# Patient Record
Sex: Male | Born: 1950 | Race: White | Hispanic: No | Marital: Married | Smoking: Never smoker
Health system: Southern US, Community
[De-identification: ages and names within clinical notes are randomized; demographics above are authoritative.]

## PROBLEM LIST (undated history)

## (undated) DIAGNOSIS — G4733 Obstructive sleep apnea (adult) (pediatric): Secondary | ICD-10-CM

## (undated) DIAGNOSIS — N529 Male erectile dysfunction, unspecified: Secondary | ICD-10-CM

## (undated) DIAGNOSIS — K635 Polyp of colon: Secondary | ICD-10-CM

## (undated) DIAGNOSIS — I712 Thoracic aortic aneurysm, without rupture: Secondary | ICD-10-CM

## (undated) DIAGNOSIS — I4891 Unspecified atrial fibrillation: Secondary | ICD-10-CM

## (undated) DIAGNOSIS — R55 Syncope and collapse: Secondary | ICD-10-CM

## (undated) DIAGNOSIS — T8859XA Other complications of anesthesia, initial encounter: Secondary | ICD-10-CM

## (undated) DIAGNOSIS — I82409 Acute embolism and thrombosis of unspecified deep veins of unspecified lower extremity: Secondary | ICD-10-CM

## (undated) DIAGNOSIS — T4145XA Adverse effect of unspecified anesthetic, initial encounter: Secondary | ICD-10-CM

## (undated) DIAGNOSIS — I700 Atherosclerosis of aorta: Secondary | ICD-10-CM

## (undated) DIAGNOSIS — K219 Gastro-esophageal reflux disease without esophagitis: Secondary | ICD-10-CM

## (undated) DIAGNOSIS — C61 Malignant neoplasm of prostate: Secondary | ICD-10-CM

## (undated) DIAGNOSIS — G4730 Sleep apnea, unspecified: Secondary | ICD-10-CM

## (undated) DIAGNOSIS — G4710 Hypersomnia, unspecified: Secondary | ICD-10-CM

## (undated) DIAGNOSIS — M542 Cervicalgia: Secondary | ICD-10-CM

## (undated) HISTORY — DX: Thoracic aortic aneurysm, without rupture: I71.2

## (undated) HISTORY — DX: Adverse effect of unspecified anesthetic, initial encounter: T41.45XA

## (undated) HISTORY — DX: Hyperlipidemia, unspecified: E78.5

## (undated) HISTORY — DX: Malignant neoplasm of prostate: C61

## (undated) HISTORY — DX: Hypersomnia, unspecified: G47.10

## (undated) HISTORY — DX: Syncope and collapse: R55

## (undated) HISTORY — DX: Sleep apnea, unspecified: G47.30

## (undated) HISTORY — DX: Essential (primary) hypertension: I10

## (undated) HISTORY — PX: OTHER SURGICAL HISTORY: SHX169

## (undated) HISTORY — PX: POLYPECTOMY: SHX149

## (undated) HISTORY — PX: COLONOSCOPY: SHX174

## (undated) HISTORY — DX: Other complications of anesthesia, initial encounter: T88.59XA

## (undated) HISTORY — PX: UPPER GASTROINTESTINAL ENDOSCOPY: SHX188

## (undated) HISTORY — DX: Atherosclerosis of aorta: I70.0

## (undated) HISTORY — DX: Male erectile dysfunction, unspecified: N52.9

## (undated) HISTORY — DX: Unspecified atrial fibrillation: I48.91

## (undated) HISTORY — PX: PLANTAR FASCIA SURGERY: SHX746

## (undated) HISTORY — DX: Cervicalgia: M54.2

## (undated) HISTORY — DX: Acute embolism and thrombosis of unspecified deep veins of unspecified lower extremity: I82.409

## (undated) HISTORY — DX: Obstructive sleep apnea (adult) (pediatric): G47.33

## (undated) HISTORY — DX: Polyp of colon: K63.5

---

## 1998-01-29 ENCOUNTER — Ambulatory Visit (HOSPITAL_COMMUNITY): Admission: RE | Admit: 1998-01-29 | Discharge: 1998-01-29 | Payer: Self-pay | Admitting: Family Medicine

## 2000-07-19 ENCOUNTER — Encounter: Payer: Self-pay | Admitting: Family Medicine

## 2000-07-19 LAB — CONVERTED CEMR LAB
Blood Glucose, Fasting: 96 mg/dL
PSA: 2 ng/mL
TSH: 1.73 microintl units/mL

## 2000-10-02 ENCOUNTER — Encounter (INDEPENDENT_AMBULATORY_CARE_PROVIDER_SITE_OTHER): Payer: Self-pay

## 2000-10-02 ENCOUNTER — Ambulatory Visit (HOSPITAL_COMMUNITY): Admission: RE | Admit: 2000-10-02 | Discharge: 2000-10-02 | Payer: Self-pay | Admitting: Gastroenterology

## 2000-10-02 ENCOUNTER — Other Ambulatory Visit: Admission: RE | Admit: 2000-10-02 | Discharge: 2000-10-02 | Payer: Self-pay | Admitting: Gastroenterology

## 2000-10-02 ENCOUNTER — Encounter: Payer: Self-pay | Admitting: Gastroenterology

## 2000-10-02 HISTORY — PX: ESOPHAGOGASTRODUODENOSCOPY: SHX1529

## 2003-07-24 ENCOUNTER — Observation Stay (HOSPITAL_COMMUNITY): Admission: EM | Admit: 2003-07-24 | Discharge: 2003-07-25 | Payer: Self-pay | Admitting: Emergency Medicine

## 2003-09-24 ENCOUNTER — Encounter: Payer: Self-pay | Admitting: Family Medicine

## 2003-09-24 LAB — CONVERTED CEMR LAB
Blood Glucose, Fasting: 88 mg/dL
PSA: 1.6 ng/mL
RBC count: 4.97 10*6/uL
TSH: 2.03 microintl units/mL
WBC, blood: 7.6 10*3/uL

## 2004-06-25 ENCOUNTER — Ambulatory Visit: Payer: Self-pay | Admitting: Family Medicine

## 2004-08-18 ENCOUNTER — Ambulatory Visit: Payer: Self-pay | Admitting: Family Medicine

## 2004-12-06 ENCOUNTER — Emergency Department (HOSPITAL_COMMUNITY): Admission: EM | Admit: 2004-12-06 | Discharge: 2004-12-06 | Payer: Self-pay | Admitting: Emergency Medicine

## 2004-12-06 IMAGING — CT CT ANGIO CHEST
1 of 3 series · 19 of 32 positions shown · IV contrast (APPLIED)
Comparison: Chest x-ray on the same day.  
 CT ANGIOGRAPHY OF CHEST:

CLINICAL DATA: 53-year-old with chest pain.  Evaluate for thoracic dissection.
TECHNIQUE: Multidetector CT imaging of the chest was performed during bolus injection of intravenous contrast.  Multiplanar CT angiographic image reconstructions were generated to evaluate the vascular anatomy.
 Contrast:  80 cc Omnipaque 300
 Vessels are well opacified and there is no evidence for a thoracic dissection or aneurysm.  The pulmonary arteries are also well opacified and there is no evidence for pulmonary embolus.  There is no mediastinal, hilar or axillary adenopathy.  Lung windows show no nodules, pleural effusions or infiltrates.  There is minimal bibasilar atelectasis but no evidence for interstitial fibrosis. The findings on the recent chest x-ray may be related to hypoinflation.  Images of the upper abdomen are unremarkable.

[Series 5: chest 1.0 b20f st · axial · 0.68mm/px · z∈[+937,+1210]mm · 19 of 300 slices shown]
[im 14/300  lung]
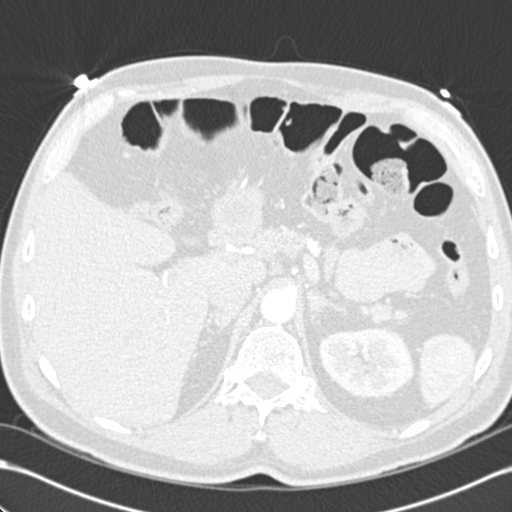
[im 27/300  soft-tissue]
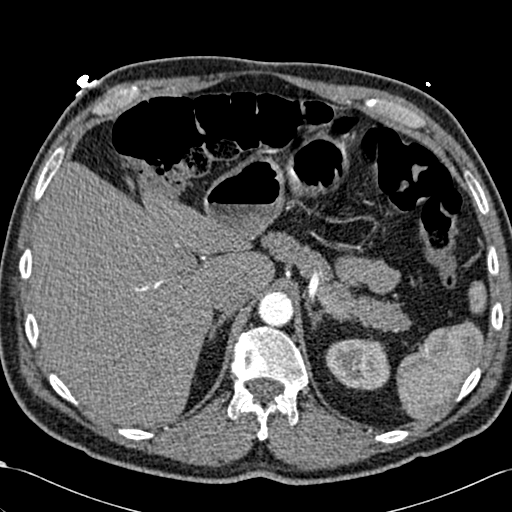
[im 40/300  lung]
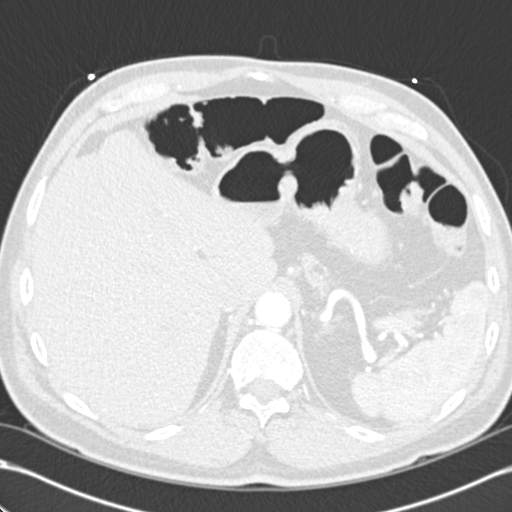
[im 66/300  soft-tissue]
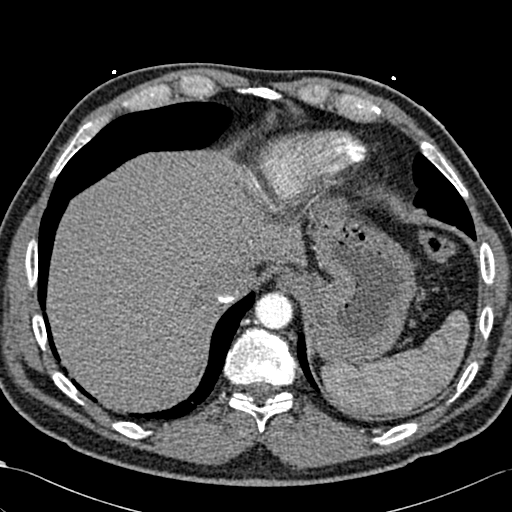
[im 79/300  lung]
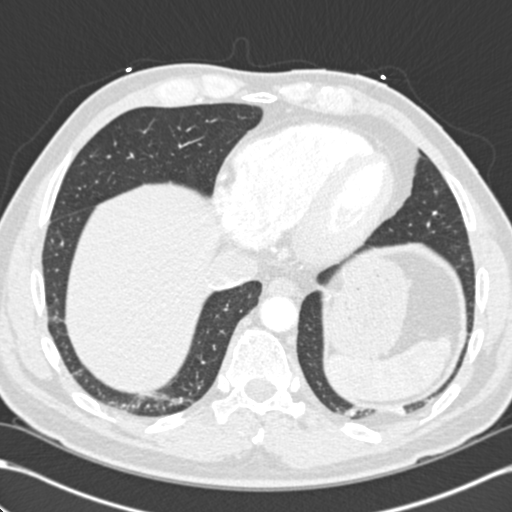
[im 92/300  soft-tissue]
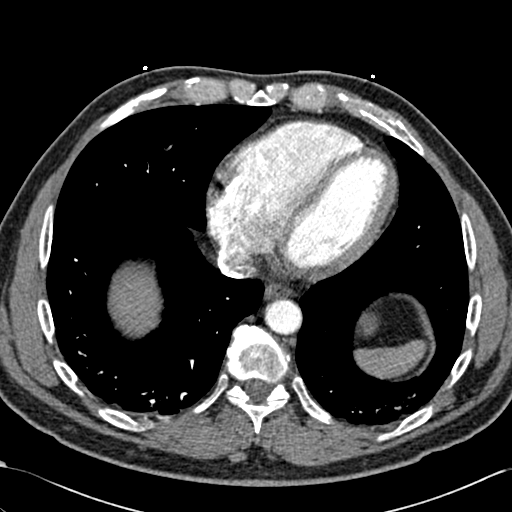
[im 105/300  lung]
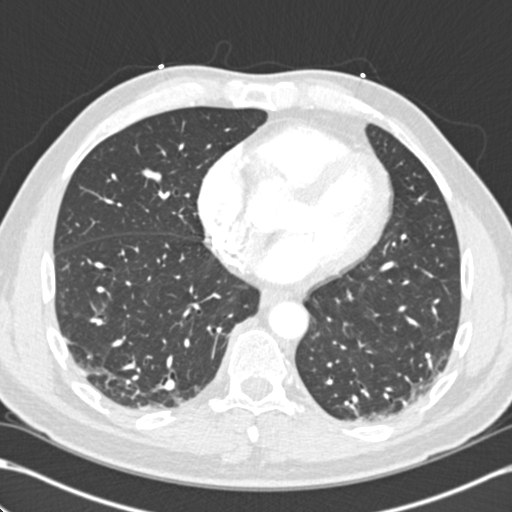
[im 118/300  soft-tissue]
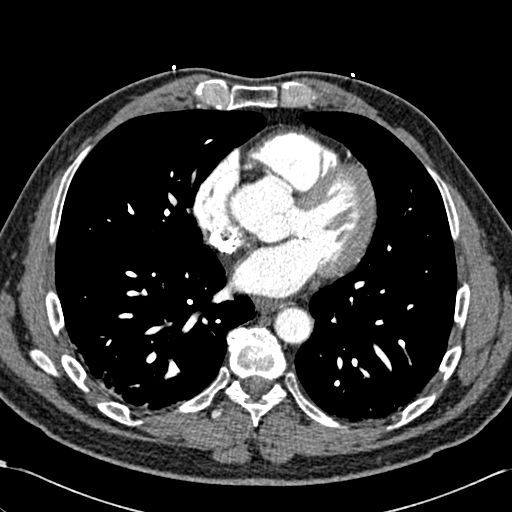
[im 131/300  lung]
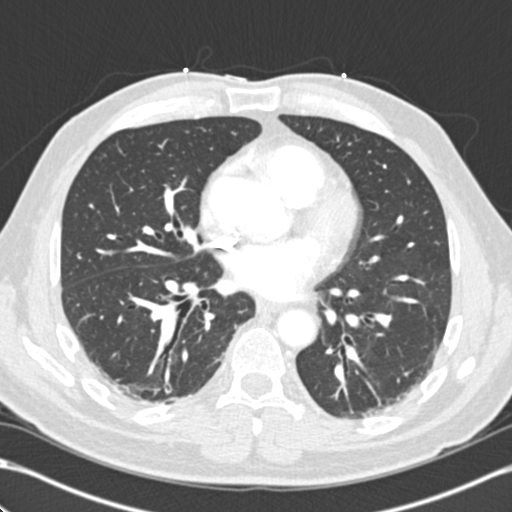
[im 157/300  soft-tissue]
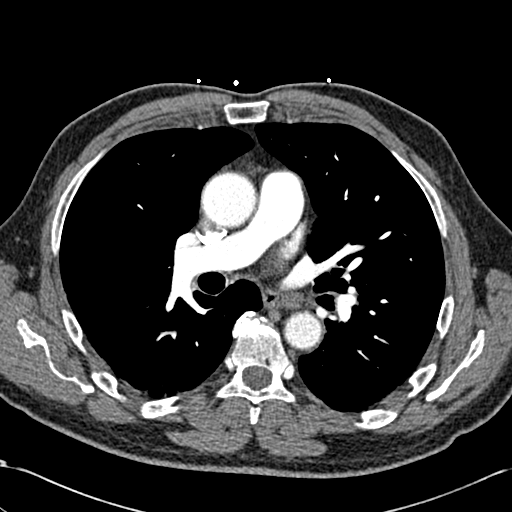
[im 170/300  lung]
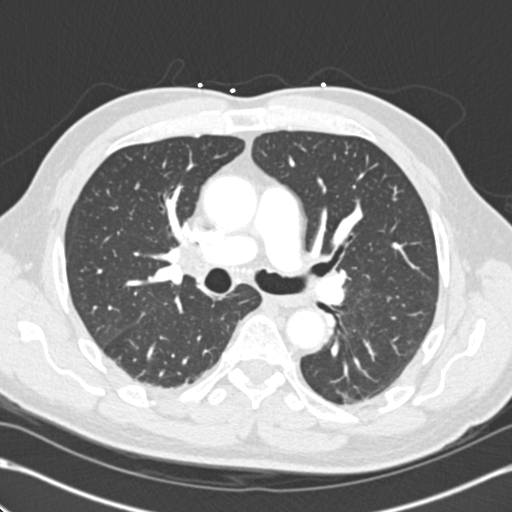
[im 183/300  soft-tissue]
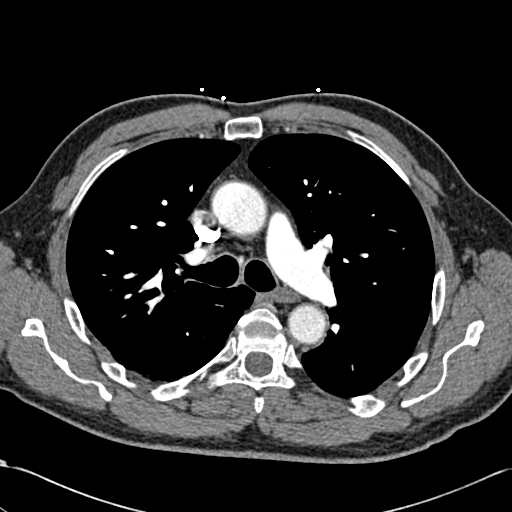
[im 196/300  lung]
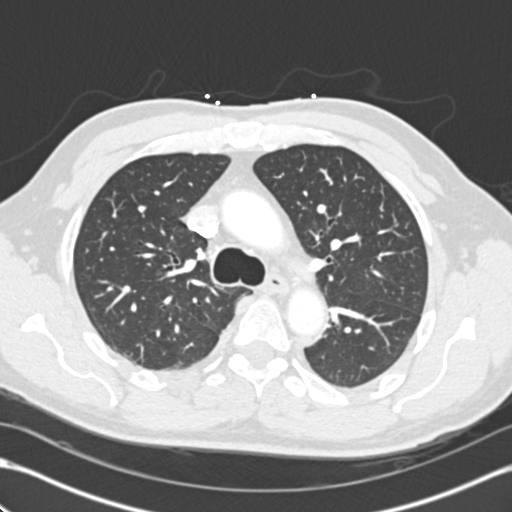
[im 209/300  soft-tissue]
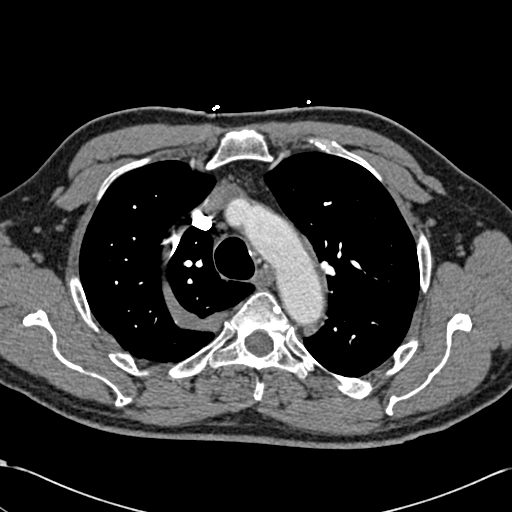
[im 222/300  lung]
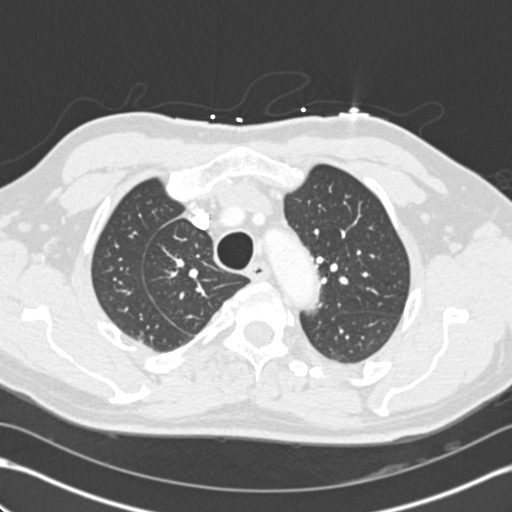
[im 235/300  soft-tissue]
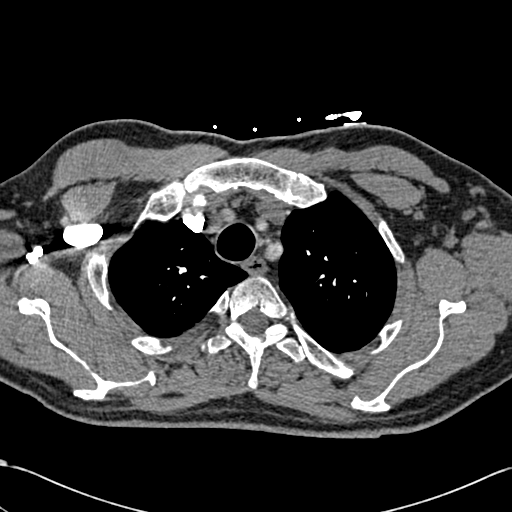
[im 261/300  lung]
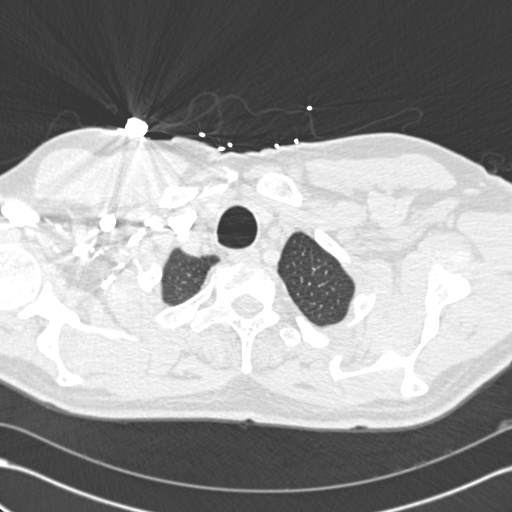
[im 274/300  soft-tissue]
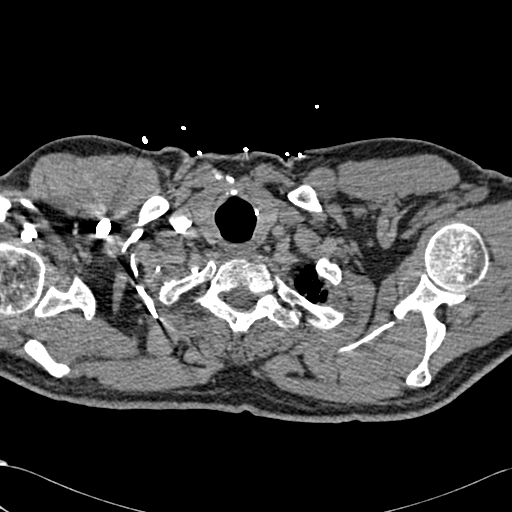
[im 287/300  lung]
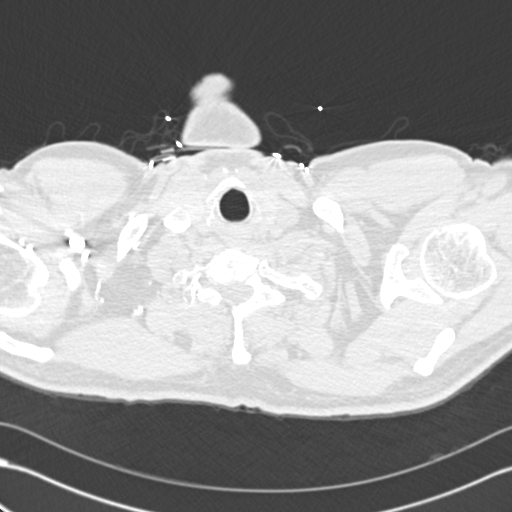

[19 of 32 positions shown; findings below may reference images not displayed]

IMPRESSION: No evidence for acute abnormality of the chest.

## 2004-12-06 IMAGING — CR DG CHEST 2V
2 series · 2 of 2 positions shown · non-contrast
Comparison: None.

CLINICAL DATA: Chest pain. Hypertension.

CHEST - 2 VIEW  [DATE]:

[view not recorded (1 of 2)]
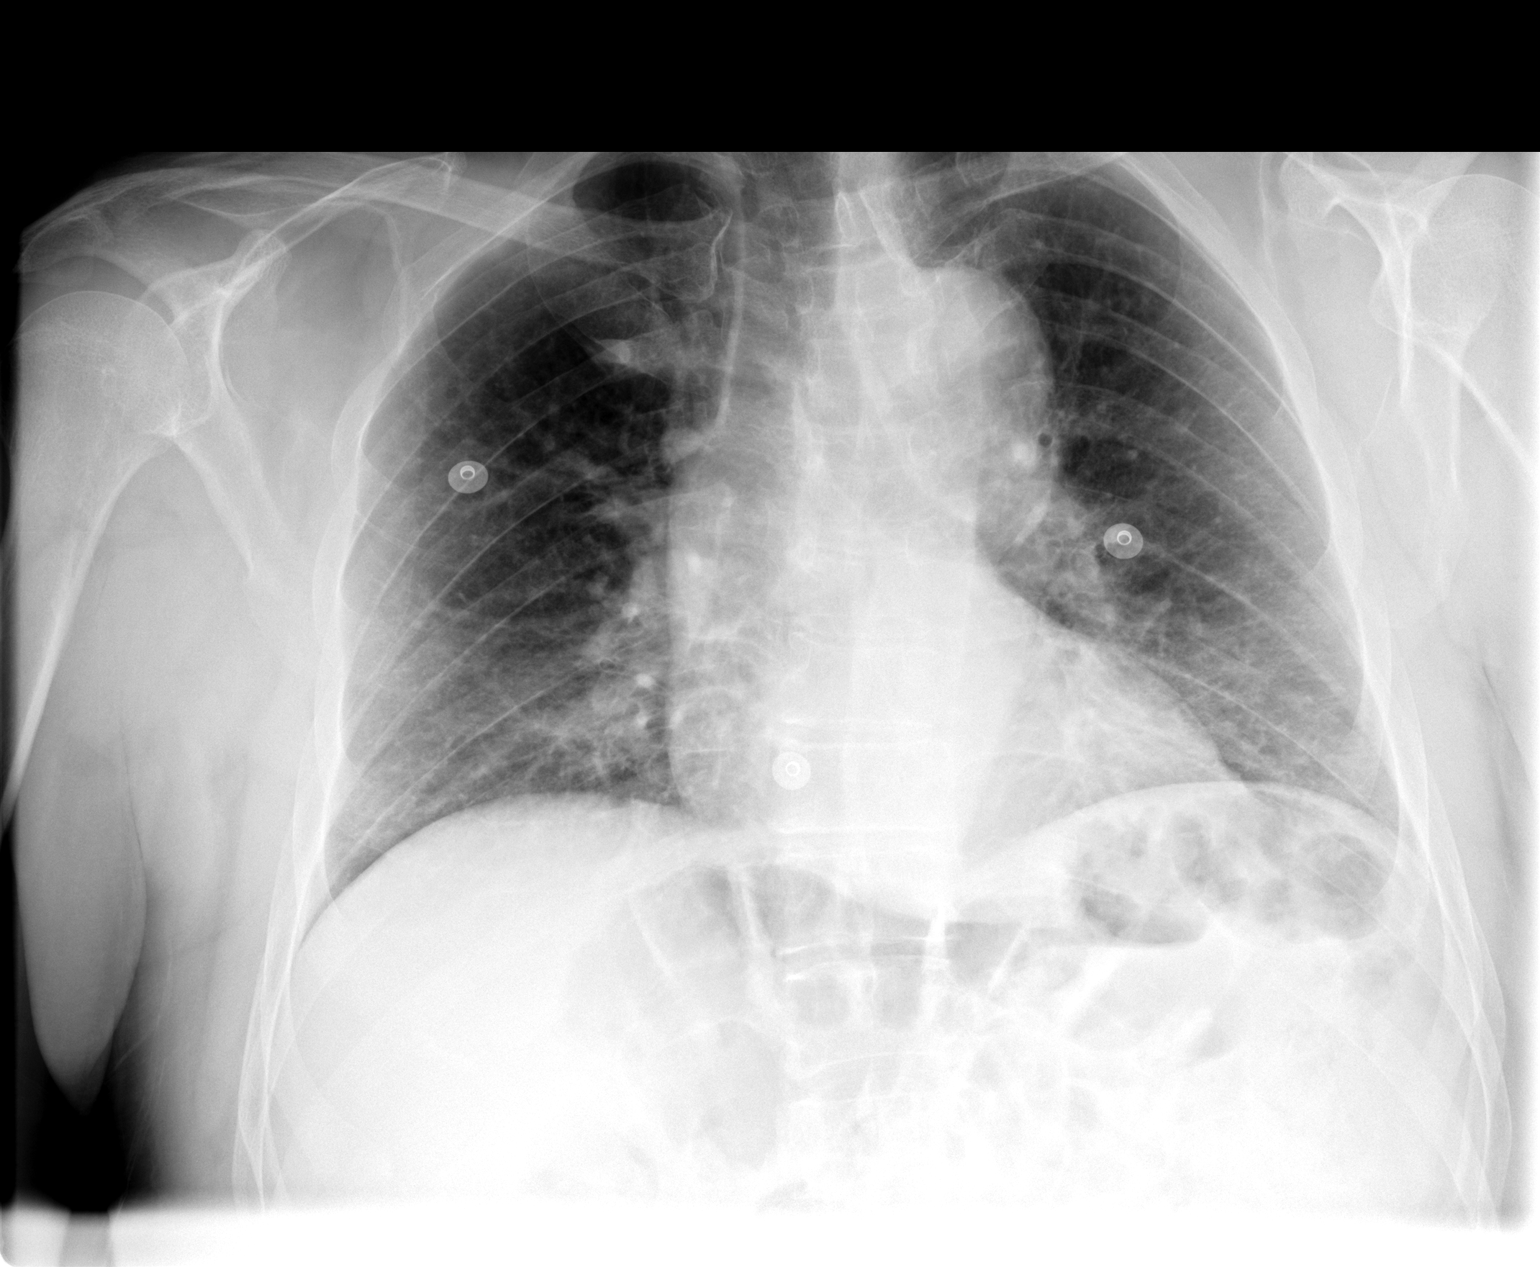

[view not recorded (2 of 2)]
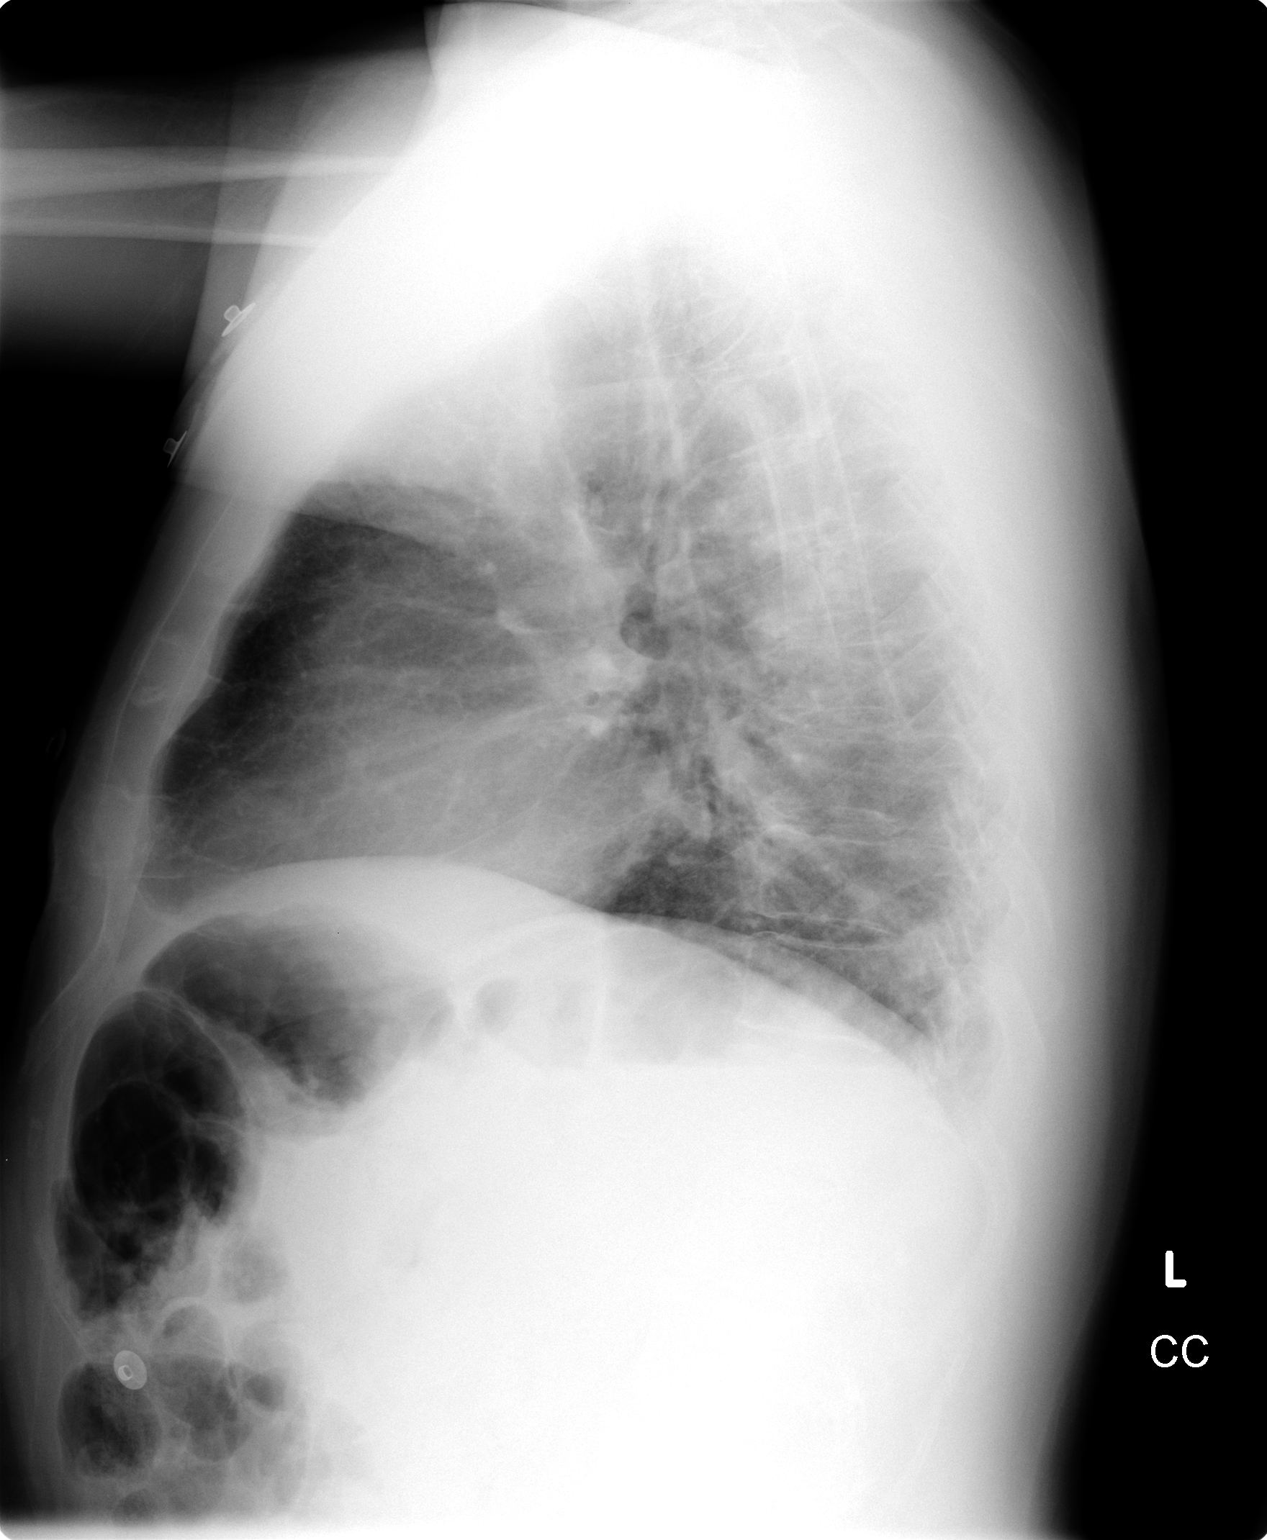

[2 of 2 positions shown; findings below may reference images not displayed]

FINDINGS: The heart size is upper normal. The lung volumes are low and there is
interstitial disease in both lower lobes. The thoracic aorta is tortuous,
ectatic and atherosclerotic. The hilar and mediastinal contours are otherwise
unremarkable. There are no pleural effusions. Degenerative changes are present
in the thoracic spine.
IMPRESSION: Interstitial disease in the lung bases associated with low lung volumes,
consistent with interstitial fibrosis.

## 2004-12-09 ENCOUNTER — Ambulatory Visit: Payer: Self-pay | Admitting: Family Medicine

## 2004-12-16 ENCOUNTER — Ambulatory Visit: Payer: Self-pay | Admitting: Family Medicine

## 2005-02-04 ENCOUNTER — Ambulatory Visit: Payer: Self-pay | Admitting: Family Medicine

## 2006-01-18 ENCOUNTER — Ambulatory Visit: Payer: Self-pay | Admitting: Family Medicine

## 2006-07-13 ENCOUNTER — Ambulatory Visit: Payer: Self-pay | Admitting: Family Medicine

## 2007-01-26 ENCOUNTER — Ambulatory Visit: Payer: Self-pay | Admitting: Family Medicine

## 2007-01-26 DIAGNOSIS — J309 Allergic rhinitis, unspecified: Secondary | ICD-10-CM | POA: Insufficient documentation

## 2007-01-26 DIAGNOSIS — N410 Acute prostatitis: Secondary | ICD-10-CM | POA: Insufficient documentation

## 2007-01-26 DIAGNOSIS — N529 Male erectile dysfunction, unspecified: Secondary | ICD-10-CM | POA: Insufficient documentation

## 2007-02-27 ENCOUNTER — Telehealth (INDEPENDENT_AMBULATORY_CARE_PROVIDER_SITE_OTHER): Payer: Self-pay | Admitting: Internal Medicine

## 2007-03-01 ENCOUNTER — Ambulatory Visit: Payer: Self-pay | Admitting: Family Medicine

## 2007-03-20 ENCOUNTER — Ambulatory Visit: Payer: Self-pay | Admitting: Family Medicine

## 2007-03-21 ENCOUNTER — Telehealth (INDEPENDENT_AMBULATORY_CARE_PROVIDER_SITE_OTHER): Payer: Self-pay | Admitting: *Deleted

## 2007-03-22 ENCOUNTER — Telehealth (INDEPENDENT_AMBULATORY_CARE_PROVIDER_SITE_OTHER): Payer: Self-pay | Admitting: *Deleted

## 2007-03-23 ENCOUNTER — Telehealth (INDEPENDENT_AMBULATORY_CARE_PROVIDER_SITE_OTHER): Payer: Self-pay | Admitting: *Deleted

## 2007-03-23 ENCOUNTER — Ambulatory Visit: Payer: Self-pay | Admitting: Family Medicine

## 2007-03-24 ENCOUNTER — Encounter: Payer: Self-pay | Admitting: Family Medicine

## 2007-03-27 ENCOUNTER — Encounter (INDEPENDENT_AMBULATORY_CARE_PROVIDER_SITE_OTHER): Payer: Self-pay | Admitting: *Deleted

## 2007-04-04 ENCOUNTER — Ambulatory Visit: Payer: Self-pay | Admitting: Family Medicine

## 2007-12-20 ENCOUNTER — Ambulatory Visit: Payer: Self-pay | Admitting: Family Medicine

## 2008-04-02 ENCOUNTER — Telehealth (INDEPENDENT_AMBULATORY_CARE_PROVIDER_SITE_OTHER): Payer: Self-pay | Admitting: Internal Medicine

## 2008-06-25 ENCOUNTER — Ambulatory Visit: Payer: Self-pay | Admitting: Family Medicine

## 2008-06-25 DIAGNOSIS — I10 Essential (primary) hypertension: Secondary | ICD-10-CM

## 2008-06-25 HISTORY — DX: Essential (primary) hypertension: I10

## 2008-07-21 ENCOUNTER — Ambulatory Visit: Payer: Self-pay | Admitting: Family Medicine

## 2008-07-22 LAB — CONVERTED CEMR LAB
ALT: 13 units/L (ref 0–53)
AST: 18 units/L (ref 0–37)
Albumin: 3.8 g/dL (ref 3.5–5.2)
Alkaline Phosphatase: 105 units/L (ref 39–117)
BUN: 13 mg/dL (ref 6–23)
Bilirubin, Direct: 0.1 mg/dL (ref 0.0–0.3)
CO2: 30 meq/L (ref 19–32)
Calcium: 9.9 mg/dL (ref 8.4–10.5)
Chloride: 104 meq/L (ref 96–112)
Cholesterol: 219 mg/dL (ref 0–200)
Creatinine, Ser: 1.2 mg/dL (ref 0.4–1.5)
Direct LDL: 160 mg/dL
GFR calc Af Amer: 80 mL/min
GFR calc non Af Amer: 66 mL/min
Glucose, Bld: 102 mg/dL — ABNORMAL HIGH (ref 70–99)
HDL: 43.3 mg/dL (ref >39.0)
PSA: 2.04 ng/mL (ref 0.10–4.00)
Potassium: 5.1 meq/L (ref 3.5–5.1)
Sodium: 140 meq/L (ref 135–145)
Total Bilirubin: 0.9 mg/dL (ref 0.3–1.2)
Total CHOL/HDL Ratio: 5.1
Total Protein: 7.2 g/dL (ref 6.0–8.3)
Triglycerides: 81 mg/dL (ref 0–149)
VLDL: 16 mg/dL (ref 0–40)

## 2008-07-23 ENCOUNTER — Ambulatory Visit: Payer: Self-pay | Admitting: Family Medicine

## 2008-07-24 ENCOUNTER — Encounter: Payer: Self-pay | Admitting: Family Medicine

## 2008-07-24 DIAGNOSIS — E785 Hyperlipidemia, unspecified: Secondary | ICD-10-CM

## 2008-07-24 HISTORY — DX: Hyperlipidemia, unspecified: E78.5

## 2008-08-22 ENCOUNTER — Ambulatory Visit: Payer: Self-pay | Admitting: Family Medicine

## 2008-08-22 LAB — CONVERTED CEMR LAB
OCCULT 1: NEGATIVE
OCCULT 2: NEGATIVE
OCCULT 3: NEGATIVE

## 2008-08-25 ENCOUNTER — Encounter (INDEPENDENT_AMBULATORY_CARE_PROVIDER_SITE_OTHER): Payer: Self-pay | Admitting: *Deleted

## 2008-12-19 ENCOUNTER — Ambulatory Visit: Payer: Self-pay | Admitting: Family Medicine

## 2009-02-12 ENCOUNTER — Ambulatory Visit: Payer: Self-pay | Admitting: Family Medicine

## 2009-02-12 DIAGNOSIS — H6120 Impacted cerumen, unspecified ear: Secondary | ICD-10-CM

## 2009-07-23 ENCOUNTER — Telehealth: Payer: Self-pay | Admitting: Family Medicine

## 2009-08-13 ENCOUNTER — Ambulatory Visit: Payer: Self-pay | Admitting: Family Medicine

## 2009-08-13 DIAGNOSIS — J069 Acute upper respiratory infection, unspecified: Secondary | ICD-10-CM | POA: Insufficient documentation

## 2009-08-27 ENCOUNTER — Ambulatory Visit: Payer: Self-pay | Admitting: Family Medicine

## 2009-08-27 DIAGNOSIS — K644 Residual hemorrhoidal skin tags: Secondary | ICD-10-CM | POA: Insufficient documentation

## 2009-09-04 ENCOUNTER — Telehealth: Payer: Self-pay | Admitting: Family Medicine

## 2009-10-08 ENCOUNTER — Telehealth: Payer: Self-pay | Admitting: Family Medicine

## 2010-02-08 ENCOUNTER — Encounter: Payer: Self-pay | Admitting: Family Medicine

## 2010-03-23 ENCOUNTER — Encounter (INDEPENDENT_AMBULATORY_CARE_PROVIDER_SITE_OTHER): Payer: Self-pay | Admitting: *Deleted

## 2010-07-05 ENCOUNTER — Encounter (INDEPENDENT_AMBULATORY_CARE_PROVIDER_SITE_OTHER): Payer: Self-pay | Admitting: *Deleted

## 2010-07-05 ENCOUNTER — Ambulatory Visit: Payer: Self-pay | Admitting: Family Medicine

## 2010-07-05 DIAGNOSIS — R21 Rash and other nonspecific skin eruption: Secondary | ICD-10-CM

## 2010-08-04 ENCOUNTER — Telehealth: Payer: Self-pay | Admitting: Family Medicine

## 2010-08-11 ENCOUNTER — Encounter (INDEPENDENT_AMBULATORY_CARE_PROVIDER_SITE_OTHER): Payer: Self-pay | Admitting: *Deleted

## 2010-08-12 ENCOUNTER — Ambulatory Visit: Payer: Self-pay | Admitting: Gastroenterology

## 2010-08-26 ENCOUNTER — Encounter: Payer: Self-pay | Admitting: Gastroenterology

## 2010-08-26 ENCOUNTER — Ambulatory Visit
Admission: RE | Admit: 2010-08-26 | Discharge: 2010-08-26 | Payer: Self-pay | Source: Home / Self Care | Attending: Gastroenterology | Admitting: Gastroenterology

## 2010-08-31 ENCOUNTER — Encounter: Payer: Self-pay | Admitting: Gastroenterology

## 2010-09-14 NOTE — Letter (Signed)
Summary: Nadara Eaton letter  [facility]  [address] [place], [place] [address]   Phone: [phone]  Fax: [phone]       03/23/2010 MRN: [id number]  DARTANYON FRANKOWSKI [address] [place], [place]  [address]  Dear Mr. GOLDFARB,  [facility] [place], and [facility] announce the retirement of Arta Silence, M.D., from full-time practice at the [facility] office effective February 11, 2010 and his plans of returning part-time.  It is important to Dr. Hetty Ely and to our practice that you understand that [facility] has seven physicians in our office for your health care needs.  We will continue to offer the same exceptional care that you have today.    Dr. Hetty Ely has spoken to many of you about his plans for retirement and returning part-time in the fall.   We will continue to work with you through the transition to schedule appointments for you in the office and meet the high standards that [facility] is committed to.   Again, it is with great pleasure that we share the news that Dr. Hetty Ely will return to [facility] in October of 2011 with a reduced schedule.    If you have any questions, or would like to request an appointment with one of our physicians, please call us at [phone] and press the option for Scheduling an appointment.  We take pleasure in providing you with excellent patient care and look forward to seeing you at your next office visit.  Our [facility] Physicians are:  Tillman Abide, M.D. Laurita Quint, M.D. Roxy Manns, M.D. Kerby Nora, M.D. Hannah Beat, M.D. Ruthe Mannan, M.D. We proudly welcomed Raechel Ache, M.D. and Eustaquio Boyden, M.D. to the practice in July/August 2011.  Sincerely,  [facility]

## 2010-09-14 NOTE — Letter (Signed)
Summary: Pre Visit Letter Revised  [facility]  [address] [place], [place] [address]   Phone: [phone]  Fax: [phone]        07/05/2010 MRN: [id number] Howard [name] [address] [place], [place]  [address]             Procedure Date:  08-26-10   Welcome to the Gastroenterology Division at [facility]    You are scheduled to see a nurse for your pre-procedure visit on 08-12-10 at 8:30a.m. on the 3rd floor at [facility], [address]  We ask that you try to arrive at our office 15 minutes prior to your appointment time to allow for check-in.  Please take a minute to review the attached form.  If you answer "Yes" to one or more of the questions on the first page, we ask that you call the person listed at your earliest opportunity.  If you answer "No" to all of the questions, please complete the rest of the form and bring it to your appointment.    Your nurse visit will consist of discussing your medical and surgical history, your immediate family medical history, and your medications.   If you are unable to list all of your medications on the form, please bring the medication bottles to your appointment and we will list them.  We will need to be aware of both prescribed and over the counter drugs.  We will need to know exact dosage information as well.    Please be prepared to read and sign documents such as consent forms, a financial agreement, and acknowledgement forms.  If necessary, and with your consent, a friend or relative is welcome to sit-in on the nurse visit with you.  Please bring your insurance card so that we may make a copy of it.  If your insurance requires a referral to see a specialist, please bring your referral form from your primary care physician.  No co-pay is required for this nurse visit.     If you cannot keep your appointment, please call [phone] to cancel or reschedule prior to your appointment date.  This  allows [place] the opportunity to schedule an appointment for another patient in need of care.    Thank you for choosing [facility] for your medical needs.  We appreciate the opportunity to care for you.  Please visit [place] at our website  to learn more about our practice.  Sincerely, The Gastroenterology Division

## 2010-09-14 NOTE — Assessment & Plan Note (Signed)
Summary: ?SHINGLES/CLE   Vital Signs:  Patient profile:   60 year old male Height:      69 inches Weight:      198.75 pounds BMI:     29.46 Temp:     98.9 degrees F oral Pulse rate:   80 / minute Pulse rhythm:   regular BP sitting:   112 / 70  (left arm) Cuff size:   large  Vitals Entered By: Delilah Shan CMA Duncan Dull) (July 05, 2010 2:56 PM) CC: ? shingles   History of Present Illness: Rash on back, going on for 1 week.  itching on back.  Bilateral.  No pain currently. No FCNAVD. No other complaints.    Due for shingles shot.   Never had colonoscopy.  We discussed this today.   Allergies: 1)  ! Bl Ibuprofen  Review of Systems       See HPI.  Otherwise negative.    Physical Exam  General:  no apparent distress back w/o rash or excoration.  lower back not tender to palpation    Impression & Recommendations:  Problem # 1:  SCREENING, COLON CANCER (ICD-V76.51) refer to GI Orders: Gastroenterology Referral (GI)  Problem # 2:  SKIN RASH (ICD-782.1) resolved, unclear source.  Would use otc tx for itching as needed.  follow up as needed.  Not c/w shingles, okay to give vaccine today.   Complete Medication List: 1)  Atacand 16 Mg Tabs (Candesartan cilexetil) .... 1/2 tab every day 2)  Zegerid 40-1100 Mg Caps (Omeprazole-sodium bicarbonate) .Marland Kitchen.. 1 daily by mouth 3)  Cialis 20 Mg Tabs (Tadalafil) .... As needed 4)  Anusol-hc 2.5 % Crea (Hydrocortisone) .... Apply to rectum prn  Other Orders: Zoster (Shingles) Vaccine Live [phone]) Admin 1st Vaccine ([address]8119)  Patient Instructions: 1)  If the itch/rash continues, I would use over the counter hydrocortisone as needed.  Let us know if it continues.   2)  See Shirlee Limerick about your referral before your leave today.   3)  Check with your insurance to see if they will cover the shingles shot.     Orders Added: 1)  Est. Patient Level III [[address]] 2)  Gastroenterology Referral [GI] 3)  Zoster (Shingles) Vaccine Live  [90736] 4)  Admin 1st Vaccine [90471]   Immunizations Administered:  Zostavax # 1:    Vaccine Type: Zostavax    Site: Left arm    Mfr: Merck    Dose: 0.5 ml    Route:     Given by: Delilah Shan CMA (AAMA)    Exp. Date: 04/02/2011    Lot #: [id number]    VIS given: 05/27/05 given July 05, 2010.   Immunizations Administered:  Zostavax # 1:    Vaccine Type: Zostavax    Site: Left arm    Mfr: Merck    Dose: 0.5 ml    Route:     Given by: Delilah Shan CMA (AAMA)    Exp. Date: 04/02/2011    Lot #: [id number]    VIS given: 05/27/05 given July 05, 2010.  Current Allergies (reviewed today): ! BL IBUPROFEN

## 2010-09-14 NOTE — Letter (Signed)
Summary: [place] Concealed [place]gun Permits Form  [place] Concealed [employer]gun Permits Form   Imported By: Beau Fanny 02/08/2010 09:58:52  _____________________________________________________________________  External Attachment:    Type:   Image     Comment:   External Document

## 2010-09-14 NOTE — Assessment & Plan Note (Signed)
Summary: hemorrhoids//[facility]   Vital Signs:  Patient profile:   60 year old male Height:      69 inches Weight:      206.13 pounds BMI:     30.55 Temp:     98.3 degrees F oral Pulse rate:   80 / minute Pulse rhythm:   regular BP sitting:   138 / 84  (left arm) Cuff size:   large  Vitals Entered By: Delilah Shan CMA Duncan Dull) (August 27, 2009 3:46 PM) CC: Hemorrhoids   History of Present Illness: 60 yo with painful hemorrhoid for past two days. Has had them before but never this painful. Tried to push it back in but it would not go back. Is sometimes constipated, but not lately. Trying OTC preparation H with only mild relief of symptoms.  Has had no abdominal pain, no blood in stool.  Current Medications (verified): 1)  Atacand 16 Mg Tabs (Candesartan Cilexetil) .... 1/2 Tab Every Day 2)  Zegerid 40-1100 Mg Caps (Omeprazole-Sodium Bicarbonate) .Marland Kitchen.. 1 Daily By Mouth 3)  Cialis 20 Mg  Tabs (Tadalafil) .... As Needed 4)  Anusol-Hc 2.5 % Crea (Hydrocortisone) .... Apply To Rectum Prn  Allergies: 1)  ! Bl Ibuprofen  Review of Systems      See HPI GI:  Complains of constipation; denies abdominal pain, bloody stools, change in bowel habits, and dark tarry stools.  Physical Exam  General:  alert, well-developed, well-nourished, and well-hydrated Rectal:  external hemorrhoid, not thrombosed, very painful to touch, unable to reduce.   mildly hemocult positive Psych:  Cognition and judgment appear intact. Alert and cooperative with normal attention span and concentration. No apparent delusions, illusions, hallucinations   Impression & Recommendations:  Problem # 1:  EXTERNAL HEMORRHOIDS (ICD-455.3) Assessment New Not thrombosed.  Will use conservative measures.  Given handout about treatment, try sitz bath and anusol suppositories.  Complete Medication List: 1)  Atacand 16 Mg Tabs (Candesartan cilexetil) .... 1/2 tab every day 2)  Zegerid 40-1100 Mg Caps (Omeprazole-sodium  bicarbonate) .Marland Kitchen.. 1 daily by mouth 3)  Cialis 20 Mg Tabs (Tadalafil) .... As needed 4)  Anusol-hc 2.5 % Crea (Hydrocortisone) .... Apply to rectum prn Prescriptions: ANUSOL-HC 2.5 % CREA (HYDROCORTISONE) apply to rectum prn  #30 grams x 1   Entered and Authorized by:   Ruthe Mannan MD   Signed by:   Ruthe Mannan MD on 08/27/2009   Method used:   [facility]        [facility] [facility]       [address]       [place], [place]  [address]       Ph: [phone] or [phone]       Fax: [phone]   RxID:   [id number]   Current Allergies (reviewed today): ! BL IBUPROFEN

## 2010-09-14 NOTE — Progress Notes (Signed)
Summary: Alternative medication for Atacand  Phone Note From Pharmacy   [facility] [facility] Call For: Dr. Dayton Martes  Summary of Call: Received faxed form from pharmacy request alternative medication for Atacand 16mg .  Form in your In box Initial call taken by: Linde Gillis CMA Duncan Dull),  September 04, 2009 10:18 AM  Follow-up for Phone Call        Completed, in my box. Follow-up by: Ruthe Mannan MD,  September 04, 2009 10:56 AM

## 2010-09-14 NOTE — Progress Notes (Signed)
Summary: wants to change script back to atacand  Phone Note Call from Patient Call back at Home Phone [phone]   Caller: Patient Call For: Shaune Leeks MD Summary of Call: Pt states his atacand script was changed without his knowledge, he is asking to go back on atacand.  [facility] back in january, but apparently they didnt tell the pt they were going to.  [facility] Initial call taken by: Lowella Petties CMA,  October 08, 2009 2:54 PM  Follow-up for Phone Call        Advised pt, left message on voice mail. Follow-up by: Lowella Petties CMA,  October 08, 2009 5:10 PM    Prescriptions: ATACAND 16 MG TABS (CANDESARTAN CILEXETIL) 1/2 tab every day  #30 Tablet x 12   Entered and Authorized by:   Shaune Leeks MD   Signed by:   Shaune Leeks MD on 10/08/2009   Method used:   [facility]        [facility] ([facility]ail[facility]       [address]       [place], [place]  [address]       Ph: [phone] or [phone]       Fax: [phone]   RxID:   [id number]

## 2010-09-16 NOTE — Letter (Signed)
Summary: [facility] Instructions  [facility]  [address] [place], [place] [address]   Phone: [phone]  Fax: [phone]       CRISTOVAL TEALL    Dec 14, 1950    MRN: [id number]        Procedure Day Dorna Bloom:  Lenor Coffin  08/26/10     Arrival Time:  9:00AM     Procedure Time:  10:00AM     Location of Procedure:                    _ X_  [facility] (4th Floor)  PREPARATION FOR COLONOSCOPY WITH MOVIPREP   Starting 5 days prior to your procedure 08/21/10 do not eat nuts, seeds, popcorn, corn, beans, peas,  salads, or any raw vegetables.  Do not take any fiber supplements (e.g. Metamucil, Citrucel, and Benefiber).  THE DAY BEFORE YOUR PROCEDURE         DATE: 08/25/10  DAY: WEDNESDAY  1.  Drink clear liquids the entire day-NO SOLID FOOD  2.  Do not drink anything colored red or purple.  Avoid juices with pulp.  No orange juice.  3.  Drink at least 64 oz. (8 glasses) of fluid/clear liquids during the day to prevent dehydration and help the prep work efficiently.  CLEAR LIQUIDS INCLUDE: Water Jello Ice Popsicles Tea (sugar ok, no milk/cream) Powdered fruit flavored drinks Coffee (sugar ok, no milk/cream) Gatorade Juice: apple, white grape, white cranberry  Lemonade Clear bullion, consomm, broth Carbonated beverages (any kind) Strained chicken noodle soup Hard Candy                             4.  In the morning, mix first dose of MoviPrep solution:    Empty 1 Pouch A and 1 Pouch B into the disposable container    Add lukewarm drinking water to the top line of the container. Mix to dissolve    Refrigerate (mixed solution should be used within 24 hrs)  5.  Begin drinking the prep at 5:00 p.m. The MoviPrep container is divided by 4 marks.   Every 15 minutes drink the solution down to the next mark (approximately 8 oz) until the full liter is complete.   6.  Follow completed prep with 16 oz of clear liquid of your choice (Nothing red or purple).   Continue to drink clear liquids until bedtime.  7.  Before going to bed, mix second dose of MoviPrep solution:    Empty 1 Pouch A and 1 Pouch B into the disposable container    Add lukewarm drinking water to the top line of the container. Mix to dissolve    Refrigerate  THE DAY OF YOUR PROCEDURE      DATE: 08/26/10  DAY: THURSDAY  Beginning at 5:00AM (5 hours before procedure):         1. Every 15 minutes, drink the solution down to the next mark (approx 8 oz) until the full liter is complete.  2. Follow completed prep with 16 oz. of clear liquid of your choice.    3. You may drink clear liquids until 8:00AM (2 HOURS BEFORE PROCEDURE).   MEDICATION INSTRUCTIONS  Unless otherwise instructed, you should take regular prescription medications with a small sip of water   as early as possible the morning of your procedure.         OTHER INSTRUCTIONS  You will need a responsible adult at least  61 years of age to accompany you and drive you home.   This person must remain in the waiting room during your procedure.  Wear loose fitting clothing that is easily removed.  Leave jewelry and other valuables at home.  However, you may wish to bring a book to read or  an iPod/MP3 player to listen to music as you wait for your procedure to start.  Remove all body piercing jewelry and leave at home.  Total time from sign-in until discharge is approximately 2-3 hours.  You should go home directly after your procedure and rest.  You can resume normal activities the  day after your procedure.  The day of your procedure you should not:   Drive   Make legal decisions   Operate machinery   Drink alcohol   Return to work  You will receive specific instructions about eating, activities and medications before you leave.    The above instructions have been reviewed and explained to me by   Karl Bales RN  August 12, 2010 8:35 AM    I fully understand and can verbalize  these instructions _____________________________ Date _________

## 2010-09-16 NOTE — Progress Notes (Signed)
Summary: Rx Cialis  Phone Note Refill Request Call back at [phone] Message [facility]  [facility] on August 04, 2010 2:43 PM  Refills Requested: Medication #1:  CIALIS 20 MG  TABS as needed   Last Refilled: 07/23/2009 Received faxed refill request please advise.   Method Requested: Telephone to Pharmacy Initial call taken by: Linde Gillis CMA Duncan Dull),  August 04, 2010 2:45 PM  Follow-up for Phone Call        Rx called to pharmacy Follow-up by: Linde Gillis CMA Duncan Dull),  August 04, 2010 2:59 PM    Prescriptions: CIALIS 20 MG  TABS (TADALAFIL) as needed  #5 x 12   Entered and Authorized by:   Shaune Leeks MD   Signed by:   Shaune Leeks MD on 08/04/2010   Method used:   [facility]        [facility] [address] [facility]       [address]       [place], [place]  [address]       Ph: [phone] or [phone]       Fax: [phone]   RxID:   [id number]

## 2010-09-16 NOTE — Procedures (Addendum)
Summary: Colonoscopy  Patient: Howard [name] Note: All result statuses are Final unless otherwise noted.  Tests: (1) Colonoscopy (COL)   COL Colonoscopy           DONE     [facility]     [address]     [place], [place]  [address]           COLONOSCOPY PROCEDURE REPORT     PATIENT:  Howard [name], Howard [name]  MR#:  [id number]     BIRTHDATE:  1951-02-18, 59 yrs. old  GENDER:  male     ENDOSCOPIST:  Judie Petit T. Russella Dar, MD, [facility]     Referred by:  Crawford Givens, M.D.     PROCEDURE DATE:  08/26/2010     PROCEDURE:  Colonoscopy with snare polypectomy     ASA CLASS:  Class II     INDICATIONS:  1) Routine Risk Screening     MEDICATIONS:   Fentanyl 100 mcg IV, Versed 10 mg IV     DESCRIPTION OF PROCEDURE:   After the risks benefits and     alternatives of the procedure were thoroughly explained, informed     consent was obtained.  Digital rectal exam was performed and     revealed no abnormalities.   The LB [id number] endoscope     was introduced through the anus and advanced to the cecum, which     was identified by both the appendix and ileocecal valve, without     limitations.  The quality of the prep was excellent, using     MoviPrep.  The instrument was then slowly withdrawn as the colon     was fully examined.     <<PROCEDUREIMAGES>>     FINDINGS:  A sessile polyp was found in the mid transverse colon.     It was 5 mm in size. Polyp was snared without cautery. Retrieval     was successful.  A sessile polyp was found in the sigmoid colon.     It was 6 mm in size. Polyp was snared without cautery. Retrieval     was successful. A sessile polyp was found in the rectum. It was 4     mm in size. Polyp was snared without cautery. Retrieval was     successful. A normal appearing cecum, ileocecal valve, and     appendiceal orifice were identified. The ascending, hepatic     flexure, splenic flexure, descending appeared unremarkable.     Retroflexed views in the rectum revealed no  abnormalities. The time     to cecum =  3.33  minutes. The scope was then withdrawn (time =     13.5  min) from the patient and the procedure completed.           COMPLICATIONS:  None           ENDOSCOPIC IMPRESSION:     1) 5 mm sessile polyp in the mid transverse colon     2) 6 mm sessile polyp in the sigmoid colon     3) 4 mm sessile polyp in the rectum           RECOMMENDATIONS:     1) If the polyps are adenomatous (precancerous), colonoscopy in     5 years. If the polyps are not precancerous routine colorectal     cancer screening with colonoscopy in 10 year.           Venita Lick. Russella Dar, MD, [facility] Graham  n.     eSIGNED:   Marcene Laskowski T. Saira Kramme at 08/26/2010 10:35 AM           Neale Burly, [id number]  Note: An exclamation mark (!) indicates a result that was not dispersed into the flowsheet. Document Creation Date: 08/26/2010 10:35 AM _______________________________________________________________________  (1) Order result status: Final Collection or observation date-time: 08/26/2010 10:28 Requested date-time:  Receipt date-time:  Reported date-time:  Referring Physician:   Ordering Physician: Claudette Head [phone]) Specimen Source:  Source: Launa Grill Order Number: [id number] Lab site:   Appended Document: Colonoscopy recall     Procedures Next Due Date:    Colonoscopy: 08/2015

## 2010-09-16 NOTE — Letter (Addendum)
Summary: Patient Notice- Polyp Results  [facility]  [address] [place], [place] [address]   Phone: [phone]  Fax: [phone]        August 31, 2010 MRN: [id number]    TOSHIO SLUSHER [address] [place], [place]  [address]    Dear Mr. DOCTER,  I am pleased to inform you that the colon polyps removed during your recent colonoscopy were found to be benign (no cancer detected) upon pathologic examination.  I recommend you have a repeat colonoscopy examination in 5 years to look for recurrent polyps, as having colon polyps increases your risk for having recurrent polyps or even colon cancer in the future.  Should you develop new or worsening symptoms of abdominal pain, bowel habit changes or bleeding from the rectum or bowels, please schedule an evaluation with either your primary care physician or with me.  Continue treatment plan as outlined the day of your exam.  Please call us if you are having persistent problems or have questions about your condition that have not been fully answered at this time.  Sincerely,  Meryl Dare MD [facility]  This letter has been electronically signed by your physician.  Appended Document: Patient Notice- Polyp Results Letter mailed

## 2010-09-16 NOTE — Miscellaneous (Signed)
Summary: [facility] previsit  Clinical Lists Changes  Medications: Added new medication of MOVIPREP 100 GM  SOLR (PEG-KCL-NACL-NASULF-NA ASC-C) As per prep instructions. - Signed Rx of MOVIPREP 100 GM  SOLR (PEG-KCL-NACL-NASULF-NA ASC-C) As per prep instructions.;  #1 x 0;  Signed;  Entered by: Karl Bales RN;  Authorized by: Meryl Dare MD [facility];  Method used: [facility]  [address] [place], [place]  [address], Ph: [phone] or [phone], Fax: [phone]    Prescriptions: MOVIPREP 100 GM  SOLR (PEG-KCL-NACL-NASULF-NA ASC-C) As per prep instructions.  #1 x 0   Entered by:   Karl Bales RN   Authorized by:   Meryl Dare MD [facility]   Signed by:   Karl Bales RN on 08/12/2010   Method used:   [facility]        [facility]  [address] (retail)       [address]       [place], [place]  [address]       Ph: [phone] or [phone]       Fax: [phone]   RxID:   [id number]

## 2010-11-23 ENCOUNTER — Other Ambulatory Visit: Payer: Self-pay | Admitting: Family Medicine

## 2010-12-24 ENCOUNTER — Other Ambulatory Visit: Payer: Self-pay | Admitting: Family Medicine

## 2010-12-24 DIAGNOSIS — I10 Essential (primary) hypertension: Secondary | ICD-10-CM

## 2010-12-24 NOTE — Telephone Encounter (Signed)
Yes, for now.  Needs labs for HTN done and then OV a few days later to discuss his meds/labs.

## 2010-12-24 NOTE — Telephone Encounter (Signed)
Does the patient continue the Zegerid?

## 2010-12-28 NOTE — Telephone Encounter (Signed)
Left message for patient to return my call to schedule appt with labs prior.

## 2010-12-28 NOTE — Telephone Encounter (Signed)
Patient returned call, Appt scheduled with labs prior.
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
## 2011-01-06 ENCOUNTER — Other Ambulatory Visit (INDEPENDENT_AMBULATORY_CARE_PROVIDER_SITE_OTHER): Payer: 59 | Admitting: Family Medicine

## 2011-01-06 DIAGNOSIS — I10 Essential (primary) hypertension: Secondary | ICD-10-CM

## 2011-01-06 LAB — LIPID PANEL
LDL Cholesterol: 115 mg/dL — ABNORMAL HIGH (ref 0–99)
Triglycerides: 139 mg/dL (ref 0.0–149.0)
VLDL: 27.8 mg/dL (ref 0.0–40.0)

## 2011-01-06 LAB — COMPREHENSIVE METABOLIC PANEL
ALT: 10 U/L (ref 0–53)
AST: 16 U/L (ref 0–37)
Albumin: 3.7 g/dL (ref 3.5–5.2)
CO2: 29 mEq/L (ref 19–32)
Calcium: 9.4 mg/dL (ref 8.4–10.5)
Chloride: 103 mEq/L (ref 96–112)
Creatinine, Ser: 1.1 mg/dL (ref 0.4–1.5)
GFR: 75.48 mL/min (ref >60.00)
Potassium: 4.6 mEq/L (ref 3.5–5.1)

## 2011-01-12 ENCOUNTER — Encounter: Payer: Self-pay | Admitting: Family Medicine

## 2011-01-13 ENCOUNTER — Encounter: Payer: Self-pay | Admitting: Family Medicine

## 2011-01-13 ENCOUNTER — Ambulatory Visit (INDEPENDENT_AMBULATORY_CARE_PROVIDER_SITE_OTHER): Payer: 59 | Admitting: Family Medicine

## 2011-01-13 DIAGNOSIS — Z125 Encounter for screening for malignant neoplasm of prostate: Secondary | ICD-10-CM

## 2011-01-13 DIAGNOSIS — I10 Essential (primary) hypertension: Secondary | ICD-10-CM

## 2011-01-13 DIAGNOSIS — C61 Malignant neoplasm of prostate: Secondary | ICD-10-CM | POA: Insufficient documentation

## 2011-01-13 DIAGNOSIS — K120 Recurrent oral aphthae: Secondary | ICD-10-CM | POA: Insufficient documentation

## 2011-01-13 HISTORY — DX: Malignant neoplasm of prostate: C61

## 2011-01-13 NOTE — Progress Notes (Signed)
Hypertension:    Using medication without problems or lightheadedness: yes Chest pain with exertion:no Edema:no Short of breath:no Average home EXB:MWUXLKG to day Other issues: no, labs d/w pt.    Ear pain.  L sided.  No R sided pain.  Tongue is a little sore.  All started late Tuesday night.  No FCNAVD.  No cough.  Occ AM rhinorrhea, at baseline.   D/w pt about prostate cancer screening.   Meds, vitals, and allergies reviewed.   PMH and SH reviewed  ROS: See HPI.  Otherwise negative.    GEN: nad, alert and oriented HEENT: mucous membranes moist, TM wnl and normal movement on valsalva, tongue with aphthous ulcer on L side NECK: supple w/o LA CV: rrr. PULM: ctab, no inc wob ABD: soft, +bs EXT: no edema SKIN: no acute rash

## 2011-01-13 NOTE — Assessment & Plan Note (Signed)
Gargle with warm salt water and follow up as needed.  Should resolve.

## 2011-01-13 NOTE — Assessment & Plan Note (Addendum)
Controlled, no change in meds. Labs d/w pt. >25 min spent with face to face with patient, >50% counseling.

## 2011-01-13 NOTE — Patient Instructions (Signed)
Gargle with warm salt water for your tongue ulcer.  Keep exercising and limit salt intake otherwise.  Take care.  Let me know if you want prostate cancer screening.  I would get a flu shot each fall.  Glad to see you.  I would recheck you labs in 1 year.

## 2011-01-13 NOTE — Assessment & Plan Note (Signed)
D/w pt re: PSA.  He declined screening, including DRE.  He is aware of potential delay in dx and subsequent affect on treatment.  He'll let me know if/when he wants to be tested.

## 2011-01-25 ENCOUNTER — Other Ambulatory Visit: Payer: Self-pay | Admitting: Family Medicine

## 2011-01-26 ENCOUNTER — Other Ambulatory Visit: Payer: Self-pay | Admitting: Family Medicine

## 2011-02-14 ENCOUNTER — Telehealth: Payer: Self-pay | Admitting: *Deleted

## 2011-02-14 ENCOUNTER — Ambulatory Visit (INDEPENDENT_AMBULATORY_CARE_PROVIDER_SITE_OTHER): Payer: 59 | Admitting: Family Medicine

## 2011-02-14 ENCOUNTER — Encounter: Payer: Self-pay | Admitting: Family Medicine

## 2011-02-14 DIAGNOSIS — M549 Dorsalgia, unspecified: Secondary | ICD-10-CM

## 2011-02-14 MED ORDER — CYCLOBENZAPRINE HCL 10 MG PO TABS: 5.0000 mg | ORAL_TABLET | Freq: Three times a day (TID) | ORAL | Status: DC | PRN

## 2011-02-14 NOTE — Patient Instructions (Signed)
I would take tylenol as needed and use the flexeril as needed for spasm in your back. It can make you drowsy.  You can break it in half if needed.  Take care.  Glad to see you.

## 2011-02-14 NOTE — Progress Notes (Signed)
Was picking up some chicken wire 5 days ago.  Back was a little sore after that.  Had been stooping putting the wire out.  B lower back pain. Can't take ibuprofen.  Took some aspirin with some relief initially, less so now.  Tylenol didn't help.    Wife was sick and he had a cold last week.  He was getting better and now had a mild ST last night. No FCNAVD.  Occ cough, mild, ie throat clearing.  No rhinorrhea. No ear pain.    Meds, vitals, and allergies reviewed.   ROS: See HPI.  Otherwise, noncontributory.  nad ncat Tm wnl Nasal and OP wnl Neck supple rrr ctab Back with L > R mid lumbar paraspinal tenderness but no midline pain.  Distally w/o motor deficit.

## 2011-02-14 NOTE — Telephone Encounter (Signed)
Patient notified as instructed by telephone. Pt said he had called back and has appt to see Dr Para March later today. Pt advised to keep that appt.

## 2011-02-14 NOTE — Telephone Encounter (Signed)
Patient says that he was picking up something heavy over the weekend and threw his back out. He is asking what he could take to help with the pain. He says that he can only take certain things because of it messes up his stomach. Please advise.

## 2011-02-14 NOTE — Telephone Encounter (Signed)
Tylenol is his option - acetaminophen -- since the nsaids hurt his stomach Also gentle heat and stretching F/u if not imp

## 2011-02-15 DIAGNOSIS — M549 Dorsalgia, unspecified: Secondary | ICD-10-CM | POA: Insufficient documentation

## 2011-02-15 NOTE — Assessment & Plan Note (Signed)
Benign exam, use muscle relaxer and heat. Sedation caution given.  F/u prn.  I would follow the ST clinically- benign exam and he'll let us know if progressive.

## 2011-06-08 ENCOUNTER — Ambulatory Visit (INDEPENDENT_AMBULATORY_CARE_PROVIDER_SITE_OTHER): Payer: 59 | Admitting: Family Medicine

## 2011-06-08 ENCOUNTER — Encounter: Payer: Self-pay | Admitting: Family Medicine

## 2011-06-08 VITALS — BP 128/76 | HR 67 | Temp 98.6°F | Wt 200.1 lb

## 2011-06-08 DIAGNOSIS — Z23 Encounter for immunization: Secondary | ICD-10-CM

## 2011-06-08 DIAGNOSIS — Z125 Encounter for screening for malignant neoplasm of prostate: Secondary | ICD-10-CM

## 2011-06-08 DIAGNOSIS — I10 Essential (primary) hypertension: Secondary | ICD-10-CM

## 2011-06-08 NOTE — Patient Instructions (Signed)
You can get your results through our phone system.  Follow the instructions on the blue card. Take care.  Let me know if you have concerns.

## 2011-06-08 NOTE — Assessment & Plan Note (Signed)
Exam unremarkable, check PSA and notify pt.

## 2011-06-08 NOTE — Progress Notes (Signed)
Had flu shot today.    Prostate cancer screening.  No dysuria, no nocturia.  No complaints.  He returned for screening today.   Hypertension:    Using medication without problems or lightheadedness: yes Chest pain with exertion:no Edema:no Short of breath:no Labs prev okay in 12/2010  Meds, vitals, and allergies reviewed.   PMH and SH reviewed  ROS: See HPI.  Otherwise negative.    GEN: nad, alert and oriented NECK: supple w/o LA CV: rrr. PULM: ctab, no inc wob EXT: no edema Prostate gland firm and smooth, no enlargement, nodularity, tenderness, mass, asymmetry or induration.

## 2011-06-08 NOTE — Assessment & Plan Note (Signed)
Controlled , no change in meds 

## 2011-08-05 ENCOUNTER — Other Ambulatory Visit: Payer: Self-pay | Admitting: Family Medicine

## 2011-08-10 NOTE — Telephone Encounter (Signed)
Ok to refill 

## 2011-12-14 ENCOUNTER — Ambulatory Visit (INDEPENDENT_AMBULATORY_CARE_PROVIDER_SITE_OTHER): Payer: 59 | Admitting: Family Medicine

## 2011-12-14 ENCOUNTER — Encounter: Payer: Self-pay | Admitting: Family Medicine

## 2011-12-14 VITALS — BP 114/78 | HR 84 | Temp 98.7°F | Wt 204.0 lb

## 2011-12-14 DIAGNOSIS — M542 Cervicalgia: Secondary | ICD-10-CM

## 2011-12-14 MED ORDER — CYCLOBENZAPRINE HCL 10 MG PO TABS: 5.0000 mg | ORAL_TABLET | Freq: Three times a day (TID) | ORAL | Status: AC | PRN

## 2011-12-14 NOTE — Progress Notes (Signed)
Neck pain. Woke up with pain on the L side.  Worst last night.  Local heat didn't help much.  No trigger known.  "I though I slept wrong."  No new pillows. Old pillow is in good repair.  No falls, trauma, wrecks.  No activity out of the normal.  Arms are okay, no arm pain or weakness.  L ear was hurting yesterday.  R side of neck is okay.  Pain with head turning, pain radiates up the back of the scalp on L side. No FCNAVD.  NSAID intolerant.   Meds, vitals, and allergies reviewed.   ROS: See HPI.  Otherwise, noncontributory.  nad ncat Mmm Tm wnl  Nasal and OP exam wnl Skin wnl, no rash Neck w/o midline pain. L trap and paraspinal muscles tight and this limits ROM but no meningismus.

## 2011-12-14 NOTE — Patient Instructions (Signed)
Take tylenol and use the heating pad for pain.  Take the muscle relaxer (flexeril) as needed; it can make you drowsy.

## 2011-12-15 DIAGNOSIS — M542 Cervicalgia: Secondary | ICD-10-CM | POA: Insufficient documentation

## 2011-12-15 HISTORY — DX: Cervicalgia: M54.2

## 2011-12-15 NOTE — Assessment & Plan Note (Signed)
Likely muscle spasm with subsequent irritation of occipital nerve, based on history.  No need to image.  Use local heat, tylenol and flexeril.  F/u prn.  Anatomy d/w pt.  Strength and sensation wnl x4

## 2012-02-17 ENCOUNTER — Other Ambulatory Visit: Payer: Self-pay | Admitting: Family Medicine

## 2012-10-15 ENCOUNTER — Other Ambulatory Visit: Payer: Self-pay | Admitting: Family Medicine

## 2012-10-16 ENCOUNTER — Other Ambulatory Visit: Payer: Self-pay | Admitting: *Deleted

## 2012-10-16 NOTE — Telephone Encounter (Signed)
Faxed refill request. Please advise.  

## 2012-10-16 NOTE — Telephone Encounter (Signed)
Electronic refill request.  Please advise. 

## 2012-10-17 ENCOUNTER — Other Ambulatory Visit: Payer: Self-pay | Admitting: Family Medicine

## 2012-10-17 MED ORDER — TADALAFIL 20 MG PO TABS: 20.0000 mg | ORAL_TABLET | Freq: Every day | ORAL | Status: DC | PRN

## 2012-10-17 NOTE — Telephone Encounter (Signed)
Sent.  Schedule CPE later in 2014.

## 2012-10-17 NOTE — Telephone Encounter (Signed)
[facility] request refill Cialis.Please advise.

## 2012-10-17 NOTE — Telephone Encounter (Signed)
Left vm requesting call back. 

## 2012-10-17 NOTE — Telephone Encounter (Signed)
Sent.  Needs CPE scheduled.   

## 2012-10-18 NOTE — Telephone Encounter (Signed)
Patient advised.  Scheduled CPE.

## 2012-12-09 ENCOUNTER — Other Ambulatory Visit: Payer: Self-pay | Admitting: Family Medicine

## 2012-12-09 DIAGNOSIS — Z125 Encounter for screening for malignant neoplasm of prostate: Secondary | ICD-10-CM

## 2012-12-09 DIAGNOSIS — I10 Essential (primary) hypertension: Secondary | ICD-10-CM

## 2012-12-13 ENCOUNTER — Other Ambulatory Visit (INDEPENDENT_AMBULATORY_CARE_PROVIDER_SITE_OTHER): Payer: 59

## 2012-12-13 DIAGNOSIS — Z125 Encounter for screening for malignant neoplasm of prostate: Secondary | ICD-10-CM

## 2012-12-13 DIAGNOSIS — I10 Essential (primary) hypertension: Secondary | ICD-10-CM

## 2012-12-13 LAB — LIPID PANEL
Cholesterol: 224 mg/dL — ABNORMAL HIGH (ref 0–200)
Total CHOL/HDL Ratio: 5
Triglycerides: 110 mg/dL (ref 0.0–149.0)

## 2012-12-13 LAB — COMPREHENSIVE METABOLIC PANEL
ALT: 8 U/L (ref 0–53)
BUN: 17 mg/dL (ref 6–23)
CO2: 28 mEq/L (ref 19–32)
Calcium: 9.9 mg/dL (ref 8.4–10.5)
Chloride: 102 mEq/L (ref 96–112)
Creatinine, Ser: 1 mg/dL (ref 0.4–1.5)
GFR: 83.09 mL/min (ref >60.00)
Glucose, Bld: 96 mg/dL (ref 70–99)
Total Bilirubin: 0.8 mg/dL (ref 0.3–1.2)

## 2012-12-20 ENCOUNTER — Ambulatory Visit (INDEPENDENT_AMBULATORY_CARE_PROVIDER_SITE_OTHER): Payer: 59 | Admitting: Family Medicine

## 2012-12-20 ENCOUNTER — Encounter: Payer: Self-pay | Admitting: Family Medicine

## 2012-12-20 VITALS — BP 104/62 | HR 61 | Temp 98.4°F | Ht 69.0 in | Wt 200.0 lb

## 2012-12-20 DIAGNOSIS — I10 Essential (primary) hypertension: Secondary | ICD-10-CM

## 2012-12-20 DIAGNOSIS — Z125 Encounter for screening for malignant neoplasm of prostate: Secondary | ICD-10-CM

## 2012-12-20 DIAGNOSIS — R972 Elevated prostate specific antigen [PSA]: Secondary | ICD-10-CM

## 2012-12-20 DIAGNOSIS — N529 Male erectile dysfunction, unspecified: Secondary | ICD-10-CM

## 2012-12-20 DIAGNOSIS — Z0000 Encounter for general adult medical examination without abnormal findings: Secondary | ICD-10-CM | POA: Insufficient documentation

## 2012-12-20 MED ORDER — CANDESARTAN CILEXETIL 16 MG PO TABS: 8.0000 mg | ORAL_TABLET | Freq: Every day | ORAL | Status: DC

## 2012-12-20 MED ORDER — TADALAFIL 20 MG PO TABS: 20.0000 mg | ORAL_TABLET | Freq: Every day | ORAL | Status: DC | PRN

## 2012-12-20 NOTE — Progress Notes (Signed)
CPE- See plan.  Routine anticipatory guidance given to patient.  See health maintenance. Tetanus 2009 Flu shot yearly Shingles shot 2011 Colonoscopy in 2012 PSA elevated, d/w pt.  Diet and exercise d/w pt.  Encouraged to work on both; no regular scheduled exercise yet.   Advance directive.  Would have wife designated if incapacitated.   Hypertension:    Using medication without problems or lightheadedness: yes Chest pain with exertion:see below.  Edema:no Short of breath:no  He has some occ, random L sided chest pain about 1.5 months ago.  It was mild and could happen at rest or with exertion. Wasn't worse with exertion. The episodes were mild, brief and apparently resolved.  He attributed it to stress with family- his daughter had split up with her husband.  She's living with patient now.    PSA elevation.  D/w pt about options.  He has no FH or LUTS.   PMH and SH reviewed  Meds, vitals, and allergies reviewed.   ROS: See HPI.  Otherwise negative.    GEN: nad, alert and oriented HEENT: mucous membranes moist NECK: supple w/o LA CV: rrr. PULM: ctab, no inc wob ABD: soft, +bs EXT: no edema SKIN: no acute rash Prostate gland firm and smooth, no enlargement, nodularity, tenderness, mass, asymmetry or induration.

## 2012-12-20 NOTE — Assessment & Plan Note (Signed)
Controlled with current meds. Continue as is. 

## 2012-12-20 NOTE — Assessment & Plan Note (Signed)
Routine anticipatory guidance given to patient.  See health maintenance. Tetanus 2009 Flu shot yearly Shingles shot 2011 Colonoscopy in 2012 PSA elevated, d/w pt.  Diet and exercise d/w pt.  Encouraged to work on both; no regular scheduled exercise yet.   Advance directive.  Would have wife designated if incapacitated.

## 2012-12-20 NOTE — Assessment & Plan Note (Signed)
Recheck in near future, if continues to be elevated then refer.  He agrees.  DRE wnl.

## 2012-12-20 NOTE — Patient Instructions (Addendum)
Come back for repeat labs in 1 month.  You don't need to fast.  Work on your diet and cut out ribs to help your cholesterol.  Take care.  Glad to see you.

## 2012-12-20 NOTE — Assessment & Plan Note (Signed)
Controlled, continue current meds.  Labs discussed. His exam is unremarkable and had sig social cause of the prev atypical chest discomfort that is now resolved.  He'll notify if sx return.  He agrees.

## 2013-01-18 ENCOUNTER — Encounter: Payer: Self-pay | Admitting: Family Medicine

## 2013-01-18 ENCOUNTER — Ambulatory Visit (INDEPENDENT_AMBULATORY_CARE_PROVIDER_SITE_OTHER): Payer: 59 | Admitting: Family Medicine

## 2013-01-18 VITALS — BP 100/70 | HR 58 | Temp 98.0°F | Wt 198.0 lb

## 2013-01-18 DIAGNOSIS — H6990 Unspecified Eustachian tube disorder, unspecified ear: Secondary | ICD-10-CM | POA: Insufficient documentation

## 2013-01-18 DIAGNOSIS — H6982 Other specified disorders of Eustachian tube, left ear: Secondary | ICD-10-CM

## 2013-01-18 DIAGNOSIS — H6980 Other specified disorders of Eustachian tube, unspecified ear: Secondary | ICD-10-CM

## 2013-01-18 DIAGNOSIS — Z125 Encounter for screening for malignant neoplasm of prostate: Secondary | ICD-10-CM

## 2013-01-18 DIAGNOSIS — R972 Elevated prostate specific antigen [PSA]: Secondary | ICD-10-CM

## 2013-01-18 LAB — PSA: PSA: 5.13 ng/mL — ABNORMAL HIGH (ref 0.10–4.00)

## 2013-01-18 NOTE — Assessment & Plan Note (Signed)
Gently pop the ear, can take claritin prn.  Fu prn.  Anatomy d/w pt.

## 2013-01-18 NOTE — Addendum Note (Signed)
Addended by: Alvina Chou on: 01/18/2013 03:00 PM   Modules accepted: Orders

## 2013-01-18 NOTE — Patient Instructions (Addendum)
Go to the lab on the way out.  We'll contact you with your lab report. Gently try to pop your ears and take 10mg  of claritin a day if needed.  This should gradually get better.

## 2013-01-18 NOTE — Assessment & Plan Note (Signed)
Recheck PSA today

## 2013-01-18 NOTE — Progress Notes (Signed)
Due for repeat PSA today. Discussed.    Several days of L ear pain and pain under the L side of the tongue.  No R sided sx.  Taking ASA for mild HA.  Nose isn't stuffy.   Mildly ST.  No fevers, minimal cough.    Meds, vitals, and allergies reviewed.   ROS: See HPI.  Otherwise, noncontributory.  GEN: nad, alert and oriented HEENT: mucous membranes moist, tm w/o erythema, nasal exam w/o erythema, clear discharge noted,  OP with cobblestoning, no masses or ulceration in the OP, sluggish TM movement in L ear NECK: supple w/o LA CV: rrr.   PULM: ctab, no inc wob

## 2013-01-20 ENCOUNTER — Other Ambulatory Visit: Payer: Self-pay | Admitting: Family Medicine

## 2013-01-20 DIAGNOSIS — R972 Elevated prostate specific antigen [PSA]: Secondary | ICD-10-CM

## 2013-01-24 ENCOUNTER — Other Ambulatory Visit: Payer: 59

## 2013-04-08 ENCOUNTER — Other Ambulatory Visit: Payer: Self-pay | Admitting: Urology

## 2013-04-16 ENCOUNTER — Encounter: Payer: Self-pay | Admitting: Family Medicine

## 2013-05-09 ENCOUNTER — Encounter (HOSPITAL_COMMUNITY): Payer: Self-pay | Admitting: Pharmacy Technician

## 2013-05-09 ENCOUNTER — Other Ambulatory Visit (HOSPITAL_COMMUNITY): Payer: Self-pay | Admitting: Urology

## 2013-05-09 NOTE — Patient Instructions (Addendum)
20 Beni Turrell  05/09/2013   Your procedure is scheduled on: 05-27-2013  Report to [facility] at 830  AM.  Call this number if you have problems the morning of surgery [phone]   Remember: FOLLOW INSTRUCTIONS FOR DIET FROM DR.BORDEN'S OFFICE !   Do not eat food or drink liquids :After Midnight.     Take these medicines the morning of surgery with A SIP OF WATER: Zergrid                                SEE [facility] PREPARING FOR SURGERY SHEET             You may not have any metal on your body including hair pins and piercings  Do not wear jewelry, make-up.  Do not wear lotions, powders, or perfumes. You may wear deodorant.   Men may shave face and neck.  Do not bring valuables to the hospital. [facility] IS NOT RESPONSIBLE FOR VALUEABLES.  Contacts, dentures or bridgework may not be worn into surgery.  Leave suitcase in the car. After surgery it may be brought to your room.  For patients admitted to the hospital, checkout time is 11:00 AM the day of discharge.   Patients discharged the day of surgery will not be allowed to drive home.  Name and phone number of your driver:  Special Instructions: N/A   Please read over the following fact sheets that you were given: blood fact sheet, incentive spirometer sheet  Call Merleen Nicely RN pre op nurse if needed [phone]    FAILURE TO FOLLOW THESE INSTRUCTIONS MAY RESULT IN THE CANCELLATION OF YOUR SURGERY.  PATIENT SIGNATURE___________________________________________  NURSE SIGNATURE_____________________________________________

## 2013-05-10 ENCOUNTER — Encounter (HOSPITAL_COMMUNITY): Payer: Self-pay

## 2013-05-10 ENCOUNTER — Ambulatory Visit (HOSPITAL_COMMUNITY)
Admission: RE | Admit: 2013-05-10 | Discharge: 2013-05-10 | Disposition: A | Discharge status: Home / Self Care | Payer: 59 | Source: Ambulatory Visit | Attending: Urology | Admitting: Urology

## 2013-05-10 ENCOUNTER — Encounter (HOSPITAL_COMMUNITY)
Admission: RE | Admit: 2013-05-10 | Discharge: 2013-05-10 | Disposition: A | Discharge status: Home / Self Care | Payer: 59 | Source: Ambulatory Visit | Attending: Urology | Admitting: Urology

## 2013-05-10 DIAGNOSIS — Z01818 Encounter for other preprocedural examination: Secondary | ICD-10-CM | POA: Insufficient documentation

## 2013-05-10 DIAGNOSIS — I498 Other specified cardiac arrhythmias: Secondary | ICD-10-CM | POA: Insufficient documentation

## 2013-05-10 DIAGNOSIS — Z01812 Encounter for preprocedural laboratory examination: Secondary | ICD-10-CM | POA: Insufficient documentation

## 2013-05-10 DIAGNOSIS — Z01810 Encounter for preprocedural cardiovascular examination: Secondary | ICD-10-CM | POA: Insufficient documentation

## 2013-05-10 DIAGNOSIS — I10 Essential (primary) hypertension: Secondary | ICD-10-CM | POA: Insufficient documentation

## 2013-05-10 HISTORY — DX: Gastro-esophageal reflux disease without esophagitis: K21.9

## 2013-05-10 LAB — BASIC METABOLIC PANEL
BUN: 13 mg/dL (ref 6–23)
CO2: 28 mEq/L (ref 19–32)
Calcium: 10.3 mg/dL (ref 8.4–10.5)
Chloride: 103 mEq/L (ref 96–112)
GFR calc Af Amer: 80 mL/min — ABNORMAL LOW (ref >90)
GFR calc non Af Amer: 69 mL/min — ABNORMAL LOW (ref >90)
Glucose, Bld: 99 mg/dL (ref 70–99)
Potassium: 5.1 mEq/L (ref 3.5–5.1)
Sodium: 140 mEq/L (ref 135–145)

## 2013-05-10 LAB — CBC
Hemoglobin: 15.1 g/dL (ref 13.0–17.0)
MCH: 29.4 pg (ref 26.0–34.0)
MCHC: 33.9 g/dL (ref 30.0–36.0)
MCV: 86.9 fL (ref 78.0–100.0)
Platelets: 304 10*3/uL (ref 150–400)
RBC: 5.13 MIL/uL (ref 4.22–5.81)

## 2013-05-10 IMAGING — CR DG CHEST 2V
2 series · 2 of 2 positions shown · non-contrast
Comparison: [DATE].

CLINICAL DATA: Preop radical prostatectomy, hypertension.

EXAM:
CHEST  2 VIEW

[w chest pa]
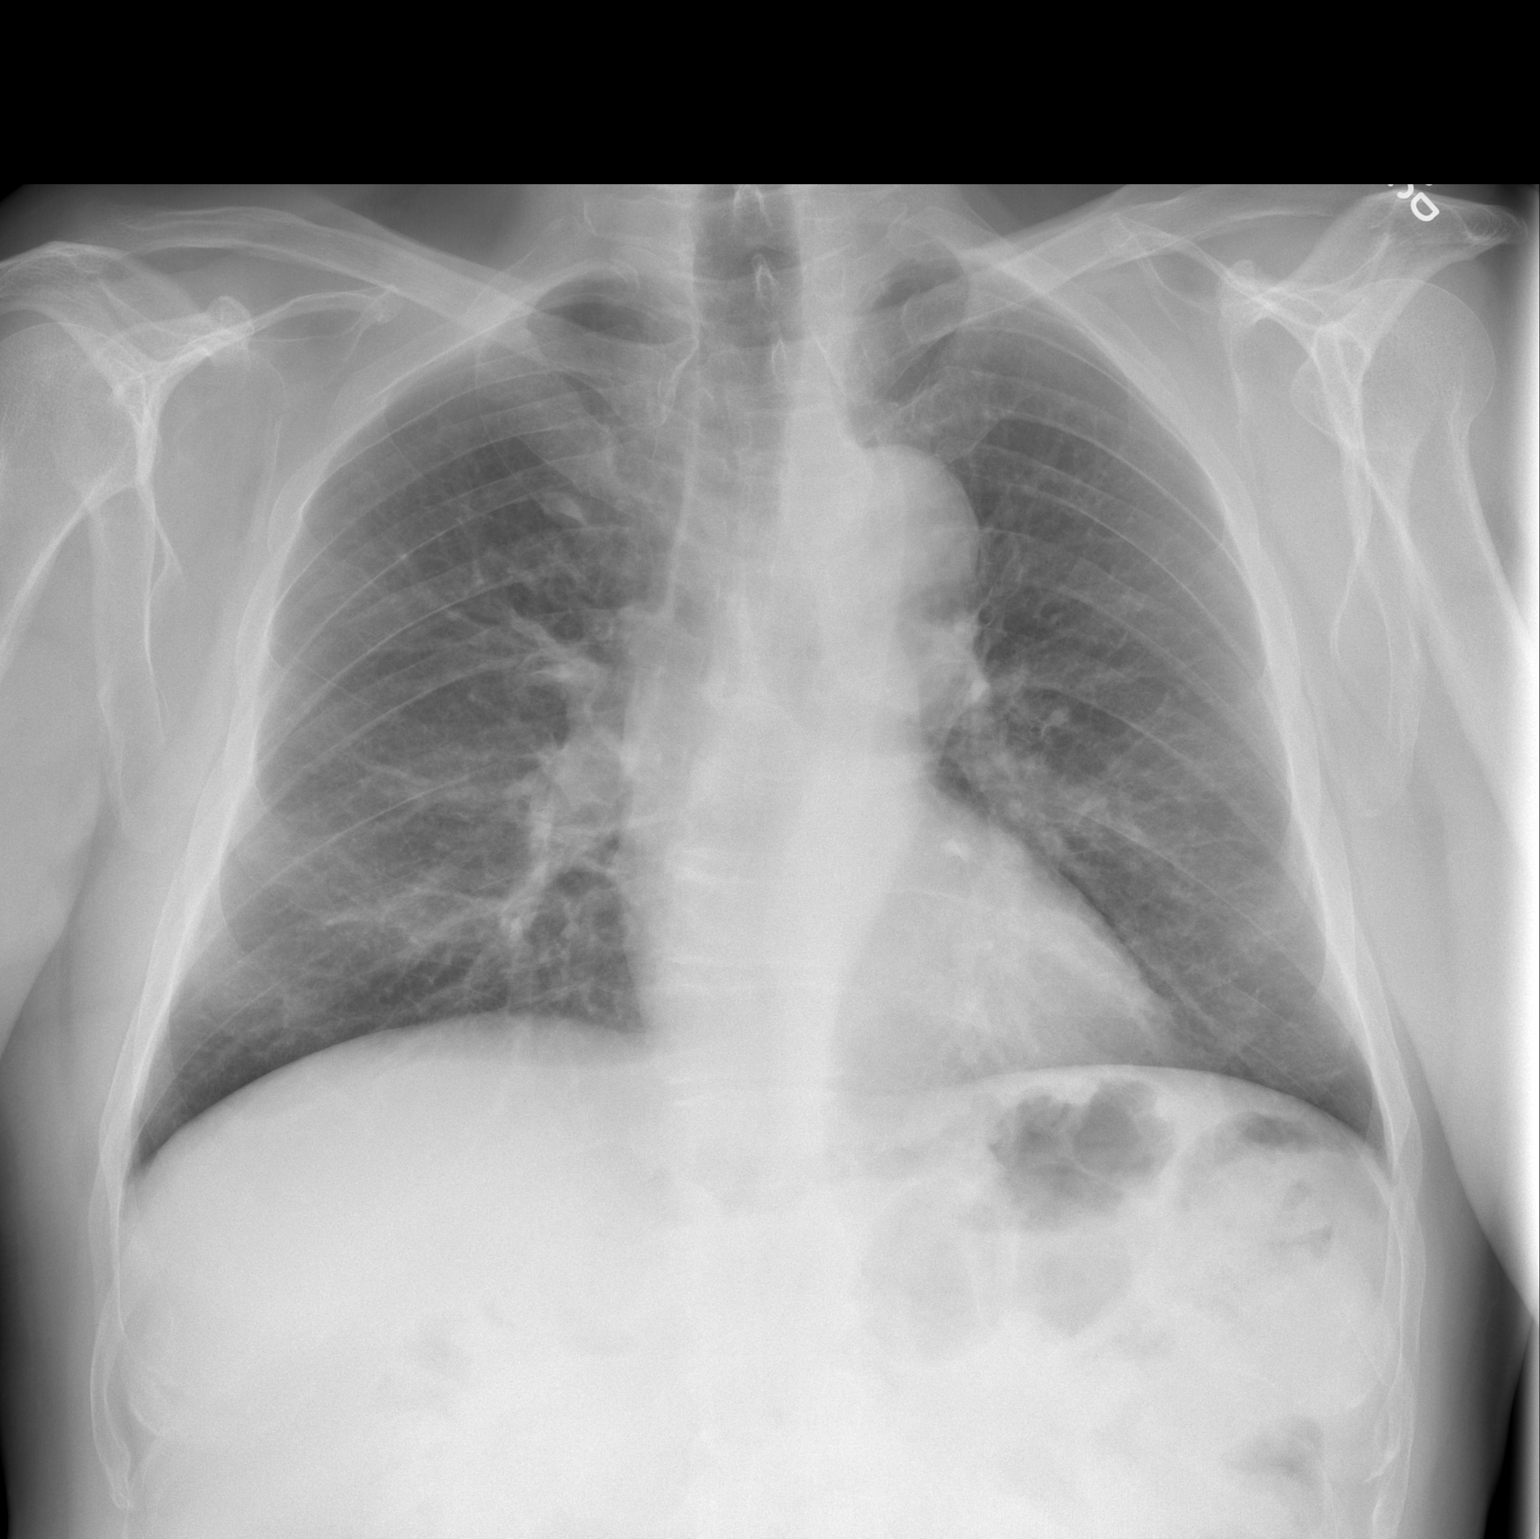

[w chest lat]
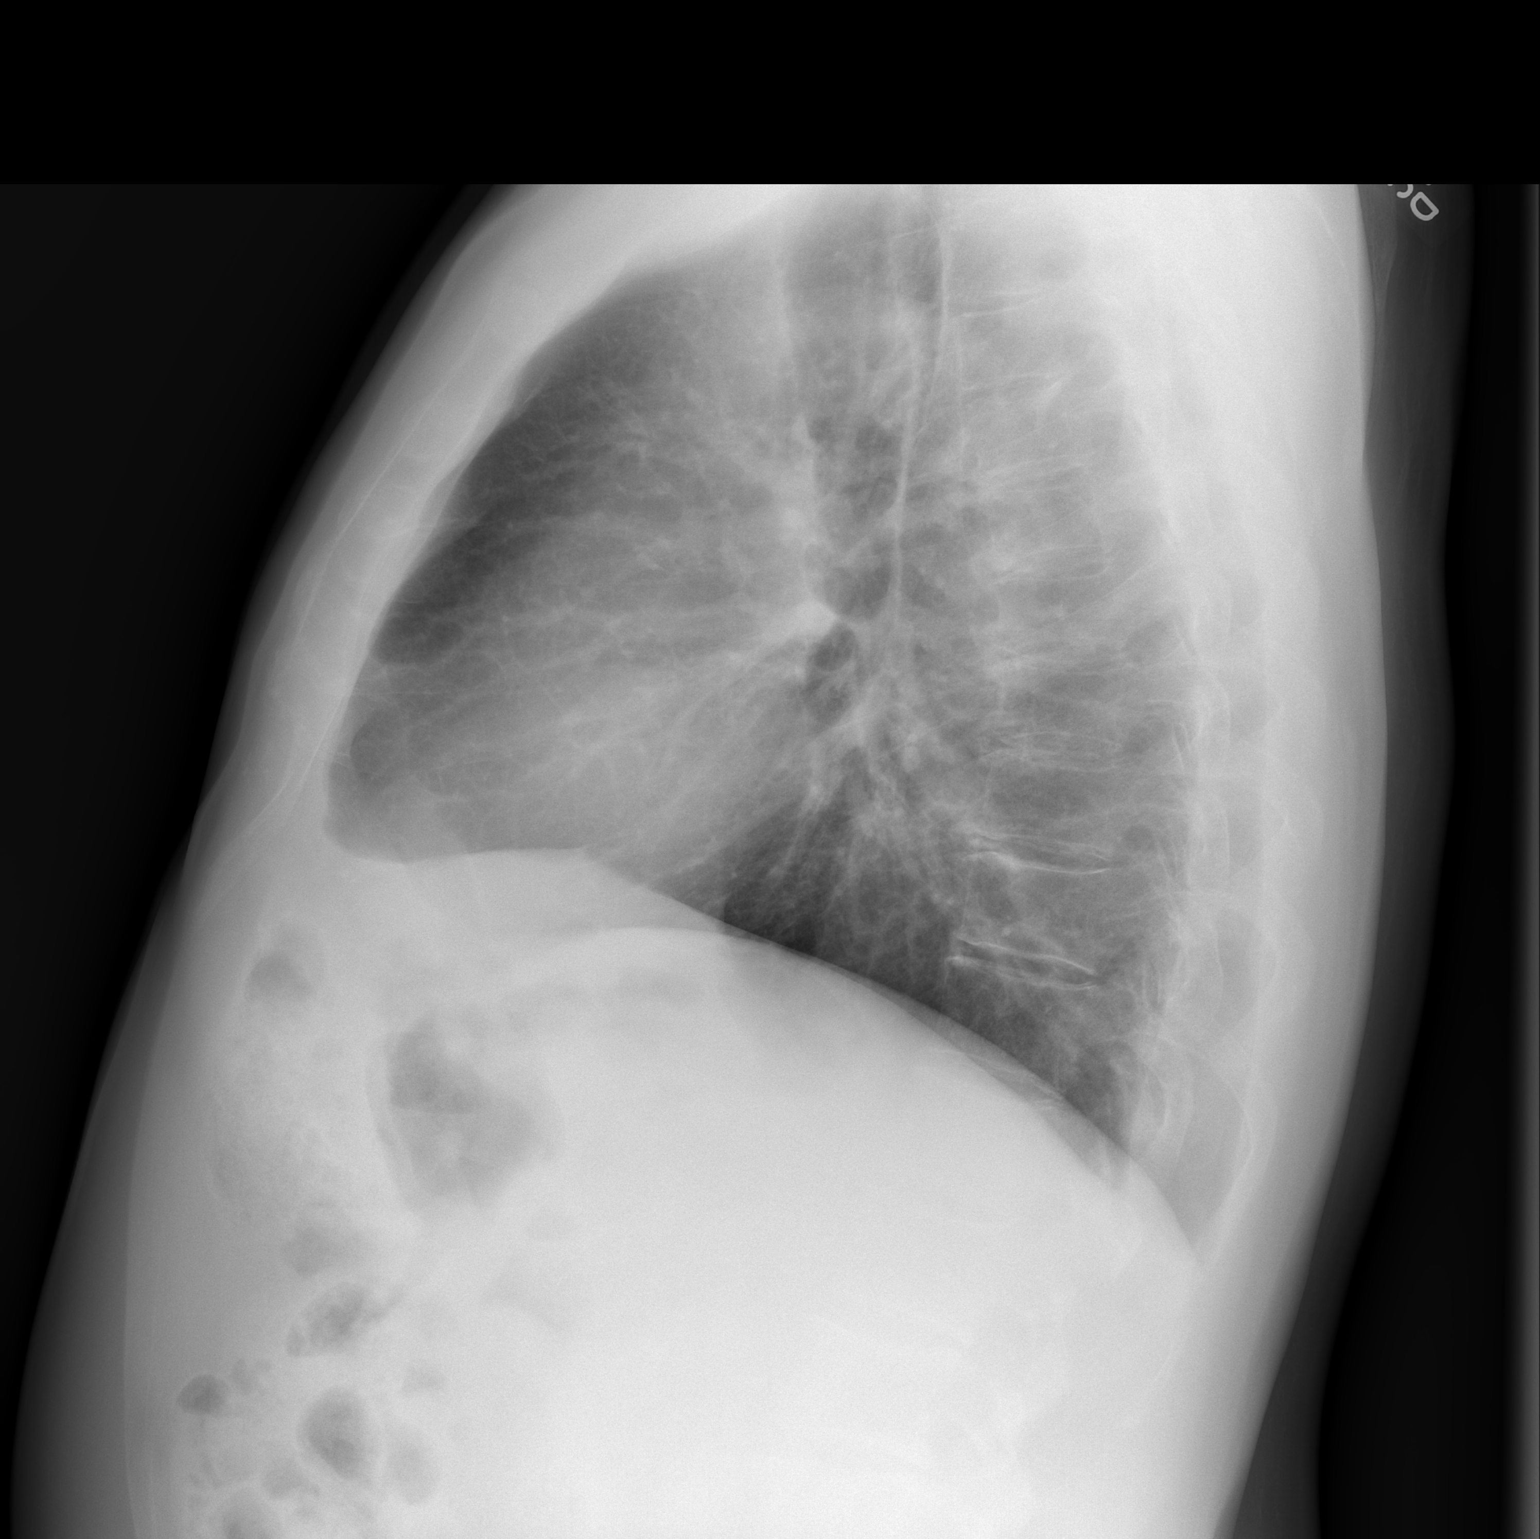

[2 of 2 positions shown; findings below may reference images not displayed]

FINDINGS: The heart size and mediastinal contours are within normal limits.
Stable interstitial densities are noted throughout both lungs. No
pleural effusion or pneumothorax is noted. No acute cardiopulmonary
abnormality seen. The visualized skeletal structures are
unremarkable.
IMPRESSION: No active cardiopulmonary disease.

## 2013-05-10 NOTE — Progress Notes (Signed)
05/10/13 1051  OBSTRUCTIVE SLEEP APNEA  Have you ever been diagnosed with sleep apnea through a sleep study? No  Do you snore loudly (loud enough to be heard through closed doors)?  1  Do you often feel tired, fatigued, or sleepy during the daytime? 0  Has anyone observed you stop breathing during your sleep? 0  Do you have, or are you being treated for high blood pressure? 1  BMI more than 35 kg/m2? 0  Age over 63 years old? 1  Neck circumference greater than 40 cm/18 inches? 0  Gender: 1  Obstructive Sleep Apnea Score 4  Score 4 or greater  Results sent to PCP

## 2013-05-26 NOTE — H&P (Signed)
  History of Present Illness  Howard [name] is a 62 year old with the following urologic history:  1) Prostate cancer: He presented initially in July 2014 with and elevated PSA of 5.13 and underwent a prostate biopsy on 03/15/13 which demonstrated Gleason 3+3=6 adenocarcinoma of the prostate in 8 out of 12 biopsy cores. He has no family history of prostate cancer.  TNM stage: cT1c Nx Mx PSA: 5.13 Gleason score: 3+3=6 Biopsy (03/15/13): 8/12 cores positive    Left: L lateral apex (10%, PNI), L apex (20%), L lateral mid (20%), L mid (50%, 3+3=6)    Right: R mid (< 5%), R lateral mid (20%), R base (60%< PNI), R lateral base (40%) Prostate volume: 22.3 cc  Urinary function: IPSS is 0. Erectile function: SHIM score is 11. He has had only a modest response to PDE-5 inhibitors (Viagra has caused significant headache)    Past Medical History Problems  1. History of  Esophageal Reflux 530.81 2. History of  Hypercholesterolemia 272.0 3. History of  Hypertension 401.9  Surgical History Problems  1. History of  Foot Surgery Bilateral  Current Meds 1. Atacand 8 MG Oral Tablet; Therapy: (Recorded:09Jul2014) to 2. Cialis 20 MG Oral Tablet; Therapy: 05Mar2014 to 3. Zegerid CAPS; Therapy: (Recorded:09Jul2014) to  Allergies Medication  1. Ibuprofen CAPS  Family History Problems  1. Maternal history of  Stroke Syndrome V17.1 Denied  2. Family history of  Prostate Cancer  Social History Problems  1. Marital History - Currently Married 2. Never A Smoker 3. Occupation: Retired Tour manager Denied  4. History of  Alcohol Use  Vitals  BMI Calculated: 29.62 BSA Calculated: 2.06 Height: 5 ft 9 in Weight: 200 lb    Physical Exam Constitutional: Well nourished and well developed . No acute distress.  ENT:. The ears and nose are normal in appearance.  Neck: The appearance of the neck is normal and no neck mass is present.  Pulmonary: No respiratory distress and normal respiratory rhythm  and effort.  Cardiovascular: Heart rate and rhythm are normal . No peripheral edema.  Abdomen: The abdomen is soft and nontender. No masses are palpated. No CVA tenderness. No hernias are palpable. No hepatosplenomegaly noted.  Neuro/Psych:. Mood and affect are appropriate.   Assessment Assessed  1. Prostate Cancer 185    Discussion/Summary  1.  Prostate cancer: He has elected to undergo surgical therapy and will undergo a robotic assisted laparoscopic radical prostatectomy. I discussed the potential benefits and risks of the procedure, side effects of the proposed treatment, the likelihood of the patient achieving the goals of the procedure, and any potential problems that might occur during the procedure or recuperation.

## 2013-05-27 ENCOUNTER — Encounter (HOSPITAL_COMMUNITY): Payer: 59 | Admitting: Anesthesiology

## 2013-05-27 ENCOUNTER — Observation Stay (HOSPITAL_COMMUNITY)
Admission: RE | Admit: 2013-05-27 | Discharge: 2013-05-28 | Disposition: A | Discharge status: Home / Self Care | Payer: 59 | Source: Ambulatory Visit | Attending: Urology | Admitting: Urology

## 2013-05-27 ENCOUNTER — Ambulatory Visit (HOSPITAL_COMMUNITY): Payer: 59 | Admitting: Anesthesiology

## 2013-05-27 ENCOUNTER — Encounter (HOSPITAL_COMMUNITY): Payer: Self-pay | Admitting: *Deleted

## 2013-05-27 ENCOUNTER — Encounter (HOSPITAL_COMMUNITY)
Admission: RE | Disposition: A | Discharge status: Home / Self Care | Payer: Self-pay | Source: Ambulatory Visit | Attending: Urology

## 2013-05-27 DIAGNOSIS — C61 Malignant neoplasm of prostate: Principal | ICD-10-CM | POA: Insufficient documentation

## 2013-05-27 DIAGNOSIS — I10 Essential (primary) hypertension: Secondary | ICD-10-CM | POA: Insufficient documentation

## 2013-05-27 DIAGNOSIS — E7800 Pure hypercholesterolemia, unspecified: Secondary | ICD-10-CM | POA: Insufficient documentation

## 2013-05-27 DIAGNOSIS — K219 Gastro-esophageal reflux disease without esophagitis: Secondary | ICD-10-CM | POA: Insufficient documentation

## 2013-05-27 HISTORY — PX: ROBOT ASSISTED LAPAROSCOPIC RADICAL PROSTATECTOMY: SHX5141

## 2013-05-27 LAB — TYPE AND SCREEN
ABO/RH(D): A POS
Antibody Screen: NEGATIVE

## 2013-05-27 LAB — HEMOGLOBIN AND HEMATOCRIT, BLOOD: HCT: 37.7 % — ABNORMAL LOW (ref 39.0–52.0)

## 2013-05-27 SURGERY — ROBOTIC ASSISTED LAPAROSCOPIC RADICAL PROSTATECTOMY LEVEL 1
Anesthesia: General | Wound class: Clean Contaminated

## 2013-05-27 MED ORDER — HEPARIN SODIUM (PORCINE) 1000 UNIT/ML IJ SOLN
INTRAMUSCULAR | Status: AC
  Filled 2013-05-27: qty 1

## 2013-05-27 MED ORDER — CEFAZOLIN SODIUM-DEXTROSE 2-3 GM-% IV SOLR
2.0000 g | INTRAVENOUS | Status: AC
  Administered 2013-05-27: 2 g via INTRAVENOUS

## 2013-05-27 MED ORDER — STERILE WATER FOR IRRIGATION IR SOLN
Status: DC | PRN
  Administered 2013-05-27: 3000 mL

## 2013-05-27 MED ORDER — LIDOCAINE HCL (PF) 2 % IJ SOLN
INTRAMUSCULAR | Status: DC | PRN
  Administered 2013-05-27: 75 mg

## 2013-05-27 MED ORDER — DIPHENHYDRAMINE HCL 50 MG/ML IJ SOLN: 12.5000 mg | Freq: Four times a day (QID) | INTRAMUSCULAR | Status: DC | PRN

## 2013-05-27 MED ORDER — HYDROMORPHONE HCL PF 1 MG/ML IJ SOLN
0.2500 mg | INTRAMUSCULAR | Status: DC | PRN
  Administered 2013-05-27 (×4): 0.5 mg via INTRAVENOUS

## 2013-05-27 MED ORDER — BUPIVACAINE-EPINEPHRINE 0.25% -1:200000 IJ SOLN
INTRAMUSCULAR | Status: DC | PRN
  Administered 2013-05-27: 28 mL

## 2013-05-27 MED ORDER — MORPHINE SULFATE 2 MG/ML IJ SOLN
2.0000 mg | INTRAMUSCULAR | Status: DC | PRN
  Administered 2013-05-27 (×2): 4 mg via INTRAVENOUS
  Administered 2013-05-27: 2 mg via INTRAVENOUS
  Administered 2013-05-27: 4 mg via INTRAVENOUS
  Administered 2013-05-28: 2 mg via INTRAVENOUS
  Filled 2013-05-27: qty 1
  Filled 2013-05-27: qty 2
  Filled 2013-05-27: qty 1
  Filled 2013-05-27 (×2): qty 2

## 2013-05-27 MED ORDER — ACETAMINOPHEN 325 MG PO TABS
650.0000 mg | ORAL_TABLET | ORAL | Status: DC | PRN
  Administered 2013-05-28: 650 mg via ORAL
  Filled 2013-05-27: qty 2

## 2013-05-27 MED ORDER — LACTATED RINGERS IV SOLN
INTRAVENOUS | Status: DC | PRN
  Administered 2013-05-27: 11:00:00

## 2013-05-27 MED ORDER — KCL IN DEXTROSE-NACL 20-5-0.45 MEQ/L-%-% IV SOLN
INTRAVENOUS | Status: DC
  Administered 2013-05-27 – 2013-05-28 (×5): via INTRAVENOUS
  Filled 2013-05-27 (×4): qty 1000

## 2013-05-27 MED ORDER — CISATRACURIUM BESYLATE (PF) 10 MG/5ML IV SOLN
INTRAVENOUS | Status: DC | PRN
  Administered 2013-05-27: 4 mg via INTRAVENOUS
  Administered 2013-05-27: 2 mg via INTRAVENOUS
  Administered 2013-05-27: 4 mg via INTRAVENOUS
  Administered 2013-05-27: 10 mg via INTRAVENOUS

## 2013-05-27 MED ORDER — CEFAZOLIN SODIUM-DEXTROSE 2-3 GM-% IV SOLR
INTRAVENOUS | Status: AC
  Filled 2013-05-27: qty 50

## 2013-05-27 MED ORDER — ONDANSETRON HCL 4 MG/2ML IJ SOLN
INTRAMUSCULAR | Status: DC | PRN
  Administered 2013-05-27: 4 mg via INTRAMUSCULAR

## 2013-05-27 MED ORDER — DIPHENHYDRAMINE HCL 12.5 MG/5ML PO ELIX: 12.5000 mg | ORAL_SOLUTION | Freq: Four times a day (QID) | ORAL | Status: DC | PRN

## 2013-05-27 MED ORDER — PROMETHAZINE HCL 25 MG/ML IJ SOLN: 6.2500 mg | INTRAMUSCULAR | Status: DC | PRN

## 2013-05-27 MED ORDER — HYDROMORPHONE HCL PF 1 MG/ML IJ SOLN
INTRAMUSCULAR | Status: AC
  Filled 2013-05-27: qty 1

## 2013-05-27 MED ORDER — SUCCINYLCHOLINE CHLORIDE 20 MG/ML IJ SOLN
INTRAMUSCULAR | Status: DC | PRN
  Administered 2013-05-27: 100 mg via INTRAVENOUS

## 2013-05-27 MED ORDER — CIPROFLOXACIN HCL 500 MG PO TABS: 500.0000 mg | ORAL_TABLET | Freq: Two times a day (BID) | ORAL | Status: DC

## 2013-05-27 MED ORDER — FENTANYL CITRATE 0.05 MG/ML IJ SOLN
INTRAMUSCULAR | Status: DC | PRN
  Administered 2013-05-27: 50 ug via INTRAVENOUS
  Administered 2013-05-27: 100 ug via INTRAVENOUS
  Administered 2013-05-27 (×2): 50 ug via INTRAVENOUS

## 2013-05-27 MED ORDER — LACTATED RINGERS IV SOLN
INTRAVENOUS | Status: DC
  Administered 2013-05-27: 12:00:00 via INTRAVENOUS
  Administered 2013-05-27: 1000 mL via INTRAVENOUS

## 2013-05-27 MED ORDER — EPHEDRINE SULFATE 50 MG/ML IJ SOLN
INTRAMUSCULAR | Status: DC | PRN
  Administered 2013-05-27: 10 mg via INTRAVENOUS

## 2013-05-27 MED ORDER — LACTATED RINGERS IV SOLN: INTRAVENOUS | Status: DC

## 2013-05-27 MED ORDER — NEOSTIGMINE METHYLSULFATE 1 MG/ML IJ SOLN
INTRAMUSCULAR | Status: DC | PRN
  Administered 2013-05-27: 5 mg via INTRAVENOUS

## 2013-05-27 MED ORDER — BUPIVACAINE-EPINEPHRINE PF 0.25-1:200000 % IJ SOLN
INTRAMUSCULAR | Status: AC
  Filled 2013-05-27: qty 30

## 2013-05-27 MED ORDER — GLYCOPYRROLATE 0.2 MG/ML IJ SOLN
INTRAMUSCULAR | Status: DC | PRN
  Administered 2013-05-27: 0.6 mg via INTRAVENOUS

## 2013-05-27 MED ORDER — KCL IN DEXTROSE-NACL 20-5-0.45 MEQ/L-%-% IV SOLN
INTRAVENOUS | Status: AC
  Filled 2013-05-27: qty 1000

## 2013-05-27 MED ORDER — DOCUSATE SODIUM 100 MG PO CAPS
100.0000 mg | ORAL_CAPSULE | Freq: Two times a day (BID) | ORAL | Status: DC
  Administered 2013-05-27 – 2013-05-28 (×2): 100 mg via ORAL
  Filled 2013-05-27 (×3): qty 1

## 2013-05-27 MED ORDER — CEFAZOLIN SODIUM 1-5 GM-% IV SOLN
1.0000 g | Freq: Three times a day (TID) | INTRAVENOUS | Status: AC
  Administered 2013-05-27 – 2013-05-28 (×2): 1 g via INTRAVENOUS
  Filled 2013-05-27 (×2): qty 50

## 2013-05-27 MED ORDER — INDIGOTINDISULFONATE SODIUM 8 MG/ML IJ SOLN
INTRAMUSCULAR | Status: DC | PRN
  Administered 2013-05-27: 5 mL via INTRAVENOUS

## 2013-05-27 MED ORDER — PROPOFOL 10 MG/ML IV BOLUS
INTRAVENOUS | Status: DC | PRN
  Administered 2013-05-27: 200 mg via INTRAVENOUS

## 2013-05-27 MED ORDER — SODIUM CHLORIDE 0.9 % IV BOLUS (SEPSIS)
1000.0000 mL | Freq: Once | INTRAVENOUS | Status: AC
  Administered 2013-05-27: 1000 mL via INTRAVENOUS

## 2013-05-27 MED ORDER — MIDAZOLAM HCL 5 MG/5ML IJ SOLN
INTRAMUSCULAR | Status: DC | PRN
  Administered 2013-05-27: 2 mg via INTRAVENOUS

## 2013-05-27 MED ORDER — SODIUM CHLORIDE 0.9 % IR SOLN
Status: DC | PRN
  Administered 2013-05-27: 1000 mL via INTRAVESICAL

## 2013-05-27 MED ORDER — HYDROCODONE-ACETAMINOPHEN 5-325 MG PO TABS: 1.0000 | ORAL_TABLET | Freq: Four times a day (QID) | ORAL | Status: DC | PRN

## 2013-05-27 SURGICAL SUPPLY — 47 items
CANISTER SUCTION 2500CC (MISCELLANEOUS) ×2 IMPLANT
CATH FOLEY 2WAY SLVR 18FR 30CC (CATHETERS) ×2 IMPLANT
CATH ROBINSON RED A/P 16FR (CATHETERS) ×2 IMPLANT
CATH ROBINSON RED A/P 8FR (CATHETERS) ×2 IMPLANT
CATH TIEMANN FOLEY 18FR 5CC (CATHETERS) ×2 IMPLANT
CHLORAPREP W/TINT 26ML (MISCELLANEOUS) ×2 IMPLANT
CLIP LIGATING HEM O LOK PURPLE (MISCELLANEOUS) IMPLANT
CLOTH BEACON ORANGE TIMEOUT ST (SAFETY) ×2 IMPLANT
CORD HIGH FREQUENCY UNIPOLAR (ELECTROSURGICAL) ×2 IMPLANT
COVER SURGICAL LIGHT HANDLE (MISCELLANEOUS) ×2 IMPLANT
COVER TIP SHEARS 8 DVNC (MISCELLANEOUS) ×1 IMPLANT
COVER TIP SHEARS 8MM DA VINCI (MISCELLANEOUS) ×1
CUTTER ECHEON FLEX ENDO 45 340 (ENDOMECHANICALS) ×2 IMPLANT
DECANTER SPIKE VIAL GLASS SM (MISCELLANEOUS) IMPLANT
DERMABOND ADVANCED (GAUZE/BANDAGES/DRESSINGS) ×1
DERMABOND ADVANCED .7 DNX12 (GAUZE/BANDAGES/DRESSINGS) ×1 IMPLANT
DRAPE SURG IRRIG POUCH 19X23 (DRAPES) ×2 IMPLANT
DRSG TEGADERM 2-3/8X2-3/4 SM (GAUZE/BANDAGES/DRESSINGS) IMPLANT
DRSG TEGADERM 4X4.75 (GAUZE/BANDAGES/DRESSINGS) ×2 IMPLANT
DRSG TEGADERM 6X8 (GAUZE/BANDAGES/DRESSINGS) ×4 IMPLANT
ELECT REM PT RETURN 9FT ADLT (ELECTROSURGICAL) ×2
ELECTRODE REM PT RTRN 9FT ADLT (ELECTROSURGICAL) ×1 IMPLANT
GAUZE SPONGE 2X2 8PLY STRL LF (GAUZE/BANDAGES/DRESSINGS) ×1 IMPLANT
GLOVE BIO SURGEON STRL SZ 6.5 (GLOVE) ×2 IMPLANT
GLOVE BIOGEL M STRL SZ7.5 (GLOVE) ×4 IMPLANT
GOWN PREVENTION PLUS LG XLONG (DISPOSABLE) ×4 IMPLANT
GOWN STRL REIN XL XLG (GOWN DISPOSABLE) ×2 IMPLANT
HOLDER FOLEY CATH W/STRAP (MISCELLANEOUS) ×2 IMPLANT
IV LACTATED RINGERS 1000ML (IV SOLUTION) ×2 IMPLANT
KIT ACCESSORY DA VINCI DISP (KITS) ×1
KIT ACCESSORY DVNC DISP (KITS) ×1 IMPLANT
NDL SAFETY ECLIPSE 18X1.5 (NEEDLE) ×1 IMPLANT
NEEDLE HYPO 18GX1.5 SHARP (NEEDLE) ×1
PACK ROBOT UROLOGY CUSTOM (CUSTOM PROCEDURE TRAY) ×2 IMPLANT
RELOAD GREEN ECHELON 45 (STAPLE) ×2 IMPLANT
SET TUBE IRRIG SUCTION NO TIP (IRRIGATION / IRRIGATOR) ×2 IMPLANT
SOLUTION ELECTROLUBE (MISCELLANEOUS) ×2 IMPLANT
SPONGE GAUZE 2X2 STER 10/PKG (GAUZE/BANDAGES/DRESSINGS) ×1
SUT ETHILON 3 0 PS 1 (SUTURE) ×2 IMPLANT
SUT MNCRL AB 4-0 PS2 18 (SUTURE) IMPLANT
SUT VIC AB 3-0 SH 27 (SUTURE) ×1
SUT VIC AB 3-0 SH 27X BRD (SUTURE) ×1 IMPLANT
SUT VICRYL 0 UR6 27IN ABS (SUTURE) ×4 IMPLANT
SYR 27GX1/2 1ML LL SAFETY (SYRINGE) ×2 IMPLANT
TOWEL OR 17X26 10 PK STRL BLUE (TOWEL DISPOSABLE) ×2 IMPLANT
TOWEL OR NON WOVEN STRL DISP B (DISPOSABLE) ×2 IMPLANT
WATER STERILE IRR 1500ML POUR (IV SOLUTION) ×4 IMPLANT

## 2013-05-27 NOTE — Op Note (Signed)
Preoperative diagnosis: Clinically localized adenocarcinoma of the prostate (clinical stage T1c Nx Mx)  Postoperative diagnosis: Clinically localized adenocarcinoma of the prostate (clinical stage T1c Nx Mx)  Procedure:  1. Robotic assisted laparoscopic radical prostatectomy (bilateral nerve sparing)  Surgeon: Rolly Salter, Montez Hageman. M.D.  Assistant: Pecola Leisure, PA-C  Anesthesia: General  EBL: 100 mL  IVF:  1400 mL crystalloid  Specimens: 1. Prostate and seminal vesicles  Disposition of specimens: Pathology  Drains: 1. 20 Fr coude catheter 2. # 19 Blake pelvic drain  Indication: Howard [name] is a 62 y.o. year old patient with clinically localized prostate cancer.  After a thorough review of the management options for treatment of prostate cancer, he elected to proceed with surgical therapy and the above procedure(s).  We have discussed the potential benefits and risks of the procedure, side effects of the proposed treatment, the likelihood of the patient achieving the goals of the procedure, and any potential problems that might occur during the procedure or recuperation. Informed consent has been obtained.  Description of procedure:  The patient was taken to the operating room and a general anesthetic was administered. He was given preoperative antibiotics, placed in the dorsal lithotomy position, and prepped and draped in the usual sterile fashion. Next a preoperative timeout was performed. A urethral catheter was placed into the bladder and a site was selected near the umbilicus for placement of the camera port. This was placed using a standard open Hassan technique which allowed entry into the peritoneal cavity under direct vision and without difficulty. A 12 mm port was placed and a pneumoperitoneum established. The camera was then used to inspect the abdomen and there was no evidence of any intra-abdominal injuries or other abnormalities. The remaining abdominal ports  were then placed. 8 mm robotic ports were placed in the right lower quadrant, left lower quadrant, and far left lateral abdominal wall. A 5 mm port was placed in the right upper quadrant and a 12 mm port was placed in the right lateral abdominal wall for laparoscopic assistance. All ports were placed under direct vision without difficulty. The surgical cart was then docked.   Utilizing the cautery scissors, the bladder was reflected posteriorly allowing entry into the space of Retzius and identification of the endopelvic fascia and prostate. The periprostatic fat was then removed from the prostate allowing full exposure of the endopelvic fascia. The endopelvic fascia was then incised from the apex back to the base of the prostate bilaterally and the underlying levator muscle fibers were swept laterally off the prostate thereby isolating the dorsal venous complex. The dorsal vein was then stapled and divided with a 45 mm Flex Echelon stapler. Attention then turned to the bladder neck which was divided anteriorly thereby allowing entry into the bladder and exposure of the urethral catheter. The catheter balloon was deflated and the catheter was brought into the operative field and used to retract the prostate anteriorly. The posterior bladder neck was then examined and was divided allowing further dissection between the bladder and prostate posteriorly until the vasa deferentia and seminal vessels were identified. During this dissection, the posterior bladder was entered.  This was immediately identified and was repaired with a 3-0 vicryl figure of eight suture in two layers.  Indigo carmine was administered and the ureters were seen to be effluxing bilaterally and well away from the repair. The vasa deferentia were isolated, divided, and lifted anteriorly. The seminal vesicles were dissected down to their tips with care to control  the seminal vascular arterial blood supply. These structures were then lifted  anteriorly and the space between Denonvillier's fascia and the anterior rectum was developed with a combination of sharp and blunt dissection. This isolated the vascular pedicles of the prostate.  The lateral prostatic fascia was then sharply incised allowing release of the neurovascular bundles bilaterally. The vascular pedicles of the prostate were then ligated with Weck clips between the prostate and neurovascular bundles and divided with sharp cold scissor dissection resulting in neurovascular bundle preservation. The neurovascular bundles were then separated off the apex of the prostate and urethra bilaterally.  The urethra was then sharply transected allowing the prostate specimen to be disarticulated. The pelvis was copiously irrigated and hemostasis was ensured. There was no evidence for rectal injury.  Attention then turned to the urethral anastomosis. A 2-0 Vicryl slip knot was placed between Denonvillier's fascia, the posterior bladder neck, and the posterior urethra to reapproximate these structures. A double-armed 3-0 Monocryl suture was then used to perform a 360 running tension-free anastomosis between the bladder neck and urethra. A new urethral catheter was then placed into the bladder and irrigated. There were no blood clots within the bladder and the anastomosis appeared to be watertight. A #19 Blake drain was then brought through the left lateral 8 mm port site and positioned appropriately within the pelvis. It was secured to the skin with a nylon suture. The surgical cart was then undocked. The right lateral 12 mm port site was closed at the fascial level with a 0 Vicryl suture placed laparoscopically. All remaining ports were then removed under direct vision. The prostate specimen was removed intact within the Endopouch retrieval bag via the periumbilical camera port site. This fascial opening was closed with two running 0 Vicryl sutures. 0.25% Marcaine was then injected into all port  sites and all incisions were reapproximated at the skin level with staples. Sterile dressings were applied. The patient appeared to tolerate the procedure well. The patient was able to be extubated and transferred to the recovery unit in satisfactory condition.  Moody Bruins MD

## 2013-05-27 NOTE — Anesthesia Postprocedure Evaluation (Signed)
Anesthesia Post Note  Patient: Howard [name]  Procedure(s) Performed: Procedure(s) (LRB): ROBOTIC ASSISTED LAPAROSCOPIC RADICAL PROSTATECTOMY LEVEL 1 (N/A)  Anesthesia type: General  Patient location: PACU  Post pain: Pain level controlled  Post assessment: Post-op Vital signs reviewed  Last Vitals:  Filed Vitals:   05/27/13 1426  BP: 125/81  Pulse: 67  Temp: 36.1 C  Resp: 16    Post vital signs: Reviewed  Level of consciousness: sedated  Complications: No apparent anesthesia complications

## 2013-05-27 NOTE — Transfer of Care (Signed)
Immediate Anesthesia Transfer of Care Note  Patient: Howard [name]  Procedure(s) Performed: Procedure(s): ROBOTIC ASSISTED LAPAROSCOPIC RADICAL PROSTATECTOMY LEVEL 1 (N/A)  Patient Location: PACU  Anesthesia Type:General  Level of Consciousness: awake, alert  and patient cooperative  Airway & Oxygen Therapy: Patient Spontanous Breathing and Patient connected to face mask oxygen  Post-op Assessment: Report given to PACU RN, Post -op Vital signs reviewed and stable and Patient moving all extremities X 4  Post vital signs: Reviewed and stable  Complications: No apparent anesthesia complications

## 2013-05-27 NOTE — Anesthesia Preprocedure Evaluation (Signed)
Anesthesia Evaluation  Patient identified by MRN, date of birth, ID band Patient awake    Reviewed: Allergy & Precautions, H&P , NPO status , Patient's Chart, lab work & pertinent test results  Airway Mallampati: II TM Distance: >3 FB Neck ROM: Full    Dental  (+) Teeth Intact and Dental Advisory Given   Pulmonary neg pulmonary ROS,  breath sounds clear to auscultation  Pulmonary exam normal       Cardiovascular hypertension, Pt. on medications Rhythm:Regular Rate:Normal     Neuro/Psych negative neurological ROS  negative psych ROS   GI/Hepatic Neg liver ROS, GERD-  ,  Endo/Other  negative endocrine ROS  Renal/GU negative Renal ROS  negative genitourinary   Musculoskeletal negative musculoskeletal ROS (+)   Abdominal   Peds  Hematology negative hematology ROS (+)   Anesthesia Other Findings   Reproductive/Obstetrics                           Anesthesia Physical Anesthesia Plan  ASA: II  Anesthesia Plan: General   Post-op Pain Management:    Induction: Intravenous  Airway Management Planned: Oral ETT  Additional Equipment:   Intra-op Plan:   Post-operative Plan: Extubation in OR  Informed Consent: I have reviewed the patients History and Physical, chart, labs and discussed the procedure including the risks, benefits and alternatives for the proposed anesthesia with the patient or authorized representative who has indicated his/her understanding and acceptance.   Dental advisory given  Plan Discussed with: CRNA  Anesthesia Plan Comments:         Anesthesia Quick Evaluation

## 2013-05-27 NOTE — Progress Notes (Signed)
Patient ID: Howard [name], male   DOB: [birth year], 62 y.o.   MRN: [id number]  Post-op note  Subjective: The patient is doing well.  No complaints.  Objective: Vital signs in last 24 hours: Temp:  [97 F (36.1 C)-97.7 F (36.5 C)] 97 F (36.1 C) (10/13 1426) Pulse Rate:  [57-73] 67 (10/13 1426) Resp:  [6-18] 16 (10/13 1426) BP: (125-136)/(77-90) 125/81 mmHg (10/13 1426) SpO2:  [97 %-100 %] 97 % (10/13 1426) Weight:  [89.812 kg (198 lb)] 89.812 kg (198 lb) (10/13 1005)  Intake/Output from previous day:   Intake/Output this shift: Total I/O In: 2400 [I.V.:1400; IV Piggyback:1000] Out: 235 [Urine:125; Drains:10; Blood:100]  Physical Exam:  General: Alert and oriented. Abdomen: Soft, Nondistended. Incisions: Clean and dry.  Lab Results:  Recent Labs  05/27/13 1304  HGB 13.0  HCT 37.7*    Assessment/Plan: POD#0   1) Continue to monitor   Moody Bruins. MD   LOS: 0 days   Savahanna Almendariz,LES 05/27/2013, 5:23 PM

## 2013-05-28 ENCOUNTER — Encounter (HOSPITAL_COMMUNITY): Payer: Self-pay | Admitting: Urology

## 2013-05-28 MED ORDER — HYDROCODONE-ACETAMINOPHEN 5-325 MG PO TABS
1.0000 | ORAL_TABLET | Freq: Four times a day (QID) | ORAL | Status: DC | PRN
  Administered 2013-05-28: 2 via ORAL
  Filled 2013-05-28: qty 2

## 2013-05-28 MED ORDER — BISACODYL 10 MG RE SUPP
10.0000 mg | Freq: Once | RECTAL | Status: AC
  Administered 2013-05-28: 08:00:00 10 mg via RECTAL
  Filled 2013-05-28: qty 1

## 2013-05-28 MED ORDER — ONDANSETRON HCL 4 MG/2ML IJ SOLN
4.0000 mg | INTRAMUSCULAR | Status: DC | PRN
  Administered 2013-05-28: 04:00:00 4 mg via INTRAVENOUS
  Filled 2013-05-28: qty 2

## 2013-05-28 NOTE — Care Management Note (Signed)
   CARE MANAGEMENT NOTE 05/28/2013  Patient:  NETANEL, YANNUZZI   Account Number:  [id number]  Date Initiated:  05/28/2013  Documentation initiated by:  Gyasi Hazzard  Subjective/Objective Assessment:   62 yo male admitted with Prostate Ca. S/P robotic assisted laparoscopic radical prostatectomy.     Action/Plan:   Home when stable   Anticipated DC Date:  05/28/2013   Anticipated DC Plan:  HOME/SELF CARE      DC Planning Services  CM consult      Choice offered to / List presented to:  NA   DME arranged  NA      DME agency  NA     HH arranged  NA      HH agency  NA   Status of service:  Completed, signed off Medicare Important Message given?   (If response is "NO", the following Medicare IM given date fields will be blank) Date Medicare IM given:   Date Additional Medicare IM given:    Discharge Disposition:    Per UR Regulation:  Reviewed for med. necessity/level of care/duration of stay  If discussed at Long Length of Stay Meetings, dates discussed:    Comments:  05/28/13 1212 Estel Scholze,RN,MSN [phone] Chart reiwewed for utilization of services. No needs identified prior to discharge.

## 2013-05-28 NOTE — Progress Notes (Signed)
Patient ID: Howard [name], male   DOB: 05/02/1951, 62 y.o.   MRN: [id number] 1 Day Post-Op Subjective: The patient is doing well.  No nausea or vomiting. Pain is adequately controlled.  Objective: Vital signs in last 24 hours: Temp:  [97 F (36.1 C)-98.6 F (37 C)] 98.6 F (37 C) (10/14 0549) Pulse Rate:  [57-73] 57 (10/14 0549) Resp:  [6-18] 18 (10/14 0549) BP: (119-136)/(75-90) 122/77 mmHg (10/14 0549) SpO2:  [97 %-100 %] 98 % (10/14 0549) Weight:  [89.812 kg (198 lb)] 89.812 kg (198 lb) (10/13 1005)  Intake/Output from previous day: 10/13 0701 - 10/14 0700 In: 5057.5 [P.O.:360; I.V.:3647.5; IV Piggyback:1050] Out: 2165 [Urine:2025; Drains:40; Blood:100] Intake/Output this shift:    Physical Exam:  General: Alert and oriented. CV: RRR Lungs: Clear bilaterally. GI: Soft, Nondistended. Incisions: Dressings intact. Urine: Clear Extremities: Nontender, no erythema, no edema.  Lab Results:  Recent Labs  05/27/13 1304 05/28/13 0348  HGB 13.0 12.3*  HCT 37.7* 36.5*      Assessment/Plan: POD# 1 s/p robotic prostatectomy.  1) SL IVF 2) Ambulate, Incentive spirometry 3) Transition to oral pain medication 4) Dulcolax suppository 5) D/C pelvic drain 6) Plan for likely discharge later today   Howard [name]. MD   LOS: 1 day   Howard [name],LES 05/28/2013, 7:02 AM

## 2013-05-28 NOTE — Discharge Summary (Signed)
  Date of admission: 05/27/2013  Date of discharge: 05/28/2013  Admission diagnosis: Prostate Cancer  Discharge diagnosis: Prostate Cancer  History and Physical: For full details, please see admission history and physical. Briefly, Howard [name] is a 62 y.o. gentleman with localized prostate cancer.  After discussing management/treatment options, he elected to proceed with surgical treatment.  Hospital Course: Howard [name] was taken to the operating room on 05/27/2013 and underwent a robotic assisted laparoscopic radical prostatectomy. He tolerated this procedure well and without complications. Postoperatively, he was able to be transferred to a regular hospital room following recovery from anesthesia.  He was able to begin ambulating the night of surgery. He remained hemodynamically stable overnight.  He had excellent urine output with appropriately minimal output from his pelvic drain and his pelvic drain was removed on POD #1.  He was transitioned to oral pain medication, tolerated a clear liquid diet, and had met all discharge criteria and was able to be discharged home later on POD#1.  Laboratory values:  Recent Labs  05/27/13 1304 05/28/13 0348  HGB 13.0 12.3*  HCT 37.7* 36.5*    Disposition: Home  Discharge instruction: He was instructed to be ambulatory but to refrain from heavy lifting, strenuous activity, or driving. He was instructed on urethral catheter care.  Discharge medications:     Medication List    STOP taking these medications       tadalafil 20 MG tablet  Commonly known as:  CIALIS      TAKE these medications       candesartan 16 MG tablet  Commonly known as:  ATACAND  Take 8 mg by mouth every morning.     ciprofloxacin 500 MG tablet  Commonly known as:  CIPRO  Take 1 tablet (500 mg total) by mouth 2 (two) times daily. Start day prior to office visit for foley removal     HYDROcodone-acetaminophen 5-325 MG per tablet  Commonly known as:   NORCO  Take 1-2 tablets by mouth every 6 (six) hours as needed for pain.     ZEGERID OTC 20-1100 MG Caps capsule  Generic drug:  Omeprazole-Sodium Bicarbonate  Take 1 capsule by mouth daily before breakfast.        Followup: He will followup in 1 week for catheter removal and to discuss his surgical pathology results.

## 2014-01-20 ENCOUNTER — Other Ambulatory Visit: Payer: Self-pay | Admitting: Family Medicine

## 2014-01-27 ENCOUNTER — Encounter: Payer: Self-pay | Admitting: Family Medicine

## 2014-01-27 ENCOUNTER — Ambulatory Visit (INDEPENDENT_AMBULATORY_CARE_PROVIDER_SITE_OTHER): Payer: 59 | Admitting: Family Medicine

## 2014-01-27 VITALS — BP 104/70 | HR 68 | Temp 98.1°F | Wt 197.5 lb

## 2014-01-27 DIAGNOSIS — H109 Unspecified conjunctivitis: Secondary | ICD-10-CM

## 2014-01-27 DIAGNOSIS — N529 Male erectile dysfunction, unspecified: Secondary | ICD-10-CM

## 2014-01-27 MED ORDER — CIPROFLOXACIN HCL 0.3 % OP SOLN: OPHTHALMIC | Status: DC

## 2014-01-27 MED ORDER — TADALAFIL 20 MG PO TABS: 10.0000 mg | ORAL_TABLET | ORAL | Status: DC | PRN

## 2014-01-27 NOTE — Progress Notes (Signed)
Pre visit review using our clinic review tool, if applicable. No additional management support is needed unless otherwise documented below in the visit note.  R eye sx for the last few days.  Burning near the lateral junction of the lids.  No FB known.  Some discharge from the eye.  R eye red, gritty feeling.  No FCNAVD.  No rhinorrhea. No ear pain.  No vision changes.  No L eye sx.  Wears glasses, no contacts.    Prostate cancer, now with ED. Asking about options.  cialis helped prev.   Meds, vitals, and allergies reviewed.   ROS: See HPI.  Otherwise, noncontributory.  nad ncat Tm wnl Nasal and OP exam wnl R eye injected with scant discharge.  Fundus wnl B, no FB B Normal exam with wood's lamp, no scratch or defect PERRL EOMI 20/25 -1 L and R eye.

## 2014-01-27 NOTE — Patient Instructions (Addendum)
Schedule a physical on the way out today. Start the eye drops today and take care.  Glad to see you.

## 2014-01-28 DIAGNOSIS — H109 Unspecified conjunctivitis: Secondary | ICD-10-CM | POA: Insufficient documentation

## 2014-01-28 NOTE — Assessment & Plan Note (Signed)
Presumed, unilateral. Start ciloxan and f/u prn. He agrees.

## 2014-01-28 NOTE — Assessment & Plan Note (Signed)
rx given for cialis.  Has used prev.

## 2014-02-21 ENCOUNTER — Encounter: Payer: Self-pay | Admitting: Family Medicine

## 2014-02-21 ENCOUNTER — Ambulatory Visit (INDEPENDENT_AMBULATORY_CARE_PROVIDER_SITE_OTHER): Payer: 59 | Admitting: Family Medicine

## 2014-02-21 VITALS — BP 102/64 | HR 76 | Temp 97.9°F | Wt 195.0 lb

## 2014-02-21 DIAGNOSIS — H6120 Impacted cerumen, unspecified ear: Secondary | ICD-10-CM

## 2014-02-21 NOTE — Patient Instructions (Signed)
Take care.  Glad to see you.  

## 2014-02-21 NOTE — Progress Notes (Signed)
Pre visit review using our clinic review tool, if applicable. No additional management support is needed unless otherwise documented below in the visit note.  L ear sx.  Started yesterday.  Was helping at a friends house and then the L ear felt stopped up.  No ear plug use but had used an ear piece prev.  Used peroxide in his ear yesterday.  No R ear sx.  Hearing is okay, at baseline. No FCNAVD.  Doing on vacation next week.    Meds, vitals, and allergies reviewed.   ROS: See HPI.  Otherwise, noncontributory.  nad ncat TM wnl B.  L ear with cerumen impaction.  Removed with curette.  No ETD on exam after cerumen removed.  Tolerated well, no complication.

## 2014-02-22 ENCOUNTER — Other Ambulatory Visit: Payer: Self-pay | Admitting: Family Medicine

## 2014-02-23 DIAGNOSIS — H6120 Impacted cerumen, unspecified ear: Secondary | ICD-10-CM | POA: Insufficient documentation

## 2014-02-23 NOTE — Assessment & Plan Note (Signed)
Now resolved, f/u prn.

## 2014-03-02 ENCOUNTER — Other Ambulatory Visit: Payer: Self-pay | Admitting: Family Medicine

## 2014-03-02 DIAGNOSIS — C61 Malignant neoplasm of prostate: Secondary | ICD-10-CM

## 2014-03-02 DIAGNOSIS — I10 Essential (primary) hypertension: Secondary | ICD-10-CM

## 2014-03-03 ENCOUNTER — Other Ambulatory Visit (INDEPENDENT_AMBULATORY_CARE_PROVIDER_SITE_OTHER): Payer: 59

## 2014-03-03 DIAGNOSIS — C61 Malignant neoplasm of prostate: Secondary | ICD-10-CM

## 2014-03-03 DIAGNOSIS — I10 Essential (primary) hypertension: Secondary | ICD-10-CM

## 2014-03-04 LAB — LIPID PANEL
CHOL/HDL RATIO: 5
Cholesterol: 233 mg/dL — ABNORMAL HIGH (ref 0–200)
HDL: 46.2 mg/dL (ref >39.00)
LDL Cholesterol: 163 mg/dL — ABNORMAL HIGH (ref 0–99)
NonHDL: 186.8
TRIGLYCERIDES: 121 mg/dL (ref 0.0–149.0)
VLDL: 24.2 mg/dL (ref 0.0–40.0)

## 2014-03-04 LAB — COMPREHENSIVE METABOLIC PANEL
ALT: 12 U/L (ref 0–53)
AST: 19 U/L (ref 0–37)
Albumin: 4.1 g/dL (ref 3.5–5.2)
Alkaline Phosphatase: 102 U/L (ref 39–117)
BILIRUBIN TOTAL: 0.8 mg/dL (ref 0.2–1.2)
BUN: 16 mg/dL (ref 6–23)
CHLORIDE: 100 meq/L (ref 96–112)
CO2: 26 mEq/L (ref 19–32)
CREATININE: 1.2 mg/dL (ref 0.4–1.5)
Calcium: 10.2 mg/dL (ref 8.4–10.5)
GFR: 66.02 mL/min (ref >60.00)
Glucose, Bld: 96 mg/dL (ref 70–99)
Potassium: 5.3 mEq/L — ABNORMAL HIGH (ref 3.5–5.1)
Sodium: 135 mEq/L (ref 135–145)
Total Protein: 7.3 g/dL (ref 6.0–8.3)

## 2014-03-04 LAB — PSA: PSA: 0.01 ng/mL — ABNORMAL LOW (ref 0.10–4.00)

## 2014-03-06 ENCOUNTER — Ambulatory Visit (INDEPENDENT_AMBULATORY_CARE_PROVIDER_SITE_OTHER): Payer: 59 | Admitting: Family Medicine

## 2014-03-06 ENCOUNTER — Encounter: Payer: Self-pay | Admitting: Family Medicine

## 2014-03-06 VITALS — BP 108/70 | HR 73 | Temp 97.5°F | Ht 69.0 in | Wt 196.0 lb

## 2014-03-06 DIAGNOSIS — E7800 Pure hypercholesterolemia, unspecified: Secondary | ICD-10-CM

## 2014-03-06 DIAGNOSIS — I10 Essential (primary) hypertension: Secondary | ICD-10-CM

## 2014-03-06 DIAGNOSIS — Z0000 Encounter for general adult medical examination without abnormal findings: Secondary | ICD-10-CM

## 2014-03-06 NOTE — Progress Notes (Signed)
Pre visit review using our clinic review tool, if applicable. No additional management support is needed unless otherwise documented below in the visit note.  CPE- See plan.  Routine anticipatory guidance given to patient.  See health maintenance. Tetanus 2009 Shingles shot 2011 Flu prev done.  PNA shot due at 64.  Diet and exercise d/w pt.  "Pretty good."   PSA 0.  Colon 2012.  Living will d/w pt.  Wife designated if patient were incapacitated.    Hypertension:    Using medication without problems or lightheadedness: yes Chest pain with exertion:no Edema:no Short of breath:no Labs d/w pt.    LDL up.  Had been eating more potato chips.  D/w pt.  We agreed to recheck later on after he works on diet.   Mild uri sx recently.    Exercise limited recent by likely abd strain.   PMH and SH reviewed  Meds, vitals, and allergies reviewed.   ROS: See HPI.  Otherwise negative.    GEN: nad, alert and oriented HEENT: mucous membranes moist, Op with minimal cobblestoning, minimal nasal congestion.  NECK: supple w/o LA CV: rrr. PULM: ctab, no inc wob ABD: soft, +bs EXT: no edema SKIN: no acute rash

## 2014-03-06 NOTE — Patient Instructions (Addendum)
Cut back on fatty foods and recheck fasting lipids in about 4 months at a lab visit.  Gradually work back into your abdominal exercises.  You likely strained a muscle previously.  Take care. Glad to see you.  Nyquil may help the cold symptoms.  Drink plenty of fluids in the meantime.

## 2014-03-07 NOTE — Assessment & Plan Note (Signed)
He'll work on diet, to help lipids.  Continue BP meds as is.  Labs d/w pt. He agrees.

## 2014-03-07 NOTE — Assessment & Plan Note (Addendum)
Routine anticipatory guidance given to patient.  See health maintenance. Tetanus 2009 Shingles shot 2011 Flu prev done.  PNA shot due at 64.  Diet and exercise d/w pt.  "Pretty good."   PSA 0.  Colon 2012.  Living will d/w pt.  Wife designated if patient were incapacitated.   Mild uri sx should resolve with time.

## 2014-03-27 ENCOUNTER — Other Ambulatory Visit: Payer: Self-pay | Admitting: Family Medicine

## 2014-07-07 ENCOUNTER — Other Ambulatory Visit (INDEPENDENT_AMBULATORY_CARE_PROVIDER_SITE_OTHER): Payer: 59

## 2014-07-07 DIAGNOSIS — E7800 Pure hypercholesterolemia, unspecified: Secondary | ICD-10-CM

## 2014-07-07 LAB — LIPID PANEL
CHOL/HDL RATIO: 5
Cholesterol: 231 mg/dL — ABNORMAL HIGH (ref 0–200)
HDL: 42.6 mg/dL (ref >39.00)
LDL CALC: 162 mg/dL — AB (ref 0–99)
NonHDL: 188.4
TRIGLYCERIDES: 133 mg/dL (ref 0.0–149.0)
VLDL: 26.6 mg/dL (ref 0.0–40.0)

## 2014-07-14 ENCOUNTER — Other Ambulatory Visit: Payer: Self-pay | Admitting: Family Medicine

## 2014-07-14 DIAGNOSIS — E785 Hyperlipidemia, unspecified: Secondary | ICD-10-CM

## 2014-07-14 MED ORDER — ATORVASTATIN CALCIUM 20 MG PO TABS: 20.0000 mg | ORAL_TABLET | Freq: Every day | ORAL | Status: DC

## 2014-07-14 NOTE — Progress Notes (Signed)
Please see result note 

## 2014-08-04 ENCOUNTER — Ambulatory Visit (INDEPENDENT_AMBULATORY_CARE_PROVIDER_SITE_OTHER): Payer: 59 | Admitting: Family Medicine

## 2014-08-04 ENCOUNTER — Encounter: Payer: Self-pay | Admitting: Family Medicine

## 2014-08-04 VITALS — BP 104/82 | HR 79 | Temp 98.9°F | Wt 199.5 lb

## 2014-08-04 DIAGNOSIS — J310 Chronic rhinitis: Secondary | ICD-10-CM | POA: Insufficient documentation

## 2014-08-04 DIAGNOSIS — E785 Hyperlipidemia, unspecified: Secondary | ICD-10-CM | POA: Insufficient documentation

## 2014-08-04 DIAGNOSIS — J3089 Other allergic rhinitis: Secondary | ICD-10-CM

## 2014-08-04 MED ORDER — AMOXICILLIN-POT CLAVULANATE 875-125 MG PO TABS: 1.0000 | ORAL_TABLET | Freq: Two times a day (BID) | ORAL | Status: DC

## 2014-08-04 NOTE — Progress Notes (Signed)
Pre visit review using our clinic review tool, if applicable. No additional management support is needed unless otherwise documented below in the visit note.  He had a cracked tooth (L upper molar) in 06/2014.  It was extracted, the site was packed.  Over the last few weeks, had more pain on the L max sinus.  Rhinorrhea. No fevers.  No other tooth pain.  No ear pain.  L ear feels stuffy. No ST.  Some post nasal gtt, clear rhinorrhea. No rash.  No cough.  The tooth extraction site is healed over.    D/w pt about his prev lipids. He'll consider statin.  He wants to work on diet in the meantime.   Meds, vitals, and allergies reviewed.   ROS: See HPI.  Otherwise, noncontributory.  GEN: nad, alert and oriented HEENT: mucous membranes moist, tm w/o erythema, nasal exam w/o erythema, clear discharge noted,  OP with cobblestoning, L max sinus minimally ttp, L upper molar extraction site appears healed w/o abscess or sign of infection NECK: supple w/o LA CV: rrr.   PULM: ctab, no inc wob EXT: no edema SKIN: no acute rash

## 2014-08-04 NOTE — Assessment & Plan Note (Signed)
D/w pt about statin.  He hasn't been on meds.  Talked about potential side effects vs benefits.   He'll consider. He'll work on diet.  D/w pt about AHA resources.

## 2014-08-04 NOTE — Patient Instructions (Signed)
Get over the counter flonase and use 2 sprays in each nostril once a day (total of four sprays a day). If you are getting worse, then start the antibiotics. Look at the [place] heart association website for diet tips.  Take care.  Glad to see you.

## 2014-08-04 NOTE — Assessment & Plan Note (Signed)
Not likely sinusitis at this point.  Start flonase.  If sig worsening, then start augmentin.   D/w pt.  He agrees.

## 2014-10-13 ENCOUNTER — Ambulatory Visit (INDEPENDENT_AMBULATORY_CARE_PROVIDER_SITE_OTHER): Payer: 59 | Admitting: Family Medicine

## 2014-10-13 ENCOUNTER — Encounter: Payer: Self-pay | Admitting: Family Medicine

## 2014-10-13 VITALS — BP 102/70 | HR 60 | Temp 98.3°F | Wt 200.0 lb

## 2014-10-13 DIAGNOSIS — M545 Low back pain: Secondary | ICD-10-CM

## 2014-10-13 MED ORDER — CYCLOBENZAPRINE HCL 10 MG PO TABS: 5.0000 mg | ORAL_TABLET | Freq: Three times a day (TID) | ORAL | Status: DC | PRN

## 2014-10-13 NOTE — Progress Notes (Signed)
Pre visit review using our clinic review tool, if applicable. No additional management support is needed unless otherwise documented below in the visit note.  Back pain.  Worked in the yard this past weekend.  Then 1AM today he had pain, worse in the meantime.  B lower back, no radicular pain.  No FCNAVD.  No falls.  No B/B sx.  Can't take ibuprofen.  Tylenol helped a little.  Has not used a heating pad yet.  Pain with twisting but not with flexion or extension.    He has f/u with Dr. Alinda Money pending.    Meds, vitals, and allergies reviewed.   ROS: See HPI.  Otherwise, noncontributory.  nad rrr ctab Back not ttp, no midline pain, no bruising, no rash SLR neg B S/S wnl for the BLE

## 2014-10-13 NOTE — Patient Instructions (Signed)
Use a heating pad, take tylenol as needed, and use flexeril as needed (sedation caution).  Gently stretch daily.  Take care.  Glad to see you.

## 2014-10-14 DIAGNOSIS — M545 Low back pain, unspecified: Secondary | ICD-10-CM | POA: Insufficient documentation

## 2014-10-14 NOTE — Assessment & Plan Note (Signed)
D/w about using a heating pad, take tylenol as needed, and use flexeril as needed (sedation caution). Gently stretch daily.  Likely benign MSK strain.  No need for imaging.

## 2014-10-30 ENCOUNTER — Encounter: Payer: Self-pay | Admitting: Family Medicine

## 2014-10-30 LAB — PSA: PSA: <0.01

## 2014-12-09 ENCOUNTER — Ambulatory Visit (INDEPENDENT_AMBULATORY_CARE_PROVIDER_SITE_OTHER): Payer: 59 | Admitting: Family Medicine

## 2014-12-09 ENCOUNTER — Ambulatory Visit (INDEPENDENT_AMBULATORY_CARE_PROVIDER_SITE_OTHER)
Admission: RE | Admit: 2014-12-09 | Discharge: 2014-12-09 | Disposition: A | Discharge status: Home / Self Care | Payer: 59 | Source: Ambulatory Visit | Attending: Family Medicine | Admitting: Family Medicine

## 2014-12-09 ENCOUNTER — Encounter: Payer: Self-pay | Admitting: Family Medicine

## 2014-12-09 VITALS — BP 110/72 | HR 75 | Temp 98.6°F | Wt 201.8 lb

## 2014-12-09 DIAGNOSIS — M25561 Pain in right knee: Secondary | ICD-10-CM | POA: Diagnosis not present

## 2014-12-09 MED ORDER — HYDROCODONE-ACETAMINOPHEN 5-325 MG PO TABS: 1.0000 | ORAL_TABLET | Freq: Three times a day (TID) | ORAL | Status: DC | PRN

## 2014-12-09 NOTE — Patient Instructions (Signed)
We'll contact you with your xray report. Take vicodin for pain, sedation caution.   Use ice on your knee, 5 minutes on, then 5 minutes off.   Update me in a few days if not better.  Take care.  Glad to see you.

## 2014-12-09 NOTE — Progress Notes (Signed)
Pre visit review using our clinic review tool, if applicable. No additional management support is needed unless otherwise documented below in the visit note.  R, not L, knee pain.  Started yesterday.  Not trigger, no trauma.  Puffy today.  Not bruising seen.  No falls.  Pain walking, pain bending.  Can't get straight or much past 90 deg due to pain. No popping, no locking, no clicking.   He was recently squatting a lot on his knee, working under a sink.    Meds, vitals, and allergies reviewed.   ROS: See HPI.  Otherwise, noncontributory.  nad R knee puffy but no bruising.  Has pain with ROM, pain with full ext and also with 90 deg flex but not locking.   Calf shin and thigh not ttp Puffy and ttp along the joint line medial > lateral.   ACL feels solid.

## 2014-12-10 DIAGNOSIS — M25569 Pain in unspecified knee: Secondary | ICD-10-CM | POA: Insufficient documentation

## 2014-12-10 NOTE — Assessment & Plan Note (Signed)
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  Likely with bursitis.   Can't take nsaids, wouldn't use pred.  Not not attempt injection or drainage.  D/w pt.  Use ice, vicodin prn.  Sedation caution, update me as needed.  He agrees.

## 2014-12-12 ENCOUNTER — Telehealth: Payer: Self-pay | Admitting: Family Medicine

## 2014-12-12 DIAGNOSIS — M25569 Pain in unspecified knee: Secondary | ICD-10-CM

## 2014-12-12 NOTE — Telephone Encounter (Signed)
Pt is still having trouble with knee and would like a knee specialist. Please call pt back at home number. Thanks.

## 2014-12-12 NOTE — Telephone Encounter (Signed)
Ordered. Thanks

## 2014-12-12 NOTE — Telephone Encounter (Signed)
Patient advised.

## 2015-02-12 ENCOUNTER — Ambulatory Visit (INDEPENDENT_AMBULATORY_CARE_PROVIDER_SITE_OTHER): Payer: Medicare Other | Admitting: Family Medicine

## 2015-02-12 ENCOUNTER — Encounter: Payer: Self-pay | Admitting: Family Medicine

## 2015-02-12 VITALS — BP 102/76 | HR 72 | Temp 98.7°F | Wt 197.5 lb

## 2015-02-12 DIAGNOSIS — N529 Male erectile dysfunction, unspecified: Secondary | ICD-10-CM

## 2015-02-12 DIAGNOSIS — N528 Other male erectile dysfunction: Secondary | ICD-10-CM | POA: Diagnosis not present

## 2015-02-12 DIAGNOSIS — L723 Sebaceous cyst: Secondary | ICD-10-CM | POA: Diagnosis not present

## 2015-02-12 MED ORDER — TADALAFIL 5 MG PO TABS: 5.0000 mg | ORAL_TABLET | Freq: Every day | ORAL | Status: DC

## 2015-02-12 NOTE — Patient Instructions (Addendum)
Warm compresses a few times a day.  Gently squeeze the area.  If the spot gets bigger or red or painful, then I can drain it.  Try cialis daily and see if that helps.  Keep the follow up appointment with urology.

## 2015-02-12 NOTE — Progress Notes (Signed)
Pre visit review using our clinic review tool, if applicable. No additional management support is needed unless otherwise documented below in the visit note.  Small seb cyst on upper back.  Had drained some.  Not painful.  No fevers.  No redness.    ED after prostate surgery. Asking about options. Can tolerate cialis but asking about daily dosing.    Meds, vitals, and allergies reviewed.   ROS: See HPI.  Otherwise, noncontributory.  nad Small seb cyst on upper back. Small amount of seb material expressed.  Not red, not ttp

## 2015-02-13 DIAGNOSIS — L723 Sebaceous cyst: Secondary | ICD-10-CM | POA: Insufficient documentation

## 2015-02-13 NOTE — Assessment & Plan Note (Signed)
Okay to try daily cialis and f/u with uro. He agrees.

## 2015-02-13 NOTE — Assessment & Plan Note (Signed)
Too small to need I&D, will likely drain with warm compresses and manipulation at home.  D/w pt.  F/u prn.  I can drain if larger, red, painful.  He agrees.

## 2015-03-22 ENCOUNTER — Other Ambulatory Visit: Payer: Self-pay | Admitting: Family Medicine

## 2015-05-17 ENCOUNTER — Telehealth: Payer: Self-pay | Admitting: Family Medicine

## 2015-05-18 NOTE — Telephone Encounter (Signed)
Received refill request electronically from pharmacy Last office visit 02/12/15/acute Last refill note was sent to patient to scheduled physical for further refills. Is it okay to refill?

## 2015-05-19 NOTE — Telephone Encounter (Signed)
Sent.  Schedule cpe. Thanks.

## 2015-05-19 NOTE — Telephone Encounter (Signed)
Please schedule CPE, per Dr. Damita Dunnings. Thanks!

## 2015-06-14 ENCOUNTER — Other Ambulatory Visit: Payer: Self-pay | Admitting: Family Medicine

## 2015-06-15 NOTE — Telephone Encounter (Signed)
Please schedule CPE as instructed per Dr. Damita Dunnings.

## 2015-06-15 NOTE — Telephone Encounter (Signed)
Lab appointment 1/5 cpx 1/12

## 2015-07-31 ENCOUNTER — Encounter (HOSPITAL_COMMUNITY): Payer: Self-pay | Admitting: Emergency Medicine

## 2015-07-31 ENCOUNTER — Telehealth: Payer: Self-pay | Admitting: Family Medicine

## 2015-07-31 ENCOUNTER — Emergency Department (HOSPITAL_COMMUNITY)
Admission: EM | Admit: 2015-07-31 | Discharge: 2015-07-31 | Disposition: A | Discharge status: Home / Self Care | Payer: Medicare Other | Attending: Emergency Medicine | Admitting: Emergency Medicine

## 2015-07-31 DIAGNOSIS — Z79899 Other long term (current) drug therapy: Secondary | ICD-10-CM | POA: Diagnosis not present

## 2015-07-31 DIAGNOSIS — H5711 Ocular pain, right eye: Secondary | ICD-10-CM | POA: Diagnosis present

## 2015-07-31 DIAGNOSIS — I10 Essential (primary) hypertension: Secondary | ICD-10-CM | POA: Insufficient documentation

## 2015-07-31 DIAGNOSIS — H53141 Visual discomfort, right eye: Secondary | ICD-10-CM | POA: Insufficient documentation

## 2015-07-31 DIAGNOSIS — Z8546 Personal history of malignant neoplasm of prostate: Secondary | ICD-10-CM | POA: Diagnosis not present

## 2015-07-31 DIAGNOSIS — Z86010 Personal history of colonic polyps: Secondary | ICD-10-CM | POA: Diagnosis not present

## 2015-07-31 DIAGNOSIS — N529 Male erectile dysfunction, unspecified: Secondary | ICD-10-CM | POA: Insufficient documentation

## 2015-07-31 DIAGNOSIS — K219 Gastro-esophageal reflux disease without esophagitis: Secondary | ICD-10-CM | POA: Insufficient documentation

## 2015-07-31 MED ORDER — TETRACAINE HCL 0.5 % OP SOLN
2.0000 [drp] | Freq: Once | OPHTHALMIC | Status: AC
  Administered 2015-07-31: 2 [drp] via OPHTHALMIC
  Filled 2015-07-31: qty 2

## 2015-07-31 MED ORDER — ERYTHROMYCIN 5 MG/GM OP OINT
1.0000 "application " | TOPICAL_OINTMENT | Freq: Three times a day (TID) | OPHTHALMIC | Status: DC
  Administered 2015-07-31: 1 via OPHTHALMIC
  Filled 2015-07-31: qty 3.5

## 2015-07-31 MED ORDER — FLUORESCEIN SODIUM 1 MG OP STRP
1.0000 | ORAL_STRIP | Freq: Once | OPHTHALMIC | Status: AC
  Administered 2015-07-31: 1 via OPHTHALMIC
  Filled 2015-07-31: qty 1

## 2015-07-31 NOTE — ED Notes (Signed)
Possible metal to right eye.

## 2015-07-31 NOTE — Telephone Encounter (Signed)
Add on here if needed. Thanks.

## 2015-07-31 NOTE — Discharge Instructions (Signed)
Pain Without a Known Cause °WHAT IS PAIN WITHOUT A KNOWN CAUSE? °Pain can occur in any part of the body and can range from mild to severe. Sometimes no cause can be found for why you are having pain. Some types of pain that can occur without a known cause include:  °· Headache. °· Back pain. °· Abdominal pain. °· Neck pain. °HOW IS PAIN WITHOUT A KNOWN CAUSE DIAGNOSED?  °Your health care provider will try to find the cause of your pain. This may include: °· Physical exam. °· Medical history. °· Blood tests. °· Urine tests. °· X-rays. °If no cause is found, your health care provider may diagnose you with pain without a known cause.  °IS THERE TREATMENT FOR PAIN WITHOUT A CAUSE?  °Treatment depends on the kind of pain you have. Your health care provider may prescribe medicines to help relieve your pain.  °WHAT CAN I DO AT HOME FOR MY PAIN?  °· Take medicines only as directed by your health care provider. °· Stop any activities that cause pain. During periods of severe pain, bed rest may help. °· Try to reduce your stress with activities such as yoga or meditation. Talk to your health care provider for other stress-reducing activity recommendations. °· Exercise regularly, if approved by your health care provider. °· Eat a healthy diet that includes fruits and vegetables. This may improve pain. Talk to your health care provider if you have any questions about your diet. °WHAT IF MY PAIN DOES NOT GET BETTER?  °If you have a painful condition and no reason can be found for the pain or the pain gets worse, it is important to follow up with your health care provider. It may be necessary to repeat tests and look further for a possible cause.  °  °This information is not intended to replace advice given to you by your health care provider. Make sure you discuss any questions you have with your health care provider. °  °Document Released: 04/26/2001 Document Revised: 08/22/2014 Document Reviewed: 12/17/2013 °Elsevier  Interactive Patient Education ©2016 [employer] ° °

## 2015-07-31 NOTE — Telephone Encounter (Signed)
Patient Name: Howard [name] DOB: May 21, 1951 Initial Comment Caller states he was using a grinder yesterday, now having eye pain. Was wearing safety glasses when it happened. Feels like there is something in eye. Nurse Assessment Nurse: Ronnald Ramp, RN, Miranda Date/Time (Eastern Time): 07/31/2015 8:42:17 AM Confirm and document reason for call. If symptomatic, describe symptoms. ---Caller states he was using a grinder yesterday and he thinks something is in his right eye. He is having pain and watering from the eye. Has the patient traveled out of the country within the last 30 days? ---Not Applicable Does the patient have any new or worsening symptoms? ---Yes Will a triage be completed? ---Yes Related visit to physician within the last 2 weeks? ---No Does the PT have any chronic conditions? (i.e. diabetes, asthma, etc.) ---Yes List chronic conditions. ---HTN Is this a behavioral health or substance abuse call? ---No Guidelines Guideline Title Affirmed Question Affirmed Notes Eye - Foreign Body [1] Sharp FB (even if FB was removed) AND [2] any pain present now Final Disposition User Go to ED Now Ronnald Ramp, RN, [facility] - ED Disagree/Comply: Comply

## 2015-07-31 NOTE — Telephone Encounter (Signed)
Pt is not in a lot of pain but his sister is in the hospital and he has other obligations today; pt did not want to schedule appt at [facility] today; pt said going to ED now.

## 2015-07-31 NOTE — ED Provider Notes (Signed)
CSN: [id number]     Arrival date & time 07/31/15  1026 History  By signing my name below, I, Essence Howell, attest that this documentation has been prepared under the direction and in the presence of Glendell Docker, NP Electronically Signed: Ladene Artist, ED Scribe 07/31/2015 at 10:52 AM.   Chief Complaint  Patient presents with  . Eye Injury   The history is provided by the patient. No language interpreter was used.   HPI Comments: Howard [name] is a 64 y.o. male who presents to the Emergency Department complaining of gradually improving, constant right eye pain onset last night. Pt states that he was grinding metal last night while wearing clear protective goggles. He noticed a "gritty" sensation in his right eye last night as well as associated right eye tearing which has improved. He denies visual disturbances. Pt does not wear corrective lenses.   Past Medical History  Diagnosis Date  . History of CT scan of chest 11/2004    negative  . Hypertension   . ED (erectile dysfunction)   . Colon polyps     on colonoscopy 08/2010  . Anesthesia complication     prolonged sedation after colonoscopy  . Cancer [facility])     prostate  . GERD (gastroesophageal reflux disease)    Past Surgical History  Procedure Laterality Date  . Esophagogastroduodenoscopy  10/02/2000    dilated reflux esopsh hh  . Mch: dehydration; vasvagal sync  12/09/ 05/2003  . Plantar fascia surgery Bilateral   . Robot assisted laparoscopic radical prostatectomy N/A 05/27/2013    Procedure: ROBOTIC ASSISTED LAPAROSCOPIC RADICAL PROSTATECTOMY LEVEL 1;  Surgeon: Dutch Gray, MD;  Location: [facility] ORS;  Service: Urology;  Laterality: N/A;   Family History  Problem Relation Age of Onset  . Prostate cancer Neg Hx   . Colon cancer Neg Hx   . Stroke Mother    Social History  Substance Use Topics  . Smoking status: Never Smoker   . Smokeless tobacco: Never Used  . Alcohol Use: No     Comment: no    Review of  Systems  Eyes: Positive for pain and discharge. Negative for visual disturbance.   Allergies  Ibuprofen  Home Medications   Prior to Admission medications   Medication Sig Start Date End Date Taking? Authorizing Provider  candesartan (ATACAND) 16 MG tablet TAKE ONE-HALF TABLET BY MOUTH DAILY 06/15/15   Tonia Ghent, MD  Omeprazole-Sodium Bicarbonate (ZEGERID OTC) 20-1100 MG CAPS Take 1 capsule by mouth daily before breakfast.    Historical Provider, MD  tadalafil (CIALIS) 5 MG tablet Take 1 tablet (5 mg total) by mouth daily. 02/12/15   Tonia Ghent, MD   BP 129/95 mmHg  Pulse 79  Temp(Src) 98.4 F (36.9 C) (Oral)  Resp 16  Ht 5\' 9"  (1.753 m)  Wt 198 lb (89.812 kg)  BMI 29.23 kg/m2  SpO2 98% Physical Exam  Constitutional: He is oriented to person, place, and time. He appears well-developed and well-nourished. No distress.  HENT:  Head: Normocephalic and atraumatic.  Eyes: Conjunctivae and EOM are normal. Pupils are equal, round, and reactive to light.  Slit lamp exam:      The right eye shows no fluorescein uptake.  No redness.   Neck: Neck supple. No tracheal deviation present.  Cardiovascular: Normal rate.   Pulmonary/Chest: Effort normal. No respiratory distress.  Musculoskeletal: Normal range of motion.  Neurological: He is alert and oriented to person, place, and time.  Skin: Skin is  warm and dry.  Psychiatric: He has a normal mood and affect. His behavior is normal.  Nursing note and vitals reviewed.  ED Course  Procedures (including critical care time) DIAGNOSTIC STUDIES: Oxygen Saturation is 98% on RA, normal by my interpretation.    COORDINATION OF CARE: 10:33 AM-Discussed treatment plan which includes fluorescein eye stain with pt at bedside and pt agreed to plan.   Labs Review Labs Reviewed - No data to display  Imaging Review No results found. I have personally reviewed and evaluated these images and lab results as part of my medical  decision-making.   EKG Interpretation None      MDM   Final diagnoses:  Eye discomfort, right    No fb noted. Think pt likely rinsed yesterday. Given erythromycin for inflammation. Given strict precautions and ortho follow up  I personally performed the services described in this documentation, which was scribed in my presence. The recorded information has been reviewed and is accurate.    Glendell Docker, NP 07/31/15 1055  Julianne Rice, MD 07/31/15 1239

## 2015-07-31 NOTE — Telephone Encounter (Signed)
Noted. Thanks.

## 2015-08-17 ENCOUNTER — Other Ambulatory Visit: Payer: Self-pay | Admitting: Family Medicine

## 2015-08-17 DIAGNOSIS — I10 Essential (primary) hypertension: Secondary | ICD-10-CM

## 2015-08-20 ENCOUNTER — Other Ambulatory Visit (INDEPENDENT_AMBULATORY_CARE_PROVIDER_SITE_OTHER): Payer: Medicare Other

## 2015-08-20 DIAGNOSIS — I10 Essential (primary) hypertension: Secondary | ICD-10-CM | POA: Diagnosis not present

## 2015-08-20 LAB — COMPREHENSIVE METABOLIC PANEL
ALBUMIN: 4 g/dL (ref 3.5–5.2)
ALT: 21 U/L (ref 0–53)
AST: 25 U/L (ref 0–37)
Alkaline Phosphatase: 97 U/L (ref 39–117)
BUN: 25 mg/dL — AB (ref 6–23)
CHLORIDE: 102 meq/L (ref 96–112)
CO2: 28 meq/L (ref 19–32)
Calcium: 9.9 mg/dL (ref 8.4–10.5)
Creatinine, Ser: 1.32 mg/dL (ref 0.40–1.50)
GFR: 57.74 mL/min — ABNORMAL LOW (ref >60.00)
GLUCOSE: 105 mg/dL — AB (ref 70–99)
POTASSIUM: 4.5 meq/L (ref 3.5–5.1)
SODIUM: 136 meq/L (ref 135–145)
Total Bilirubin: 0.4 mg/dL (ref 0.2–1.2)
Total Protein: 7.2 g/dL (ref 6.0–8.3)

## 2015-08-20 LAB — LIPID PANEL
CHOL/HDL RATIO: 6
CHOLESTEROL: 236 mg/dL — AB (ref 0–200)
HDL: 38.9 mg/dL — ABNORMAL LOW (ref >39.00)
LDL CALC: 164 mg/dL — AB (ref 0–99)
NonHDL: 197.27
Triglycerides: 165 mg/dL — ABNORMAL HIGH (ref 0.0–149.0)
VLDL: 33 mg/dL (ref 0.0–40.0)

## 2015-08-27 ENCOUNTER — Encounter: Payer: Self-pay | Admitting: Family Medicine

## 2015-08-27 ENCOUNTER — Other Ambulatory Visit: Payer: Self-pay | Admitting: Family Medicine

## 2015-08-27 ENCOUNTER — Ambulatory Visit (INDEPENDENT_AMBULATORY_CARE_PROVIDER_SITE_OTHER): Payer: Medicare Other | Admitting: Family Medicine

## 2015-08-27 VITALS — BP 116/78 | HR 68 | Temp 98.0°F | Ht 69.0 in | Wt 206.5 lb

## 2015-08-27 DIAGNOSIS — Z0000 Encounter for general adult medical examination without abnormal findings: Secondary | ICD-10-CM

## 2015-08-27 DIAGNOSIS — Z7189 Other specified counseling: Secondary | ICD-10-CM | POA: Diagnosis not present

## 2015-08-27 DIAGNOSIS — Z119 Encounter for screening for infectious and parasitic diseases, unspecified: Secondary | ICD-10-CM

## 2015-08-27 DIAGNOSIS — Z23 Encounter for immunization: Secondary | ICD-10-CM

## 2015-08-27 DIAGNOSIS — I10 Essential (primary) hypertension: Secondary | ICD-10-CM

## 2015-08-27 MED ORDER — CANDESARTAN CILEXETIL 16 MG PO TABS: 8.0000 mg | ORAL_TABLET | Freq: Every day | ORAL | Status: DC

## 2015-08-27 NOTE — Addendum Note (Signed)
Addended by: Ellamae Sia on: 08/27/2015 02:19 PM   Modules accepted: Orders

## 2015-08-27 NOTE — Assessment & Plan Note (Signed)
Flu declined Shingles prev done PNA 2016 Tetanus 2009 Colon 2012 PSA per uro Advance directive- wife designated if patient were incapacitated.   Cognitive function addressed- see scanned forms- and if abnormal then additional documentation follows.  Pt opts in for HCV screening.  D/w pt re: routine screening.   HIV prev neg per patient ~2007.   Baseline EKG prev done.

## 2015-08-27 NOTE — Progress Notes (Signed)
Pre visit review using our clinic review tool, if applicable. No additional management support is needed unless otherwise documented below in the visit note.  I have personally reviewed the Medicare Annual Wellness questionnaire and have noted 1. The patient's medical and social history 2. Their use of alcohol, tobacco or illicit drugs 3. Their current medications and supplements 4. The patient's functional ability including ADL's, fall risks, home safety risks and hearing or visual             impairment. 5. Diet and physical activities 6. Evidence for depression or mood disorders  The patients weight, height, BMI have been recorded in the chart and visual acuity is per eye clinic.  I have made referrals, counseling and provided education to the patient based review of the above and I have provided the pt with a written personalized care plan for preventive services.  Provider list updated- see scanned forms.  Routine anticipatory guidance given to patient.  See health maintenance.  Flu declined Shingles prev done PNA 2016 Tetanus 2009 Colon 2012 PSA per uro Advance directive- wife designated if patient were incapacitated.   Cognitive function addressed- see scanned forms- and if abnormal then additional documentation follows.  Pt opts in for HCV screening.  D/w pt re: routine screening.   HIV prev neg per patient ~2007.   Baseline EKG prev done.   Hypertension:    Using medication without problems or lightheadedness: yes Chest pain with exertion:no Edema:no Short of breath:no Labs d/w pt.  Diet and exercise d/w pt.  Weight is up.  D/w pt re: sugar.  Statin declined.  Never been on treatment.  Labs d/w pt.    PMH and SH reviewed  Meds, vitals, and allergies reviewed.   ROS: See HPI.  Otherwise negative.    GEN: nad, alert and oriented HEENT: mucous membranes moist NECK: supple w/o LA CV: rrr. PULM: ctab, no inc wob ABD: soft, +bs EXT: no edema SKIN: no acute rash

## 2015-08-27 NOTE — Patient Instructions (Addendum)
If you don't get a call or a note from GI soon, then let me know and we'll get you set up.   Diet and exercise- that will help your sugar, weight and cholesterol.   Take care.  Glad to see you.  Don't change your BP medicine.

## 2015-08-27 NOTE — Assessment & Plan Note (Signed)
Reasonable control, d/w pt about diet and exercise.  Can check Cr periodically.  Near baseline.   Continue meds as is.  Labs d/w pt.  Needs work on weight for sugar and LDL.  Declined statin tx.

## 2015-08-28 LAB — HEPATITIS C ANTIBODY: HCV AB: NEGATIVE

## 2015-09-07 ENCOUNTER — Encounter: Payer: Self-pay | Admitting: Gastroenterology

## 2015-09-26 ENCOUNTER — Emergency Department (HOSPITAL_COMMUNITY)
Admission: EM | Admit: 2015-09-26 | Discharge: 2015-09-26 | Disposition: A | Discharge status: Home / Self Care | Payer: Medicare Other | Attending: Emergency Medicine | Admitting: Emergency Medicine

## 2015-09-26 ENCOUNTER — Encounter (HOSPITAL_COMMUNITY): Payer: Self-pay

## 2015-09-26 DIAGNOSIS — Z86010 Personal history of colonic polyps: Secondary | ICD-10-CM | POA: Insufficient documentation

## 2015-09-26 DIAGNOSIS — Z8546 Personal history of malignant neoplasm of prostate: Secondary | ICD-10-CM | POA: Insufficient documentation

## 2015-09-26 DIAGNOSIS — K219 Gastro-esophageal reflux disease without esophagitis: Secondary | ICD-10-CM | POA: Insufficient documentation

## 2015-09-26 DIAGNOSIS — R61 Generalized hyperhidrosis: Secondary | ICD-10-CM | POA: Insufficient documentation

## 2015-09-26 DIAGNOSIS — I10 Essential (primary) hypertension: Secondary | ICD-10-CM | POA: Insufficient documentation

## 2015-09-26 DIAGNOSIS — Z87438 Personal history of other diseases of male genital organs: Secondary | ICD-10-CM | POA: Insufficient documentation

## 2015-09-26 DIAGNOSIS — Z79899 Other long term (current) drug therapy: Secondary | ICD-10-CM | POA: Insufficient documentation

## 2015-09-26 DIAGNOSIS — R42 Dizziness and giddiness: Secondary | ICD-10-CM | POA: Diagnosis present

## 2015-09-26 DIAGNOSIS — R55 Syncope and collapse: Secondary | ICD-10-CM | POA: Diagnosis not present

## 2015-09-26 LAB — CBG MONITORING, ED: Glucose-Capillary: 104 mg/dL — ABNORMAL HIGH (ref 65–99)

## 2015-09-26 LAB — CBC WITH DIFFERENTIAL/PLATELET
Basophils Absolute: 0 10*3/uL (ref 0.0–0.1)
Basophils Relative: 0 %
Eosinophils Absolute: 0.2 10*3/uL (ref 0.0–0.7)
Eosinophils Relative: 1 %
HEMATOCRIT: 43.5 % (ref 39.0–52.0)
HEMOGLOBIN: 14.3 g/dL (ref 13.0–17.0)
LYMPHS ABS: 1.8 10*3/uL (ref 0.7–4.0)
Lymphocytes Relative: 16 %
MCH: 28.6 pg (ref 26.0–34.0)
MCHC: 32.9 g/dL (ref 30.0–36.0)
MCV: 87 fL (ref 78.0–100.0)
MONOS PCT: 12 %
Monocytes Absolute: 1.3 10*3/uL — ABNORMAL HIGH (ref 0.1–1.0)
NEUTROS ABS: 8.1 10*3/uL — AB (ref 1.7–7.7)
NEUTROS PCT: 71 %
Platelets: 274 10*3/uL (ref 150–400)
RBC: 5 MIL/uL (ref 4.22–5.81)
RDW: 13.3 % (ref 11.5–15.5)
WBC: 11.3 10*3/uL — ABNORMAL HIGH (ref 4.0–10.5)

## 2015-09-26 LAB — BASIC METABOLIC PANEL
Anion gap: 11 (ref 5–15)
BUN: 21 mg/dL — AB (ref 6–20)
CHLORIDE: 105 mmol/L (ref 101–111)
CO2: 23 mmol/L (ref 22–32)
CREATININE: 1.34 mg/dL — AB (ref 0.61–1.24)
Calcium: 9.6 mg/dL (ref 8.9–10.3)
GFR calc non Af Amer: 54 mL/min — ABNORMAL LOW (ref >60)
Glucose, Bld: 105 mg/dL — ABNORMAL HIGH (ref 65–99)
Potassium: 4 mmol/L (ref 3.5–5.1)
Sodium: 139 mmol/L (ref 135–145)

## 2015-09-26 MED ORDER — SODIUM CHLORIDE 0.9 % IV BOLUS (SEPSIS)
1000.0000 mL | Freq: Once | INTRAVENOUS | Status: AC
  Administered 2015-09-26: 1000 mL via INTRAVENOUS

## 2015-09-26 NOTE — ED Notes (Signed)
Per EMS, pt was working in the yard.  Pt came in and ate a little.  Pt went to have bm.  While in bathroom, felt dizzy and light headed.  Diaphoretic.  Never had this happen before.  Slight headache.   Vitals:100/76, hr 78, resp 16, 94% ra, cbg 99% ra.  IV 20g left hand.

## 2015-09-26 NOTE — Discharge Instructions (Signed)

## 2015-09-26 NOTE — ED Provider Notes (Signed)
CSN: [id number]     Arrival date & time 09/26/15  1616 History   First MD Initiated Contact with Patient 09/26/15 1640     Chief Complaint  Patient presents with  . Dizziness     (Consider location/radiation/quality/duration/timing/severity/associated sxs/prior Treatment) Patient is a 65 y.o. male presenting with near-syncope. The history is provided by the patient.  Near Syncope This is a new problem. The current episode started 1 to 2 hours ago. The problem occurs rarely. The problem has been resolved. Associated symptoms include headaches (resolved). Pertinent negatives include no chest pain, no abdominal pain and no shortness of breath. Nothing aggravates the symptoms. Nothing relieves the symptoms. He has tried nothing for the symptoms.    Past Medical History  Diagnosis Date  . History of CT scan of chest 11/2004    negative  . Hypertension   . ED (erectile dysfunction)   . Colon polyps     on colonoscopy 08/2010  . Anesthesia complication     prolonged sedation after colonoscopy  . Cancer [facility])     prostate  . GERD (gastroesophageal reflux disease)    Past Surgical History  Procedure Laterality Date  . Esophagogastroduodenoscopy  10/02/2000    dilated reflux esopsh hh  . Mch: dehydration; vasvagal sync  12/09/ 05/2003  . Plantar fascia surgery Bilateral   . Robot assisted laparoscopic radical prostatectomy N/A 05/27/2013    Procedure: ROBOTIC ASSISTED LAPAROSCOPIC RADICAL PROSTATECTOMY LEVEL 1;  Surgeon: Dutch Gray, MD;  Location: [facility]S;  Service: Urology;  Laterality: N/A;   Family History  Problem Relation Age of Onset  . Prostate cancer Neg Hx   . Colon cancer Neg Hx   . Stroke Mother    Social History  Substance Use Topics  . Smoking status: Never Smoker   . Smokeless tobacco: Never Used  . Alcohol Use: No     Comment: no    Review of Systems  Respiratory: Negative for shortness of breath.   Cardiovascular: Positive for near-syncope. Negative for chest  pain.  Gastrointestinal: Negative for abdominal pain.  Neurological: Positive for headaches (resolved).  All other systems reviewed and are negative.     Allergies  Ibuprofen  Home Medications   Prior to Admission medications   Medication Sig Start Date End Date Taking? Authorizing Provider  candesartan (ATACAND) 16 MG tablet Take 0.5 tablets (8 mg total) by mouth daily. 08/27/15  Yes Tonia Ghent, MD  Omeprazole-Sodium Bicarbonate (ZEGERID OTC) 20-1100 MG CAPS Take 1 capsule by mouth daily before breakfast.   Yes Historical Provider, MD  tadalafil (CIALIS) 5 MG tablet Take 5 mg by mouth daily as needed for erectile dysfunction. Reported on 08/27/2015    Historical Provider, MD   BP 103/80 mmHg  Pulse 80  Temp(Src) 97.8 F (36.6 C) (Oral)  Resp 16  SpO2 94% Physical Exam  Constitutional: He is oriented to person, place, and time. He appears well-developed and well-nourished. No distress.  HENT:  Head: Normocephalic and atraumatic.  Eyes: Conjunctivae are normal.  Neck: Neck supple. No tracheal deviation present.  Cardiovascular: Normal rate, regular rhythm and normal heart sounds.  Exam reveals no gallop and no friction rub.   No murmur heard. Pulmonary/Chest: Effort normal and breath sounds normal. No respiratory distress. He has no wheezes. He has no rales.  Abdominal: Soft. He exhibits no distension. There is no tenderness.  Neurological: He is alert and oriented to person, place, and time.  Skin: Skin is warm and dry.  Psychiatric:  He has a normal mood and affect.  Vitals reviewed.   ED Course  Procedures (including critical care time) Labs Review Labs Reviewed  CBC WITH DIFFERENTIAL/PLATELET - Abnormal; Notable for the following:    WBC 11.3 (*)    Neutro Abs 8.1 (*)    Monocytes Absolute 1.3 (*)    All other components within normal limits  BASIC METABOLIC PANEL - Abnormal; Notable for the following:    Glucose, Bld 105 (*)    BUN 21 (*)    Creatinine, Ser  1.34 (*)    GFR calc non Af Amer 54 (*)    All other components within normal limits  CBG MONITORING, ED - Abnormal; Notable for the following:    Glucose-Capillary 104 (*)    All other components within normal limits    Imaging Review No results found. I have personally reviewed and evaluated these images and lab results as part of my medical decision-making.   EKG Interpretation None      MDM   Final diagnoses:  Vasovagal near-syncope    65 y.o. male presents with  Dizziness episode with sweating and lightheadedness that occurred after doing yard work and coming inside his house and then having a bowel movement. Resolved spontaneously. Currently asymptomatic. No significant hematologic or metabolic abnormalities to explain symptoms. Symptoms clinically c/w mild dehydration treated with IVF and subsequent vagal episode with no palpitations, persistent headache, or other concerning symptoms currently. Plan to follow up with PCP as needed and return precautions discussed for worsening or new concerning symptoms.     Leo Grosser, MD 09/27/15 [phone]

## 2016-02-24 ENCOUNTER — Encounter: Payer: Self-pay | Admitting: Family Medicine

## 2016-02-24 ENCOUNTER — Ambulatory Visit (INDEPENDENT_AMBULATORY_CARE_PROVIDER_SITE_OTHER): Payer: Medicare Other | Admitting: Family Medicine

## 2016-02-24 VITALS — BP 114/78 | HR 62 | Temp 98.9°F | Ht 69.0 in | Wt 204.2 lb

## 2016-02-24 DIAGNOSIS — R21 Rash and other nonspecific skin eruption: Secondary | ICD-10-CM

## 2016-02-24 MED ORDER — CLOTRIMAZOLE-BETAMETHASONE 1-0.05 % EX CREA: 1.0000 "application " | TOPICAL_CREAM | Freq: Two times a day (BID) | CUTANEOUS | Status: DC

## 2016-02-24 NOTE — Progress Notes (Signed)
Pre visit review using our clinic review tool, if applicable. No additional management support is needed unless otherwise documented below in the visit note.  For the last 5-6 days with a itchy gluteal crease rash.  Using baby powder.  No FCNAVD.  Feels well o/w.    Meds, vitals, and allergies reviewed.   ROS: Per HPI unless specifically indicated in ROS section   nad B gluteal rash w/o ulceration or mass

## 2016-02-24 NOTE — Patient Instructions (Signed)
Use the cream and this should get better.   Update me as needed.  Take care.  Glad to see you.

## 2016-02-25 DIAGNOSIS — R21 Rash and other nonspecific skin eruption: Secondary | ICD-10-CM | POA: Insufficient documentation

## 2016-02-25 NOTE — Assessment & Plan Note (Signed)
Likely superficial irritation +/- fungal component.  Use lotrisone and fu prn.  He agrees.

## 2016-03-03 ENCOUNTER — Encounter: Payer: Self-pay | Admitting: Family Medicine

## 2016-03-03 ENCOUNTER — Ambulatory Visit (INDEPENDENT_AMBULATORY_CARE_PROVIDER_SITE_OTHER): Payer: Medicare Other | Admitting: Family Medicine

## 2016-03-03 VITALS — BP 98/72 | HR 72 | Temp 97.8°F | Wt 203.0 lb

## 2016-03-03 DIAGNOSIS — H6121 Impacted cerumen, right ear: Secondary | ICD-10-CM | POA: Diagnosis not present

## 2016-03-03 DIAGNOSIS — B359 Dermatophytosis, unspecified: Secondary | ICD-10-CM | POA: Diagnosis not present

## 2016-03-03 NOTE — Progress Notes (Signed)
Pre visit review using our clinic review tool, if applicable. No additional management support is needed unless otherwise documented below in the visit note.  Rash improved in the meantime.    He isn't lightheaded, d/w pt about BP today.  He can check BP at home and update me as needed.    R ear itching and plugged feeling.  Gets stopped up in the shower, more than typical.  No pain.  No FCNAV.   Penile rash.  Noted a few days ago.  At proximal glans.  Irritated but not painful.  No ulceration.  No testicle pain.  No burning with urination.  Meds, vitals, and allergies reviewed.   ROS: Per HPI unless specifically indicated in ROS section   nad R cerumen impaction resolved with irrigation and curette.   No complications.  Recheck TM wnl, canal wnl.   Fungal infection on the proximal glans.  No ulceration.

## 2016-03-03 NOTE — Patient Instructions (Signed)
Use warm water to irrigate your ears in the shower.  Get OTC clotrimazole and use the twice a day on the rash.  Take care.  Glad to see you.  Update me as needed.

## 2016-03-04 DIAGNOSIS — H6120 Impacted cerumen, unspecified ear: Secondary | ICD-10-CM | POA: Insufficient documentation

## 2016-03-04 DIAGNOSIS — B359 Dermatophytosis, unspecified: Secondary | ICD-10-CM | POA: Insufficient documentation

## 2016-03-04 NOTE — Assessment & Plan Note (Signed)
Likely superficial fungal infection, d/w pt about topical antifungal cream.  See AVS. Can use talc.  F/u prn.

## 2016-03-04 NOTE — Assessment & Plan Note (Signed)
R cerumen impaction resolved with irrigation and curette.   No complications.

## 2016-04-20 ENCOUNTER — Encounter: Payer: Self-pay | Admitting: Family Medicine

## 2016-04-20 ENCOUNTER — Ambulatory Visit (INDEPENDENT_AMBULATORY_CARE_PROVIDER_SITE_OTHER): Payer: Medicare Other | Admitting: Family Medicine

## 2016-04-20 VITALS — BP 118/60 | HR 74 | Temp 98.7°F | Wt 206.0 lb

## 2016-04-20 DIAGNOSIS — H6120 Impacted cerumen, unspecified ear: Secondary | ICD-10-CM

## 2016-04-20 DIAGNOSIS — Z23 Encounter for immunization: Secondary | ICD-10-CM

## 2016-04-20 DIAGNOSIS — M545 Low back pain, unspecified: Secondary | ICD-10-CM

## 2016-04-20 DIAGNOSIS — H8110 Benign paroxysmal vertigo, unspecified ear: Secondary | ICD-10-CM

## 2016-04-20 MED ORDER — CYCLOBENZAPRINE HCL 10 MG PO TABS: 5.0000 mg | ORAL_TABLET | Freq: Three times a day (TID) | ORAL | 0 refills | Status: DC | PRN

## 2016-04-20 NOTE — Patient Instructions (Signed)
Likely BPV, now resolved.  Use the bed side exercise if you have more dizzy episodes.  Update me as needed.  Try ice and/or heat for your back.  Use flexeril in the meantime.  Sedation caution.  Take care.  Glad to see you.

## 2016-04-20 NOTE — Progress Notes (Signed)
Pre visit review using our clinic review tool, if applicable. No additional management support is needed unless otherwise documented below in the visit note. 

## 2016-04-20 NOTE — Progress Notes (Signed)
BP had been elevated recently 140s/80s with a skipped dose of med.  Lower, controlled, after taking his medicine.    He had several days with sx as he would lay down, sit down, or stand up. It was a mild sensation of motion, not lightheaded.  Better/resolved now.  Not lightheaded.    Back pain.  After lifting a concrete slab.  No pain at the time, but noted a little later.  Pain in the B lower back. No leg sx.  No B/B sx.  No FCNAVD.  He doesn't feel unwell o/w.    Meds, vitals, and allergies reviewed.   ROS: Per HPI unless specifically indicated in ROS section   GEN: nad, alert and oriented HEENT: mucous membranes moist NECK: supple w/o LA CV: rrr.  PULM: ctab, no inc wob ABD: soft, +bs EXT: no edema SKIN: no acute rash Back minimally ttp in the B L spine paraspinal muscles, just lateral to spine.  Not ttp in midline.  CN 2-12 wnl B, S/S/DTR wnl x4 DHP neg L ear with wax easily removed with irrigation and recheck wnl.  No complications.

## 2016-04-21 DIAGNOSIS — H8110 Benign paroxysmal vertigo, unspecified ear: Secondary | ICD-10-CM | POA: Insufficient documentation

## 2016-04-21 NOTE — Assessment & Plan Note (Signed)
Resolved, f/u prn.  

## 2016-04-21 NOTE — Assessment & Plan Note (Signed)
Likely diagnosis. Resolved now. Discussed with patient. If return of symptoms, can use bedside exercises. Discussed with patient. He understood.

## 2016-04-21 NOTE — Assessment & Plan Note (Signed)
Likely benign strain, discussed with patient. No imaging needed. Strength and sensation within normal limits for the legs. Follow-up when necessary. Can use Flexeril meantime.

## 2016-08-05 ENCOUNTER — Ambulatory Visit (INDEPENDENT_AMBULATORY_CARE_PROVIDER_SITE_OTHER): Payer: Medicare Other | Admitting: Primary Care

## 2016-08-05 VITALS — BP 124/80 | HR 63 | Temp 98.3°F | Ht 69.0 in | Wt 199.4 lb

## 2016-08-05 DIAGNOSIS — H938X3 Other specified disorders of ear, bilateral: Secondary | ICD-10-CM | POA: Diagnosis not present

## 2016-08-05 NOTE — Progress Notes (Signed)
Subjective:    Patient ID: Howard [name], male    DOB: 14-Nov-1950, 65 y.o.   MRN: [id number]  HPI  Howard [name] is a 65 year old male who presents today with a chief complaint of ear fullness. His fullness is located to the bilateral ears that has been present for the past 1 week. He has a history of cerumen impactions which have required irrigation. He denies pain, fevers, sore throat, cough. He's not used anything OTC for his symptoms.  Review of Systems  Constitutional: Negative for fever.  HENT: Negative for congestion, ear pain, postnasal drip and sore throat.        Ear fullness bilaterally  Respiratory: Negative for cough.   Cardiovascular: Negative for chest pain.  Neurological: Negative for dizziness.       Past Medical History:  Diagnosis Date  . Anesthesia complication    prolonged sedation after colonoscopy  . Cancer [facility])    prostate  . Colon polyps    on colonoscopy 08/2010  . ED (erectile dysfunction)   . GERD (gastroesophageal reflux disease)   . History of CT scan of chest 11/2004   negative  . Hypertension      Social History   Social History  . Marital status: Married    Spouse name: N/A  . Number of children: N/A  . Years of education: N/A   Occupational History  . Not on file.   Social History Main Topics  . Smoking status: Never Smoker  . Smokeless tobacco: Never Used  . Alcohol use No     Comment: no  . Drug use: No  . Sexual activity: Not on file   Other Topics Concern  . Not on file   Social History Narrative   Retired Advertising copywriter, now [employer]   Married 1976, lives with wife. 1 step daughter, 2 daughters and 1 son.   [place] [employer] fan    Past Surgical History:  Procedure Laterality Date  . ESOPHAGOGASTRODUODENOSCOPY  10/02/2000   dilated reflux esopsh hh  . [facility]: Dehydration; vasvagal sync  12/09/ 05/2003  . PLANTAR FASCIA SURGERY Bilateral   . ROBOT ASSISTED LAPAROSCOPIC RADICAL PROSTATECTOMY N/A  05/27/2013   Procedure: ROBOTIC ASSISTED LAPAROSCOPIC RADICAL PROSTATECTOMY LEVEL 1;  Surgeon: Dutch Gray, MD;  Location: [facility];  Service: Urology;  Laterality: N/A;    Family History  Problem Relation Age of Onset  . Stroke Mother   . Prostate cancer Neg Hx   . Colon cancer Neg Hx     Allergies  Allergen Reactions  . Ibuprofen     "Eats a hole in the lining of his stomach"    Current Outpatient Prescriptions on File Prior to Visit  Medication Sig Dispense Refill  . candesartan (ATACAND) 16 MG tablet Take 0.5 tablets (8 mg total) by mouth daily. 15 tablet 12  . Omeprazole-Sodium Bicarbonate (ZEGERID OTC) 20-1100 MG CAPS Take 1 capsule by mouth daily before breakfast.     No current facility-administered medications on file prior to visit.     BP 124/80   Pulse 63   Temp 98.3 F (36.8 C) (Oral)   Ht 5\' 9"  (1.753 m)   Wt 199 lb 6.4 oz (90.4 kg)   SpO2 96%   BMI 29.45 kg/m    Objective:   Physical Exam  Constitutional: He appears well-nourished.  HENT:  Right Ear: Ear canal normal. Tympanic membrane is retracted.  Left Ear: Ear canal normal. Tympanic membrane is retracted.  Nose: No mucosal edema. Right sinus exhibits no maxillary sinus tenderness and no frontal sinus tenderness. Left sinus exhibits no maxillary sinus tenderness and no frontal sinus tenderness.  Mouth/Throat: Oropharynx is clear and moist.  Eyes: Conjunctivae are normal.  Neck: Neck supple.  Cardiovascular: Normal rate and regular rhythm.   Pulmonary/Chest: Effort normal and breath sounds normal. He has no wheezes. He has no rales.  Skin: Skin is warm and dry.          Assessment & Plan:  Ear Fullness:  Located bilaterally x 1 week. History of cerumen impactions. Has not used anything OTC. Exam today without cerumen impaction, infection. TM's retracted without erythema. Discussed use of Flonase for symptoms. Discussed to use Debrox drops for future cerumen buildup. Return precautions  provided.  Sheral Flow, NP

## 2016-08-05 NOTE — Progress Notes (Signed)
Pre visit review using our clinic review tool, if applicable. No additional management support is needed unless otherwise documented below in the visit note. 

## 2016-08-05 NOTE — Patient Instructions (Signed)
Ear Pressure/Fullness: Try using Flonase (fluticasone) nasal spray. Instill 1 spray in each nostril twice daily.   Ear Wax Treatment: Consider purchasing Debrox drops for ear wax removal. Follow the package instructions for use.  Please notify me if you develop fevers, ear pain, sore throat, increased pressure.  It was a pleasure meeting you!

## 2016-08-15 DIAGNOSIS — G4730 Sleep apnea, unspecified: Secondary | ICD-10-CM

## 2016-08-15 HISTORY — DX: Sleep apnea, unspecified: G47.30

## 2016-09-07 ENCOUNTER — Other Ambulatory Visit: Payer: Self-pay | Admitting: Family Medicine

## 2016-10-09 ENCOUNTER — Other Ambulatory Visit: Payer: Self-pay | Admitting: Family Medicine

## 2016-11-10 ENCOUNTER — Encounter: Payer: Self-pay | Admitting: Family Medicine

## 2016-11-10 ENCOUNTER — Ambulatory Visit (INDEPENDENT_AMBULATORY_CARE_PROVIDER_SITE_OTHER): Payer: Medicare Other | Admitting: Family Medicine

## 2016-11-10 VITALS — BP 112/70 | HR 58 | Temp 98.5°F | Wt 203.8 lb

## 2016-11-10 DIAGNOSIS — R0683 Snoring: Secondary | ICD-10-CM

## 2016-11-10 NOTE — Patient Instructions (Signed)
Rosaria Ferries will call about your referral. Take care.  Glad to see you.  Update me as needed.

## 2016-11-10 NOTE — Progress Notes (Signed)
Snoring is getting worse.  Wife noted it worsening.  Occ would wake up gasping for air.  Fatigued.  No AM HA.  No h/o OSA eval.  D/w pt about path/phys.  Weight is stable.  h/o controlled HTN.    L 1st nail with injury about 1 month ago, bruising under the nail.  No need for intervention now.    Meds, vitals, and allergies reviewed.   ROS: Per HPI unless specifically indicated in ROS section   nad ncat Mmm but large tongue noted Neck supple no LA rrr ctab L 1st nail with old bruising noted deep to the nail, with nail still intact, no sign of infection, not ttp

## 2016-11-10 NOTE — Progress Notes (Signed)
Pre visit review using our clinic review tool, if applicable. No additional management support is needed unless otherwise documented below in the visit note. 

## 2016-11-13 DIAGNOSIS — G4733 Obstructive sleep apnea (adult) (pediatric): Secondary | ICD-10-CM

## 2016-11-13 DIAGNOSIS — R0683 Snoring: Secondary | ICD-10-CM | POA: Insufficient documentation

## 2016-11-13 HISTORY — DX: Obstructive sleep apnea (adult) (pediatric): G47.33

## 2016-11-13 NOTE — Assessment & Plan Note (Signed)
Likely chance of OSA, he needs pulm referral for testing and may need oral appliance given the size of his tongue.  Refer.  D/w pt.  He agrees.    The nail changes are incidental and should resolve, no intervention needed now.  D/w pt.

## 2016-11-14 ENCOUNTER — Other Ambulatory Visit: Payer: Self-pay | Admitting: *Deleted

## 2016-11-14 NOTE — Telephone Encounter (Signed)
Received refill request electronically Last office visit 11/10/16/acute Last medicare physical 08/27/15

## 2016-11-15 ENCOUNTER — Telehealth: Payer: Self-pay | Admitting: Family Medicine

## 2016-11-15 MED ORDER — CANDESARTAN CILEXETIL 16 MG PO TABS: 8.0000 mg | ORAL_TABLET | Freq: Every day | ORAL | 1 refills | Status: DC

## 2016-11-15 NOTE — Telephone Encounter (Signed)
Left pt message asking to call Allison back directly at [phone] to schedule AWV.+ labs with Lesia and CPE with PCP. °

## 2016-11-15 NOTE — Telephone Encounter (Signed)
Please schedule patient for AMW visit as instructed. Message sent to [facility].

## 2016-11-15 NOTE — Telephone Encounter (Signed)
Sent.   Due for AMW when possible.  Thanks.

## 2016-11-15 NOTE — Telephone Encounter (Signed)
Scheduled 4/13 an 4/19

## 2016-11-24 ENCOUNTER — Ambulatory Visit: Payer: Medicare Other

## 2016-11-25 ENCOUNTER — Ambulatory Visit (INDEPENDENT_AMBULATORY_CARE_PROVIDER_SITE_OTHER): Payer: Medicare Other

## 2016-11-25 VITALS — BP 118/74 | HR 73 | Temp 98.8°F | Ht 68.0 in | Wt 201.5 lb

## 2016-11-25 DIAGNOSIS — E7849 Other hyperlipidemia: Secondary | ICD-10-CM

## 2016-11-25 DIAGNOSIS — E784 Other hyperlipidemia: Secondary | ICD-10-CM | POA: Diagnosis not present

## 2016-11-25 DIAGNOSIS — Z0000 Encounter for general adult medical examination without abnormal findings: Secondary | ICD-10-CM | POA: Diagnosis not present

## 2016-11-25 DIAGNOSIS — I10 Essential (primary) hypertension: Secondary | ICD-10-CM | POA: Diagnosis not present

## 2016-11-25 LAB — COMPREHENSIVE METABOLIC PANEL
ALBUMIN: 4.3 g/dL (ref 3.5–5.2)
ALK PHOS: 84 U/L (ref 39–117)
ALT: 10 U/L (ref 0–53)
AST: 15 U/L (ref 0–37)
BUN: 22 mg/dL (ref 6–23)
CO2: 29 mEq/L (ref 19–32)
Calcium: 9.9 mg/dL (ref 8.4–10.5)
Chloride: 102 mEq/L (ref 96–112)
Creatinine, Ser: 1.22 mg/dL (ref 0.40–1.50)
GFR: 62.99 mL/min (ref >60.00)
Glucose, Bld: 95 mg/dL (ref 70–99)
POTASSIUM: 4.6 meq/L (ref 3.5–5.1)
Sodium: 136 mEq/L (ref 135–145)
TOTAL PROTEIN: 7.5 g/dL (ref 6.0–8.3)
Total Bilirubin: 0.6 mg/dL (ref 0.2–1.2)

## 2016-11-25 LAB — LIPID PANEL
CHOL/HDL RATIO: 5
Cholesterol: 231 mg/dL — ABNORMAL HIGH (ref 0–200)
HDL: 42.3 mg/dL (ref >39.00)
LDL CALC: 161 mg/dL — AB (ref 0–99)
NONHDL: 188.41
Triglycerides: 136 mg/dL (ref 0.0–149.0)
VLDL: 27.2 mg/dL (ref 0.0–40.0)

## 2016-11-25 LAB — CBC WITH DIFFERENTIAL/PLATELET
Basophils Absolute: 0 10*3/uL (ref 0.0–0.1)
Basophils Relative: 0.5 % (ref 0.0–3.0)
EOS PCT: 1.8 % (ref 0.0–5.0)
Eosinophils Absolute: 0.2 10*3/uL (ref 0.0–0.7)
HEMATOCRIT: 44.1 % (ref 39.0–52.0)
HEMOGLOBIN: 14.8 g/dL (ref 13.0–17.0)
LYMPHS PCT: 31.4 % (ref 12.0–46.0)
Lymphs Abs: 2.9 10*3/uL (ref 0.7–4.0)
MCHC: 33.5 g/dL (ref 30.0–36.0)
MCV: 88.4 fl (ref 78.0–100.0)
Monocytes Absolute: 0.9 10*3/uL (ref 0.1–1.0)
Monocytes Relative: 10.2 % (ref 3.0–12.0)
Neutro Abs: 5.2 10*3/uL (ref 1.4–7.7)
Neutrophils Relative %: 56.1 % (ref 43.0–77.0)
PLATELETS: 286 10*3/uL (ref 150.0–400.0)
RBC: 4.99 Mil/uL (ref 4.22–5.81)
RDW: 13.6 % (ref 11.5–15.5)
WBC: 9.2 10*3/uL (ref 4.0–10.5)

## 2016-11-25 NOTE — Progress Notes (Signed)
Subjective:   Howard [name] is a 66 y.o. male who presents for Medicare Annual/Subsequent preventive examination.  Review of Systems:  N/A Cardiac Risk Factors include: advanced age (>95men, >3 women);male gender;dyslipidemia;hypertension;obesity (BMI >30kg/m2);sedentary lifestyle     Objective:    Vitals: BP 118/74 (BP Location: Right Arm, Patient Position: Sitting, Cuff Size: Normal)   Pulse 73   Temp 98.8 F (37.1 C) (Oral)   Ht 5\' 8"  (1.727 m) Comment: no shoes  Wt 201 lb 8 oz (91.4 kg)   SpO2 94%   BMI 30.64 kg/m   Body mass index is 30.64 kg/m.  Tobacco History  Smoking Status  . Never Smoker  Smokeless Tobacco  . Never Used     Counseling given: No   Past Medical History:  Diagnosis Date  . Anesthesia complication    prolonged sedation after colonoscopy  . Cancer [facility])    prostate  . Colon polyps    on colonoscopy 08/2010  . ED (erectile dysfunction)   . GERD (gastroesophageal reflux disease)   . History of CT scan of chest 11/2004   negative  . Hypertension    Past Surgical History:  Procedure Laterality Date  . ESOPHAGOGASTRODUODENOSCOPY  10/02/2000   dilated reflux esopsh hh  . MCH: Dehydration; vasvagal sync  12/09/ 05/2003  . PLANTAR FASCIA SURGERY Bilateral   . ROBOT ASSISTED LAPAROSCOPIC RADICAL PROSTATECTOMY N/A 05/27/2013   Procedure: ROBOTIC ASSISTED LAPAROSCOPIC RADICAL PROSTATECTOMY LEVEL 1;  Surgeon: Dutch Gray, MD;  Location: [facility];  Service: Urology;  Laterality: N/A;   Family History  Problem Relation Age of Onset  . Stroke Mother   . Prostate cancer Neg Hx   . Colon cancer Neg Hx    History  Sexual Activity  . Sexual activity: Not on file    Outpatient Encounter Prescriptions as of 11/25/2016  Medication Sig  . candesartan (ATACAND) 16 MG tablet Take 0.5 tablets (8 mg total) by mouth daily.  Earney Navy Bicarbonate (ZEGERID OTC) 20-1100 MG CAPS Take 1 capsule by mouth daily before breakfast.   No  facility-administered encounter medications on file as of 11/25/2016.     Activities of Daily Living In your present state of health, do you have any difficulty performing the following activities: 11/25/2016  Hearing? N  Vision? N  Difficulty concentrating or making decisions? N  Walking or climbing stairs? N  Dressing or bathing? N  Doing errands, shopping? N  Preparing Food and eating ? N  Using the Toilet? N  In the past six months, have you accidently leaked urine? N  Do you have problems with loss of bowel control? N  Managing your Medications? N  Managing your Finances? N  Housekeeping or managing your Housekeeping? N  Some recent data might be hidden    Patient Care Team: Tonia Ghent, MD as PCP - General (Family Medicine)   Assessment:     Hearing Screening   125Hz  250Hz  500Hz  1000Hz  2000Hz  3000Hz  4000Hz  6000Hz  8000Hz   Right ear:   40 40 40  40    Left ear:   40 40 40  0    Vision Screening Comments: Last vision exam in 2017 with Dr. Katy Fitch   Exercise Activities and Dietary recommendations Current Exercise Habits: The patient does not participate in regular exercise at present, Exercise limited by: None identified  Goals    . Increase physical activity          Starting 11/25/2016, I will continue to walk at  least 2 miles 3 days per week.       Fall Risk Fall Risk  11/25/2016 08/27/2015  Falls in the past year? No Yes  Number falls in past yr: - 1  Injury with Fall? - Yes   Depression Screen PHQ 2/9 Scores 11/25/2016 08/27/2015  PHQ - 2 Score 0 0    Cognitive Function MMSE - Mini Mental State Exam 11/25/2016  Orientation to time 5  Orientation to Place 5  Registration 3  Attention/ Calculation 0  Recall 3  Language- name 2 objects 0  Language- repeat 1  Language- follow 3 step command 3  Language- read & follow direction 0  Write a sentence 0  Copy design 0  Total score 20     PLEASE NOTE: A Mini-Cog screen was completed. Maximum score is 20. A  value of 0 denotes this part of Folstein MMSE was not completed or the patient failed this part of the Mini-Cog screening.   Mini-Cog Screening Orientation to Time - Max 5 pts Orientation to Place - Max 5 pts Registration - Max 3 pts Recall - Max 3 pts Language Repeat - Max 1 pts Language Follow 3 Step Command - Max 3 pts     Immunization History  Administered Date(s) Administered  . Influenza Split 06/08/2011  . Influenza,inj,Quad PF,36+ Mos 04/20/2016  . Pneumococcal Conjugate-13 08/27/2015  . Td 06/25/2008  . Zoster 07/05/2010   Screening Tests Health Maintenance  Topic Date Due  . PNA vac Low Risk Adult (2 of 2 - PPSV23) 12/01/2016 (Originally 08/26/2016)  . COLONOSCOPY  08/16/2017 (Originally 08/17/2015)  . INFLUENZA VACCINE  03/15/2017  . TETANUS/TDAP  06/25/2018  . Hepatitis C Screening  Completed      Plan:     I have personally reviewed and addressed the Medicare Annual Wellness questionnaire and have noted the following in the patient's chart:  A. Medical and social history B. Use of alcohol, tobacco or illicit drugs  C. Current medications and supplements D. Functional ability and status E.  Nutritional status F.  Physical activity G. Advance directives H. List of other physicians I.  Hospitalizations, surgeries, and ER visits in previous 12 months J.  [place] to include hearing, vision, cognitive, depression L. Referrals and appointments - none  In addition, I have reviewed and discussed with patient certain preventive protocols, quality metrics, and best practice recommendations. A written personalized care plan for preventive services as well as general preventive health recommendations were provided to patient.  See attached scanned questionnaire for additional information.   Signed,   Lindell Noe, MHA, BS, LPN Health Coach

## 2016-11-25 NOTE — Patient Instructions (Signed)
Howard [name] , Thank you for taking time to come for your Medicare Wellness Visit. I appreciate your ongoing commitment to your health goals. Please review the following plan we discussed and let me know if I can assist you in the future.   These are the goals we discussed: Goals    . Increase physical activity          Starting 11/25/2016, I will continue to walk at least 2 miles 3 days per week.        This is a list of the screening recommended for you and due dates:  Health Maintenance  Topic Date Due  . Pneumonia vaccines (2 of 2 - PPSV23) 12/01/2016*  . Colon Cancer Screening  08/16/2017*  . Flu Shot  03/15/2017  . Tetanus Vaccine  06/25/2018  .  Hepatitis C: One time screening is recommended by Center for Disease Control  (CDC) for  adults born from [birth year] through 1966.   Completed  *Topic was postponed. The date shown is not the original due date.   Preventive Care for Adults  A healthy lifestyle and preventive care can promote health and wellness. Preventive health guidelines for adults include the following key practices.  . A routine yearly physical is a good way to check with your health care provider about your health and preventive screening. It is a chance to share any concerns and updates on your health and to receive a thorough exam.  . Visit your dentist for a routine exam and preventive care every 6 months. Brush your teeth twice a day and floss once a day. Good oral hygiene prevents tooth decay and gum disease.  . The frequency of eye exams is based on your age, health, family medical history, use  of contact lenses, and other factors. Follow your health care provider's ecommendations for frequency of eye exams.  . Eat a healthy diet. Foods like vegetables, fruits, whole grains, low-fat dairy products, and lean protein foods contain the nutrients you need without too many calories. Decrease your intake of foods high in solid fats, added sugars, and salt. Eat the right  amount of calories for you. Get information about a proper diet from your health care provider, if necessary.  . Regular physical exercise is one of the most important things you can do for your health. Most adults should get at least 150 minutes of moderate-intensity exercise (any activity that increases your heart rate and causes you to sweat) each week. In addition, most adults need muscle-strengthening exercises on 2 or more days a week.  Silver Sneakers may be a benefit available to you. To determine eligibility, you may visit the website: www.silversneakers.com or contact program at 503-710-9281 Mon-Fri between 8AM-8PM.   . Maintain a healthy weight. The body mass index (BMI) is a screening tool to identify possible weight problems. It provides an estimate of body fat based on height and weight. Your health care provider can find your BMI and can help you achieve or maintain a healthy weight.   For adults 20 years and older: ? A BMI below 18.5 is considered underweight. ? A BMI of 18.5 to 24.9 is normal. ? A BMI of 25 to 29.9 is considered overweight. ? A BMI of 30 and above is considered obese.   . Maintain normal blood lipids and cholesterol levels by exercising and minimizing your intake of saturated fat. Eat a balanced diet with plenty of fruit and vegetables. Blood tests for lipids and cholesterol should begin  at age 15 and be repeated every 5 years. If your lipid or cholesterol levels are high, you are over 50, or you are at high risk for heart disease, you may need your cholesterol levels checked more frequently. Ongoing high lipid and cholesterol levels should be treated with medicines if diet and exercise are not working.  . If you smoke, find out from your health care provider how to quit. If you do not use tobacco, please do not start.  . If you choose to drink alcohol, please do not consume more than 2 drinks per day. One drink is considered to be 12 ounces (355 mL) of beer, 5  ounces (148 mL) of wine, or 1.5 ounces (44 mL) of liquor.  . If you are 25-5 years old, ask your health care provider if you should take aspirin to prevent strokes.  . Use sunscreen. Apply sunscreen liberally and repeatedly throughout the day. You should seek shade when your shadow is shorter than you. Protect yourself by wearing long sleeves, pants, a wide-brimmed hat, and sunglasses year round, whenever you are outdoors.  . Once a month, do a whole body skin exam, using a mirror to look at the skin on your back. Tell your health care provider of new moles, moles that have irregular borders, moles that are larger than a pencil eraser, or moles that have changed in shape or color.

## 2016-11-25 NOTE — Progress Notes (Signed)
Pre visit review using our clinic review tool, if applicable. No additional management support is needed unless otherwise documented below in the visit note. 

## 2016-11-25 NOTE — Progress Notes (Signed)
PCP notes:   Health maintenance:  PPSV23 - pt has requested to take vaccine at CPE  Colon cancer screening- pt will discuss with PCP at next appt  Abnormal screenings:   None  Patient concerns:   None  Nurse concerns:  None  Next PCP appt:   12/01/16 @ 1215  I reviewed health advisor's note, was available for consultation on the day of service listed in this note, and agree with documentation and plan. Elsie Stain, MD.

## 2016-12-01 ENCOUNTER — Ambulatory Visit (INDEPENDENT_AMBULATORY_CARE_PROVIDER_SITE_OTHER): Payer: Medicare Other | Admitting: Family Medicine

## 2016-12-01 ENCOUNTER — Encounter: Payer: Self-pay | Admitting: Family Medicine

## 2016-12-01 VITALS — BP 108/78 | HR 79 | Temp 98.4°F | Ht 68.0 in | Wt 203.8 lb

## 2016-12-01 DIAGNOSIS — I10 Essential (primary) hypertension: Secondary | ICD-10-CM

## 2016-12-01 DIAGNOSIS — E785 Hyperlipidemia, unspecified: Secondary | ICD-10-CM

## 2016-12-01 DIAGNOSIS — Z23 Encounter for immunization: Secondary | ICD-10-CM

## 2016-12-01 DIAGNOSIS — Z1211 Encounter for screening for malignant neoplasm of colon: Secondary | ICD-10-CM | POA: Insufficient documentation

## 2016-12-01 DIAGNOSIS — Z0000 Encounter for general adult medical examination without abnormal findings: Secondary | ICD-10-CM | POA: Insufficient documentation

## 2016-12-01 DIAGNOSIS — K219 Gastro-esophageal reflux disease without esophagitis: Secondary | ICD-10-CM

## 2016-12-01 DIAGNOSIS — Z7189 Other specified counseling: Secondary | ICD-10-CM

## 2016-12-01 MED ORDER — CANDESARTAN CILEXETIL 16 MG PO TABS: 8.0000 mg | ORAL_TABLET | Freq: Every day | ORAL | 3 refills | Status: DC

## 2016-12-01 NOTE — Assessment & Plan Note (Signed)
PPSV23 due, d/w pt.

## 2016-12-01 NOTE — Assessment & Plan Note (Signed)
Advance directive- wife designated if patient were incapacitated.  

## 2016-12-01 NOTE — Progress Notes (Signed)
Hypertension:    Using medication without problems or lightheadedness: yes Chest pain with exertion:no Edema:no Short of breath:no Labs d/w pt.   Diet and exercise d/w pt, encouraged both.    HLD.  He declined statin.  D/w pt about rationale for treatment.   D/w pt about diet and exercise.    GERD.  Still on PPI, no ADE on med.  Controlled.   h/o prostate cancer.  He has yearly f/u with urology, I'll defer PSA to uro, d/w pt  Colonoscopy due, d/w pt.   He'll call about follow up.  I'll defer to patient.   PPSV23 due, d/w pt.  Done at [place].  Advance directive- wife designated if patient were incapacitated.    Still working part time.    Meds, vitals, and allergies reviewed.   PMH and SH reviewed  ROS: Per HPI unless specifically indicated in ROS section   GEN: nad, alert and oriented HEENT: mucous membranes moist NECK: supple w/o LA CV: rrr. PULM: ctab, no inc wob ABD: soft, +bs EXT: no edema

## 2016-12-01 NOTE — Assessment & Plan Note (Signed)
Still on PPI, no ADE on med.  Controlled.  D/w pt about diet and exercise.  Sx prev returned with med stoppage.  Continue.

## 2016-12-01 NOTE — Addendum Note (Signed)
Addended by: Lurlean Nanny on: 12/01/2016 02:08 PM   Modules accepted: Orders

## 2016-12-01 NOTE — Assessment & Plan Note (Signed)
Controlled, continue as is.  Labs d/w  Pt.  D/w pt about diet and exercise.  >25 minutes spent in face to face time with patient, >50% spent in counselling or coordination of care.

## 2016-12-01 NOTE — Patient Instructions (Signed)
Call about your colonoscopy.  Take care.  Glad to see you.  Update me as needed.

## 2016-12-01 NOTE — Progress Notes (Signed)
Pre visit review using our clinic review tool, if applicable. No additional management support is needed unless otherwise documented below in the visit note. 

## 2016-12-01 NOTE — Assessment & Plan Note (Signed)
He declined statin.  D/w pt about rationale for treatment.   D/w pt about diet and exercise.

## 2016-12-01 NOTE — Assessment & Plan Note (Signed)
Colonoscopy due, d/w pt.   He'll call about follow up.  I'll defer to patient.

## 2017-01-05 ENCOUNTER — Encounter: Payer: Self-pay | Admitting: Pulmonary Disease

## 2017-01-05 ENCOUNTER — Ambulatory Visit (INDEPENDENT_AMBULATORY_CARE_PROVIDER_SITE_OTHER): Payer: Medicare Other | Admitting: Pulmonary Disease

## 2017-01-05 VITALS — BP 100/66 | HR 83 | Ht 68.0 in | Wt 204.4 lb

## 2017-01-05 DIAGNOSIS — G4733 Obstructive sleep apnea (adult) (pediatric): Secondary | ICD-10-CM

## 2017-01-05 DIAGNOSIS — G4710 Hypersomnia, unspecified: Secondary | ICD-10-CM

## 2017-01-05 HISTORY — DX: Hypersomnia, unspecified: G47.10

## 2017-01-05 NOTE — Progress Notes (Signed)
Subjective:    Patient ID: Howard [name], male    DOB: [birth year], 66 y.o.   MRN: [id number]  HPI   This is the case of Howard [name], 66 y.o. Male, who was referred by Dr. Elsie Stain in consultation regarding possible OSA.   As you very well know, patient is a non smoker, is not known to have asthma or copd.  He has snoring, no witnessed apneas, occasional gasping or choking. Sleeps for 7 hrs/night. Wakes up 1x/night. Has hypersomnia in am.  Hypersomnia sometimes affects his fxnality.  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 He drives in the afternoon and denies hypersomnia.  Wife is concerned about snoring.  (-) abnormal behavior in sleep.      Review of Systems  Constitutional: Negative.  Negative for fever and unexpected weight change.  HENT: Negative.  Negative for congestion, dental problem, ear pain, nosebleeds, postnasal drip, rhinorrhea, sinus pressure, sneezing, sore throat and trouble swallowing.   Eyes: Negative.  Negative for redness and itching.  Respiratory: Negative.  Negative for cough, chest tightness, shortness of breath and wheezing.   Cardiovascular: Negative.  Negative for palpitations and leg swelling.  Gastrointestinal: Negative.  Negative for nausea and vomiting.  Endocrine: Negative.   Genitourinary: Negative.  Negative for dysuria.  Musculoskeletal: Negative.  Negative for joint swelling.  Skin: Negative.  Negative for rash.  Allergic/Immunologic: Negative.  Negative for environmental allergies, food allergies and immunocompromised state.  Neurological: Negative for headaches.  Hematological: Negative.  Does not bruise/bleed easily.  Psychiatric/Behavioral: Negative.  Negative for dysphoric mood. The patient is not nervous/anxious.    Past Medical History:  Diagnosis Date  . Anesthesia complication    prolonged sedation after colonoscopy  . Cancer [facility])    prostate  . Colon polyps    on  colonoscopy 08/2010  . ED (erectile dysfunction)   . GERD (gastroesophageal reflux disease)   . History of CT scan of chest 11/2004   negative  . Hypertension    Had prostate CA.  (-) CA  Family History  Problem Relation Age of Onset  . Stroke Mother   . Prostate cancer Neg Hx   . Colon cancer Neg Hx      Past Surgical History:  Procedure Laterality Date  . ESOPHAGOGASTRODUODENOSCOPY  10/02/2000   dilated reflux esopsh hh  . [facility]: Dehydration; vasvagal sync  12/09/ 05/2003  . PLANTAR FASCIA SURGERY Bilateral   . ROBOT ASSISTED LAPAROSCOPIC RADICAL PROSTATECTOMY N/A 05/27/2013   Procedure: ROBOTIC ASSISTED LAPAROSCOPIC RADICAL PROSTATECTOMY LEVEL 1;  Surgeon: Dutch Gray, MD;  Location: [facility] ORS;  Service: Urology;  Laterality: N/A;    Social History   Social History  . Marital status: Married    Spouse name: N/A  . Number of children: N/A  . Years of education: N/A   Occupational History  . Not on file.   Social History Main Topics  . Smoking status: Never Smoker  . Smokeless tobacco: Never Used  . Alcohol use No     Comment: no  . Drug use: No  . Sexual activity: Not on file   Other Topics Concern  . Not on file   Social History Narrative   Retired Advertising copywriter, now [employer]   Married 1976, lives with wife. 1 step daughter, 2 daughters and 1 son.   [place] fan     Allergies  Allergen Reactions  . Ibuprofen     "  Eats a hole in the lining of his stomach"     Outpatient Medications Prior to Visit  Medication Sig Dispense Refill  . candesartan (ATACAND) 16 MG tablet Take 0.5 tablets (8 mg total) by mouth daily. 45 tablet 3  . Omeprazole-Sodium Bicarbonate (ZEGERID OTC) 20-1100 MG CAPS Take 1 capsule by mouth daily before breakfast.     No facility-administered medications prior to visit.    No orders of the defined types were placed in this encounter.       Objective:   Physical Exam  Vitals:  Vitals:   01/05/17 1332  BP:  100/66  Pulse: 83  SpO2: 95%  Weight: 204 lb 6.4 oz (92.7 kg)  Height: 5\' 8"  (1.727 m)    Constitutional/General:  Pleasant, well-nourished, well-developed, not in any distress,  Comfortably seating.  Well kempt  Body mass index is 31.08 kg/m. Wt Readings from Last 3 Encounters:  01/05/17 204 lb 6.4 oz (92.7 kg)  12/01/16 203 lb 12 oz (92.4 kg)  11/25/16 201 lb 8 oz (91.4 kg)     HEENT: Pupils equal and reactive to light and accommodation. Anicteric sclerae. Normal nasal mucosa.   No oral  lesions,  mouth clear,  oropharynx clear, no postnasal drip. (-) Oral thrush. No dental caries.  Airway - Mallampati class III  Neck: No masses. Midline trachea. No JVD, (-) LAD. (-) bruits appreciated.  Respiratory/Chest: Grossly normal chest. (-) deformity. (-) Accessory muscle use.  Symmetric expansion. (-) Tenderness on palpation.  Resonant on percussion.  Diminished BS on both lower lung zones. (-) wheezing, crackles, rhonchi (-) egophony  Cardiovascular: Regular rate and  rhythm, heart sounds normal, no murmur or gallops, no peripheral edema  Gastrointestinal:  Normal bowel sounds. Soft, non-tender. No hepatosplenomegaly.  (-) masses.   Musculoskeletal:  Normal muscle tone. Normal gait.   Extremities: Grossly normal. (-) clubbing, cyanosis.  (-) edema  Skin: (-) rash,lesions seen.   Neurological/Psychiatric : alert, oriented to time, place, person. Normal mood and affect           Assessment & Plan:  Hypersomnia Patient has snoring, no witnessed apneas, occasional gasping or choking. Sleeps for 7 hrs/night. Wakes up 1x/night. Has hypersomnia in am.  Hypersomnia sometimes affects his fxnality.  He still works part time as a Banker. He lives in [place] and works in [place]. He drives in the afternoon and denies hypersomnia.  Wife is concerned about snoring.  (-) abnormal behavior in sleep.   Plan:  We discussed about the diagnosis of Obstructive Sleep  Apnea (OSA) and implications of untreated OSA. We discussed about CPAP and BiPaP as possible treatment options.    We will schedule the patient for a sleep study. Plan for home sleep study. Anticipate he'll probably be just mild sleep apnea if at all. If more than mild, anticipate no issues with auto CPAP. Wife was concerned about snoring to the point that they sleep in separate beds.Need to address snoring is sleep study is (-).    Patient was instructed to call the office if he/she has not heard back from the office 1-2 weeks after the sleep study.   Patient was instructed to call the office if he/she is having issues with the PAP device.   We discussed good sleep hygiene.   Patient was advised not to engage in activities requiring concentration and/or vigilance if he/she is sleepy.  Patient was advised not to drive if he/she is sleepy.       Thank you  very much for letting me participate in this patient's care. Please do not hesitate to give me a call if you have any questions or concerns regarding the treatment plan.   Patient will follow up with [place] in 8-10 weeks    J. Shirl Harris, MD 01/05/2017   2:02 PM Pulmonary and [place] Pager: [phone] Office: [phone], Fax: [phone]

## 2017-01-05 NOTE — Patient Instructions (Signed)
It was a pleasure taking care of you today!  We will schedule you to have a sleep study to determine if you have sleep apnea.   We will get a home sleep test.  You will be instructed to come back to the office to get an apparatus to sleep with overnight.  Once we have the apparatus, it will usually take [place] 1-2 weeks to read the study and get back at you with results of the test.  Please give [place] a call in 2 weeks after your study if you do not hear back from [place].    If the sleep study is positive, we will order you a CPAP  machine.  Please call the office if you do NOT receive your machine in the next 1-2 weeks.   Please make sure you use your CPAP device everytime you sleep.  We will monitor the usage of your machine per your insurance requirement.  Your insurance company may take the machine from you if you are not using it regularly.   Please clean the mask, tubings, filter, water reservoir with soapy water every week.  Please use distilled water for the water reservoir.   Please call the office or your machine provider (DME company) if you are having issues with the device.   Return to clinic in 8-10 weeks with NP

## 2017-01-05 NOTE — Assessment & Plan Note (Signed)
Patient has snoring, no witnessed apneas, occasional gasping or choking. Sleeps for 7 hrs/night. Wakes up 1x/night. Has hypersomnia in am.  Hypersomnia sometimes affects his fxnality.  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 He drives in the afternoon and denies hypersomnia.  Wife is concerned about snoring.  (-) abnormal behavior in sleep.   Plan:  We discussed about the diagnosis of Obstructive Sleep Apnea (OSA) and implications of untreated OSA. We discussed about CPAP and BiPaP as possible treatment options.    We will schedule the patient for a sleep study. Plan for home sleep study. Anticipate he'll probably be just mild sleep apnea if at all. If more than mild, anticipate no issues with auto CPAP. Wife was concerned about snoring to the point that they sleep in separate beds.Need to address snoring is sleep study is (-).    Patient was instructed to call the office if he/she has not heard back from the office 1-2 weeks after the sleep study.   Patient was instructed to call the office if he/she is having issues with the PAP device.   We discussed good sleep hygiene.   Patient was advised not to engage in activities requiring concentration and/or vigilance if he/she is sleepy.  Patient was advised not to drive if he/she is sleepy.

## 2017-01-26 DIAGNOSIS — G4733 Obstructive sleep apnea (adult) (pediatric): Secondary | ICD-10-CM | POA: Diagnosis not present

## 2017-01-30 ENCOUNTER — Telehealth: Payer: Self-pay | Admitting: Pulmonary Disease

## 2017-01-30 DIAGNOSIS — G4733 Obstructive sleep apnea (adult) (pediatric): Secondary | ICD-10-CM

## 2017-01-30 NOTE — Telephone Encounter (Signed)
Per RA, HST showed moderate OSA with 26 events per hour. Suggests a CPAP titration study next.

## 2017-02-01 NOTE — Telephone Encounter (Signed)
Left message for patient to call back  

## 2017-02-01 NOTE — Telephone Encounter (Signed)
Called and spoke with pt and he is aware of results of the HST.  He is aware of order placed for the cpap titration.  Nothing further is needed.

## 2017-02-01 NOTE — Telephone Encounter (Signed)
Pt returned phone call..ert ° ° °

## 2017-02-01 NOTE — Telephone Encounter (Signed)
Pt retuening call.Howard [name]

## 2017-02-02 ENCOUNTER — Other Ambulatory Visit: Payer: Self-pay | Admitting: *Deleted

## 2017-02-02 DIAGNOSIS — G4733 Obstructive sleep apnea (adult) (pediatric): Secondary | ICD-10-CM

## 2017-03-01 ENCOUNTER — Telehealth: Payer: Self-pay | Admitting: Acute Care

## 2017-03-01 NOTE — Telephone Encounter (Signed)
Left a message for patient to call back.   He has an appt with SG tomorrow at 9am. He has a CPAP titration test scheduled for next month and no order in the system for a CPAP. He will need to reschedule this appt until after he has received the CPAP machine and used it for at least 6 weeks.

## 2017-03-01 NOTE — Telephone Encounter (Signed)
Pt returned call and I let him know that he needed to resched appoint until after he has received cpap and used it @ least 6wks.Howard [name]

## 2017-03-02 ENCOUNTER — Ambulatory Visit: Payer: Medicare Other | Admitting: Acute Care

## 2017-03-13 ENCOUNTER — Encounter: Payer: Self-pay | Admitting: Family Medicine

## 2017-03-13 ENCOUNTER — Ambulatory Visit (INDEPENDENT_AMBULATORY_CARE_PROVIDER_SITE_OTHER): Payer: Medicare Other | Admitting: Family Medicine

## 2017-03-13 VITALS — BP 108/92 | HR 72 | Temp 98.4°F | Wt 207.5 lb

## 2017-03-13 DIAGNOSIS — M25512 Pain in left shoulder: Secondary | ICD-10-CM | POA: Diagnosis not present

## 2017-03-13 DIAGNOSIS — R55 Syncope and collapse: Secondary | ICD-10-CM

## 2017-03-13 DIAGNOSIS — I10 Essential (primary) hypertension: Secondary | ICD-10-CM | POA: Diagnosis not present

## 2017-03-13 NOTE — Patient Instructions (Addendum)
We'll get an echo set up. Rosaria Ferries will call about that.  Stop your BP med and update me if your BP is consistently >140/>90.  Use the shoulder exercises in the meantime.  Take care.  Glad to see you.

## 2017-03-13 NOTE — Progress Notes (Signed)
He had recent OSA testing done at home with the sleep lab f/u pending.  BP prev 100/66 in May at pulmonary appointment.    He has been taking 1/2 tab of candesartan but had a HA with that- he didn't have the HA when he skipped a dose occ.  This was an ongoing issue for the patient over the last few weeks.  Today he was at work and had a HA; had taken medicine today.  He took to Aspirin.  Then he had a sweat and looked pale per patient report.  EMS called and they called the office here upon eval.  He didn't have emergent sx and didn't need ER eval.  Here for eval today.   He was a little lightheaded and nauseated today.  HA is improved now.  No focal neuro changes, no weakness, no speech changes.  No syncope.  No CP, SOB, no BLE edema.   No LOC today or prev.    He has mechanical left shoulder pain. Not exertional. Unrelated to the above.  PMH and SH reviewed  ROS: Per HPI unless specifically indicated in ROS section   Meds, vitals, and allergies reviewed.   GEN: nad, alert and oriented HEENT: mucous membranes moist NECK: supple w/o LA CV: rrr PULM: ctab, no inc wob ABD: soft, +bs EXT: no edema SKIN: no acute rash Left shoulder with normal range of motion and no arm drop but he does have pain with external and internal rotation. External rotation pain especially improved with scapular assist. AC joint nontender.

## 2017-03-14 DIAGNOSIS — R55 Syncope and collapse: Secondary | ICD-10-CM

## 2017-03-14 DIAGNOSIS — M25512 Pain in left shoulder: Secondary | ICD-10-CM | POA: Insufficient documentation

## 2017-03-14 HISTORY — DX: Syncope and collapse: R55

## 2017-03-14 NOTE — Assessment & Plan Note (Signed)
See hypertension discussion.

## 2017-03-14 NOTE — Assessment & Plan Note (Signed)
Likely unrelated. No imaging needed. Discussed with patient about anatomy and home exercise program. Likely rotator cuff symptoms related to scapular dysfunction. He should be able to rehab his shoulder at home.

## 2017-03-14 NOTE — Assessment & Plan Note (Addendum)
Likely overtreated. It sounds like he has relative hypotension that caused his symptoms today, potentially contributing to the headache that he is previously had. Discussed with patient. Stop candesartan at this point. He will update me about his blood pressure. Okay for outpatient follow-up. EKG done, with atypical R-wave progression noted which is a change from previous. Compared to previous EKGs. No acute ST changes. No exertional pain. Reasonable to get echo at this point is to make sure he does not have any wall motion abnormalities, discussed with patient. He agrees. Okay to be done nonemergently.   >25 minutes spent in face to face time with patient, >50% spent in counselling or coordination of care.

## 2017-04-04 ENCOUNTER — Ambulatory Visit (HOSPITAL_BASED_OUTPATIENT_CLINIC_OR_DEPARTMENT_OTHER): Payer: Medicare Other | Attending: Pulmonary Disease | Admitting: Pulmonary Disease

## 2017-04-04 VITALS — Ht 69.0 in | Wt 205.0 lb

## 2017-04-04 DIAGNOSIS — G4733 Obstructive sleep apnea (adult) (pediatric): Secondary | ICD-10-CM

## 2017-04-11 ENCOUNTER — Telehealth: Payer: Self-pay | Admitting: Pulmonary Disease

## 2017-04-11 DIAGNOSIS — G4733 Obstructive sleep apnea (adult) (pediatric): Secondary | ICD-10-CM

## 2017-04-11 NOTE — Telephone Encounter (Signed)
Based on titration study, please send prescription to DME for   Auto CPAP 10-20 cm with a Medium size Fisher[employer]Paykel Full Face Mask Simplus mask and heated humidification  Download in 4 weeks Office visit in 6 weeks with NP/me

## 2017-04-11 NOTE — Procedures (Signed)
Patient Name: Jaykob, Minichiello Date: 04/04/2017 Gender: Male D.O.B: [birth year] Age (years): 89 Referring Provider: Kara Mead MD, ABSM Height (inches): 69 Interpreting Physician: Kara Mead MD, ABSM Weight (lbs): 205 RPSGT: Carolin Coy BMI: 30 MRN: [id number] Neck Size: 16.00   CLINICAL INFORMATION The patient is referred for a split night study with PAP. Most recent HST dated 01/26/2017 revealed an AHI of 26.8/h.   SLEEP STUDY TECHNIQUE As per the AASM Manual for the Scoring of Sleep and Associated Events v2.3 (April 2016) with a hypopnea requiring 4% desaturations.  The channels recorded and monitored were frontal, central and occipital EEG, electrooculogram (EOG), submentalis EMG (chin), nasal and oral airflow, thoracic and abdominal wall motion, anterior tibialis EMG, snore microphone, electrocardiogram, and pulse oximetry. Bi-level positive airway pressure (BiPAP) was initiated when the patient met split night criteria and was titrated according to treat sleep-disordered breathing.  RESPIRATORY PARAMETERS Titration  Optimal IPAP Pressure (cm): 20 Optimal EPAP Pressure (cm): 16 AHI at Optimal Pressure (/hr): 0.0 Min O2 at Optimal Pressure (%): 91.0 Sleep % at Optimal (%): 97 Supine % at Optimal (%): 0       SLEEP ARCHITECTURE The study was initiated at 10:47:32 PM and terminated at 5:26:35 AM. The total recorded time was 399.1 minutes. EEG confirmed total sleep time was 241.5 minutes yielding a sleep efficiency of 60.5%. Sleep onset after lights out was 7.7 minutes with a REM latency of 164.5 minutes. The patient spent 21.53% of the night in stage N1 sleep, 60.25% in stage N2 sleep, 1.86% in stage N3 and 16.36% in REM. Wake after sleep onset (WASO) was 149.9 minutes. The Arousal Index was 39.3/hour.  LEG MOVEMENT DATA The total Periodic Limb Movements of Sleep (PLMS) were 0. The PLMS index was 0.00 .  CARDIAC DATA The 2 lead EKG demonstrated sinus rhythm. The mean  heart rate was 55.69 beats per minute. Other EKG findings include: None.    IMPRESSIONS - BiPAP was used due to high pressures required on CPAP. A few centrals also noted on high pressures although RERAs persisted suggesting obstructive events An optimal BiPAP pressure was selected for this patient ( 20 /16 cm of water) - Mild central sleep apnea occurred during the diagnostic portion of the study (CAI = 6.7/hour). - The patient snored with Moderate snoring volume during the diagnostic portion of the study. - No cardiac abnormalities were noted during this study. - Clinically significant periodic limb movements of sleep did not occur during the study.   DIAGNOSIS - Obstructive Sleep Apnea (327.23 [G47.33 ICD-10])   RECOMMENDATIONS - Trial of Auto CPAP 10-20 cm with a Medium size Fisher&Paykel Full Face Mask Simplus mask and heated humidification. If this fails, BiPAP therapy with 20/16 cm H2O can be tried - Avoid alcohol, sedatives and other CNS depressants that may worsen sleep apnea and disrupt normal sleep architecture. - Sleep hygiene should be reviewed to assess factors that may improve sleep quality. - Weight management and regular exercise should be initiated or continued. - Return to [facility] for re-evaluation.   Kara Mead MD Board Certified in [place]

## 2017-04-13 NOTE — Telephone Encounter (Signed)
Spoke with patient. He is aware of RA's recs. He stated that he did not like the full face mask that was used during the titration. It caused his nose and face to be sore for several days. Advised patient that once he is set up with a DME company, he can pick up a mask that is more comfortable for him. He verbalized understanding. Advised patient to call us back once he has received his CPAP machine so that we can schedule him for a CPAP follow up.   Nothing else needed at time of call.

## 2017-04-14 ENCOUNTER — Ambulatory Visit (INDEPENDENT_AMBULATORY_CARE_PROVIDER_SITE_OTHER): Payer: Medicare Other

## 2017-04-14 ENCOUNTER — Other Ambulatory Visit: Payer: Self-pay

## 2017-04-14 DIAGNOSIS — R55 Syncope and collapse: Secondary | ICD-10-CM | POA: Diagnosis not present

## 2017-04-17 ENCOUNTER — Other Ambulatory Visit: Payer: Self-pay | Admitting: Family Medicine

## 2017-04-17 DIAGNOSIS — I712 Thoracic aortic aneurysm, without rupture: Secondary | ICD-10-CM

## 2017-04-17 DIAGNOSIS — I7121 Aneurysm of the ascending aorta, without rupture: Secondary | ICD-10-CM

## 2017-04-17 DIAGNOSIS — I77819 Aortic ectasia, unspecified site: Secondary | ICD-10-CM

## 2017-04-17 HISTORY — DX: Thoracic aortic aneurysm, without rupture: I71.2

## 2017-04-17 HISTORY — DX: Aneurysm of the ascending aorta, without rupture: I71.21

## 2017-05-16 ENCOUNTER — Encounter (INDEPENDENT_AMBULATORY_CARE_PROVIDER_SITE_OTHER): Payer: Self-pay

## 2017-05-16 ENCOUNTER — Ambulatory Visit (INDEPENDENT_AMBULATORY_CARE_PROVIDER_SITE_OTHER): Payer: Medicare Other | Admitting: Physician Assistant

## 2017-05-16 ENCOUNTER — Encounter: Payer: Self-pay | Admitting: Physician Assistant

## 2017-05-16 VITALS — BP 158/82 | HR 60 | Ht 69.0 in | Wt 202.8 lb

## 2017-05-16 DIAGNOSIS — I712 Thoracic aortic aneurysm, without rupture: Secondary | ICD-10-CM

## 2017-05-16 DIAGNOSIS — I7121 Aneurysm of the ascending aorta, without rupture: Secondary | ICD-10-CM

## 2017-05-16 DIAGNOSIS — I10 Essential (primary) hypertension: Secondary | ICD-10-CM | POA: Diagnosis not present

## 2017-05-16 DIAGNOSIS — E785 Hyperlipidemia, unspecified: Secondary | ICD-10-CM

## 2017-05-16 MED ORDER — LOSARTAN POTASSIUM 25 MG PO TABS: 25.0000 mg | ORAL_TABLET | Freq: Every day | ORAL | 3 refills | Status: DC

## 2017-05-16 NOTE — Patient Instructions (Addendum)
Medication Instructions:  1. START LOSARTAN 25 MG 1 TABLET DAILY; RX HAS BEEN SENT IN  Labwork: BMET TO BE DONE IN 2 WEEKS  Testing/Procedures: YOU WILL NEED TO BE SCHEDULED FOR A CHEST CT-A ; DX THORACIC AORTIC ANEURYSM; THIS WILL BE DONE IN OUR OFFICE   Follow-Up: Your physician wants you to follow-up in: [place], [place]  You will receive a reminder letter in the mail two months in advance. If you don't receive a letter, please call our office to schedule the follow-up appointment.   Any Other Special Instructions Will Be Listed Below (If Applicable).     If you need a refill on your cardiac medications before your next appointment, please call your pharmacy.                                Ascending Aortic Aneurysm/ Thoracic Aortic Aneurysm   Recent studies have raised concern that fluoroquinolone antibiotics could be associated with an increased risk of aortic aneurysm or aortic dissection. You should avoid use of Cipro and other associated antibiotics (flouroquinolone antibiotics )  It is  best to avoid activities that cause grunting or straining (medically referred to as a "valsalva maneuver"). This happens when a person bears down against a closed throat to increase the strength of arm or abdominal muscles. There's often a tendency to do this when lifting heavy weights, doing sit-ups, push-ups or chin-ups, etc., but it may be harmful.     An aneurysm is a bulge in an artery. It happens when blood pushes up against a weakened or damaged artery wall. A thoracic aortic aneurysm is an aneurysm that occurs in the first part of the aorta, between the heart and the diaphragm. The aorta is the main artery of the body. It supplies blood from the heart to the rest of the body. Some aneurysms may not cause symptoms or problems. However, the major concern with a thoracic aortic aneurysm is that it can enlarge and burst (rupture), or blood can flow between the  layers of the wall of the aorta through a tear (aorticdissection). Both of these conditions can cause bleeding inside the body and can be life-threatening if they are not diagnosed and treated right away. What are the causes? The exact cause of this condition is not known. What increases the risk? The following factors may make you more likely to develop this condition:  Being age 89 or older.  Having a hardening of the arteries caused by the buildup of fat and other substances in the lining of a blood vessel (arteriosclerosis).  Having inflammation of the walls of an artery (arteritis).  Having a genetic disease that weakens the body's connective tissue, such as Marfan syndrome.  Having an injury or trauma to the aorta.  Having an infection that is caused by bacteria, such as syphilis or staphylococcus, in the wall of the aorta (infectious aortitis).  Having high blood pressure (hypertension).  Being male.  Being white (Caucasian).  Having high cholesterol.  Having a family history of aneurysms.  Using tobacco.  Having chronic obstructive pulmonary disease (COPD). What are the signs or symptoms? Symptoms of this condition vary depending on the size and rate of growth of the aneurysm. Most grow slowly and do not cause any symptoms. When symptoms do occur, they may include:  Pain in the chest, back, sides, or abdomen. The pain may vary in  intensity. A sudden onset of severe pain may indicate that the aneurysm has ruptured.  Hoarseness.  Cough.  Shortness of breath.  Swallowing problems.  Swelling in the face, arms, or legs.  Fever.  Unexplained weight loss. How is this diagnosed? This condition may be diagnosed with:  An ultrasound.  X-rays.  A CT scan.  An MRI.  Tests to check the arteries for damage or blockages (angiogram). Most unruptured thoracic aortic aneurysms cause no symptoms, so they are often found during exams for other conditions. How is this  treated? Treatment for this condition depends on:  The size of the aneurysm.  How fast the aneurysm is growing.  Your age.  Risk factors for rupture. Aneurysms that are smaller than 2.2 inches (5.5 cm) may be managed by using medicines to control blood pressure, manage pain, or fight infection. You may need regular monitoring to see if the aneurysm is getting bigger. Your health care provider may recommend that you have an ultrasound every year or every 6 months. How often you need to have an ultrasound depends on the size of the aneurysm, how fast it is growing, and whether you have a family history of aneurysms. Surgical repair may be needed if your aneurysm is larger than 2.2 inches or if it is growing quickly. Follow these instructions at home: Eating and drinking   Eat a healthy diet. Your health care provider may recommend that you:  Lower your salt (sodium) intake. In some people, too much salt can raise blood pressure and increase the risk of thoracic aortic aneurysm.  Avoid foods that are high in saturated fat and cholesterol, such as red meat and dairy.  Eat a diet that is low in sugar.  Increase your fiber intake by including whole grains, vegetables, and fruits in your diet. Eating these foods may help to lower blood pressure.  Limit or avoid alcohol as recommended by your health care provider. Lifestyle   Follow instructions from your health care provider about healthy lifestyle habits. Your health care provider may recommend that you:  Do not use any products that contain nicotine or tobacco, such as cigarettes and e-cigarettes. If you need help quitting, ask your health care provider.  Keep your blood pressure within normal limits. The target limit for most people is below 120/80. Check your blood pressure regularly. If it is high, ask your health care provider about ways that you can control it.  Keep your blood sugar (glucose) level and cholesterol levels within  normal limits. Target limits for most people are:  Blood glucose level: Less than 100 mg/dL.  Total cholesterol level: Less than 200 mg/dL.  Maintain a healthy weight. Activity   Stay physically active and exercise regularly. Talk with your health care provider about how often you should exercise and ask which types of exercise are safe for you.  Avoid heavy lifting and activities that take a lot of effort (are strenuous). Ask your health care provider what activities are safe for you. General instructions   Keep all follow-up visits as told by your health care provider. This is important.  Talk with your health care provider about regular screenings to see if the aneurysm is getting bigger.  Take over-the-counter and prescription medicines only as told by your health care provider. Contact a health care provider if:  You have discomfort in your upper back, neck, or abdomen.  You have trouble swallowing.  You have a cough or hoarseness.  You have a family history  of aneurysms.  You have unexplained weight loss. Get help right away if:  You have sudden, severe pain in your upper back and abdomen. This pain may move into your chest and arms.  You have shortness of breath.  You have a fever. This information is not intended to replace advice given to you by your health care provider. Make sure you discuss any questions you have with your health care provider. Document Released: 08/01/2005 Document Revised: 05/13/2016 Document Reviewed: 05/13/2016 Elsevier Interactive Patient Education  2017 [place].   Aortic Dissection An aortic dissection happens when there is a tear in the main blood vessel of the body (aorta). The aorta comes out of the heart, curves around, and then goes down the chest (thoracic aorta) and into the abdomen (abdominal aorta) to supply arteries with blood. The wall of the aorta has inner and outer layers. Aortic dissection occurs most often in the  thoracic aorta. As the tear widens and blood flows through it, the aorta becomes "double-barreled." This means that one part of the aorta continues to carry blood to the body, but blood also flows into the tear, between the layers of the aorta. The torn part of the aorta fills with blood and swells up. This can reduce blood flow through the part of the aorta that is still supplying blood to the body. Aortic dissection is a medical emergency. What are the causes? An aortic dissection is commonly caused by weakening of the artery wall due to high blood pressure. Other causes may include:  An injury, such as from a car crash.  Birth defects that affect the heart (congenital heart defects).  Thickening of the artery walls. In some cases, the cause is not known. What increases the risk? The following factors may make you more likely to develop this condition:  Having certain medical conditions, such as:  High blood pressure (hypertension).  Hardening and narrowing of the arteries (atherosclerosis).  A genetic disorder that affects the connective tissue, such as Marfan syndrome or Ehlers-Danlos syndrome.  A condition that causes inflammation of blood vessels, such as giant cell arteritis.  Having a chest injury.  Having surgery on the aorta.  Being born with a congenital heart defect.  Being male.  Being older than age 68.  Using cocaine.  Smoking.  Lifting heavy weights or doing other types of high-intensity resistance training. What are the signs or symptoms? Signs and symptoms of aortic dissection start suddenly. The most common symptoms are:  Severe chest pain that may feel like tearing, stabbing, or sharp pain.  Severe pain that spreads (radiates) to the back, neck, jaw, or abdomen. Other symptoms may include:  Trouble breathing.  Dizziness or fainting.  Sudden weakness on one side of the body.  Nausea or vomiting.  Trouble swallowing.  Coughing up  blood.  Vomiting blood.  Clammy skin. How is this diagnosed? This condition may be diagnosed based on:  Your symptoms.  A physical exam. This may include:  Listening for abnormal blood flow sounds (murmurs) in your chest or abdomen.  Checking your pulse in your arms and legs.  Checking your blood pressure to see whether it is low or whether there is a difference between the measurements in your right and left arm.  Electrocardiogram (ECG). This test measures the electrical activity in your heart.  Chest X-ray.  CT scan.  MRI.  Aortic angiogram. This test involves injecting dye to make it easier to see your blood vessels clearly.  Echocardiogram  to study your heart using sound waves.  Blood tests. How is this treated? It is important to treat an aortic dissection as quickly as possible. Treatment may start as soon as your health care provider thinks that you have aortic dissection. Treatment depends on the location and severity of your dissection and your overall health. Treatment may include:  Medicines to lower your blood pressure.  Surgery to repair the dissected part of your aorta with artificial material (syntheticgraft).  A medical procedure to insert a stent-graft into the aorta (endovascular procedure). During this procedure, a long, thin tube (stent) is inserted into an artery near the groin (femoral artery) and moved up to the damaged part of the aorta. Then, the stent is opened to help improve blood flow and prevent future dissection. Follow these instructions at home: Activity   Avoid activities that could injure your chest or your abdomen. Ask your health care provider what activities are safe for you.  After you have recovered, try to stay active. Ask your health care provider what activities are safe for you after recovery.  Do not lift anything that is heavier than 10 lb (4.5 kg) until your health care provider approves.  Do not drive or use heavy  machinery while taking prescription pain medicine. Eating and drinking   Eat a heart-healthy diet, which includes lots of fresh fruits and vegetables, low-fat (lean) protein, and whole grains.  Check ingredients and nutrition facts on packaged foods and beverages, and avoid foods with high amounts of:  Salt (sodium).  Saturated fats (like red meat).  Trans fats (like fried food). General instructions   Take over-the-counter and prescription medicines only as told by your health care provider.  Work with your health care provider to manage your blood pressure.  Talk with your health care provider about how to manage stress.  Do not use any products that contain nicotine or tobacco, such as cigarettes and e-cigarettes. If you need help quitting, ask your health care provider.  Keep all follow-up visits as told by your health care provider. This is important. Get help right away if:  You develop any symptoms of aortic dissection after treatment, including severe pain in your chest, back, or abdomen.  You have a pain in your abdomen.  You have trouble breathing or you develop a cough.  You faint.  You develop a racing heartbeat. These symptoms may represent a serious problem that is an emergency. Do not wait to see if the symptoms will go away. Get medical help right away. Call your local emergency services (911 in the [place] Do not drive yourself to the hospital. Summary  An aortic dissection happens when there is a tear in the main blood vessel of the body (aorta). It is a medical emergency.  The most common symptom is severe pain in the chest that spreads (radiates) to the back, neck, jaw, or abdomen.  It is important to treat an aortic dissection as quickly as possible. Treatment typically includes surgery and medicines. This information is not intended to replace advice given to you by your health care provider. Make sure you discuss any questions you have with your health  care provider. Document Released: 11/08/2007 Document Revised: 06/20/2016 Document Reviewed: 06/20/2016 Elsevier Interactive Patient Education  2017 [employer].

## 2017-05-16 NOTE — Progress Notes (Signed)
Cardiology Office Note:    Date:  05/16/2017   ID:  Verlie, Hellenbrand May 25, 1951, MRN [id number]  PCP:  Tonia Ghent, MD  Cardiologist:  New - will est with Dr. Harrell Gave End / Richardson Dopp, PA-C   Referring MD: Tonia Ghent, MD   Chief Complaint  Patient presents with  . Thoracic Aortic Aneurysm    History of Present Illness:    Howard [name] is a 66 y.o. male with a hx of HTN who is being seen today for the evaluation of a thoracic aortic aneurysm noted on recent echocardiogram at the request of Tonia Ghent, MD.   Howard [name] was recently seen by primary care for symptoms of near syncope. His blood pressure was somewhat low and his candesartan was discontinued. There was some concerns about changes on his ECG and an echocardiogram was obtained. The echocardiogram demonstrated normal LV function with mild diastolic dysfunction and mild to moderately dilated aortic root at 4 cm as well as a moderately dilated ascending aorta at 4.3 cm.  He did see a cardiologist many years ago for what sounds like SVT in the setting of heavy caffeine use. He has not had a recurrence of palpitations that time. Currently, he denies chest pain, shortness of breath, syncope, paroxysmal nocturnal dyspnea, edema.    PAD Screen 05/16/2017  Previous PAD dx? No  Previous surgical procedure? No  Pain with walking? No  Feet/toe relief with dangling? No  Painful, non-healing ulcers? No  Extremities discolored? No    Prior CV studies:   The following studies were reviewed today:  Echocardiogram 04/14/17 EF 55-60, normal wall motion, grade 1 diastolic dysfunction, mild to moderate aortic root dilation (4 cm), moderately dilated ascending aorta (4.3 cm, aortic arch 3.3 cm), mild MR, normal RVSF  Chest CTA 12/07/2004 Vessels are well opacified and there is no evidence for a thoracic dissection or aneurysm. The pulmonary arteries are also well opacified and there is no evidence for pulmonary  embolus. There is no mediastinal, hilar or axillary adenopathy. Lung windows show no nodules, pleural effusions or infiltrates. There is minimal bibasilar atelectasis but no evidence for interstitial fibrosis. The findings on the recent chest x-ray may be related to hypoinflation. Images of the upper abdomen are unremarkable.   IMPRESSION:  No evidence for acute abnormality of the chest.    Past Medical History:  Diagnosis Date  . Anesthesia complication    prolonged sedation after colonoscopy  . Aphthous ulcer 01/13/2011  . ATOPIC RHINITIS 01/26/2007   Qualifier: Diagnosis of  By: Maxie Better FNP, Rosalita Levan   . Back pain 02/15/2011  . BPV (benign positional vertigo) 04/21/2016  . CERUMEN IMPACTION, LEFT 02/12/2009   Qualifier: Diagnosis of  By: Council Mechanic MD, Hilaria Ota   . Colon polyps    on colonoscopy 08/2010  . Conjunctivitis 01/28/2014  . Dilatation of aorta ([place]) 04/17/2017  . ED (erectile dysfunction)   . Essential hypertension 06/25/2008   Qualifier: Diagnosis of  By: Council Mechanic MD, Hilaria Ota   . ETD (eustachian tube dysfunction) 01/18/2013  . EXTERNAL HEMORRHOIDS 08/27/2009   Qualifier: Diagnosis of  By: Deborra Medina MD, Tanja Port    . GERD (gastroesophageal reflux disease)   . History of CT scan of chest 11/2004   negative  . HYPERLIPIDEMIA 07/24/2008   Qualifier: Diagnosis of  By: Council Mechanic MD, Hilaria Ota   . Hypersomnia 01/05/2017  . Left shoulder pain 03/14/2017  . Near syncope 03/14/2017  . Neck pain 12/15/2011  .  OSA (obstructive sleep apnea) 11/13/2016   Dr. Elsworth Soho  . Prostate cancer [facility])    s/p prostatectomy  . PROSTATITIS, 01/26/2007   Qualifier: Diagnosis of  By: Maxie Better FNP, Rosalita Levan   . Rash and nonspecific skin eruption 02/25/2016  . Rhinitis 08/04/2014  . Sebaceous cyst 02/13/2015  . Tinea 03/04/2016    Past Surgical History:  Procedure Laterality Date  . ESOPHAGOGASTRODUODENOSCOPY  10/02/2000   dilated reflux esopsh hh  . [facility]: Dehydration; vasvagal sync  12/09/ 05/2003    . PLANTAR FASCIA SURGERY Bilateral   . ROBOT ASSISTED LAPAROSCOPIC RADICAL PROSTATECTOMY N/A 05/27/2013   Procedure: ROBOTIC ASSISTED LAPAROSCOPIC RADICAL PROSTATECTOMY LEVEL 1;  Surgeon: Dutch Gray, MD;  Location: [facility];  Service: Urology;  Laterality: N/A;    Current Medications: Current Meds  Medication Sig  . Omeprazole-Sodium Bicarbonate (ZEGERID OTC) 20-1100 MG CAPS Take 1 capsule by mouth daily before breakfast.     Allergies:   Ibuprofen   Social History   Social History  . Marital status: Married    Spouse name: N/A  . Number of children: N/A  . Years of education: N/A   Occupational History  . Retired     [employer] Dept   Social History Main Topics  . Smoking status: Never Smoker  . Smokeless tobacco: Never Used  . Alcohol use No     Comment: no  . Drug use: No  . Sexual activity: Not Asked   Other Topics Concern  . None   Social History Narrative   Retired Advertising copywriter, now [employer]   Married 1976, lives with wife. 1 step daughter, 2 daughters and 1 son.   [place] [employer] fan     Family Hx: The patient's family history includes Stroke in his mother. There is no history of Prostate cancer, Colon cancer, or Aortic dissection.  ROS:   Please see the history of present illness.    ROS All other systems reviewed and are negative.   EKGs/Labs/Other Test Reviewed:    EKG:  EKG is  ordered today.  The ekg ordered today demonstrates Sinus bradycardia, HR 56, normal axis, QTc 389 ms  Recent Labs: 11/25/2016: ALT 10; BUN 22; Creatinine, Ser 1.22; Hemoglobin 14.8; Platelets 286.0; Potassium 4.6; Sodium 136   Recent Lipid Panel Lab Results  Component Value Date/Time   CHOL 231 (H) 11/25/2016 01:34 PM   TRIG 136.0 11/25/2016 01:34 PM   HDL 42.30 11/25/2016 01:34 PM   CHOLHDL 5 11/25/2016 01:34 PM   LDLCALC 161 (H) 11/25/2016 01:34 PM   LDLDIRECT 172.4 12/13/2012 08:08 AM    Physical Exam:    VS:  BP (!) 158/82 (BP  Location: Left Arm, Patient Position: Sitting, Cuff Size: Normal)   Pulse 60   Ht 5\' 9"  (1.753 m)   Wt 202 lb 12.8 oz (92 kg)   BMI 29.95 kg/m     Wt Readings from Last 3 Encounters:  05/16/17 202 lb 12.8 oz (92 kg)  04/04/17 205 lb (93 kg)  03/13/17 207 lb 8 oz (94.1 kg)     Physical Exam  Constitutional: He is oriented to person, place, and time. He appears well-developed and well-nourished. No distress.  HENT:  Head: Normocephalic and atraumatic.  Eyes: No scleral icterus.  Neck: No JVD present.  Cardiovascular: Normal rate, regular rhythm and normal heart sounds.   No murmur heard. Pulmonary/Chest: Effort normal. He has no rales.  Abdominal: Soft. There is no tenderness.  Musculoskeletal: He exhibits  no edema.  Neurological: He is alert and oriented to person, place, and time.  Skin: Skin is warm and dry.  Psychiatric: He has a normal mood and affect.    ASSESSMENT:    1. Thoracic ascending aortic aneurysm ([place])   2. Essential hypertension   3. Hyperlipidemia, unspecified hyperlipidemia type    PLAN:    In order of problems listed above:  1. Thoracic ascending aortic aneurysm ([facility]) Recent echocardiogram with aortic root 4.0 cm and ascending aorta 4.3 cm.  He does not have a known family hx of aortic aneurysm or aortic dissection.  We discussed lifting precautions (<75 lbs) and to avoid unnecessary straining or valsalva.  We also discussed avoiding fluoroquinolone antibiotics.  He needs better blood pressure control.  His Candesartan was stopped recently due to low blood pressure.  He cannot take a beta-blocker due to asymptomatic bradycardia.  However, I think he should be able to tolerate Losartan 25 mg QD.    -  Information re: Thoracic Aortic Aneurysm given (see Patient Instructions)   -  Arrange Chest CTA to size aortic root and ascending aortic aneurysm  -  Start Losartan 25 mg QD  -  Goal BP < 130/80  -  BMET 2 weeks  -  Plan follow up with Dr. Harrell Gave End  in 1 year (or sooner if needed).  2. Essential hypertension Start Losartan 25 mg Once daily as noted.  3. Hyperlipidemia, unspecified hyperlipidemia type Managed by PCP.    Dispo:  Return in about 1 year (around 05/16/2018) for Routine Follow Up, w/ Dr. Saunders Revel, or Richardson Dopp, PA-C.   Medication Adjustments/Labs and Tests Ordered: Current medicines are reviewed at length with the patient today.  Concerns regarding medicines are outlined above.  Orders/Tests:  Orders Placed This Encounter  Procedures  . CT ANGIO CHEST AORTA W &/OR WO CONTRAST  . Basic Metabolic Panel (BMET)  . EKG 12-Lead   Medication changes: Meds ordered this encounter  Medications  . losartan (COZAAR) 25 MG tablet    Sig: Take 1 tablet (25 mg total) by mouth daily.    Dispense:  90 tablet    Refill:  3   Signed, Richardson Dopp, PA-C  05/16/2017 10:36 AM    [address], [place], [place]  [address] Phone: [phone]; Fax: [phone]

## 2017-05-17 ENCOUNTER — Telehealth: Payer: Self-pay | Admitting: Family Medicine

## 2017-05-17 NOTE — Telephone Encounter (Signed)
[place] [facility]     Patient Name: Howard [name] Initial Comment Caller has questions about cutting back on sodium and sugar.  DOB: 1951-02-01      Nurse Assessment  Nurse: Arthor Captain RN, Margaret Date/Time (Eastern Time): 05/17/2017 3:02:18 PM  Confirm and document reason for call. If symptomatic, describe symptoms. ---Caller wants to know if there is a recommended amount of Na and sugar that he should be eating. Saw the doctor yesterday and he is to change his diet for his heart. No symptoms at present. Instructed The Dietary Guidelines recommend that the general population consume no more than 2,300 milligrams of sodium a day per the FDA. The American Heart Association (AHA) recommends no more than 6 teaspoons (25 grams) of added sugar per day for women and 9 teaspoons (38 grams) for men.For a person eating a 2000 calorie diet, this would be 22 grams of saturated fat or less per day   Does the patient have any new or worsening symptoms? ---No     Guidelines     Guideline Title Affirmed Question Affirmed Notes        Final Disposition User   Clinical Call [place], RN, Joycelyn Schmid

## 2017-05-17 NOTE — Telephone Encounter (Signed)
PLEASE NOTE: All timestamps contained within this report are represented as [place] Standard Time. CONFIDENTIALTY NOTICE: This fax transmission is intended only for the addressee. It contains information that is legally privileged, confidential or otherwise protected from use or disclosure. If you are not the intended recipient, you are strictly prohibited from reviewing, disclosing, copying using or disseminating any of this information or taking any action in reliance on or regarding this information. If you have received this fax in error, please notify us immediately by telephone so that we can arrange for its return to [place]. Phone: [phone], Toll-Free: [phone], Fax: [phone] Page: 1 of 1 Call Id: [id number] [place] Patient Name: Howard [name] Gender: Male DOB: 1951/04/30 Age: 66 Y 68 M 18 D Return Phone Number: [phone] (Primary), [phone] (Secondary) Address: City/State/Zip: [place] [address] Client [facility] - Client Client Site [place] Physician Renford Dills - MD Contact Type Call Who Is Calling Patient / Member / Family / Caregiver Call Type Triage / Clinical Relationship To Patient Self Return Phone Number [phone] (Primary) Chief Complaint Health information question (non symptomatic) Reason for Call Symptomatic / Request for [place] has questions about cutting back on sodium and sugar. Appointment Disposition EMR Appointment Not Necessary Info pasted into Epic Yes Translation No Nurse Assessment Nurse: Arthor Captain, RN, Margaret Date/Time (Eastern Time): 05/17/2017 3:02:18 PM Confirm and document reason for call. If symptomatic, describe symptoms. ---Caller wants to know if there is a recommended amount of Na and sugar that he should be eating. Saw the doctor yesterday and he is  to change his diet for his heart. No symptoms at present. Instructed The Dietary Guidelines recommend that the general population consume no more than 2,300 milligrams of sodium a day per the FDA. The American Heart Association (AHA) recommends no more than 6 teaspoons (25 grams) of added sugar per day for women and 9 teaspoons (38 grams) for men.For a person eating a 2000 calorie diet, this would be 22 grams of saturated fat or less per day Does the patient have any new or worsening symptoms? ---No Guidelines Guideline Title Affirmed Question Affirmed Notes Nurse Date/Time (Bruce Time) Disp. Time Eilene Ghazi Time) Disposition Final User 05/17/2017 3:09:39 PM Send To RN Personal Cockrum, RN, Joycelyn Schmid 05/17/2017 3:08:43 PM Clinical Call Yes Cockrum, RN, Joycelyn Schmid

## 2017-05-27 ENCOUNTER — Other Ambulatory Visit: Payer: Self-pay | Admitting: Family Medicine

## 2017-05-30 ENCOUNTER — Other Ambulatory Visit: Payer: Medicare Other | Admitting: *Deleted

## 2017-05-30 ENCOUNTER — Telehealth: Payer: Self-pay | Admitting: *Deleted

## 2017-05-30 DIAGNOSIS — I10 Essential (primary) hypertension: Secondary | ICD-10-CM

## 2017-05-30 LAB — BASIC METABOLIC PANEL
BUN/Creatinine Ratio: 16 (ref 10–24)
BUN: 17 mg/dL (ref 8–27)
CALCIUM: 10.7 mg/dL — AB (ref 8.6–10.2)
CO2: 26 mmol/L (ref 20–29)
CREATININE: 1.04 mg/dL (ref 0.76–1.27)
Chloride: 103 mmol/L (ref 96–106)
GFR, EST AFRICAN AMERICAN: 85 mL/min/{1.73_m2} (ref >59)
GFR, EST NON AFRICAN AMERICAN: 74 mL/min/{1.73_m2} (ref >59)
Glucose: 70 mg/dL (ref 65–99)
POTASSIUM: 4.8 mmol/L (ref 3.5–5.2)
Sodium: 142 mmol/L (ref 134–144)

## 2017-05-30 NOTE — Telephone Encounter (Signed)
I was speaking with the pt when he cell phone lost connection. Pt was here in our office earlier today for lab work. Pt asked if we could call him to discuss about the leg and hip pain he has had. I called and s/w pt who states he had leg/hip pain Sunday that lasted for about 3 hours and went away, again today since early morning. Pt was wondering if the Losartan that was started 05/16/17 when he saw Richardson Dopp, PAC. I did discuss this with the pt that his right leg/hip pain was not coming from the Losartan per Richardson Dopp, PAC. Pt was advised to f/u with PCP in regards to leg/hip pain. Pt did state to me that he had a little chest pain the other day though only lasted a few minutes. Pt states he has been under some stress.

## 2017-06-01 ENCOUNTER — Ambulatory Visit (INDEPENDENT_AMBULATORY_CARE_PROVIDER_SITE_OTHER)
Admission: RE | Admit: 2017-06-01 | Discharge: 2017-06-01 | Disposition: A | Discharge status: Home / Self Care | Payer: Medicare Other | Source: Ambulatory Visit | Attending: Physician Assistant | Admitting: Physician Assistant

## 2017-06-01 DIAGNOSIS — I712 Thoracic aortic aneurysm, without rupture: Secondary | ICD-10-CM

## 2017-06-01 DIAGNOSIS — I7121 Aneurysm of the ascending aorta, without rupture: Secondary | ICD-10-CM

## 2017-06-01 IMAGING — CT CT ANGIO CHEST
2 of 7 series · 18 of 46 positions shown · IV contrast (isovue)
Comparison: CT scan of [DATE].

CLINICAL DATA: Thoracic ascending aortic aneurysm.

EXAM:
CT ANGIOGRAPHY CHEST WITH CONTRAST
TECHNIQUE: Multidetector CT imaging of the chest was performed using the
standard protocol during bolus administration of intravenous
contrast. Multiplanar CT image reconstructions and MIPs were
obtained to evaluate the vascular anatomy.
CONTRAST:  100 mL of Isovue 370 intravenously.

[Series 4: aorta 3.0 i31f 2 · axial · 0.82mm/px · z∈[-312,-24]mm · 15 of 104 slices shown]
[im 4/104  lung]
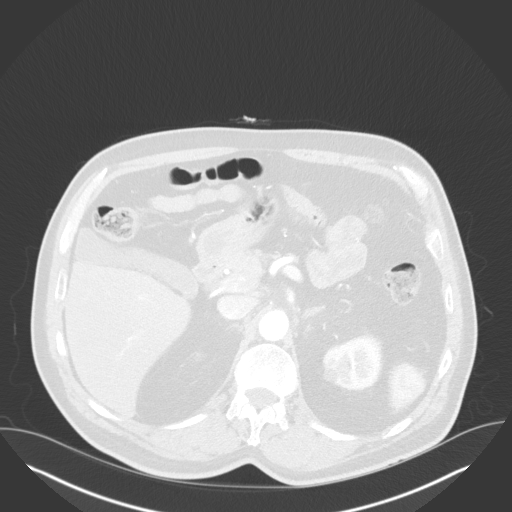
[im 12/104  soft-tissue]
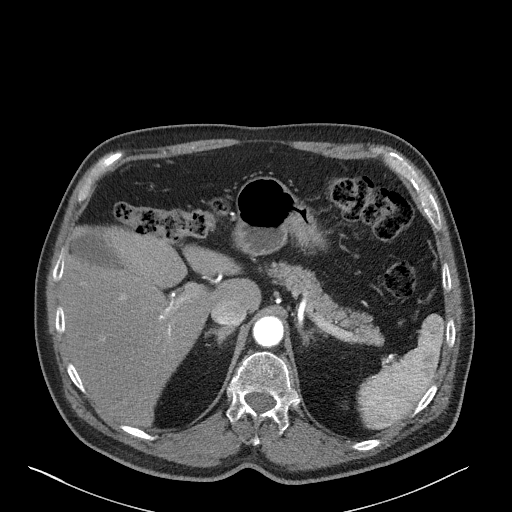
[im 20/104  lung]
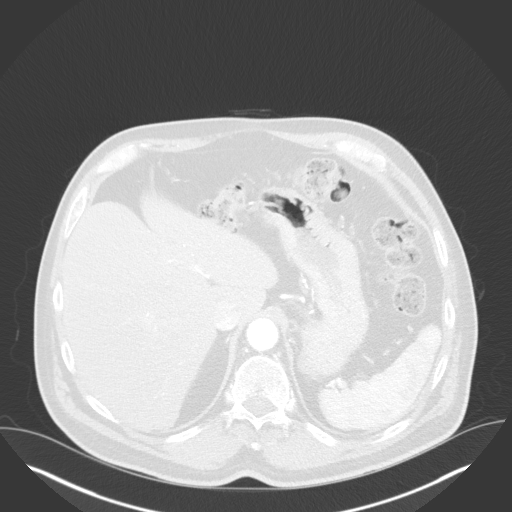
[im 24/104  soft-tissue]
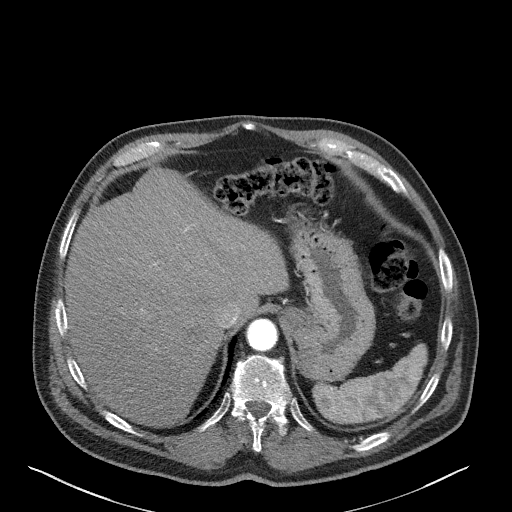
[im 32/104  lung]
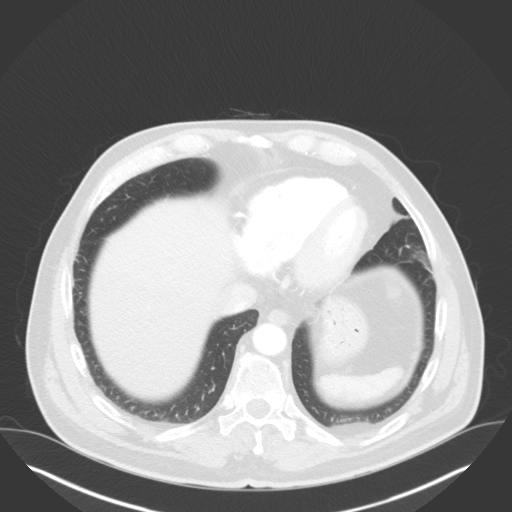
[im 40/104  soft-tissue]
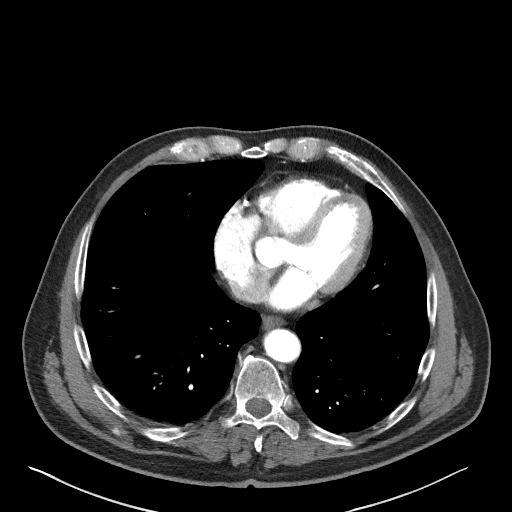
[im 44/104  lung]
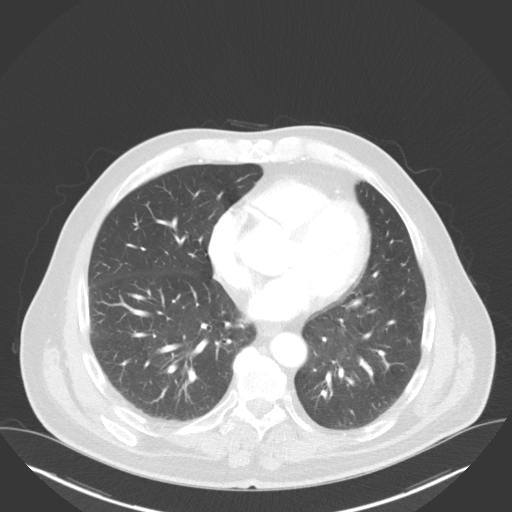
[im 52/104  soft-tissue]
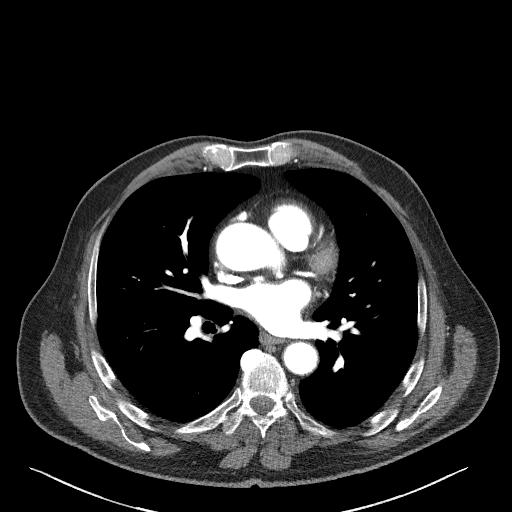
[im 60/104  lung]
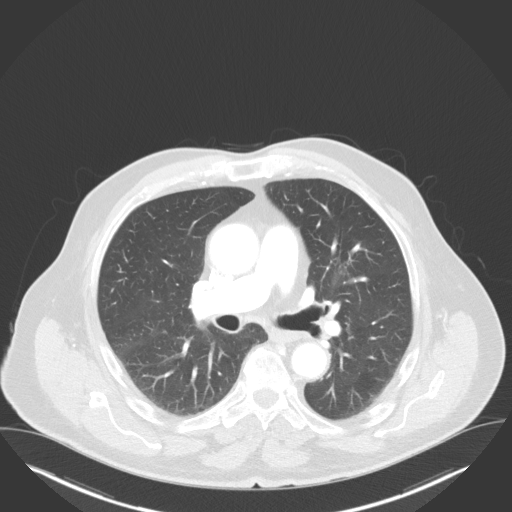
[im 64/104  soft-tissue]
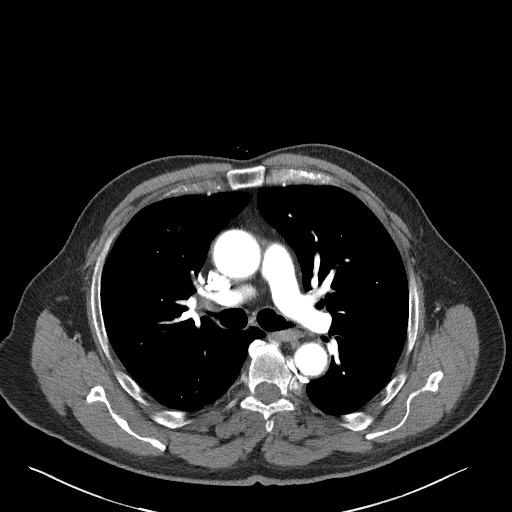
[im 72/104  lung]
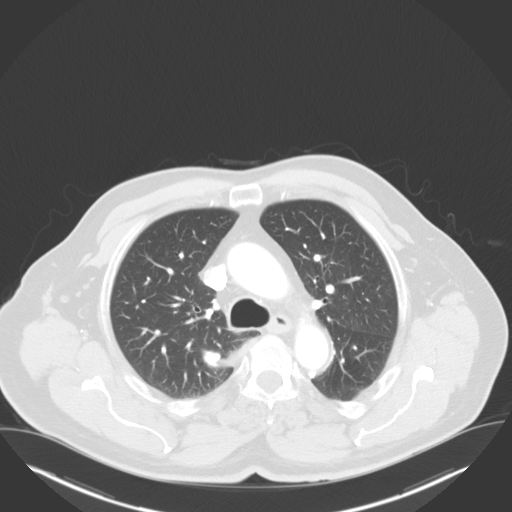
[im 80/104  soft-tissue]
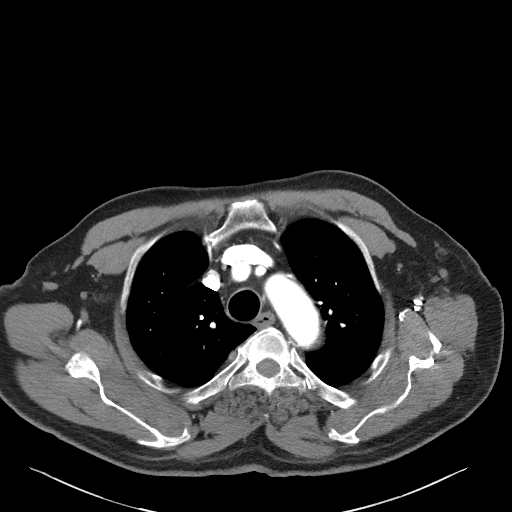
[im 84/104  lung]
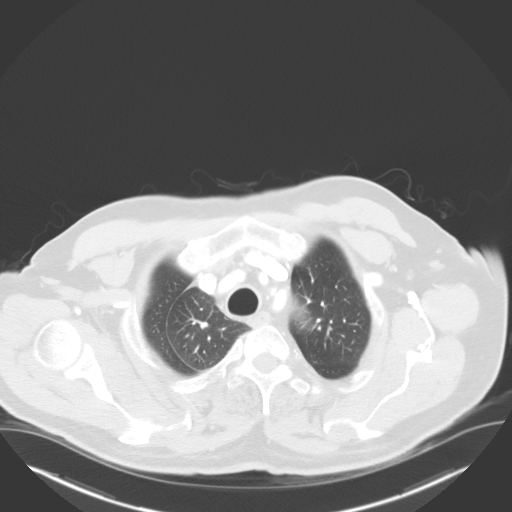
[im 92/104  soft-tissue]
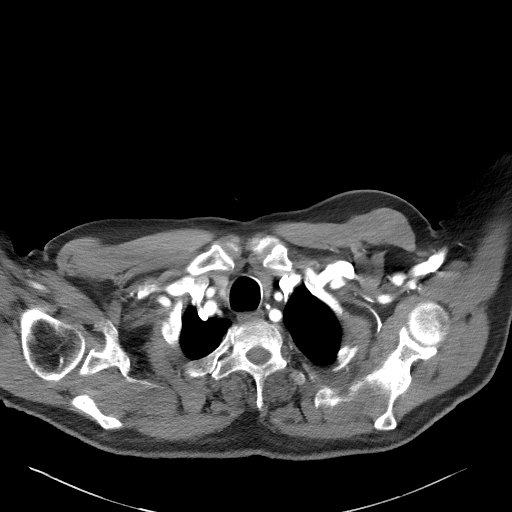
[im 100/104  lung]
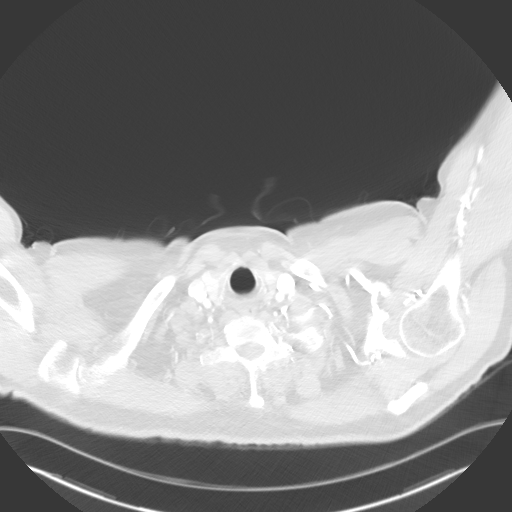

[Series 7: coronals · coronal · 0.62mm/px · 3 of 139 slices shown]
[im 35/139  soft-tissue]
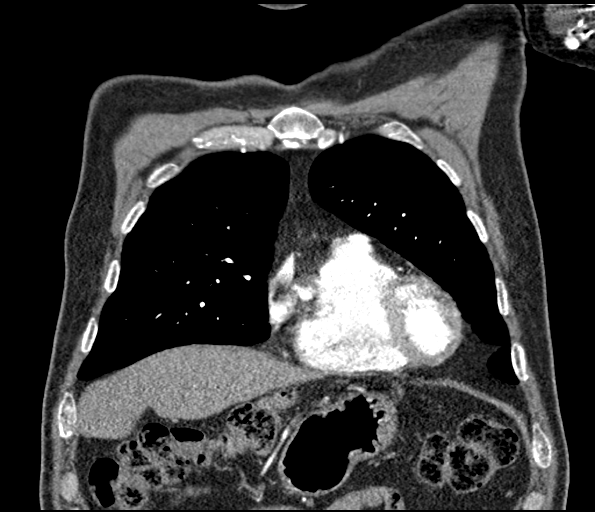
[im 70/139  soft-tissue]
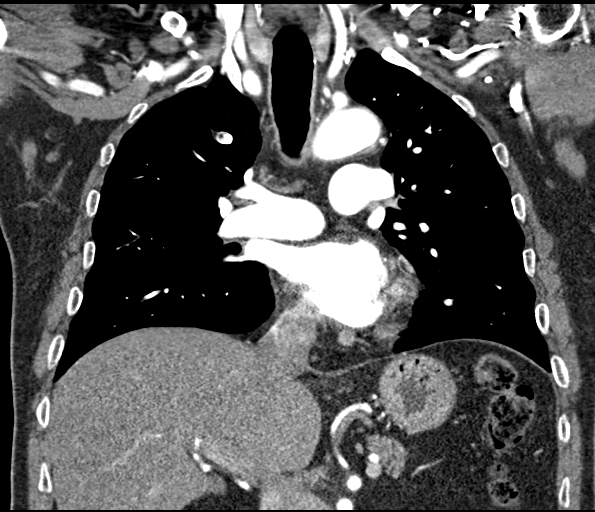
[im 104/139  soft-tissue]
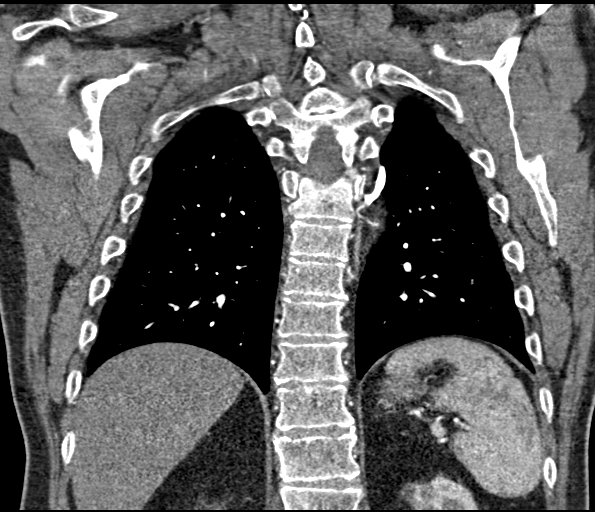

[18 of 46 positions shown; findings below may reference images not displayed]

FINDINGS: Cardiovascular: 4.1 cm ascending thoracic aortic aneurysm is noted.
3 cm transverse aortic arch is noted. 3 cm proximal descending
thoracic aorta is noted. No dissection is noted. Aortic root is
dilated at 4.5 cm. Normal cardiac size. No pericardial effusion is
noted. Coronary artery calcification noted.

Mediastinum/Nodes: No enlarged mediastinal, hilar, or axillary lymph
nodes. Thyroid gland, trachea, and esophagus demonstrate no
significant findings.

Lungs/Pleura: Lungs are clear. No pleural effusion or pneumothorax.

Upper Abdomen: No acute abnormality.

Musculoskeletal: No chest wall abnormality. No acute or significant
osseous findings.

Review of the MIP images confirms the above findings.
IMPRESSION: 4.1 cm ascending thoracic aortic aneurysm. Recommend annual imaging
followup by CTA or MRA. This recommendation follows [7T]
ACCF/AHA/AATS/ACR/ASA/SCA/GERATHS/GERATHS/GERATHS/GERATHS Guidelines for the
Diagnosis and Management of Patients with Thoracic Aortic Disease.
Circulation. [7T]; 121: [id number].

Coronary artery calcifications are noted suggesting coronary artery
disease.

## 2017-06-01 MED ORDER — IOPAMIDOL (ISOVUE-370) INJECTION 76%
100.0000 mL | Freq: Once | INTRAVENOUS | Status: AC | PRN
  Administered 2017-06-01: 100 mL via INTRAVENOUS

## 2017-06-02 ENCOUNTER — Telehealth: Payer: Self-pay | Admitting: *Deleted

## 2017-06-02 ENCOUNTER — Ambulatory Visit (INDEPENDENT_AMBULATORY_CARE_PROVIDER_SITE_OTHER): Payer: Medicare Other | Admitting: Family Medicine

## 2017-06-02 ENCOUNTER — Encounter: Payer: Self-pay | Admitting: Family Medicine

## 2017-06-02 ENCOUNTER — Ambulatory Visit (INDEPENDENT_AMBULATORY_CARE_PROVIDER_SITE_OTHER): Payer: Medicare Other | Admitting: Adult Health

## 2017-06-02 ENCOUNTER — Encounter: Payer: Self-pay | Admitting: Adult Health

## 2017-06-02 DIAGNOSIS — Z23 Encounter for immunization: Secondary | ICD-10-CM

## 2017-06-02 DIAGNOSIS — I712 Thoracic aortic aneurysm, without rupture, unspecified: Secondary | ICD-10-CM

## 2017-06-02 DIAGNOSIS — G4733 Obstructive sleep apnea (adult) (pediatric): Secondary | ICD-10-CM

## 2017-06-02 DIAGNOSIS — I7121 Aneurysm of the ascending aorta, without rupture: Secondary | ICD-10-CM
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
## 2017-06-02 NOTE — Patient Instructions (Signed)
Wear CPAP At bedtime  . Goal is to wear each night for >4hrs each night.  Work on healthy weight.  Do not drive if sleepy .  Follow up in 6 weeks with download .  May call back if want to switch to dreamwear full face mask.

## 2017-06-02 NOTE — Telephone Encounter (Signed)
Follow up      Patient returning call for results. Please call

## 2017-06-02 NOTE — Patient Instructions (Signed)
Go to the lab on the way out.  We'll contact you with your lab report. Try ice/rolling a coke can under your arch, with the tylenol.  Take care.  Glad to see you.

## 2017-06-02 NOTE — Assessment & Plan Note (Signed)
Moderate OSA  Encouraged on compliance  Mask options reviewed   Plan  Patient Instructions  Wear CPAP At bedtime  . Goal is to wear each night for >4hrs each night.  Work on healthy weight.  Do not drive if sleepy .  Follow up in 6 weeks with download .  May call back if want to switch to dreamwear full face mask.

## 2017-06-02 NOTE — Telephone Encounter (Signed)
Left message to go over CT-A results .

## 2017-06-02 NOTE — Progress Notes (Signed)
CTA d/w pt.  See report and outside clinic recs.    Flu shot done today.    Calcium elevation d/w pt.  He doesn't feel unwell, no "B" sx.  No TUMS.  No extra calcium/vit D.  No clear trigger known.  Ddx and PTH pathophysiology discussed with patient.  Complaint with CPAP and sleeping better.    Leg complaint. R leg pain.  "it felt like I sprained something" on Sunday but then it got better on Monday.  Then had return of similar sx later on Tuesday, down the T leg.  Leg pain resolved now but some pain in the arch of the R foot.  It doesn't improve with stretching.  It doesn't feel like prev plantar fasciitis pain. He didn't know if some gait changes from prev pain were contributing to recent sx.    Meds, vitals, and allergies reviewed.   ROS: Per HPI unless specifically indicated in ROS section   nad ncat Mmm Neck supple, no masses, no LA rrr Ctab abd soft, not ttp Mid arch is sore R foot.  Dec but not absent sensation with monofilament on the R foot compared to the L Right foot not tender to palpation over bony prominences. Mortise is intact. No bruising or skin changes on the foot.

## 2017-06-02 NOTE — Telephone Encounter (Signed)
-----   Message from Liliane Shi, [place] sent at 06/02/2017  1:56 PM EDT ----- Please call the patient. His ascending thoracic aortic aneurysm on CT scan measures 4.1 cm.  This is fairly small.  Continue current medications and follow-up as planned. Schedule repeat chest CTA in 1 year. Please fax a copy of this study result to his PCP:  Tonia Ghent, MD  Thanks! Richardson Dopp, PA-C    06/02/2017 1:52 PM

## 2017-06-02 NOTE — Telephone Encounter (Signed)
Pt has been notified of CTA results/findings by phone with verbal understanding. Pt agreeable to repeat CT-A in 1 yr. Pt thanked me for my call today.

## 2017-06-04 NOTE — Assessment & Plan Note (Signed)
Unclear source. This could've been a spurious result. Would still need workup for confirmation versus evaluation. Discussed with patient about PTH pathophysiology. Check vitamin D, PTH, calcium, PTH related peptide. Rationale discussed with patient. See notes on labs.  Unclear about the source of the pain he was having in the right leg, doubt that this is related to the elevated calcium level. The leg pain itself seems to be better now but he still has some tenderness near the mid arch on the right foot. Discussed with him about icing, stretching. Treat supportively in the meantime. At this point imaging is likely not useful as there is no concern for fracture.  >25 minutes spent in face to face time with patient, >50% spent in counselling or coordination of care.

## 2017-06-08 LAB — VITAMIN D 25 HYDROXY (VIT D DEFICIENCY, FRACTURES): VIT D 25 HYDROXY: 24 ng/mL — AB (ref 30–100)

## 2017-06-08 LAB — PTH-RELATED PEPTIDE

## 2017-06-08 LAB — PTH, INTACT AND CALCIUM
Calcium: 9.8 mg/dL (ref 8.6–10.3)
PTH: 60 pg/mL (ref 14–64)

## 2017-06-08 NOTE — Progress Notes (Signed)
Reviewed & agree with plan  

## 2017-06-09 ENCOUNTER — Other Ambulatory Visit: Payer: Self-pay | Admitting: Family Medicine

## 2017-06-09 ENCOUNTER — Telehealth: Payer: Self-pay | Admitting: Radiology

## 2017-06-09 MED ORDER — VITAMIN D 1000 UNITS PO TABS: 1000.0000 [IU] | ORAL_TABLET | Freq: Every day | ORAL | Status: DC

## 2017-06-09 NOTE — Telephone Encounter (Signed)
See result note.  I think this was already handled.  Thanks.   Routed to LF only to close the loop.   Call pt. Repeat calcium was normal. PTH normal.  We ended up not needing the PTH related peptide done, since the other tests were okay.  Vit D is a little low, reasonable to start 1000 IU vit D a day, OTC. Thanks.

## 2017-06-09 NOTE — Telephone Encounter (Signed)
I spoke to the patient about his PTH results, he asked about his Vit D being low. Please let him know if you want to do anything further at this time about Vit D

## 2017-07-14 ENCOUNTER — Encounter: Payer: Self-pay | Admitting: Adult Health

## 2017-07-14 ENCOUNTER — Ambulatory Visit (INDEPENDENT_AMBULATORY_CARE_PROVIDER_SITE_OTHER): Payer: Medicare Other | Admitting: Adult Health

## 2017-07-14 ENCOUNTER — Telehealth: Payer: Self-pay

## 2017-07-14 DIAGNOSIS — G4733 Obstructive sleep apnea (adult) (pediatric): Secondary | ICD-10-CM | POA: Diagnosis not present

## 2017-07-14 NOTE — Assessment & Plan Note (Addendum)
Well controlled on CPAP   Plan  . Patient Instructions  Wear CPAP At bedtime  . Goal is to wear each night for >4hrs each night.  Work on healthy weight.  Do not drive if sleepy .  Follow up in 4-6 months with Dr. Elsworth Soho  And As needed

## 2017-07-14 NOTE — Progress Notes (Signed)
@Patient  ID: Howard [name], male    DOB: 05-Jan-1951, 66 y.o.   MRN: [id number]  Chief Complaint  Patient presents with  . Follow-up    OSA     Referring provider: Tonia Ghent, MD  HPI: 66 yo male seen for sleep consult 03/2017 for daytime sleepiness and snoring found to have moderate OSA   TEST  HST dated 01/26/2017 revealed an AHI of 26.8/h. CPAP titration study 03/2017 >Trial of Auto CPAP 10-20 cm with a Medium size [employer] Full Face Mask Simplus mask and heated humidification. If this fails, BiPAP therapy with 20/16 cm H2O can be tried  07/14/2017 OSA  Patient returns for a 6-week follow-up.  He was recently started on CPAP for OSA.  He says he has been doing good on CPAP.  He feels rested with no significant daytime sleepiness.  He is wearing his CPAP each night.  Getting about 5 hours.  Download shows excellent compliance with average usage at 5.5 hours.  He is on AutoSet 10 to 27th H2O.  AHI 5.8.  Minimal leaks. He wants to try the dreamwear full face mask, demo sample given.     Allergies  Allergen Reactions  . Ibuprofen     "Eats a hole in the lining of his stomach"    Immunization History  Administered Date(s) Administered  . Influenza Split 06/08/2011  . Influenza, High Dose Seasonal PF 06/02/2017  . Influenza,inj,Quad PF,6+ Mos 04/20/2016  . Pneumococcal Conjugate-13 08/27/2015  . Pneumococcal Polysaccharide-23 12/01/2016  . Td 06/25/2008  . Zoster 07/05/2010    Past Medical History:  Diagnosis Date  . Anesthesia complication    prolonged sedation after colonoscopy  . Aphthous ulcer 01/13/2011  . ATOPIC RHINITIS 01/26/2007   Qualifier: Diagnosis of  By: Maxie Better FNP, Rosalita Levan   . Back pain 02/15/2011  . BPV (benign positional vertigo) 04/21/2016  . CERUMEN IMPACTION, LEFT 02/12/2009   Qualifier: Diagnosis of  By: Council Mechanic MD, Hilaria Ota   . Colon polyps    on colonoscopy 08/2010  . Conjunctivitis 01/28/2014  . ED (erectile dysfunction)    . Essential hypertension 06/25/2008   Qualifier: Diagnosis of  By: Council Mechanic MD, Hilaria Ota   . ETD (eustachian tube dysfunction) 01/18/2013  . EXTERNAL HEMORRHOIDS 08/27/2009   Qualifier: Diagnosis of  By: Deborra Medina MD, Tanja Port    . GERD (gastroesophageal reflux disease)   . History of CT scan of chest 11/2004   negative  . HYPERLIPIDEMIA 07/24/2008   Qualifier: Diagnosis of  By: Council Mechanic MD, Hilaria Ota   . Hypersomnia 01/05/2017  . Left shoulder pain 03/14/2017  . Near syncope 03/14/2017  . Neck pain 12/15/2011  . OSA (obstructive sleep apnea) 11/13/2016   Dr. Elsworth Soho  . Prostate cancer [facility])    s/p prostatectomy  . PROSTATITIS, 01/26/2007   Qualifier: Diagnosis of  By: Maxie Better FNP, Rosalita Levan   . Rash and nonspecific skin eruption 02/25/2016  . Rhinitis 08/04/2014  . Sebaceous cyst 02/13/2015  . Thoracic ascending aortic aneurysm ([place]) 04/17/2017   Chest CTA 10/18: Ascending thoracic aneurysm 4.1 cm, aortic root 4.5 cm >> follow-up 1 year // Echo 8/18:  - Ascending aorta: The ascending aorta was moderately dilated, 4.3 cm. Aortic arch is 3.3 cm  . Tinea 03/04/2016    Tobacco History: Social History   Tobacco Use  Smoking Status Never Smoker  Smokeless Tobacco Never Used   Counseling given: Not Answered   Outpatient Encounter Medications as of 07/14/2017  Medication Sig  .  cholecalciferol (VITAMIN D) 1000 units tablet Take 1 tablet (1,000 Units total) by mouth daily.  Marland Kitchen losartan (COZAAR) 25 MG tablet Take 1 tablet (25 mg total) by mouth daily.  Earney Navy Bicarbonate (ZEGERID OTC) 20-1100 MG CAPS Take 1 capsule by mouth daily before breakfast.   No facility-administered encounter medications on file as of 07/14/2017.      Review of Systems  Constitutional:   No  weight loss, night sweats,  Fevers, chills, fatigue, or  lassitude.  HEENT:   No headaches,  Difficulty swallowing,  Tooth/dental problems, or  Sore throat,                No sneezing, itching, ear ache, nasal  congestion, post nasal drip,   CV:  No chest pain,  Orthopnea, PND, swelling in lower extremities, anasarca, dizziness, palpitations, syncope.   GI  No heartburn, indigestion, abdominal pain, nausea, vomiting, diarrhea, change in bowel habits, loss of appetite, bloody stools.   Resp: No shortness of breath with exertion or at rest.  No excess mucus, no productive cough,  No non-productive cough,  No coughing up of blood.  No change in color of mucus.  No wheezing.  No chest wall deformity  Skin: no rash or lesions.  GU: no dysuria, change in color of urine, no urgency or frequency.  No flank pain, no hematuria   MS:  No joint pain or swelling.  No decreased range of motion.  No back pain.    Physical Exam  BP 116/78 (BP Location: Left Arm, Cuff Size: Normal)   Pulse 63   Ht 5\' 9"  (1.753 m)   Wt 202 lb 3.2 oz (91.7 kg)   SpO2 98%   BMI 29.86 kg/m   GEN: A/Ox3; pleasant , NAD, well nourished    HEENT:  [place]/AT,  EACs-clear, TMs-wnl, NOSE-clear, THROAT-clear, no lesions, no postnasal drip or exudate noted. Class 2 MP airway   NECK:  Supple w/ fair ROM; no JVD; normal carotid impulses w/o bruits; no thyromegaly or nodules palpated; no lymphadenopathy.    RESP  Clear  P & A; w/o, wheezes/ rales/ or rhonchi. no accessory muscle use, no dullness to percussion  CARD:  RRR, no m/r/g, no peripheral edema, pulses intact, no cyanosis or clubbing.  GI:   Soft & nt; nml bowel sounds; no organomegaly or masses detected.   Musco: Warm bil, no deformities or joint swelling noted.   Neuro: alert, no focal deficits noted.    Skin: Warm, no lesions or rashes    Lab Results:  CBC    Component Value Date/Time   WBC 9.2 11/25/2016 1334   RBC 4.99 11/25/2016 1334   HGB 14.8 11/25/2016 1334   HCT 44.1 11/25/2016 1334   PLT 286.0 11/25/2016 1334   MCV 88.4 11/25/2016 1334   MCH 28.6 09/26/2015 1709   MCHC 33.5 11/25/2016 1334   RDW 13.6 11/25/2016 1334   LYMPHSABS 2.9 11/25/2016 1334     MONOABS 0.9 11/25/2016 1334   EOSABS 0.2 11/25/2016 1334   BASOSABS 0.0 11/25/2016 1334    BMET    Component Value Date/Time   NA 142 05/30/2017 1100   K 4.8 05/30/2017 1100   CL 103 05/30/2017 1100   CO2 26 05/30/2017 1100   GLUCOSE 70 05/30/2017 1100   GLUCOSE 95 11/25/2016 1334   BUN 17 05/30/2017 1100   CREATININE 1.04 05/30/2017 1100   CALCIUM 9.8 06/02/2017 1524   GFRNONAA 74 05/30/2017 1100   GFRAA 85 05/30/2017  1100    BNP No results found for: BNP  ProBNP No results found for: PROBNP  Imaging: No results found.   Assessment & Plan:   OSA (obstructive sleep apnea) Well controlled on CPAP   Plan  . Patient Instructions  Wear CPAP At bedtime  . Goal is to wear each night for >4hrs each night.  Work on healthy weight.  Do not drive if sleepy .  Follow up in 4-6 months with Dr. Elsworth Soho  And As needed    May call back if want to switch to dreamwear full face mask.          Rexene Edison, NP 07/14/2017

## 2017-07-14 NOTE — Telephone Encounter (Signed)
Copied from [place] [id number]. Topic: Referral - Request >> Jul 14, 2017 10:17 AM Ahmed Prima L wrote: Reason for CRM:  Pt said that he has been going to [facility] for his legs, they told him he needs to be seen at the vascular place, and so he is needing a referral from Dr. Damita Dunnings. Per [employer]. Please advise & call back is [phone]

## 2017-07-14 NOTE — Patient Instructions (Addendum)
Wear CPAP At bedtime  . Goal is to wear each night for >4hrs each night.  Work on healthy weight.  Do not drive if sleepy .  Follow up in 4-6 months with Dr. Elsworth Soho  And As needed

## 2017-07-16 NOTE — Telephone Encounter (Signed)
Glad to put in the referral but I need the OV notes first.  Thanks.

## 2017-07-17 NOTE — Telephone Encounter (Signed)
Faxed for last OV note.

## 2017-07-19 ENCOUNTER — Ambulatory Visit (INDEPENDENT_AMBULATORY_CARE_PROVIDER_SITE_OTHER): Payer: Medicare Other | Admitting: Family Medicine

## 2017-07-19 ENCOUNTER — Encounter: Payer: Self-pay | Admitting: Family Medicine

## 2017-07-19 DIAGNOSIS — L03019 Cellulitis of unspecified finger: Secondary | ICD-10-CM

## 2017-07-19 DIAGNOSIS — I739 Peripheral vascular disease, unspecified: Secondary | ICD-10-CM | POA: Diagnosis not present

## 2017-07-19 MED ORDER — DOXYCYCLINE HYCLATE 100 MG PO TABS: 100.0000 mg | ORAL_TABLET | Freq: Two times a day (BID) | ORAL | 0 refills | Status: DC

## 2017-07-19 NOTE — Progress Notes (Signed)
L 3rd finger.  Puffy proximal to the nail bed.  Less sore now compared to a few days ago.  Started last week.  Not draining.  No trauma, but he may have picked at a hangnail/skin locally.     R leg pain.  He had pain about 2 months ago.  Intermittent pain.  Saw ortho, thought to have arch flattening and got inserts.  They talked to him about possible vascular referral at [place] last week.  He does get calf pain occ with walking.  No chest pain.  No L leg sx.    Meds, vitals, and allergies reviewed.   ROS: Per HPI unless specifically indicated in ROS section   nad ncat Normal DP and PT pulse R foot.  Foot with normal inspection.  Normal cap refill.  Calf not ttp L 3rd finger with erythema proximal to the nail.  No abscess noted.  Normal ROM.  NV intact.

## 2017-07-19 NOTE — Patient Instructions (Addendum)
Start the antibiotic.  Update me if not better.   Let me get the notes from ortho and see about vascular testing vs referral vs both.  Take care.  Glad to see you.

## 2017-07-20 DIAGNOSIS — L03019 Cellulitis of unspecified finger: Secondary | ICD-10-CM | POA: Insufficient documentation

## 2017-07-20 DIAGNOSIS — I739 Peripheral vascular disease, unspecified: Secondary | ICD-10-CM | POA: Insufficient documentation

## 2017-07-20 NOTE — Assessment & Plan Note (Signed)
No abscess to drain.  Start doxy, update me as needed.  He agrees.

## 2017-07-20 NOTE — Assessment & Plan Note (Signed)
But with normal pulses.  Will check LE art dopplers and we have requested ortho records.

## 2017-07-27 NOTE — Telephone Encounter (Signed)
Have you received the last OV note?

## 2017-07-27 NOTE — Telephone Encounter (Signed)
Not yet. Thanks.

## 2017-07-27 NOTE — Telephone Encounter (Signed)
Faxed again for info.

## 2017-07-28 NOTE — Telephone Encounter (Signed)
Information received and placed in Dr. Josefine Class In [place].

## 2017-07-29 NOTE — Telephone Encounter (Signed)
I saw the note from 07/13/17 from Dr. Veverly Fells.   There is no mention of seeing vascular clinic in the ortho notes.  It mentions seeing Dr. Doran Durand (also with ortho) if his foot sx don't improve.  That seems reasonable. He does get calf pain occ with walking.  He has vascular study scheduled.  I would keep that and if needed we can direct him back to cardiology based on those results.   It does not appear at this point that he needs a vascular surgery referral.  Thanks.

## 2017-07-31 NOTE — Telephone Encounter (Signed)
Patient advised.

## 2017-08-11 ENCOUNTER — Ambulatory Visit (INDEPENDENT_AMBULATORY_CARE_PROVIDER_SITE_OTHER): Payer: Medicare Other

## 2017-08-11 DIAGNOSIS — I739 Peripheral vascular disease, unspecified: Secondary | ICD-10-CM

## 2017-09-15 ENCOUNTER — Encounter: Payer: Self-pay | Admitting: Family Medicine

## 2017-09-15 ENCOUNTER — Ambulatory Visit (INDEPENDENT_AMBULATORY_CARE_PROVIDER_SITE_OTHER): Payer: Medicare Other | Admitting: Family Medicine

## 2017-09-15 DIAGNOSIS — M79672 Pain in left foot: Secondary | ICD-10-CM | POA: Diagnosis not present

## 2017-09-15 NOTE — Patient Instructions (Signed)
Use the ankle brace in the meantime.   Ice your ankle.  5 minutes on/off.   Tylenol for pain.  Keep using your inserts.   Take care.  Glad to see you.

## 2017-09-15 NOTE — Progress Notes (Signed)
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    L foot pain.  Got better over the last few months when he wasn't working and was off his feet.  Monday did a lot of walking at [facility], but no pain at the time.  Tuesday with pain in the foot.  Woke up with pain Tuesday AM.  H/o B plantar fasciitis pain.  Pain near the achilles, along the arch, and the 4th dn 5th toes.  No trauma.  No R foot pain.  No bruising.  A little puffy locally on the foot.  Had been icing his foot.    Meds, vitals, and allergies reviewed.   ROS: Per HPI unless specifically indicated in ROS section   nad L foot with normal inspection.  No bruising, no redness.   Medial and lateral ankle tendon sheaths ttp but mal not ttp B Normal DP pulse.   Plantar fascia ttp but achilles not ttp Able to bear weight.

## 2017-09-17 DIAGNOSIS — M79673 Pain in unspecified foot: Secondary | ICD-10-CM | POA: Insufficient documentation

## 2017-09-17 DIAGNOSIS — M7740 Metatarsalgia, unspecified foot: Secondary | ICD-10-CM | POA: Insufficient documentation

## 2017-09-17 NOTE — Assessment & Plan Note (Signed)
Likely a tenon strain medially and laterally. No need for imaging at this point.  Use a lace up ankle brace, continue to use inserts that he already has, and update me as needed.  He agrees.  See avs.

## 2017-11-27 ENCOUNTER — Other Ambulatory Visit: Payer: Self-pay | Admitting: Family Medicine

## 2017-11-27 DIAGNOSIS — I10 Essential (primary) hypertension: Secondary | ICD-10-CM

## 2017-11-27 DIAGNOSIS — E559 Vitamin D deficiency, unspecified: Secondary | ICD-10-CM

## 2017-11-27 DIAGNOSIS — Z125 Encounter for screening for malignant neoplasm of prostate: Secondary | ICD-10-CM

## 2017-11-28 ENCOUNTER — Ambulatory Visit: Payer: Medicare Other

## 2017-11-30 ENCOUNTER — Ambulatory Visit (INDEPENDENT_AMBULATORY_CARE_PROVIDER_SITE_OTHER): Payer: Medicare Other

## 2017-11-30 VITALS — BP 124/88 | HR 74 | Temp 98.9°F | Ht 68.25 in | Wt 200.8 lb

## 2017-11-30 DIAGNOSIS — I10 Essential (primary) hypertension: Secondary | ICD-10-CM

## 2017-11-30 DIAGNOSIS — E559 Vitamin D deficiency, unspecified: Secondary | ICD-10-CM | POA: Diagnosis not present

## 2017-11-30 DIAGNOSIS — Z125 Encounter for screening for malignant neoplasm of prostate: Secondary | ICD-10-CM | POA: Diagnosis not present

## 2017-11-30 DIAGNOSIS — Z0000 Encounter for general adult medical examination without abnormal findings: Secondary | ICD-10-CM | POA: Diagnosis not present

## 2017-11-30 LAB — COMPREHENSIVE METABOLIC PANEL
ALBUMIN: 4.1 g/dL (ref 3.5–5.2)
ALK PHOS: 91 U/L (ref 39–117)
ALT: 12 U/L (ref 0–53)
AST: 16 U/L (ref 0–37)
BUN: 19 mg/dL (ref 6–23)
CALCIUM: 10 mg/dL (ref 8.4–10.5)
CO2: 28 meq/L (ref 19–32)
Chloride: 103 mEq/L (ref 96–112)
Creatinine, Ser: 1.3 mg/dL (ref 0.40–1.50)
GFR: 58.36 mL/min — AB (ref >60.00)
Glucose, Bld: 102 mg/dL — ABNORMAL HIGH (ref 70–99)
Potassium: 5.2 mEq/L — ABNORMAL HIGH (ref 3.5–5.1)
Sodium: 138 mEq/L (ref 135–145)
Total Bilirubin: 0.6 mg/dL (ref 0.2–1.2)
Total Protein: 7 g/dL (ref 6.0–8.3)

## 2017-11-30 LAB — LIPID PANEL
CHOL/HDL RATIO: 5
CHOLESTEROL: 196 mg/dL (ref 0–200)
HDL: 37.1 mg/dL — AB (ref >39.00)
LDL Cholesterol: 126 mg/dL — ABNORMAL HIGH (ref 0–99)
NonHDL: 158.91
TRIGLYCERIDES: 163 mg/dL — AB (ref 0.0–149.0)
VLDL: 32.6 mg/dL (ref 0.0–40.0)

## 2017-11-30 LAB — PSA, MEDICARE: PSA: 0 ng/ml — ABNORMAL LOW (ref 0.10–4.00)

## 2017-11-30 LAB — VITAMIN D 25 HYDROXY (VIT D DEFICIENCY, FRACTURES): VITD: 27.28 ng/mL — ABNORMAL LOW (ref 30.00–100.00)

## 2017-11-30 NOTE — Progress Notes (Signed)
PCP notes:   Health maintenance:  Colon cancer screening - PCP please address at next appt  Abnormal screenings:   Hearing - failed  Hearing Screening   125Hz  250Hz  500Hz  1000Hz  2000Hz  3000Hz  4000Hz  6000Hz  8000Hz   Right ear:   0 0 40  0    Left ear:   40 40 40  0     Mini-Cog score: 19/20 MMSE - Mini Mental State Exam 11/30/2017 11/25/2016  Orientation to time 5 5  Orientation to Place 5 5  Registration 3 3  Attention/ Calculation 0 0  Recall 2 3  Recall-comments unable to recall 1 of 3 words -  Language- name 2 objects 0 0  Language- repeat 1 1  Language- follow 3 step command 3 3  Language- read & follow direction 0 0  Write a sentence 0 0  Copy design 0 0  Total score 19 20   Patient concerns:   None  Nurse concerns:  None  Next PCP appt:   12/08/17 @ 1415  I reviewed health advisor's note, was available for consultation on the day of service listed in this note, and agree with documentation and plan. Elsie Stain, MD.

## 2017-11-30 NOTE — Patient Instructions (Addendum)
Mr. Howard [name] , Thank you for taking time to come for your Medicare Wellness Visit. I appreciate your ongoing commitment to your health goals. Please review the following plan we discussed and let me know if I can assist you in the future.   These are the goals we discussed: Goals    . Follow up with Primary Care Provider     Starting 11/30/2017, I will continue to take medications as prescribed and to keep appointments with PCP as scheduled.        This is a list of the screening recommended for you and due dates:  Health Maintenance  Topic Date Due  . Colon Cancer Screening  12/13/2018*  . Flu Shot  03/15/2018  . Tetanus Vaccine  06/25/2018  .  Hepatitis C: One time screening is recommended by Center for Disease Control  (CDC) for  adults born from [birth year] through 1966.   Completed  . Pneumonia vaccines  Completed  *Topic was postponed. The date shown is not the original due date.   Preventive Care for Adults  A healthy lifestyle and preventive care can promote health and wellness. Preventive health guidelines for adults include the following key practices.  . A routine yearly physical is a good way to check with your health care provider about your health and preventive screening. It is a chance to share any concerns and updates on your health and to receive a thorough exam.  . Visit your dentist for a routine exam and preventive care every 6 months. Brush your teeth twice a day and floss once a day. Good oral hygiene prevents tooth decay and gum disease.  . The frequency of eye exams is based on your age, health, family medical history, use  of contact lenses, and other factors. Follow your health care provider's recommendations for frequency of eye exams.  . Eat a healthy diet. Foods like vegetables, fruits, whole grains, low-fat dairy products, and lean protein foods contain the nutrients you need without too many calories. Decrease your intake of foods high in solid fats, added  sugars, and salt. Eat the right amount of calories for you. Get information about a proper diet from your health care provider, if necessary.  . Regular physical exercise is one of the most important things you can do for your health. Most adults should get at least 150 minutes of moderate-intensity exercise (any activity that increases your heart rate and causes you to sweat) each week. In addition, most adults need muscle-strengthening exercises on 2 or more days a week.  Silver Sneakers may be a benefit available to you. To determine eligibility, you may visit the website: www.silversneakers.com or contact program at 754-790-3444 Mon-Fri between 8AM-8PM.   . Maintain a healthy weight. The body mass index (BMI) is a screening tool to identify possible weight problems. It provides an estimate of body fat based on height and weight. Your health care provider can find your BMI and can help you achieve or maintain a healthy weight.   For adults 20 years and older: ? A BMI below 18.5 is considered underweight. ? A BMI of 18.5 to 24.9 is normal. ? A BMI of 25 to 29.9 is considered overweight. ? A BMI of 30 and above is considered obese.   . Maintain normal blood lipids and cholesterol levels by exercising and minimizing your intake of saturated fat. Eat a balanced diet with plenty of fruit and vegetables. Blood tests for lipids and cholesterol should begin at age 47  and be repeated every 5 years. If your lipid or cholesterol levels are high, you are over 50, or you are at high risk for heart disease, you may need your cholesterol levels checked more frequently. Ongoing high lipid and cholesterol levels should be treated with medicines if diet and exercise are not working.  . If you smoke, find out from your health care provider how to quit. If you do not use tobacco, please do not start.  . If you choose to drink alcohol, please do not consume more than 2 drinks per day. One drink is considered to  be 12 ounces (355 mL) of beer, 5 ounces (148 mL) of wine, or 1.5 ounces (44 mL) of liquor.  . If you are 86-43 years old, ask your health care provider if you should take aspirin to prevent strokes.  . Use sunscreen. Apply sunscreen liberally and repeatedly throughout the day. You should seek shade when your shadow is shorter than you. Protect yourself by wearing long sleeves, pants, a wide-brimmed hat, and sunglasses year round, whenever you are outdoors.  . Once a month, do a whole body skin exam, using a mirror to look at the skin on your back. Tell your health care provider of new moles, moles that have irregular borders, moles that are larger than a pencil eraser, or moles that have changed in shape or color.

## 2017-11-30 NOTE — Progress Notes (Signed)
Subjective:   Howard [name] is a 67 y.o. male who presents for Medicare Annual/Subsequent preventive examination.  Review of Systems:  N/A Cardiac Risk Factors include: advanced age (>96men, >42 women);male gender;hypertension;obesity (BMI >30kg/m2)     Objective:    Vitals: BP 124/88 (BP Location: Right Arm, Patient Position: Sitting, Cuff Size: Normal)   Pulse 74   Temp 98.9 F (37.2 C) (Oral)   Ht 5' 8.25" (1.734 m) Comment: no shoes  Wt 200 lb 12 oz (91.1 kg)   SpO2 96%   BMI 30.30 kg/m   Body mass index is 30.3 kg/m.  Advanced Directives 11/30/2017 04/04/2017 11/25/2016 09/26/2015 07/31/2015 05/27/2013 05/10/2013  Does Patient Have a Medical Advance Directive? No No No No No Patient would like information Patient does not have advance directive;Patient would like information  Would patient like information on creating a medical advance directive? No - Patient declined No - Patient declined No - Patient declined No - patient declined information Yes - Scientist, clinical (histocompatibility and immunogenetics) given Other (Comment) Advance directive packet given  Pre-existing out of facility DNR order (yellow form or pink MOST form) - - - - - No No    Tobacco Social History   Tobacco Use  Smoking Status Never Smoker  Smokeless Tobacco Never Used     Counseling given: No   Clinical Intake:  Pre-visit preparation completed: Yes  Pain : No/denies pain Pain Score: 0-No pain     Nutritional Status: BMI > 30  Obese Nutritional Risks: None Diabetes: No  How often do you need to have someone help you when you read instructions, pamphlets, or other written materials from your doctor or pharmacy?: 1 - Never What is the last grade level you completed in school?: 12th grade  Interpreter Needed?: No  Comments: pt lives with spouse Information entered by :: LPinson, LPN  Past Medical History:  Diagnosis Date  . Anesthesia complication    prolonged sedation after colonoscopy  . Aphthous ulcer  01/13/2011  . ATOPIC RHINITIS 01/26/2007   Qualifier: Diagnosis of  By: Maxie Better FNP, Rosalita Levan   . Back pain 02/15/2011  . BPV (benign positional vertigo) 04/21/2016  . CERUMEN IMPACTION, LEFT 02/12/2009   Qualifier: Diagnosis of  By: Council Mechanic MD, Hilaria Ota   . Colon polyps    on colonoscopy 08/2010  . Conjunctivitis 01/28/2014  . ED (erectile dysfunction)   . Essential hypertension 06/25/2008   Qualifier: Diagnosis of  By: Council Mechanic MD, Hilaria Ota   . ETD (eustachian tube dysfunction) 01/18/2013  . EXTERNAL HEMORRHOIDS 08/27/2009   Qualifier: Diagnosis of  By: Deborra Medina MD, Tanja Port    . GERD (gastroesophageal reflux disease)   . History of CT scan of chest 11/2004   negative  . HYPERLIPIDEMIA 07/24/2008   Qualifier: Diagnosis of  By: Council Mechanic MD, Hilaria Ota   . Hypersomnia 01/05/2017  . Left shoulder pain 03/14/2017  . Near syncope 03/14/2017  . Neck pain 12/15/2011  . OSA (obstructive sleep apnea) 11/13/2016   Dr. Elsworth Soho  . Prostate cancer [facility])    s/p prostatectomy  . PROSTATITIS, 01/26/2007   Qualifier: Diagnosis of  By: Maxie Better FNP, Rosalita Levan   . Rash and nonspecific skin eruption 02/25/2016  . Rhinitis 08/04/2014  . Sebaceous cyst 02/13/2015  . Thoracic ascending aortic aneurysm ([place]) 04/17/2017   Chest CTA 10/18: Ascending thoracic aneurysm 4.1 cm, aortic root 4.5 cm >> follow-up 1 year // Echo 8/18:  - Ascending aorta: The ascending aorta was moderately dilated, 4.3 cm. Aortic  arch is 3.3 cm  . Tinea 03/04/2016   Past Surgical History:  Procedure Laterality Date  . ESOPHAGOGASTRODUODENOSCOPY  10/02/2000   dilated reflux esopsh hh  . [facility]: Dehydration; vasvagal sync  12/09/ 05/2003  . PLANTAR FASCIA SURGERY Bilateral   . ROBOT ASSISTED LAPAROSCOPIC RADICAL PROSTATECTOMY N/A 05/27/2013   Procedure: ROBOTIC ASSISTED LAPAROSCOPIC RADICAL PROSTATECTOMY LEVEL 1;  Surgeon: Dutch Gray, MD;  Location: [facility];  Service: Urology;  Laterality: N/A;   Family History  Problem Relation Age of  Onset  . Stroke Mother   . Prostate cancer Neg Hx   . Colon cancer Neg Hx   . Aortic dissection Neg Hx    Social History   Socioeconomic History  . Marital status: Married    Spouse name: Not on file  . Number of children: Not on file  . Years of education: Not on file  . Highest education level: Not on file  Occupational History  . 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 Financial resource strain: Not on file  . Food insecurity:    Worry: Not on file    Inability: Not on file  . Transportation needs:    Medical: Not on file    Non-medical: Not on file  Tobacco Use  . Smoking status: Never Smoker  . Smokeless tobacco: Never Used  Substance and Sexual Activity  . Alcohol use: No    Alcohol/week: 0.0 oz    Comment: no  . Drug use: No  . Sexual activity: Not on file  Lifestyle  . Physical activity:    Days per week: Not on file    Minutes per session: Not on file  . Stress: Not on file  Relationships  . Social connections:    Talks on phone: Not on file    Gets together: Not on file    Attends religious service: Not on file    Active member of club or organization: Not on file    Attends meetings of clubs or organizations: Not on file    Relationship status: Not on file  Other Topics Concern  . Not on file  Social History Narrative   Retired [employer], parttime bailiff- laid off as of 2018   Married 1976, lives with wife. 1 step daughter, 2 daughters and 1 son.   [place] [employer] fan    Outpatient Encounter Medications as of 11/30/2017  Medication Sig  . cholecalciferol (VITAMIN D) 1000 units tablet Take 1 tablet (1,000 Units total) by mouth daily.  Earney Navy Bicarbonate (ZEGERID OTC) 20-1100 MG CAPS Take 1 capsule by mouth daily before breakfast.  . losartan (COZAAR) 25 MG tablet Take 1 tablet (25 mg total) by mouth daily.   No facility-administered encounter medications on file as of 11/30/2017.      Activities of Daily Living In your present state of health, do you have any difficulty performing the following activities: 11/30/2017  Hearing? N  Vision? N  Difficulty concentrating or making decisions? N  Walking or climbing stairs? N  Dressing or bathing? N  Doing errands, shopping? N  Preparing Food and eating ? N  Using the Toilet? N  In the past six months, have you accidently leaked urine? N  Do you have problems with loss of bowel control? N  Managing your Medications? N  Managing your Finances? N  Housekeeping or managing your Housekeeping? N  Some recent data might be hidden  Patient Care Team: Tonia Ghent, MD as PCP - General (Family Medicine)   Assessment:   This is a routine wellness examination for Brylen.   Hearing Screening   125Hz  250Hz  500Hz  1000Hz  2000Hz  3000Hz  4000Hz  6000Hz  8000Hz   Right ear:   0 0 40  0    Left ear:   40 40 40  0      Visual Acuity Screening   Right eye Left eye Both eyes  Without correction:     With correction: 20/25-1 20/25 20/25  Comments: Last vision exam in Feb 2018 with Dr. Katy Fitch  Exercise Activities and Dietary recommendations Current Exercise Habits: The patient does not participate in regular exercise at present, Exercise limited by: None identified  Goals    . Follow up with Primary Care Provider     Starting 11/30/2017, I will continue to take medications as prescribed and to keep appointments with PCP as scheduled.        Fall Risk Fall Risk  11/30/2017 11/25/2016 08/27/2015  Falls in the past year? No No Yes  Number falls in past yr: - - 1  Injury with Fall? - - Yes   Depression Screen PHQ 2/9 Scores 11/30/2017 11/25/2016 08/27/2015  PHQ - 2 Score 0 0 0  PHQ- 9 Score 0 - -    Cognitive Function MMSE - Mini Mental State Exam 11/30/2017 11/25/2016  Orientation to time 5 5  Orientation to Place 5 5  Registration 3 3  Attention/ Calculation 0 0  Recall 2 3  Recall-comments unable to recall 1 of 3 words -   Language- name 2 objects 0 0  Language- repeat 1 1  Language- follow 3 step command 3 3  Language- read & follow direction 0 0  Write a sentence 0 0  Copy design 0 0  Total score 19 20     PLEASE NOTE: A Mini-Cog screen was completed. Maximum score is 20. A value of 0 denotes this part of Folstein MMSE was not completed or the patient failed this part of the Mini-Cog screening.   Mini-Cog Screening Orientation to Time - Max 5 pts Orientation to Place - Max 5 pts Registration - Max 3 pts Recall - Max 3 pts Language Repeat - Max 1 pts Language Follow 3 Step Command - Max 3 pts     Immunization History  Administered Date(s) Administered  . Influenza Split 06/08/2011  . Influenza, High Dose Seasonal PF 06/02/2017  . Influenza,inj,Quad PF,6+ Mos 04/20/2016  . Pneumococcal Conjugate-13 08/27/2015  . Pneumococcal Polysaccharide-23 12/01/2016  . Td 06/25/2008  . Zoster 07/05/2010    Screening Tests Health Maintenance  Topic Date Due  . COLONOSCOPY  12/13/2018 (Originally 08/17/2015)  . INFLUENZA VACCINE  03/15/2018  . TETANUS/TDAP  06/25/2018  . Hepatitis C Screening  Completed  . PNA vac Low Risk Adult  Completed       Plan:     I have personally reviewed, addressed, and noted the following in the patient's chart:  A. Medical and social history B. Use of alcohol, tobacco or illicit drugs  C. Current medications and supplements D. Functional ability and status E.  Nutritional status F.  Physical activity G. Advance directives H. List of other physicians I.  Hospitalizations, surgeries, and ER visits in previous 12 months J.  [place] to include hearing, vision, cognitive, depression L. Referrals and appointments - none  In addition, I have reviewed and discussed with patient certain preventive protocols, quality metrics, and  best practice recommendations. A written personalized care plan for preventive services as well as general preventive health  recommendations were provided to patient.  See attached scanned questionnaire for additional information.   Signed,   Lindell Noe, MHA, BS, LPN Health Coach

## 2017-12-08 ENCOUNTER — Encounter: Payer: Self-pay | Admitting: Family Medicine

## 2017-12-08 ENCOUNTER — Ambulatory Visit (INDEPENDENT_AMBULATORY_CARE_PROVIDER_SITE_OTHER): Payer: Medicare Other | Admitting: Family Medicine

## 2017-12-08 VITALS — BP 124/88 | HR 74 | Temp 98.9°F | Ht 68.25 in | Wt 200.8 lb

## 2017-12-08 DIAGNOSIS — C61 Malignant neoplasm of prostate: Secondary | ICD-10-CM

## 2017-12-08 DIAGNOSIS — Z7189 Other specified counseling: Secondary | ICD-10-CM

## 2017-12-08 DIAGNOSIS — I739 Peripheral vascular disease, unspecified: Secondary | ICD-10-CM

## 2017-12-08 DIAGNOSIS — G4733 Obstructive sleep apnea (adult) (pediatric): Secondary | ICD-10-CM | POA: Diagnosis not present

## 2017-12-08 DIAGNOSIS — Z0000 Encounter for general adult medical examination without abnormal findings: Secondary | ICD-10-CM

## 2017-12-08 DIAGNOSIS — I10 Essential (primary) hypertension: Secondary | ICD-10-CM | POA: Diagnosis not present

## 2017-12-08 DIAGNOSIS — E559 Vitamin D deficiency, unspecified: Secondary | ICD-10-CM | POA: Diagnosis not present

## 2017-12-08 NOTE — Progress Notes (Signed)
Hypertension:    Using medication without problems or lightheadedness: yes Chest pain with exertion:no Edema:no Short of breath:no Lipids d/w pt.  LDL improved.   Plan to recheck aorta in ~05/2018.  D/w pt.   He has no claudication sx at all.  D/w pt.   OSA.  Has been using CPAP.  Compliant.  With effect.  History of prostate cancer.  Labs discussed with patient.  PSA still zero, d/w pt.    Vitamin D deficiency.  Vit D mildly low.  D/w pt.    Hearing - failed.  Declined hearing aids.  D/w pt.    Memory testing d/w pt.  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  3/3 recall today.  Neither he nor I think he has a memory problem.    Colon cancer screening.  He had post procedure troubles from anesthesia prev and he was (reasonably) asking about options other than colonoscopy.  He has h/o tubular adenomas.  Likely best option would be cologuard, d/w pt.  Ordered.    Advance directive- wife designated if patient were incapacitated  Diet and exercise d/w pt.  Encouraged walking, etc.    Meds, vitals, and allergies reviewed.   PMH and SH reviewed  ROS: Per HPI unless specifically indicated in ROS section   GEN: nad, alert and oriented HEENT: mucous membranes moist NECK: supple w/o LA CV: rrr. PULM: ctab, no inc wob ABD: soft, +bs EXT: no edema SKIN: no acute rash

## 2017-12-08 NOTE — Patient Instructions (Addendum)
Gradually return to exercise.  Recheck aorta in the fall.  You should get a call about this by the end of September.  If not, then let me know.  Don't change your meds for now.  Take care.  Glad to see you.

## 2017-12-10 DIAGNOSIS — E559 Vitamin D deficiency, unspecified: Secondary | ICD-10-CM | POA: Insufficient documentation

## 2017-12-10 NOTE — Assessment & Plan Note (Signed)
Lipids d/w pt.  LDL improved.   Plan to recheck aorta in ~05/2018.  D/w pt.   He has no claudication sx at all.  D/w pt.  Continue as is.  He agrees.  Labs discussed with patient. >25 minutes spent in face to face time with patient, >50% spent in counselling or coordination of care, discussing follow-up about his aorta, previous claudication, labs, etc.

## 2017-12-10 NOTE — Assessment & Plan Note (Signed)
Continue cpap, complaint, with effect.

## 2017-12-10 NOTE — Assessment & Plan Note (Signed)
Advance directive- wife designated if patient were incapacitated.  

## 2017-12-10 NOTE — Assessment & Plan Note (Signed)
Hearing - failed.  Declined hearing aids.  D/w pt.    Memory testing d/w pt.  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  3/3 recall today.  Neither he nor I think he has a memory problem.    Colon cancer screening.  He had post procedure troubles from anesthesia prev and he was (reasonably) asking about options other than colonoscopy.  He has h/o tubular adenomas.  Likely best option would be cologuard, d/w pt.  Ordered.    Advance directive- wife designated if patient were incapacitated  Diet and exercise d/w pt.  Encouraged walking, etc.

## 2017-12-10 NOTE — Assessment & Plan Note (Signed)
PSA still 0.  Discussed with patient.

## 2017-12-10 NOTE — Assessment & Plan Note (Signed)
Vitamin D slightly low.  Continue replacement as is as this should likely get him to a suitable level.  Discussed.  He agrees.

## 2017-12-10 NOTE — Assessment & Plan Note (Signed)
Resolved

## 2018-01-12 ENCOUNTER — Encounter: Payer: Self-pay | Admitting: Family Medicine

## 2018-01-12 ENCOUNTER — Telehealth: Payer: Self-pay | Admitting: *Deleted

## 2018-01-12 ENCOUNTER — Ambulatory Visit (INDEPENDENT_AMBULATORY_CARE_PROVIDER_SITE_OTHER): Payer: Medicare Other | Admitting: Family Medicine

## 2018-01-12 VITALS — BP 126/88 | HR 69 | Temp 97.8°F | Ht 68.25 in | Wt 205.1 lb

## 2018-01-12 DIAGNOSIS — R1032 Left lower quadrant pain: Secondary | ICD-10-CM | POA: Diagnosis not present

## 2018-01-12 DIAGNOSIS — Z1211 Encounter for screening for malignant neoplasm of colon: Secondary | ICD-10-CM

## 2018-01-12 NOTE — Telephone Encounter (Signed)
LMOVM for pt to RTC concerning lab results/PEC ok to give results that the Cologuard test was positive and Dr. Damita Dunnings is referring to a GI/thx dmf

## 2018-01-12 NOTE — Progress Notes (Signed)
Intermittent left sided groin pain.  Pain has been intermittent for several months was more noticeable since Monday but better today.  Pain is not effected by movement.  Can radiate into the L testicle. No known mass or bulge.  No pain with cough or laugh or sneeze. He is doing more lifting at work.  No pain with lifting.  No R sided sx.  No FCNAVD.  No dysuria.  No rash.    Meds, vitals, and allergies reviewed.   ROS: Per HPI unless specifically indicated in ROS section   nad ncat rrr ctab abd soft, not ttp Testes bilaterally descended without nodularity, tenderness or masses. No scrotal masses or lesions except for L cord is thickened, with possible varicocele.  No penis lesions or urethral discharge.   No hernia noted.

## 2018-01-12 NOTE — Patient Instructions (Signed)
We will call about your referral.  Rosaria Ferries or Azalee Course will call you if you don't see one of them on the way out.  Take care.  Glad to see you.  Update me as needed.

## 2018-01-12 NOTE — Telephone Encounter (Signed)
Copied from [place] [phone]. Topic: Inquiry >> Jan 11, 2018 11:00 AM Oliver Pila B wrote: Reason for CRM: [facility] called and asked about if the practice has received a fax for a colon guard test; the pt has a positive test, if not have received contact [phone]

## 2018-01-12 NOTE — Telephone Encounter (Signed)
Pt aware of results and agrees with plan/thx dmf

## 2018-01-12 NOTE — Telephone Encounter (Signed)
Patient calling back for results. Please advise. CB#: [phone]

## 2018-01-12 NOTE — Telephone Encounter (Signed)
Noted.  Results reviewed.  Will scan.  Please call pt.  Refer to GI, ordered.  Thanks.

## 2018-01-14 DIAGNOSIS — R1032 Left lower quadrant pain: Secondary | ICD-10-CM | POA: Insufficient documentation

## 2018-01-14 NOTE — Assessment & Plan Note (Signed)
No hernia noted.  He could have a possible left varicocele.  Discussed with patient about options.  Check outpatient nonemergent testicular ultrasound and we will go from there.  Okay for outpatient follow-up.  He agrees.

## 2018-01-17 ENCOUNTER — Encounter: Payer: Self-pay | Admitting: Gastroenterology

## 2018-01-18 ENCOUNTER — Ambulatory Visit
Admission: RE | Admit: 2018-01-18 | Discharge: 2018-01-18 | Disposition: A | Discharge status: Home / Self Care | Payer: Medicare Other | Source: Ambulatory Visit | Attending: Family Medicine | Admitting: Family Medicine

## 2018-01-18 DIAGNOSIS — I861 Scrotal varices: Secondary | ICD-10-CM | POA: Diagnosis not present

## 2018-01-18 DIAGNOSIS — N50812 Left testicular pain: Secondary | ICD-10-CM | POA: Diagnosis not present

## 2018-01-18 DIAGNOSIS — R1032 Left lower quadrant pain: Secondary | ICD-10-CM | POA: Diagnosis present

## 2018-01-18 IMAGING — US US SCROTUM W/ DOPPLER COMPLETE
1 series · 14 of 25 positions shown · non-contrast
Comparison: None.

CLINICAL DATA: Initial evaluation for intermittent left groin pain
for 1 year.

EXAM:
SCROTAL ULTRASOUND
DOPPLER ULTRASOUND OF THE TESTICLES
TECHNIQUE: Complete ultrasound examination of the testicles, epididymis, and
other scrotal structures was performed. Color and spectral Doppler
ultrasound were also utilized to evaluate blood flow to the
testicles.

[Series 1: us scrotum w/ doppler complete · 14 of 60 slices shown]
[im 1/60]
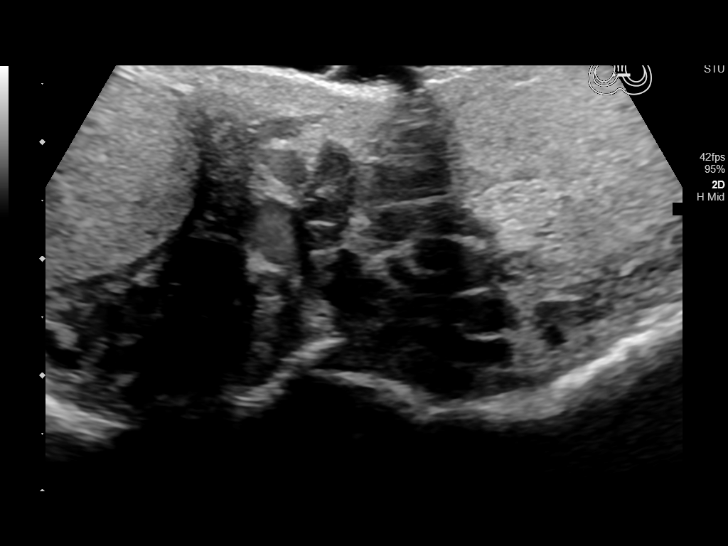
[im 5/60]
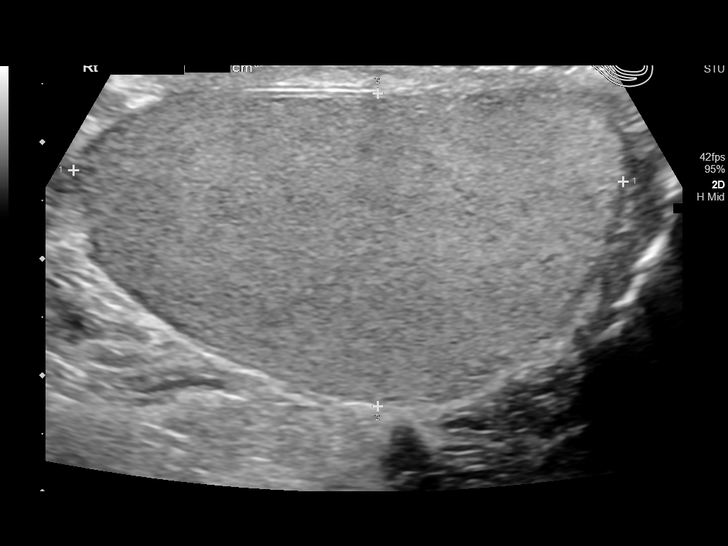
[im 10/60]
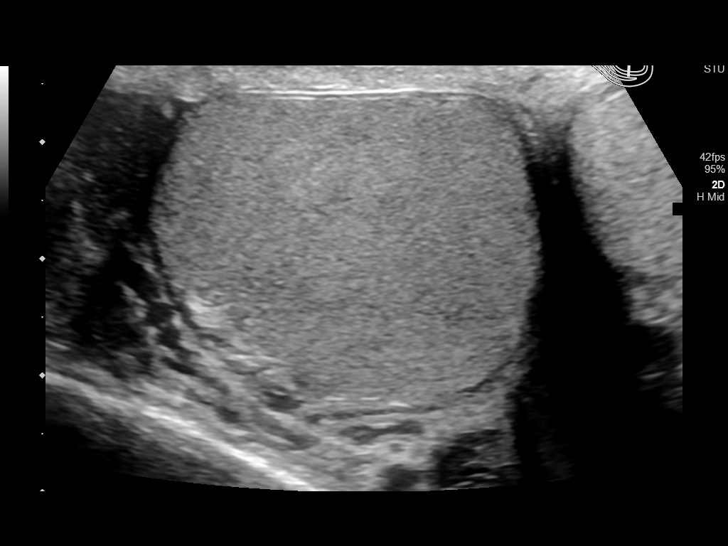
[im 15/60]
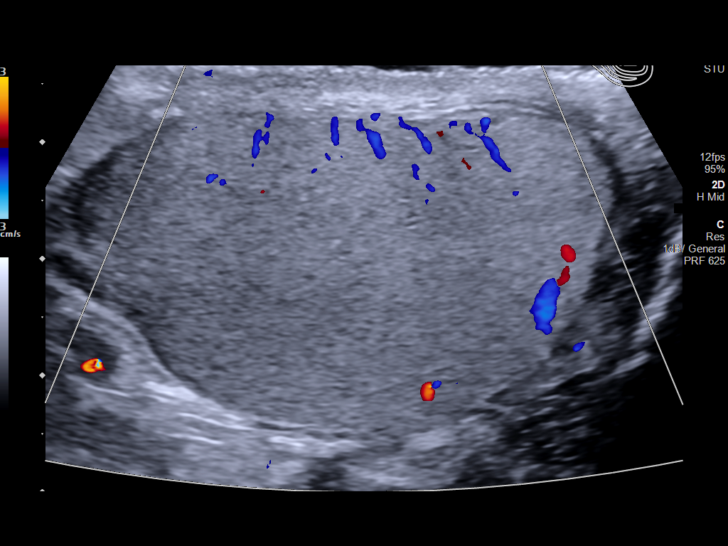
[im 20/60]
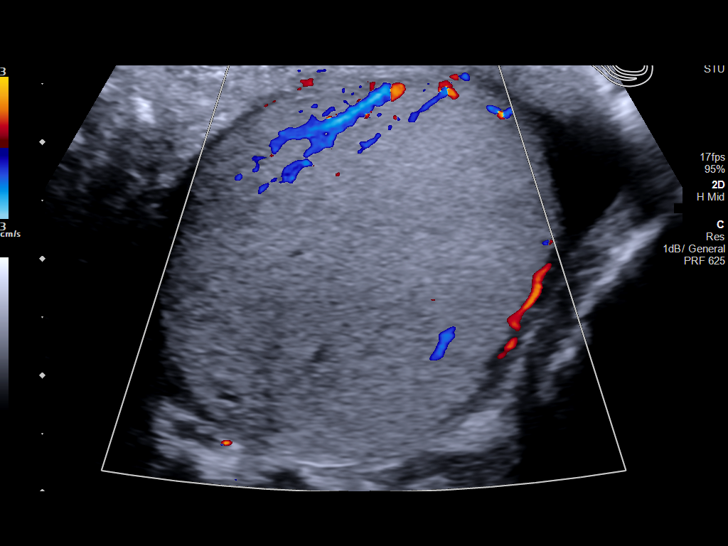
[im 23/60]
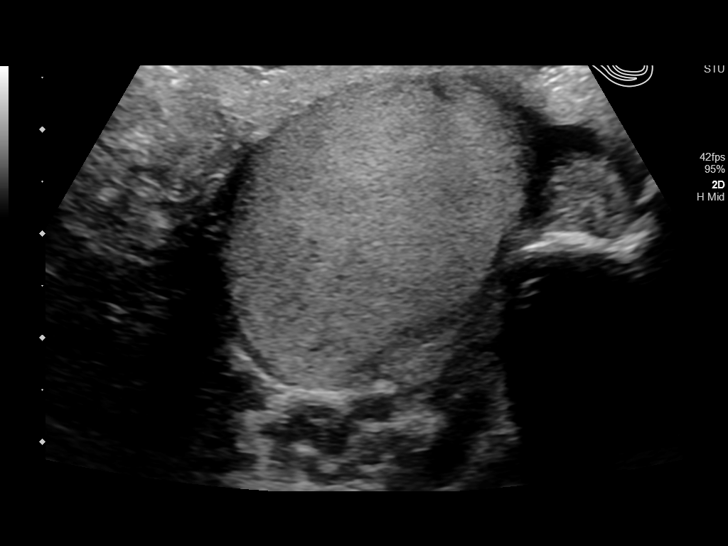
[im 28/60]
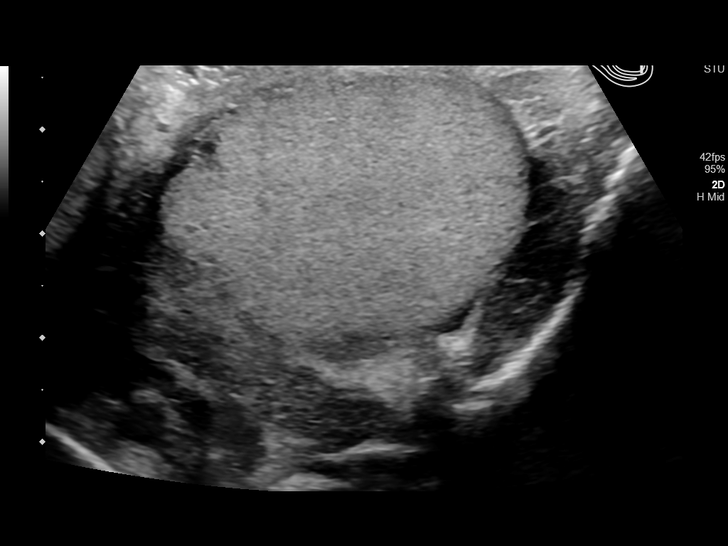
[im 32/60]
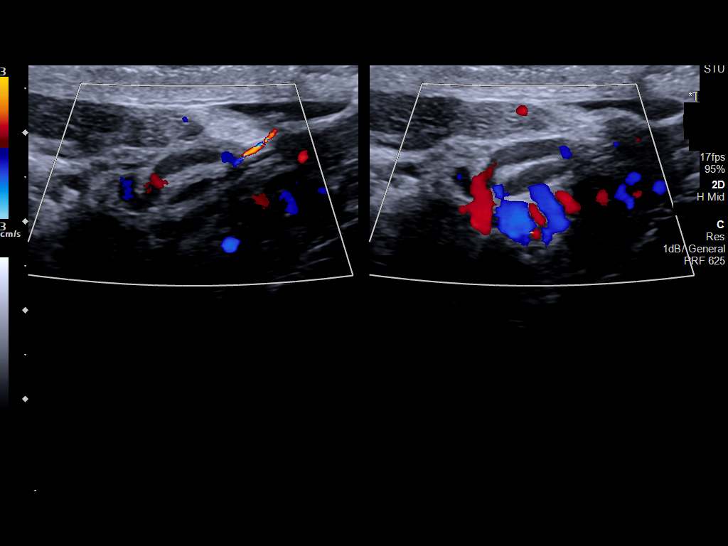
[im 37/60]
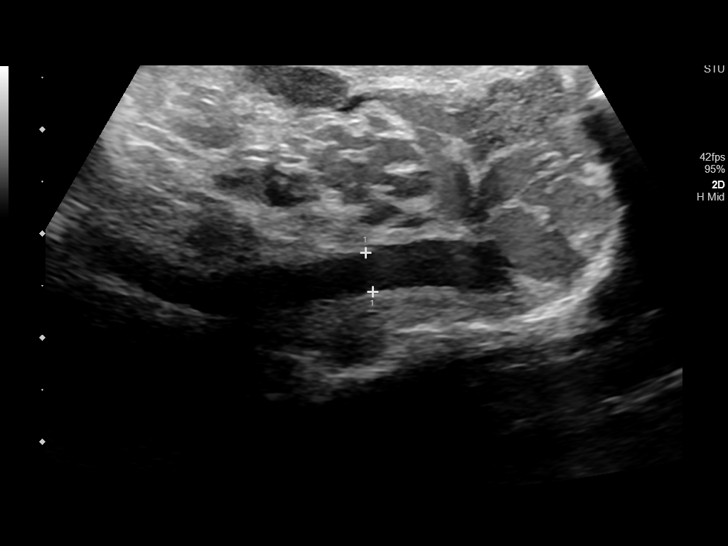
[im 40/60]
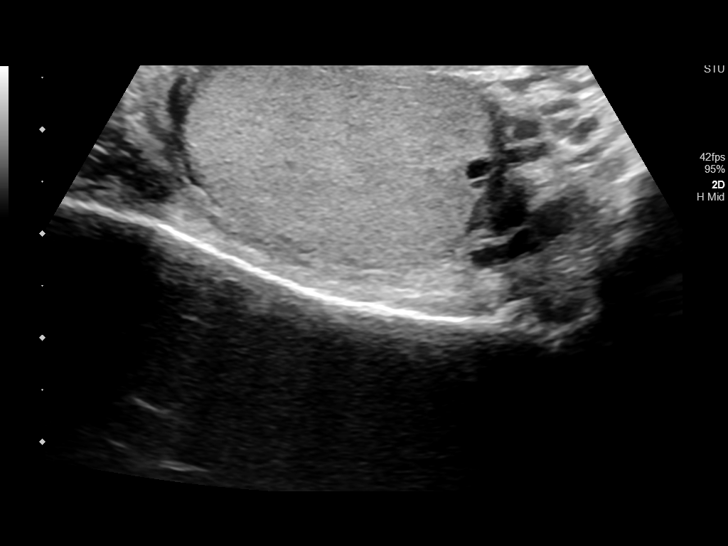
[im 45/60]
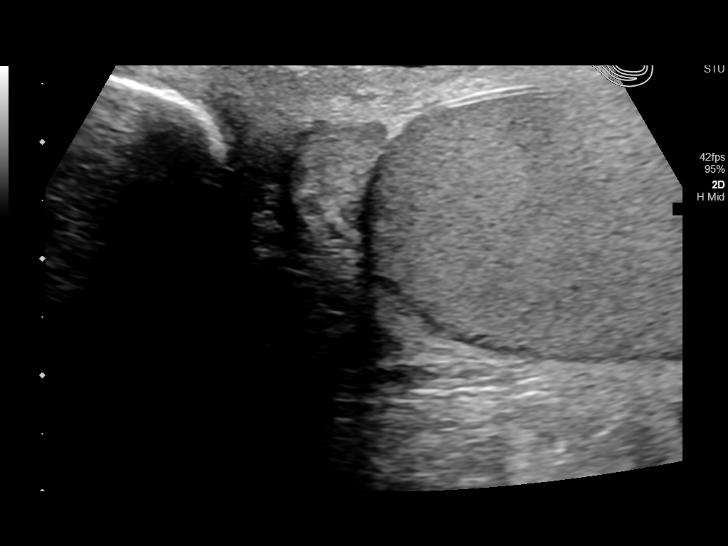
[im 50/60]
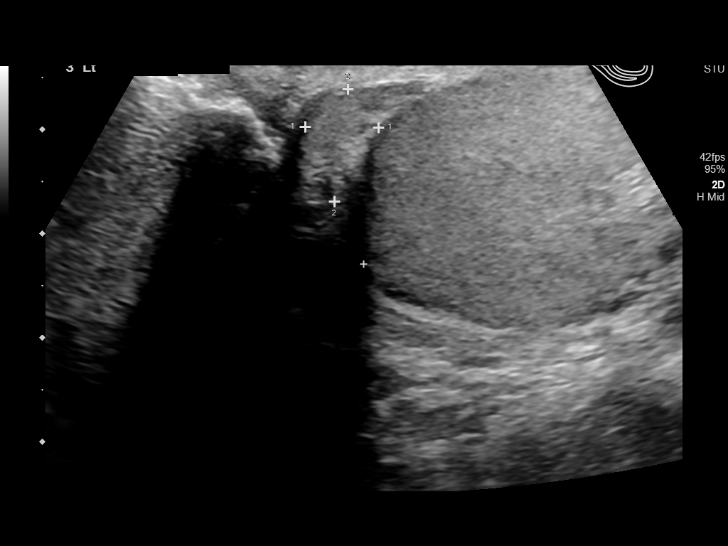
[im 55/60]
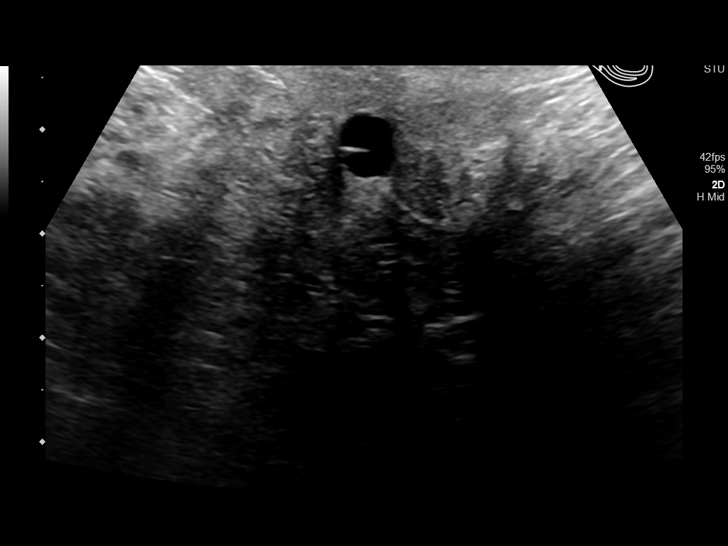
[im 60/60]
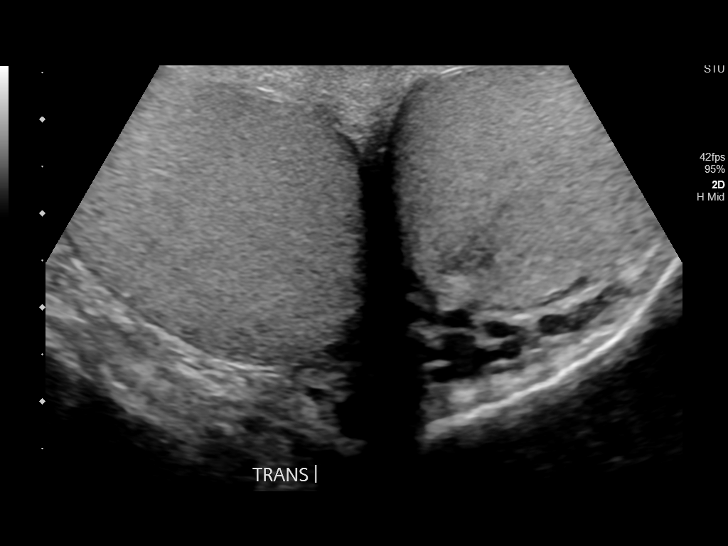

[14 of 25 positions shown; findings below may reference images not displayed]

FINDINGS: Right testicle

Measurements: 14.7 x 2.7 x 3.4 cm. No mass or microlithiasis
visualized.

Left testicle

Measurements: 4.2 x 2.8 x 3.7 cm. No mass or microlithiasis
visualized.

Right epididymis:  Normal in size and appearance.

Left epididymis: Normal in size and appearance. 5 x 6 x 5 mm simple
cyst, likely inconsequential and of doubtful significance.

Hydrocele:  None visualized.

Varicocele:  Small left-sided varicocele.

Pulsed Doppler interrogation of both testes demonstrates normal low
resistance arterial and venous waveforms bilaterally.
IMPRESSION: 1. Small left-sided varicocele, which could result in intermittent
left groin pain.
2. Otherwise negative scrotal ultrasound. No other acute abnormality
identified.

## 2018-02-05 ENCOUNTER — Telehealth: Payer: Self-pay | Admitting: Physician Assistant

## 2018-02-05 MED ORDER — NITROGLYCERIN 0.4 MG SL SUBL: 0.4000 mg | SUBLINGUAL_TABLET | SUBLINGUAL | 0 refills | Status: DC | PRN

## 2018-02-05 NOTE — Telephone Encounter (Signed)
Pt calls today with c/o worsening CP of the last few weeks. He states it happens mainly in the AM accompanied with some burping. He denies SOB and diaphoresis. Pt is to establish care with Dr End with in the year. Appt has been set up for Friday June 26th. In the meantime, pt has been instructed as to when he should seek ED care. SL Nitro tabs have been sent in per DOD Dr Acie Fredrickson. Pt has been given instruction as to how they should be used and when he should seek further care. Pt agrees to plan and has no additional questions.

## 2018-02-05 NOTE — Telephone Encounter (Signed)
New message:      Pt c/o of Chest Pain: STAT if CP now or developed within 24 hours  1. Are you having CP right now? yes  2. Are you experiencing any other symptoms (ex. SOB, nausea, vomiting, sweating)? No  3. How long have you been experiencing CP? 1 week now  4. Is your CP continuous or coming and going? Coming and going  5. Have you taken Nitroglycerin? No ?

## 2018-02-09 ENCOUNTER — Encounter: Payer: Self-pay | Admitting: Internal Medicine

## 2018-02-09 ENCOUNTER — Ambulatory Visit (INDEPENDENT_AMBULATORY_CARE_PROVIDER_SITE_OTHER): Payer: Medicare Other | Admitting: Internal Medicine

## 2018-02-09 ENCOUNTER — Ambulatory Visit (INDEPENDENT_AMBULATORY_CARE_PROVIDER_SITE_OTHER)
Admission: RE | Admit: 2018-02-09 | Discharge: 2018-02-09 | Disposition: A | Discharge status: Home / Self Care | Payer: Medicare Other | Source: Ambulatory Visit | Attending: Internal Medicine | Admitting: Internal Medicine

## 2018-02-09 VITALS — BP 138/82 | HR 68 | Ht 68.25 in | Wt 206.5 lb

## 2018-02-09 DIAGNOSIS — E785 Hyperlipidemia, unspecified: Secondary | ICD-10-CM | POA: Diagnosis not present

## 2018-02-09 DIAGNOSIS — I712 Thoracic aortic aneurysm, without rupture, unspecified: Secondary | ICD-10-CM

## 2018-02-09 DIAGNOSIS — I10 Essential (primary) hypertension: Secondary | ICD-10-CM | POA: Diagnosis not present

## 2018-02-09 DIAGNOSIS — R0789 Other chest pain: Secondary | ICD-10-CM | POA: Diagnosis not present

## 2018-02-09 DIAGNOSIS — R079 Chest pain, unspecified: Secondary | ICD-10-CM | POA: Insufficient documentation

## 2018-02-09 DIAGNOSIS — Z01812 Encounter for preprocedural laboratory examination: Secondary | ICD-10-CM

## 2018-02-09 LAB — BASIC METABOLIC PANEL
BUN / CREAT RATIO: 14 (ref 10–24)
BUN: 19 mg/dL (ref 8–27)
CHLORIDE: 103 mmol/L (ref 96–106)
CO2: 26 mmol/L (ref 20–29)
Calcium: 9.7 mg/dL (ref 8.6–10.2)
Creatinine, Ser: 1.34 mg/dL — ABNORMAL HIGH (ref 0.76–1.27)
GFR calc Af Amer: 62 mL/min/{1.73_m2} (ref >59)
GFR, EST NON AFRICAN AMERICAN: 54 mL/min/{1.73_m2} — AB (ref >59)
Glucose: 100 mg/dL — ABNORMAL HIGH (ref 65–99)
POTASSIUM: 4.4 mmol/L (ref 3.5–5.2)
SODIUM: 139 mmol/L (ref 134–144)

## 2018-02-09 IMAGING — CT CT ANGIO CHEST
2 of 8 series · 17 of 46 positions shown · IV contrast (iopamidol)
Comparison: Prior CTA chest [DATE]

CLINICAL DATA: 67-year-old male with a 1 week history of daily
sharp chest pain and headaches. Evaluate for aortic dissection.
Known history of thoracic aortic aneurysm without rupture.

EXAM:
CT ANGIOGRAPHY CHEST WITH CONTRAST
TECHNIQUE: Multidetector CT imaging of the chest was performed using the
standard protocol during bolus administration of intravenous
contrast. Multiplanar CT image reconstructions and MIPs were
obtained to evaluate the vascular anatomy.
CONTRAST:  100mL [OU] IOPAMIDOL ([OU]) INJECTION 76%

[Series 7: dissection 3.0 i31f 2 · axial · 0.79mm/px · z∈[-334,-25]mm · 14 of 117 slices shown]
[im 7/117  lung]
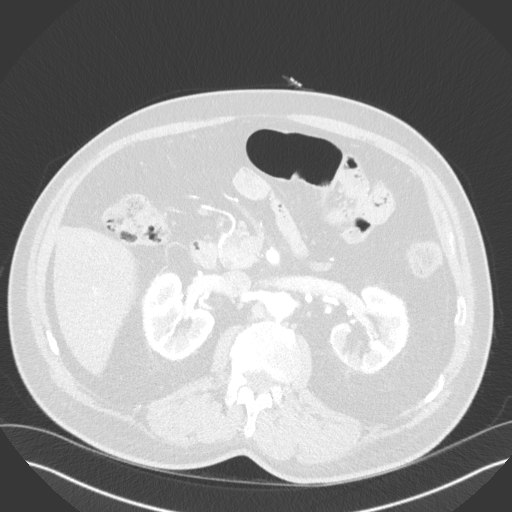
[im 14/117  soft-tissue]
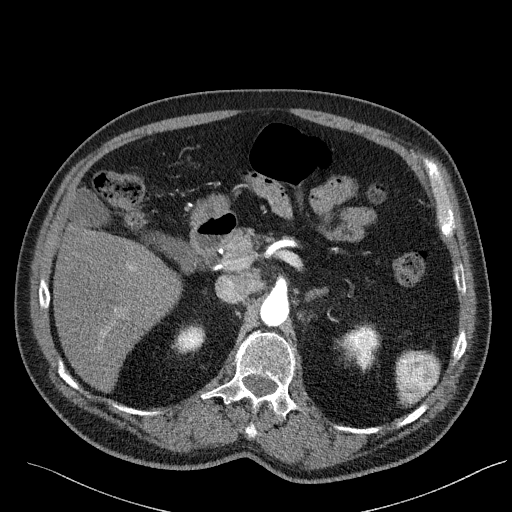
[im 21/117  lung]
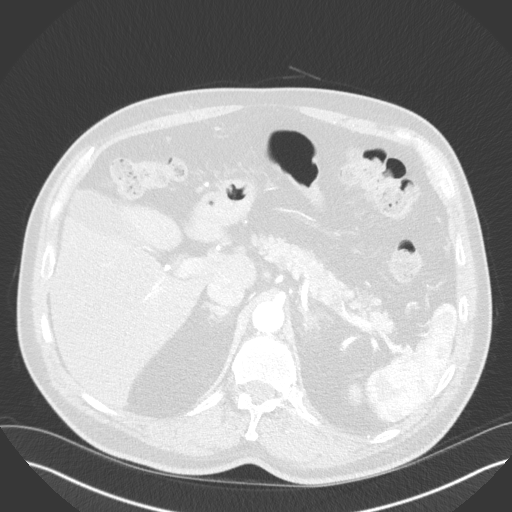
[im 35/117  soft-tissue]
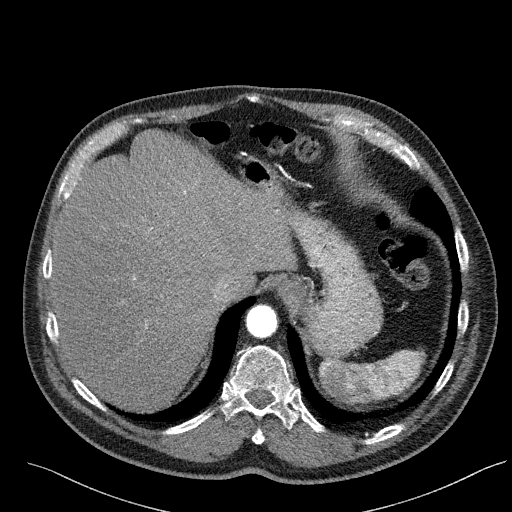
[im 41/117  lung]
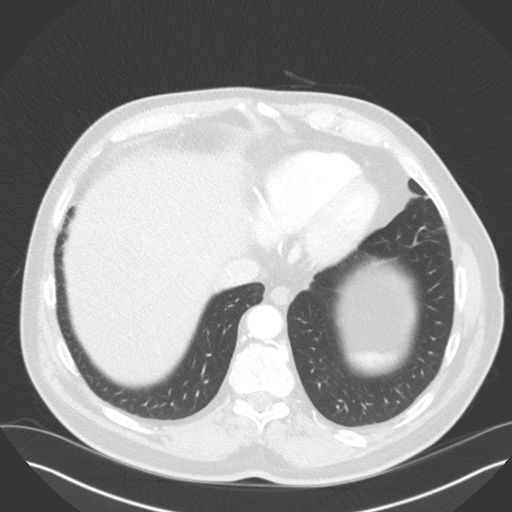
[im 48/117  soft-tissue]
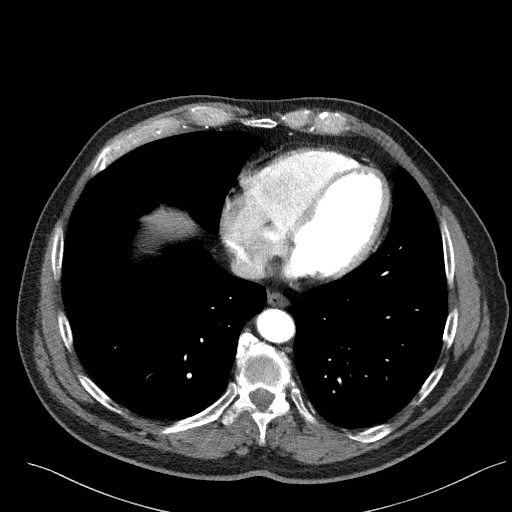
[im 55/117  lung]
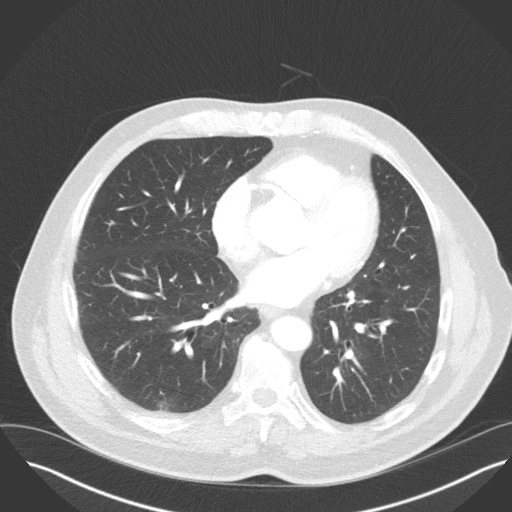
[im 62/117  soft-tissue]
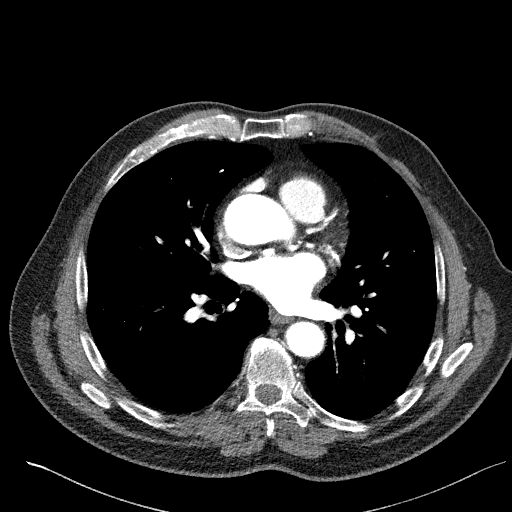
[im 69/117  lung]
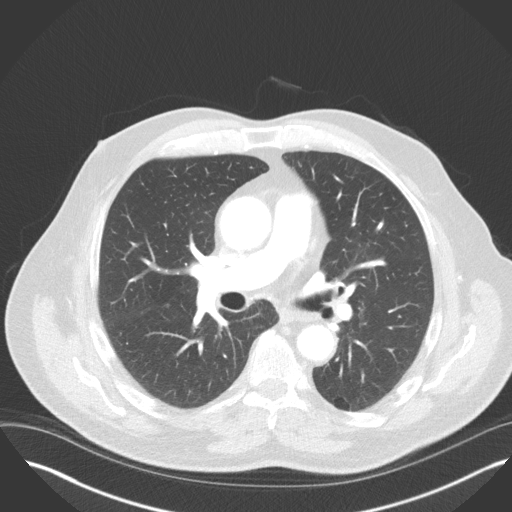
[im 76/117  soft-tissue]
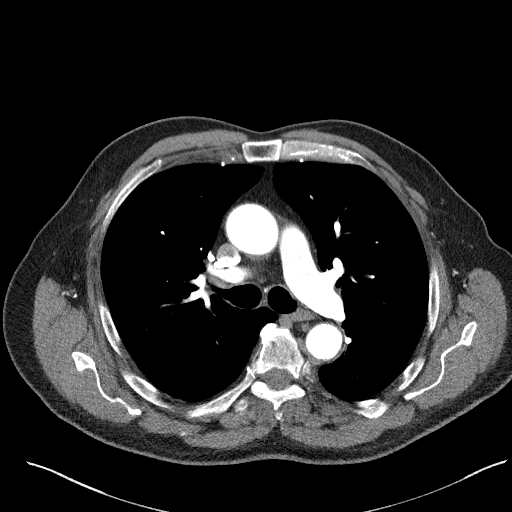
[im 89/117  lung]
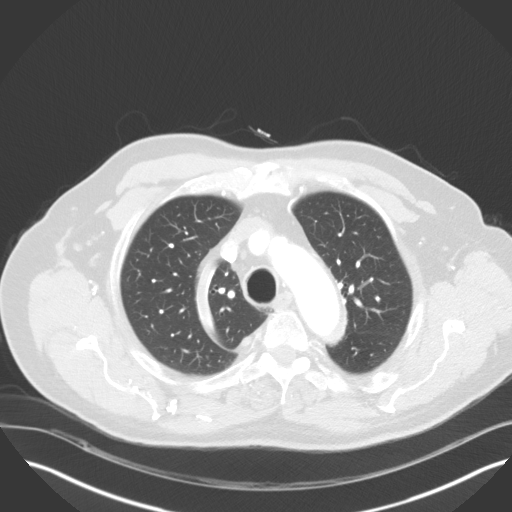
[im 96/117  soft-tissue]
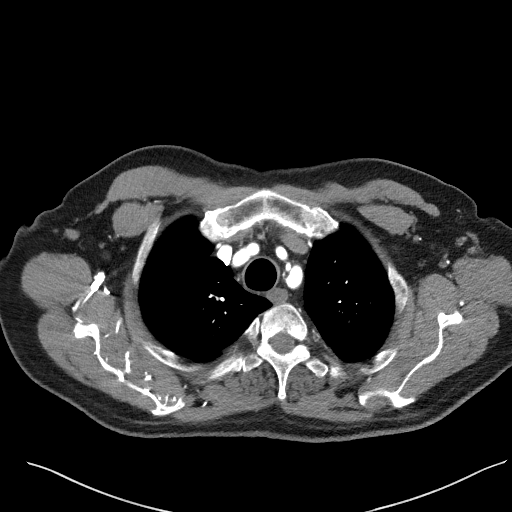
[im 103/117  lung]
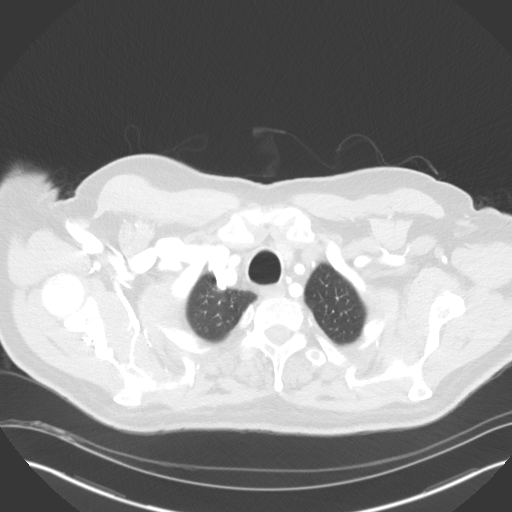
[im 110/117  soft-tissue]
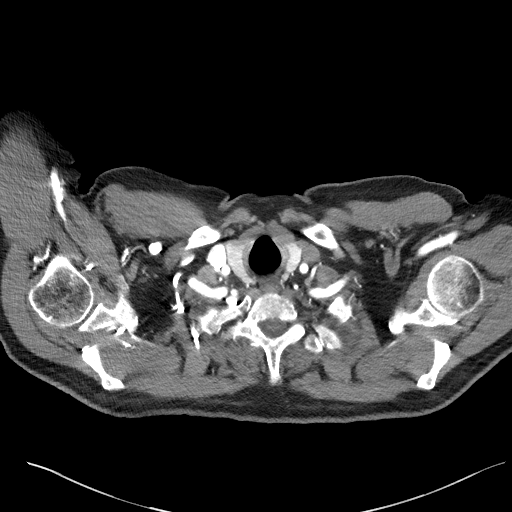

[Series 10: coronals · coronal · 0.66mm/px · 3 of 143 slices shown]
[im 36/143  soft-tissue]
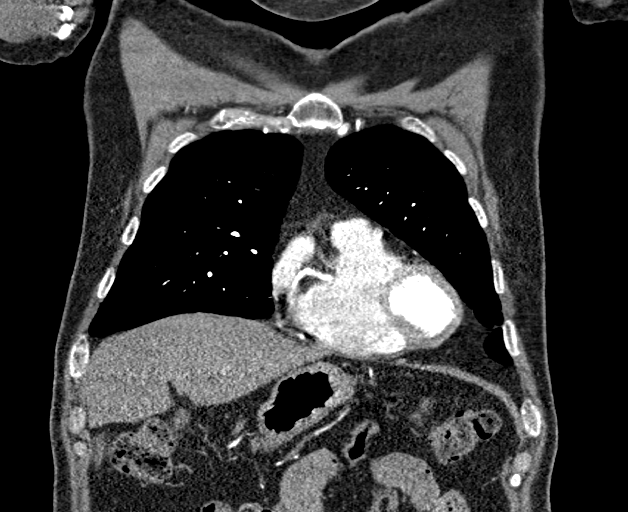
[im 72/143  soft-tissue]
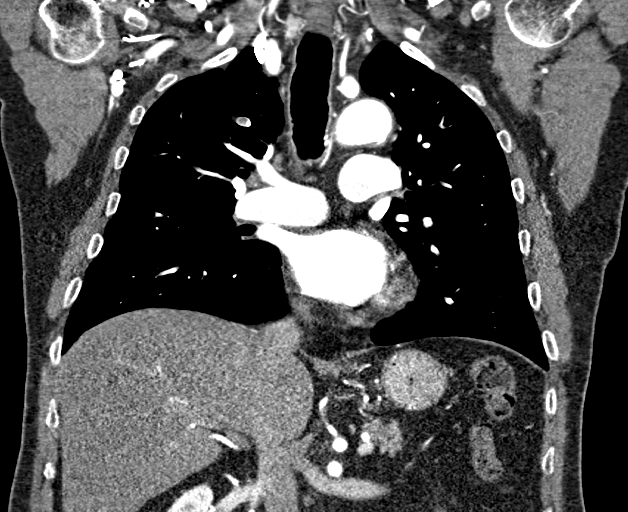
[im 107/143  soft-tissue]
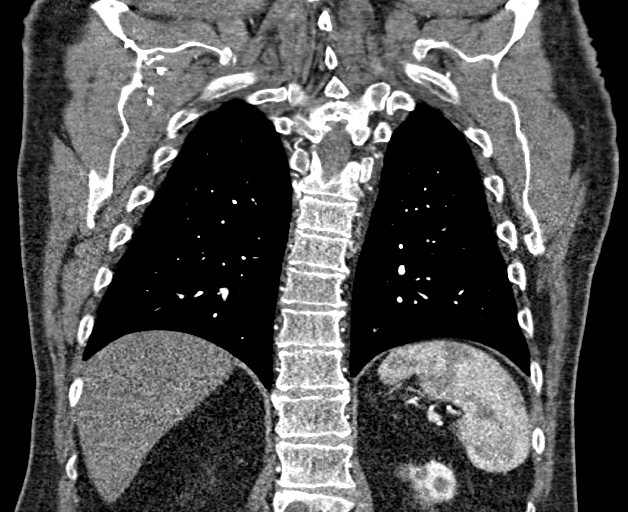

[17 of 46 positions shown; findings below may reference images not displayed]

FINDINGS: Cardiovascular: Initial noncontrast images demonstrate no evidence
of acute intramural hematoma. Following the administration of
intravenous contrast, there is excellent opacification of the
thoracic aorta. Four vessel arch anatomy. The left vertebral artery
arises directly from the aorta. Similar appearance of dilated aortic
root at 4.8 cm measured at the sinuses of Valsalva. The tubular
portion of the ascending thoracic aorta remains mildly aneurysmal at
4.2 cm, minimally changed compared to 4.1 cm previously. The main
pulmonary artery remains normal in size. Calcifications are present
along the coronary arteries. The heart is normal in size. No
pericardial effusion.

Mediastinum/Nodes: Unremarkable CT appearance of the thyroid gland.
No suspicious mediastinal or hilar adenopathy. No soft tissue
mediastinal mass. The thoracic esophagus is unremarkable.

Lungs/Pleura: Azygos fissure. Otherwise, unremarkable appearance of
the lungs. No pleural effusion or pneumothorax.

Upper Abdomen: No acute abnormality within the upper abdomen.
Incompletely imaged bilateral low-attenuation renal lesions appear
stable compared to prior imaging and likely represent benign cysts.

Musculoskeletal: No acute fracture or aggressive appearing lytic or
blastic osseous lesion.

Review of the MIP images confirms the above findings.
IMPRESSION: 1. No evidence of acute aortic or branch vessel dissection.
2. Stable to incrementally increased aneurysmal dilatation of the
tubular portion of the ascending thoracic aorta with a maximal
diameter of 4.2 cm. Recommend annual imaging followup by CTA or MRA.
This recommendation follows [OU]
ACCF/AHA/AATS/ACR/ASA/SCA/AUJLA/AUJLA/AUJLA/AUJLA Guidelines for the
Diagnosis and Management of Patients with Thoracic Aortic Disease.
Circulation. [OU]; 121: [id number]
3. Stable ectasia of the aortic root with a maximal diameter of
cm.
4. Coronary artery calcifications.

## 2018-02-09 MED ORDER — ATORVASTATIN CALCIUM 20 MG PO TABS: 20.0000 mg | ORAL_TABLET | Freq: Every day | ORAL | 3 refills | Status: DC

## 2018-02-09 MED ORDER — IOPAMIDOL (ISOVUE-370) INJECTION 76%
100.0000 mL | Freq: Once | INTRAVENOUS | Status: AC | PRN
  Administered 2018-02-09: 100 mL via INTRAVENOUS

## 2018-02-09 NOTE — Progress Notes (Unsigned)
@ 

## 2018-02-09 NOTE — Progress Notes (Signed)
Follow-up Outpatient Visit Date: 02/09/2018  Primary Care Provider: Tonia Ghent, MD [place] [address]  Chief Complaint: Chest pain  HPI:  Howard [name] is a 67 y.o. year-old male with history of hypertension and thoracic aortic aneurysm, who presents for evaluation of chest pain.  He was seen once in our office in 05/2017 as a new referral for evaluation of incidentally noted thoracic aortic aneurysm by echo.  He reported a history of what sounded like SVT several years ago, but had not seen a cardiologist for years.  CTA of the chest showed mild enlargement of the ascending aorta.  Today, Howard [name] presents for evaluation of chest pain over the last week.  He began noticing intermittent "shooting" pains in the center and right side of the chest 7-8 days ago.  The pain would last a few seconds to a minute before resolving spontaneously.  It seemed to radiate up the back of his neck to his head.  There were no other accompanying symptoms, such as shortness of breath, palpitations, lightheadedness, and diaphoresis.  The pain seemed to worsen over the next 3 days, prompting him to call the office.  A prescription for nitroglycerin was called in, though he has not needed to use it yet.  For the last 2 days, he has not had any chest pain.  He wonders if acid reflux is contributing, given that he has felt more gas.  He notes occasional skipped beats, albeit not clearly related to the aforementioned chest pain.  The symptoms are vaguely reminiscent of report SVT though not sustained.  --------------------------------------------------------------------------------------------------  Cardiovascular History & Procedures: Cardiovascular Problems:  Chest pain  Thoracic aortic aneurysm  Risk Factors:  Hypertension, hyperlipidemia, peripheral vascular disease (TAA), male gender, obesity, and age greater than 60  Cath/PCI:  None  CV Surgery:  None  EP Procedures  and Devices:  None  Non-Invasive Evaluation(s):  ABIs (08/11/2017): Right 1.24, left 1.28.  TBI's right 0.52, left 1.09.  TTE (04/14/2017): Normal LV size.  LVEF 55 to 6% with normal wall motion and grade 1 diastolic dysfunction.  Poorly visualized aortic valve.  Mildly dilated aortic root and a sending aorta up to 4.3 cm.  Mild MR.  Normal RV size and function.  Recent CV Pertinent Labs: Lab Results  Component Value Date   CHOL 196 11/30/2017   HDL 37.10 (L) 11/30/2017   LDLCALC 126 (H) 11/30/2017   LDLDIRECT 172.4 12/13/2012   TRIG 163.0 (H) 11/30/2017   CHOLHDL 5 11/30/2017   K 5.2 (H) 11/30/2017   BUN 19 11/30/2017   BUN 17 05/30/2017   CREATININE 1.30 11/30/2017    Past medical and surgical history were reviewed and updated in EPIC.  Current Meds  Medication Sig  . cholecalciferol (VITAMIN D) 1000 units tablet Take 1 tablet (1,000 Units total) by mouth daily.  Marland Kitchen losartan (COZAAR) 25 MG tablet Take 1 tablet (25 mg total) by mouth daily.  . nitroGLYCERIN (NITROSTAT) 0.4 MG SL tablet Place 1 tablet (0.4 mg total) under the tongue every 5 (five) minutes as needed for chest pain.  Marland Kitchen Omeprazole-Sodium Bicarbonate (ZEGERID OTC) 20-1100 MG CAPS Take 1 capsule by mouth daily before breakfast.    Allergies: Ibuprofen  Social History   Tobacco Use  . Smoking status: Never Smoker  . Smokeless tobacco: Never Used  Substance Use Topics  . Alcohol use: No    Alcohol/week: 0.0 oz    Comment: no  . Drug use: No  Family History  Problem Relation Age of Onset  . Stroke Mother   . Prostate cancer Neg Hx   . Colon cancer Neg Hx   . Aortic dissection Neg Hx     Review of Systems: A 12-system review of systems was performed and was negative except as noted in the HPI.  --------------------------------------------------------------------------------------------------  Physical Exam: BP 138/82   Pulse 68   Ht 5' 8.25" (1.734 m)   Wt 206 lb 8 oz (93.7 kg)   SpO2 97%    BMI 31.17 kg/m   General:  NAD.  Accompanied by his wife HEENT: No conjunctival pallor or scleral icterus. Moist mucous membranes.  OP clear. Neck: Supple without lymphadenopathy, thyromegaly, JVD, or HJR. No carotid bruit. Lungs: Normal work of breathing. Clear to auscultation bilaterally without wheezes or crackles. Heart: Regular rate and rhythm without murmurs, rubs, or gallops. Non-displaced PMI. Abd: Bowel sounds present. Soft, NT/ND without hepatosplenomegaly Ext: No lower extremity edema. Radial, PT, and DP pulses are 2+ bilaterally. Skin: Warm and dry without rash.  EKG:  NSR with PAC's.  Otherwise, no significant abnormalities.  Lab Results  Component Value Date   WBC 9.2 11/25/2016   HGB 14.8 11/25/2016   HCT 44.1 11/25/2016   MCV 88.4 11/25/2016   PLT 286.0 11/25/2016    Lab Results  Component Value Date   NA 138 11/30/2017   K 5.2 (H) 11/30/2017   CL 103 11/30/2017   CO2 28 11/30/2017   BUN 19 11/30/2017   CREATININE 1.30 11/30/2017   GLUCOSE 102 (H) 11/30/2017   ALT 12 11/30/2017    Lab Results  Component Value Date   CHOL 196 11/30/2017   HDL 37.10 (L) 11/30/2017   LDLCALC 126 (H) 11/30/2017   LDLDIRECT 172.4 12/13/2012   TRIG 163.0 (H) 11/30/2017   CHOLHDL 5 11/30/2017    --------------------------------------------------------------------------------------------------  ASSESSMENT AND PLAN: Atypical chest pain Present for >1 week and non-anginal in quality.  Given history of TAA, I think it is prudent to exclude acute aortic syndrome.  We will check a STAT BMP today and proceed with CTA chest in the office for further assessment of the aorta.  If there is no evidence of aortic dissection or significant enlargement of TAA, we will start Howard [name] on ASA 81 mg daily and proceed with exercise tolerance test.  Of note, coronary artery calcification was noted on CTA last year.  TAA Mildly enlarged ascending aorta in 05/2017.  Given new chest pain, we  will repeat CTA today.  Continue BP and lipid control.  Hyperlipidemia LDL above goal in the setting of TAA and coronary artery calcification (goal < 100).  We will discuss adding a statin after completion of the aforementioned CTA.  Hypertension Upper normal blood pressure today.  Continue losartan with plan for uptitration if BP is consistent above 140/90.  Follow-up: To be determined based on results of CTA chest.  Nelva Bush, MD 02/09/2018 1:19 PM

## 2018-02-09 NOTE — Addendum Note (Signed)
Addended by: Frederik Schmidt on: 02/09/2018 03:47 PM   Modules accepted: Orders

## 2018-02-09 NOTE — Patient Instructions (Addendum)
Medication Instructions:  START ATORVASTATIN (LIPITOR) 20 mg daily  -- If you need a refill on your cardiac medications before your next appointment, please call your pharmacy. --  Labwork: DONE BMET STAT  Testing/Procedures: DONE: CHEST CTA   NEEDS SCHEDULING: Your physician has requested that you have a lexiscan myoview. For further information please visit [email or web address]. Please follow instruction sheet, as given.   Follow-Up: 1 month with Dr End or Richardson Dopp  Thank you for choosing [facility]!!    Any Other Special Instructions Will Be Listed Below (If Applicable).

## 2018-02-13 ENCOUNTER — Telehealth (HOSPITAL_COMMUNITY): Payer: Self-pay | Admitting: *Deleted

## 2018-02-13 NOTE — Telephone Encounter (Signed)
Left message on voicemail per DPR in reference to upcoming appointment scheduled on 02/16/18 at 1015 with detailed instructions given per Myocardial Perfusion Study Information Sheet for the test. LM to arrive 15 minutes early, and that it is imperative to arrive on time for appointment to keep from having the test rescheduled. If you need to cancel or reschedule your appointment, please call the office within 24 hours of your appointment. Failure to do so may result in a cancellation of your appointment, and a $50 no show fee. Phone number given for call back for any questions. Adely Facer, Ranae Palms

## 2018-02-16 ENCOUNTER — Ambulatory Visit (HOSPITAL_COMMUNITY): Payer: Medicare Other | Attending: Internal Medicine

## 2018-02-16 DIAGNOSIS — I712 Thoracic aortic aneurysm, without rupture, unspecified: Secondary | ICD-10-CM

## 2018-02-16 DIAGNOSIS — I739 Peripheral vascular disease, unspecified: Secondary | ICD-10-CM | POA: Insufficient documentation

## 2018-02-16 DIAGNOSIS — R0789 Other chest pain: Secondary | ICD-10-CM

## 2018-02-16 DIAGNOSIS — I2510 Atherosclerotic heart disease of native coronary artery without angina pectoris: Secondary | ICD-10-CM | POA: Diagnosis not present

## 2018-02-16 DIAGNOSIS — E785 Hyperlipidemia, unspecified: Secondary | ICD-10-CM

## 2018-02-16 DIAGNOSIS — Z01812 Encounter for preprocedural laboratory examination: Secondary | ICD-10-CM | POA: Diagnosis not present

## 2018-02-16 DIAGNOSIS — R9439 Abnormal result of other cardiovascular function study: Secondary | ICD-10-CM | POA: Insufficient documentation

## 2018-02-16 DIAGNOSIS — R079 Chest pain, unspecified: Secondary | ICD-10-CM | POA: Diagnosis present

## 2018-02-16 DIAGNOSIS — I10 Essential (primary) hypertension: Secondary | ICD-10-CM | POA: Insufficient documentation

## 2018-02-16 LAB — MYOCARDIAL PERFUSION IMAGING
CHL CUP NUCLEAR SSS: 7
CHL CUP RESTING HR STRESS: 56 {beats}/min
LVDIAVOL: 103 mL (ref 62–150)
LVSYSVOL: 52 mL
NUC STRESS TID: 1.02
Peak HR: 136 {beats}/min
RATE: 0.4
SDS: 3
SRS: 4

## 2018-02-16 MED ORDER — REGADENOSON 0.4 MG/5ML IV SOLN
0.4000 mg | Freq: Once | INTRAVENOUS | Status: AC
  Administered 2018-02-16: 0.4 mg via INTRAVENOUS

## 2018-02-16 MED ORDER — TECHNETIUM TC 99M TETROFOSMIN IV KIT
10.1000 | PACK | Freq: Once | INTRAVENOUS | Status: AC | PRN
  Administered 2018-02-16: 10.1 via INTRAVENOUS
  Filled 2018-02-16: qty 11

## 2018-02-16 MED ORDER — TECHNETIUM TC 99M TETROFOSMIN IV KIT
32.3000 | PACK | Freq: Once | INTRAVENOUS | Status: AC | PRN
  Administered 2018-02-16: 32.3 via INTRAVENOUS
  Filled 2018-02-16: qty 33

## 2018-02-20 ENCOUNTER — Telehealth: Payer: Self-pay | Admitting: Internal Medicine

## 2018-02-20 DIAGNOSIS — R0789 Other chest pain: Secondary | ICD-10-CM | POA: Insufficient documentation

## 2018-02-20 DIAGNOSIS — I480 Paroxysmal atrial fibrillation: Secondary | ICD-10-CM | POA: Insufficient documentation

## 2018-02-20 NOTE — Telephone Encounter (Signed)
Results of myocardial perfusion stress test were discussed with Howard [name].  No significant ischemia or scar was identified.  Decreased perfusion of the septum is likely artifact.  Brief episode of atrial fibrillation was noted following administration of regadenoson.  Given sporadic atypical chest pain, I wonder if symptoms could actually be due to paroxysmal atrial fibrillation.  We have agreed to obtain a 30-day event monitor for further evaluation.  I think it is reasonable for Howard [name] to begin moderate intensity exercise.  Given TAA, I have cautioned against weight lifting.  Nelva Bush, MD [facility] Pager: [phone]

## 2018-02-20 NOTE — Telephone Encounter (Signed)
Cardiac event monitor order placed for this pt and message was sent to our [facility] scheduling team to call the pt and arrange this appt.  This was ordered by Dr End.

## 2018-02-20 NOTE — Telephone Encounter (Signed)
-----   Message from Nelva Bush, MD sent at 02/20/2018 10:13 AM EDT -----  Good morning,    Would it be possible for you to help arrange a 30-day event monitor for Mr. Pilley to evaluate atypical chest pain and paroxysmal atrial fibrillation? Please let me know if any questions or concerns arise. Thanks for your help.    Gerald Stabs

## 2018-02-21 NOTE — Telephone Encounter (Signed)
From: Earnestine Mealing  Sent: 02/21/2018  3:33 PM  To: [facility] Triage  Subject: FW: schedule cardiac event monitor per Dr End   appt made on 02-22-18 at 2pm    Pts cardiac event monitor is scheduled for 02/22/18 at 2 pm.  Pt made aware of appt date and time by [facility] scheduling, as mentioned above.

## 2018-02-22 ENCOUNTER — Ambulatory Visit (INDEPENDENT_AMBULATORY_CARE_PROVIDER_SITE_OTHER): Payer: Medicare Other

## 2018-02-22 DIAGNOSIS — I480 Paroxysmal atrial fibrillation: Secondary | ICD-10-CM

## 2018-02-22 DIAGNOSIS — R0789 Other chest pain: Secondary | ICD-10-CM | POA: Diagnosis not present

## 2018-02-27 ENCOUNTER — Other Ambulatory Visit: Payer: Self-pay | Admitting: Cardiovascular Disease

## 2018-03-22 ENCOUNTER — Ambulatory Visit (AMBULATORY_SURGERY_CENTER): Payer: Self-pay

## 2018-03-22 VITALS — Ht 69.0 in | Wt 207.0 lb

## 2018-03-22 DIAGNOSIS — Z86010 Personal history of colonic polyps: Secondary | ICD-10-CM

## 2018-03-22 MED ORDER — NA SULFATE-K SULFATE-MG SULF 17.5-3.13-1.6 GM/177ML PO SOLN: 1.0000 | Freq: Once | ORAL | 0 refills | Status: AC

## 2018-03-22 NOTE — Progress Notes (Signed)
Cardiology Office Note:    Date:  03/23/2018   ID:  Montavis, Schubring 1950/10/12, MRN [id number]  PCP:  Tonia Ghent, MD  Cardiologist:  Nelva Bush, MD    Referring MD: Tonia Ghent, MD   Chief Complaint  Patient presents with  . Follow-up    Chest Pain    History of Present Illness:    Franke Menter is a 67 y.o. male with hypertension, thoracic aortic aneurysm.  CTA on 10/18 demonstrated 4.1 cm ascending thoracic aortic aneurysm.  Patient was seen by Dr. Saunders Revel in June 2019 for evaluation of chest pain.  Chest CTA demonstrated stable ascending thoracic aortic aneurysm measuring 4.2 cm.  There was no evidence of acute aorta syndrome.  Coronary artery complications were noted.  Statin therapy was initiated and stress testing was arranged.  Stress test was low risk and negative for ischemia.  However, there was a brief episode of atrial fibrillation after administration of Lexiscan.  30-day event monitor was arranged.  Results are still pending.    Mr. Chacko returns for follow up.  He is here alone.  He had not had any recurrent chest pain this week, until today.  He denies exertional chest pain.  He denies shortness of breath, syncope, dizziness, palpitations, leg swelling, paroxysmal nocturnal dyspnea.  He needs a colonoscopy soon and needs cardiac clearance.   Prior CV studies:   The following studies were reviewed today:  Nuclear stress test 02/16/2018 No significant ischemia, EF 50; paroxysmal atrial fibrillation noted after administration of Lexiscan, but spontaneously resolved.  Chest CTA 02/09/2018 IMPRESSION: 1. No evidence of acute aortic or branch vessel dissection. 2. Stable to incrementally increased aneurysmal dilatation of the tubular portion of the ascending thoracic aorta with a maximal diameter of 4.2 cm. Recommend annual imaging followup by CTA or MRA. 3. Stable ectasia of the aortic root with a maximal diameter of 4.8 cm. 4. Coronary artery  calcifications.  ABIs 08/11/2017 Normal  Chest CTA 06/01/2017 4.1 cm ascending thoracic aortic aneurysm Coronary artery calcifications noted  Echocardiogram 04/14/17 EF 55-60, normal wall motion, grade 1 diastolic dysfunction, mild to moderate aortic root dilation (4 cm), moderately dilated ascending aorta (4.3 cm, aortic arch 3.3 cm), mild MR, normal RVSF  Chest CTA 12/07/2004 Vessels are well opacified and there is no evidence for a thoracic dissection or aneurysm. The pulmonary arteries are also well opacified and there is no evidence for pulmonary embolus. There is no mediastinal, hilar or axillary adenopathy. Lung windows show no nodules, pleural effusions or infiltrates. There is minimal bibasilar atelectasis but no evidence for interstitial fibrosis. The findings on the recent chest x-ray may be related to hypoinflation. Images of the upper abdomen are unremarkable.  IMPRESSION:  No evidence for acute abnormality of the chest.   Past Medical History:  Diagnosis Date  . Anesthesia complication    prolonged sedation after colonoscopy  . Colon polyps    on colonoscopy 08/2010  . ED (erectile dysfunction)   . Essential hypertension 06/25/2008   Qualifier: Diagnosis of  By: Council Mechanic MD, Hilaria Ota   . GERD (gastroesophageal reflux disease)   . HYPERLIPIDEMIA 07/24/2008   Qualifier: Diagnosis of  By: Council Mechanic MD, Hilaria Ota   . Hypersomnia 01/05/2017  . Near syncope 03/14/2017  . Neck pain 12/15/2011  . OSA (obstructive sleep apnea) 11/13/2016   Dr. Elsworth Soho  . Prostate cancer [facility])    s/p prostatectomy  . Sleep apnea 2018  . Thoracic ascending aortic aneurysm (  [place]) 04/17/2017   Chest CTA 10/18: Ascending thoracic aneurysm 4.1 cm, aortic root 4.5 cm >> follow-up 1 year // Echo 8/18:  - Ascending aorta: The ascending aorta was moderately dilated, 4.3 cm. Aortic arch is 3.3 cm   Surgical Hx: The patient  has a past surgical history that includes Esophagogastroduodenoscopy (10/02/2000);  [facility]: Dehydration; vasvagal sync (12/09/ 05/2003); Plantar fascia surgery (Bilateral); Robot assisted laparoscopic radical prostatectomy (N/A, 05/27/2013); Colonoscopy; Polypectomy; and Upper gastrointestinal endoscopy.   Current Medications: Current Meds  Medication Sig  . atorvastatin (LIPITOR) 20 MG tablet Take 1 tablet (20 mg total) by mouth daily.  . cholecalciferol (VITAMIN D) 1000 units tablet Take 1 tablet (1,000 Units total) by mouth daily.  Marland Kitchen losartan (COZAAR) 25 MG tablet Take 1 tablet (25 mg total) by mouth daily.  . nitroGLYCERIN (NITROSTAT) 0.4 MG SL tablet Place 1 tablet (0.4 mg total) under the tongue every 5 (five) minutes as needed for chest pain.  Marland Kitchen Omeprazole-Sodium Bicarbonate (ZEGERID OTC) 20-1100 MG CAPS Take 1 capsule by mouth daily before breakfast.     Allergies:   Ibuprofen   Social History   Tobacco Use  . Smoking status: Never Smoker  . Smokeless tobacco: Never Used  Substance Use Topics  . Alcohol use: No    Alcohol/week: 0.0 standard drinks    Comment: no  . Drug use: No     Family Hx: The patient's family history includes Stroke in his mother. There is no history of Prostate cancer, Colon cancer, Aortic dissection, Esophageal cancer, Colon polyps, Rectal cancer, or Stomach cancer.  ROS:   Please see the history of present illness.    Review of Systems  Cardiovascular: Positive for chest pain.   All other systems reviewed and are negative.   EKGs/Labs/Other Test Reviewed:    EKG:  EKG is  ordered today.  The ekg ordered today demonstrates normal sinus rhythm, HR 82, normal axis, frequent PACs with compensatory pause, atrial couplet, QTc 446  Recent Labs: 11/30/2017: ALT 12 02/09/2018: BUN 19; Creatinine, Ser 1.34; Potassium 4.4; Sodium 139   Recent Lipid Panel Lab Results  Component Value Date/Time   CHOL 196 11/30/2017 10:57 AM   TRIG 163.0 (H) 11/30/2017 10:57 AM   HDL 37.10 (L) 11/30/2017 10:57 AM   CHOLHDL 5 11/30/2017 10:57 AM    LDLCALC 126 (H) 11/30/2017 10:57 AM   LDLDIRECT 172.4 12/13/2012 08:08 AM    Physical Exam:    VS:  BP 122/78 (BP Location: Right Arm, Patient Position: Sitting, Cuff Size: Large)   Pulse 61   Ht 5\' 9"  (1.753 m)   Wt 206 lb 6.4 oz (93.6 kg)   SpO2 98%   BMI 30.48 kg/m     Wt Readings from Last 3 Encounters:  03/23/18 206 lb 6.4 oz (93.6 kg)  03/22/18 207 lb (93.9 kg)  02/16/18 206 lb (93.4 kg)     Physical Exam  Constitutional: He is oriented to person, place, and time. He appears well-developed and well-nourished. No distress.  HENT:  Head: Normocephalic and atraumatic.  Eyes: No scleral icterus.  Cardiovascular: Normal rate. An irregularly irregular rhythm present.  No murmur heard. Pulmonary/Chest: Effort normal. He has no rales.  Abdominal: Soft. There is no hepatomegaly.  Musculoskeletal: He exhibits no edema.  Lymphadenopathy:    He has no cervical adenopathy.  Neurological: He is alert and oriented to person, place, and time.  Skin: Skin is warm and dry.  Psychiatric: He has a normal mood and affect.  ASSESSMENT & PLAN:    Chest pain, unspecified type Somewhat atypical symptoms.  Recent nuclear stress test was low risk.  He does have coronary calcification on chest CT.  He denies exertional chest symptoms.  He does have frequent PACs on his EKG.  Question if his symptoms may be related to PACs.  Initiate beta-blocker as outlined below.  If symptoms continue despite beta-blocker therapy, consider adjusting medications for reflux.  Premature atrial beats Of note, he began to feel chest discomfort during my exam.  I then obtained an EKG which demonstrated frequent PACs.  Question if his symptoms may be related to PACs.  -Start metoprolol succinate 25 mg daily at bedtime  -Arrange BMET, magnesium, TSH  Paroxysmal atrial fibrillation (HCC) Brief episode of atrial fibrillation noted during nuclear stress test.  Event monitor is pending.  CHADS2-VASc=3 (coronary  calcification, HTN, age x 1).  If atrial fibrillation noted, he will need anticoagulation.     Thoracic ascending aortic aneurysm (HCC) Stable measurement by recent CT.  Hyperlipidemia LDL goal <70 Continue statin.  Arrange follow-up lipids and LFTs.  Preoperative cardiovascular examination He needs a colonoscopy later this month.  His perioperative risk of major cardiac event is low at 0.4% according to the Revised Cardiac Risk Index.  He may proceed at acceptable risk.  His monitor is pending, but I do not anticipate any findings that will prevent him from proceeding.     Dispo:  Return in about 6 weeks (around 05/04/2018) for Routine Follow Up, w/ Richardson Dopp, PA-C.   Medication Adjustments/Labs and Tests Ordered: Current medicines are reviewed at length with the patient today.  Concerns regarding medicines are outlined above.  Tests Ordered: Orders Placed This Encounter  Procedures  . Basic Metabolic Panel (BMET)  . Magnesium  . TSH  . Hepatic function panel  . Lipid Profile  . EKG 12-Lead   Medication Changes: Meds ordered this encounter  Medications  . metoprolol succinate (TOPROL XL) 25 MG 24 hr tablet    Sig: Take 1 tablet (25 mg total) by mouth at bedtime.    Dispense:  90 tablet    Refill:  3    Signed, Richardson Dopp, PA-C  03/23/2018 1:19 PM    [facility] [address], [place], [place]  [id number] Phone: [phone]; Fax: [phone]

## 2018-03-22 NOTE — Progress Notes (Signed)
No egg or soy allergy known to patient  No problems with intubation. Hard to wake after colon with F/V with nausea. No diet pills per patient No home 02 use per patient  No blood thinners per patient  Pt denies issues with constipation   A fib during myocardial stress test.Holter monitor removed 03/21/2018. Pt has cardiology fu 8/9. EMMI video sent to pt's e mail. Pt declined.

## 2018-03-23 ENCOUNTER — Ambulatory Visit (INDEPENDENT_AMBULATORY_CARE_PROVIDER_SITE_OTHER): Payer: Medicare Other | Admitting: Physician Assistant

## 2018-03-23 ENCOUNTER — Encounter: Payer: Self-pay | Admitting: Physician Assistant

## 2018-03-23 ENCOUNTER — Encounter: Payer: Self-pay | Admitting: *Deleted

## 2018-03-23 ENCOUNTER — Telehealth: Payer: Self-pay | Admitting: Gastroenterology

## 2018-03-23 VITALS — BP 122/78 | HR 61 | Ht 69.0 in | Wt 206.4 lb

## 2018-03-23 DIAGNOSIS — I7121 Aneurysm of the ascending aorta, without rupture: Secondary | ICD-10-CM

## 2018-03-23 DIAGNOSIS — I712 Thoracic aortic aneurysm, without rupture: Secondary | ICD-10-CM

## 2018-03-23 DIAGNOSIS — I480 Paroxysmal atrial fibrillation: Secondary | ICD-10-CM | POA: Diagnosis not present

## 2018-03-23 DIAGNOSIS — Z01810 Encounter for preprocedural cardiovascular examination: Secondary | ICD-10-CM

## 2018-03-23 DIAGNOSIS — R079 Chest pain, unspecified: Secondary | ICD-10-CM | POA: Diagnosis not present

## 2018-03-23 DIAGNOSIS — E785 Hyperlipidemia, unspecified: Secondary | ICD-10-CM

## 2018-03-23 DIAGNOSIS — I491 Atrial premature depolarization: Secondary | ICD-10-CM

## 2018-03-23 MED ORDER — METOPROLOL SUCCINATE ER 25 MG PO TB24: 25.0000 mg | ORAL_TABLET | Freq: Every day | ORAL | 3 refills | Status: DC

## 2018-03-23 NOTE — Telephone Encounter (Signed)
Returned patient's phone call, pt wanted to inform us that his cardiologist said it was ok to proceed with colonoscopy and that a new medication, metoprolol, was added. Pt will proceed with colonoscopy scheduled 04/05/18.

## 2018-03-23 NOTE — Patient Instructions (Addendum)
  Medication Instructions:  1. START TOPROL XL 25 MG TAKE 1 TABLET AT BEDTIME; RX HAS BEEN SENT IN  Labwork: IN 2 WEEKS FASTING LIPID, LIVER PANEL, BMET, MAGNESIUM LEVEL, TSH ON 04/13/18 7:30 AM   Testing/Procedures: NONE ORDERED TODAY  Follow-Up: Richardson Dopp, [facility] ON 05/04/18 @ 9:45 AM   Any Other Special Instructions Will Be Listed Below (If Applicable).     If you need a refill on your cardiac medications before your next appointment, please call your pharmacy.

## 2018-03-28 ENCOUNTER — Telehealth: Payer: Self-pay

## 2018-03-28 NOTE — Telephone Encounter (Signed)
Spoke to patient with results/recommendations.  He verbalized understanding and thanked [place] for the call.

## 2018-03-28 NOTE — Telephone Encounter (Signed)
Follow up  ° ° °Patient is returning call in reference to lab results.  °

## 2018-03-28 NOTE — Telephone Encounter (Signed)
-----   Message from Nelva Bush, MD sent at 03/27/2018  7:44 PM EDT ----- Please let Mr. Riel know that his monitor showed frequent PAC's and brief atrial runs.  Given that these were also present in the office when he saw Richardson Dopp, with accompanying chest pain, I agree with starting metoprolol as recommended by Nicki Reaper.

## 2018-03-28 NOTE — Telephone Encounter (Signed)
Notes recorded by Frederik Schmidt, RN on 03/28/2018 at 8:10 AM EDT LPMTCB 8/14

## 2018-04-05 ENCOUNTER — Ambulatory Visit (AMBULATORY_SURGERY_CENTER): Payer: Medicare Other | Admitting: Gastroenterology

## 2018-04-05 ENCOUNTER — Encounter: Payer: Self-pay | Admitting: Gastroenterology

## 2018-04-05 VITALS — BP 109/78 | HR 63 | Temp 99.1°F | Resp 16 | Ht 69.0 in | Wt 207.0 lb

## 2018-04-05 DIAGNOSIS — Z86010 Personal history of colonic polyps: Secondary | ICD-10-CM | POA: Diagnosis present

## 2018-04-05 DIAGNOSIS — D123 Benign neoplasm of transverse colon: Secondary | ICD-10-CM

## 2018-04-05 DIAGNOSIS — D122 Benign neoplasm of ascending colon: Secondary | ICD-10-CM | POA: Diagnosis not present

## 2018-04-05 DIAGNOSIS — D125 Benign neoplasm of sigmoid colon: Secondary | ICD-10-CM | POA: Diagnosis not present

## 2018-04-05 DIAGNOSIS — D120 Benign neoplasm of cecum: Secondary | ICD-10-CM | POA: Diagnosis not present

## 2018-04-05 DIAGNOSIS — D124 Benign neoplasm of descending colon: Secondary | ICD-10-CM

## 2018-04-05 MED ORDER — SODIUM CHLORIDE 0.9 % IV SOLN: 500.0000 mL | Freq: Once | INTRAVENOUS | Status: DC

## 2018-04-05 NOTE — Op Note (Signed)
[facility] Patient Name: Howard [name] Procedure Date: 04/05/2018 2:29 PM MRN: [id number] Endoscopist: Ladene Artist , MD Age: 67 Referring MD:  Date of Birth: 1951/05/14 Gender: Male Account #: [id number] Procedure:                Colonoscopy Indications:              Surveillance: Personal history of adenomatous                            polyps on last colonoscopy > 5 years ago Medicines:                Monitored Anesthesia Care Procedure:                Pre-Anesthesia Assessment:                           - Prior to the procedure, a History and Physical                            was performed, and patient medications and                            allergies were reviewed. The patient's tolerance of                            previous anesthesia was also reviewed. The risks                            and benefits of the procedure and the sedation                            options and risks were discussed with the patient.                            All questions were answered, and informed consent                            was obtained. Prior Anticoagulants: The patient has                            taken no previous anticoagulant or antiplatelet                            agents. ASA Grade Assessment: III - A patient with                            severe systemic disease. After reviewing the risks                            and benefits, the patient was deemed in                            satisfactory condition to undergo the procedure.  After obtaining informed consent, the colonoscope                            was passed under direct vision. Throughout the                            procedure, the patient's blood pressure, pulse, and                            oxygen saturations were monitored continuously. The                            Colonoscope was introduced through the anus and                            advanced to the the  cecum, identified by                            appendiceal orifice and ileocecal valve. The                            ileocecal valve, appendiceal orifice, and rectum                            were photographed. The quality of the bowel                            preparation was adequate to identify polyps 6 mm                            and larger in size after extensive lavage and                            suctioning. The patient tolerated the procedure                            well. The colonoscopy was somewhat difficult due to                            a tortuous colon. [Solution]. Scope In: 2:35:21 PM Scope Out: 2:58:28 PM Scope Withdrawal Time: 0 hours 18 minutes 48 seconds  Total Procedure Duration: 0 hours 23 minutes 7 seconds  Findings:                 The perianal and digital rectal examinations were                            normal.                           A 17 mm polyp was found in the descending colon.                            The polyp was semi-pedunculated. The polyp was  removed with a hot snare. Resection and retrieval                            were complete.                           A 10 mm polyp was found in the sigmoid colon. The                            polyp was sessile. The polyp was removed with a hot                            snare. Resection and retrieval were complete.                           Five sessile polyps were found in the descending                            colon, transverse colon and cecum. The polyps were                            6 to 8 mm in size. These polyps were removed with a                            cold snare. Resection and retrieval were complete.                           Two sessile polyps were found in the ascending                            colon and cecum. The polyps were 4 to 5 mm in size.                            These polyps were removed with a cold biopsy                             forceps. Resection and retrieval were complete.                           There was a small lipoma, 12 mm in diameter, in the                            transverse colon.                           Multiple medium-mouthed diverticula were found in                            the left colon.                           Internal hemorrhoids were found during  retroflexion. The hemorrhoids were small and Grade                            I (internal hemorrhoids that do not prolapse).                           The exam was otherwise without abnormality on                            direct and retroflexion views. Complications:            No immediate complications. Estimated blood loss:                            None. Estimated Blood Loss:     Estimated blood loss: none. Impression:               - One 17 mm polyp in the descending colon, removed                            with a hot snare. Resected and retrieved.                           - One 10 mm polyp in the sigmoid colon, removed                            with a hot snare. Resected and retrieved.                           - Five 6 to 8 mm polyps in the descending colon, in                            the transverse colon and in the cecum, removed with                            a cold snare. Resected and retrieved.                           - Two 4 to 5 mm polyps in the ascending colon and                            in the cecum, removed with a cold biopsy forceps.                            Resected and retrieved.                           - Small lipoma in the transverse colon.                           - Diverticulosis in the left colon.                           - Internal hemorrhoids. Recommendation:           -  Repeat colonoscopy in 1 year for surveillance                            with a more extensive bowel prep.                           - Patient has a contact number available for                             emergencies. The signs and symptoms of potential                            delayed complications were discussed with the                            patient. Return to normal activities tomorrow.                            Written discharge instructions were provided to the                            patient.                           - Resume previous diet.                           - Continue present medications. Ladene Artist, MD 04/05/2018 3:07:25 PM This report has been signed electronically.

## 2018-04-05 NOTE — Progress Notes (Signed)
Called to room to assist during endoscopic procedure.  Patient ID and intended procedure confirmed with present staff. Received instructions for my participation in the procedure from the performing physician.  

## 2018-04-05 NOTE — Progress Notes (Signed)
Pt's states no medical or surgical changes since previsit or office visit. 

## 2018-04-05 NOTE — Patient Instructions (Signed)
**  Handouts given on polyps, diverticulosis and hemorrhoids**   YOU HAD AN ENDOSCOPIC PROCEDURE TODAY: Refer to the procedure report and other information in the discharge instructions given to you for any specific questions about what was found during the examination. If this information does not answer your questions, please call [place] office at [phone] to clarify.   YOU SHOULD EXPECT: Some feelings of bloating in the abdomen. Passage of more gas than usual. Walking can help get rid of the air that was put into your GI tract during the procedure and reduce the bloating. If you had a lower endoscopy (such as a colonoscopy or flexible sigmoidoscopy) you may notice spotting of blood in your stool or on the toilet paper. Some abdominal soreness may be present for a day or two, also.  DIET: Your first meal following the procedure should be a light meal and then it is ok to progress to your normal diet. A half-sandwich or bowl of soup is an example of a good first meal. Heavy or fried foods are harder to digest and may make you feel nauseous or bloated. Drink plenty of fluids but you should avoid alcoholic beverages for 24 hours. If you had a esophageal dilation, please see attached instructions for diet.    ACTIVITY: Your care partner should take you home directly after the procedure. You should plan to take it easy, moving slowly for the rest of the day. You can resume normal activity the day after the procedure however YOU SHOULD NOT DRIVE, use power tools, machinery or perform tasks that involve climbing or major physical exertion for 24 hours (because of the sedation medicines used during the test).   SYMPTOMS TO REPORT IMMEDIATELY: A gastroenterologist can be reached at any hour. Please call [phone]  for any of the following symptoms:  Following lower endoscopy (colonoscopy, flexible sigmoidoscopy) Excessive amounts of blood in the stool  Significant tenderness, worsening of abdominal  pains  Swelling of the abdomen that is new, acute  Fever of 100 or higher    FOLLOW UP:  If any biopsies were taken you will be contacted by phone or by letter within the next 1-3 weeks. Call [phone]  if you have not heard about the biopsies in 3 weeks.  Please also call with any specific questions about appointments or follow up tests.

## 2018-04-05 NOTE — Progress Notes (Signed)
Report to PACU, RN, vss, BBS= Clear.  

## 2018-04-06 ENCOUNTER — Telehealth: Payer: Self-pay | Admitting: *Deleted

## 2018-04-06 NOTE — Telephone Encounter (Signed)
  Follow up Call-  Call back number 04/05/2018  Post procedure Call Back phone  # [phone]  Permission to leave phone message Yes  Some recent data might be hidden     Patient questions:  Do you have a fever, pain , or abdominal swelling? No. Pain Score  0 *  Have you tolerated food without any problems? Yes.    Have you been able to return to your normal activities? Yes.    Do you have any questions about your discharge instructions: Diet   No. Medications  No. Follow up visit  No.  Do you have questions or concerns about your Care? No.  Actions: * If pain score is 4 or above: No action needed, pain <4.  Information provided by wife patient was gone for appointment.

## 2018-04-13 ENCOUNTER — Other Ambulatory Visit: Payer: Medicare Other | Admitting: *Deleted

## 2018-04-13 DIAGNOSIS — E785 Hyperlipidemia, unspecified: Secondary | ICD-10-CM

## 2018-04-13 DIAGNOSIS — I491 Atrial premature depolarization: Secondary | ICD-10-CM

## 2018-04-14 LAB — HEPATIC FUNCTION PANEL
ALT: 11 IU/L (ref 0–44)
AST: 19 IU/L (ref 0–40)
Albumin: 4.2 g/dL (ref 3.6–4.8)
Alkaline Phosphatase: 100 IU/L (ref 39–117)
BILIRUBIN, DIRECT: 0.17 mg/dL (ref 0.00–0.40)
Bilirubin Total: 0.6 mg/dL (ref 0.0–1.2)
TOTAL PROTEIN: 6.9 g/dL (ref 6.0–8.5)

## 2018-04-14 LAB — LIPID PANEL
Chol/HDL Ratio: 3.6 ratio (ref 0.0–5.0)
Cholesterol, Total: 150 mg/dL (ref 100–199)
HDL: 42 mg/dL (ref >39)
LDL CALC: 83 mg/dL (ref 0–99)
TRIGLYCERIDES: 126 mg/dL (ref 0–149)
VLDL CHOLESTEROL CAL: 25 mg/dL (ref 5–40)

## 2018-04-14 LAB — BASIC METABOLIC PANEL
BUN/Creatinine Ratio: 12 (ref 10–24)
BUN: 16 mg/dL (ref 8–27)
CO2: 25 mmol/L (ref 20–29)
Calcium: 10.3 mg/dL — ABNORMAL HIGH (ref 8.6–10.2)
Chloride: 102 mmol/L (ref 96–106)
Creatinine, Ser: 1.31 mg/dL — ABNORMAL HIGH (ref 0.76–1.27)
GFR calc non Af Amer: 56 mL/min/{1.73_m2} — ABNORMAL LOW (ref >59)
GFR, EST AFRICAN AMERICAN: 64 mL/min/{1.73_m2} (ref >59)
GLUCOSE: 98 mg/dL (ref 65–99)
Potassium: 5.2 mmol/L (ref 3.5–5.2)
SODIUM: 141 mmol/L (ref 134–144)

## 2018-04-14 LAB — TSH: TSH: 2.71 u[IU]/mL (ref 0.450–4.500)

## 2018-04-14 LAB — MAGNESIUM: MAGNESIUM: 1.9 mg/dL (ref 1.6–2.3)

## 2018-04-17 ENCOUNTER — Telehealth: Payer: Self-pay | Admitting: *Deleted

## 2018-04-17 NOTE — Telephone Encounter (Signed)
Left message to go over lab results.  

## 2018-04-17 NOTE — Telephone Encounter (Signed)
-----   Message from Liliane Shi, PA-C sent at 04/16/2018  7:55 PM EDT ----- Lipids at goal.  TSH, Magnesium, LFTs normal.  Creatinine is stable. All other values are normal or within acceptable limits.   Medication changes / Follow up labs / Other changes or recommendations:    - Continue current medications and follow up as planned.  Richardson Dopp, PA-C 04/16/2018 7:54 PM

## 2018-04-18 ENCOUNTER — Telehealth: Payer: Self-pay | Admitting: Physician Assistant

## 2018-04-18 NOTE — Telephone Encounter (Signed)
Pt has been notified of lab results by phone with verbal understanding. Pt thanked me for the call.   

## 2018-04-18 NOTE — Telephone Encounter (Signed)
See 9/3 phone note

## 2018-04-18 NOTE — Telephone Encounter (Signed)
-----   Message from Liliane Shi, PA-C sent at 04/16/2018  7:55 PM EDT ----- Lipids at goal.  TSH, Magnesium, LFTs normal.  Creatinine is stable. All other values are normal or within acceptable limits.   Medication changes / Follow up labs / Other changes or recommendations:    - Continue current medications and follow up as planned.  Richardson Dopp, PA-C 04/16/2018 7:54 PM

## 2018-04-18 NOTE — Telephone Encounter (Signed)
Left message x 2 to go over lab results.  

## 2018-04-18 NOTE — Telephone Encounter (Signed)
New Message ° ° ° ° ° ° ° ° ° °Patient returned your call, would like a call back. °

## 2018-04-19 ENCOUNTER — Encounter: Payer: Self-pay | Admitting: Physician Assistant

## 2018-04-23 ENCOUNTER — Encounter: Payer: Self-pay | Admitting: Family Medicine

## 2018-04-23 ENCOUNTER — Ambulatory Visit (INDEPENDENT_AMBULATORY_CARE_PROVIDER_SITE_OTHER): Payer: Medicare Other | Admitting: Family Medicine

## 2018-04-23 VITALS — BP 120/88 | HR 71 | Temp 97.8°F | Ht 68.25 in | Wt 206.5 lb

## 2018-04-23 DIAGNOSIS — M5416 Radiculopathy, lumbar region: Secondary | ICD-10-CM | POA: Diagnosis not present

## 2018-04-23 MED ORDER — PREDNISONE 20 MG PO TABS: ORAL_TABLET | ORAL | 0 refills | Status: DC

## 2018-04-23 MED ORDER — TIZANIDINE HCL 4 MG PO TABS: 4.0000 mg | ORAL_TABLET | Freq: Every day | ORAL | 1 refills | Status: DC

## 2018-04-23 NOTE — Progress Notes (Signed)
Dr. Frederico Hamman T. Wyolene Weimann, MD, [facility] [place], [address] Phone: [phone] Fax: [phone]  04/23/2018  Patient: Howard [name], MRN: [id number], DOB: 01-05-1951, 67 y.o.  Primary Physician:  Tonia Ghent, MD   Chief Complaint  Patient presents with  . Leg Pain    Left ?Sciatica   Subjective:   Howard [name] is a 67 y.o. very pleasant male patient who presents with the following: Back Pain  ongoing for approximately: 3-4 weeks The patient has had back pain before. The back pain is localized into the lumbar spine area. They also describe L radiculopathy.  3-4 weeks ago, working on his burshhog and hurt his back and then down into his his buttocks now and going down to a little below. No numbness. No tingling.  Buttocks pain  Had some L sided sciatica before - went away after a couple of months.   l knee reflex 1+  No numbness or tingling. No bowel or bladder incontinence. No focal weakness. Prior interventions: none Physical therapy: No Chiropractic manipulations: did not help Acupuncture: No Osteopathic manipulation: No Heat or cold: Minimal effect  Past Medical History, Surgical History, Family History, Medications, Allergies have been reviewed and updated if relevant.  Patient Active Problem List   Diagnosis Date Noted  . Paroxysmal atrial fibrillation ([place]) 02/20/2018  . Atypical chest pain 02/20/2018  . Chest pain 02/09/2018  . Left groin pain 01/14/2018  . Vitamin D deficiency 12/10/2017  . Intermittent claudication ([place]) 07/20/2017  . OSA (obstructive sleep apnea) 06/02/2017  . Thoracic ascending aortic aneurysm ([place]) 04/17/2017  . Near syncope 03/14/2017  . Hypersomnia 01/05/2017  . Colon cancer screening 12/01/2016  . Health care maintenance 12/01/2016  . GERD (gastroesophageal reflux disease) 12/01/2016  . BPV (benign positional vertigo) 04/21/2016  . Advance care planning  08/27/2015  . Hyperlipidemia LDL goal <70 08/04/2014  . Medicare welcome visit 12/20/2012  . Prostate cancer ([place]) 01/13/2011  . Essential hypertension 06/25/2008  . ATOPIC RHINITIS 01/26/2007  . 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 Anesthesia complication    prolonged sedation after colonoscopy  . Colon polyps    on colonoscopy 08/2010  . ED (erectile dysfunction)   . Essential hypertension 06/25/2008   Qualifier: Diagnosis of  By: Council Mechanic MD, Hilaria Ota   . GERD (gastroesophageal reflux disease)   . HYPERLIPIDEMIA 07/24/2008   Qualifier: Diagnosis of  By: Council Mechanic MD, Hilaria Ota   . Hypersomnia 01/05/2017  . Near syncope 03/14/2017  . Neck pain 12/15/2011  . OSA (obstructive sleep apnea) 11/13/2016   Dr. Elsworth Soho  . Prostate cancer [facility])    s/p prostatectomy  . Sleep apnea 2018  . Thoracic ascending aortic aneurysm ([place]) 04/17/2017   Chest CTA 10/18: Ascending thoracic aneurysm 4.1 cm, aortic root 4.5 cm >> follow-up 1 year // Echo 8/18:  - Ascending aorta: The ascending aorta was moderately dilated, 4.3 cm. Aortic arch is 3.3 cm    Past Surgical History:  Procedure Laterality Date  . COLONOSCOPY    . ESOPHAGOGASTRODUODENOSCOPY  10/02/2000   dilated reflux esopsh hh  . [facility]: Dehydration; vasvagal sync  12/09/ 05/2003  . PLANTAR FASCIA SURGERY Bilateral   . POLYPECTOMY    . ROBOT ASSISTED LAPAROSCOPIC RADICAL PROSTATECTOMY N/A 05/27/2013   Procedure: ROBOTIC ASSISTED LAPAROSCOPIC RADICAL PROSTATECTOMY LEVEL 1;  Surgeon: Dutch Gray, MD;  Location: [facility]S;  Service: Urology;  Laterality: N/A;  . UPPER GASTROINTESTINAL ENDOSCOPY      Social History   Socioeconomic History  . Marital status: Married    Spouse name: Not on file  . Number of children: Not on file  . Years of education: Not on file  . Highest education level: Not on file  Occupational History  . Occupation: Retired    Comment: [employer]  Social  Needs  . Financial resource strain: Not on file  . Food insecurity:    Worry: Not on file    Inability: Not on file  . Transportation needs:    Medical: Not on file    Non-medical: Not on file  Tobacco Use  . Smoking status: Never Smoker  . Smokeless tobacco: Never Used  Substance and Sexual Activity  . Alcohol use: No    Alcohol/week: 0.0 standard drinks    Comment: no  . Drug use: No  . Sexual activity: Not on file  Lifestyle  . Physical activity:    Days per week: Not on file    Minutes per session: Not on file  . Stress: Not on file  Relationships  . Social connections:    Talks on phone: Not on file    Gets together: Not on file    Attends religious service: Not on file    Active member of club or organization: Not on file    Attends meetings of clubs or organizations: Not on file    Relationship status: Not on file  . Intimate partner violence:    Fear of current or ex partner: Not on file    Emotionally abused: Not on file    Physically abused: Not on file    Forced sexual activity: Not on file  Other Topics Concern  . Not on file  Social History Narrative   Retired [employer], parttime bailiff- laid off as of 2018.    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     Married 1976, lives with wife. 1 step daughter, 2 daughters and 1 son.   [place] fan    Family History  Problem Relation Age of Onset  . Stroke Mother   . Prostate cancer Neg Hx   . Colon cancer Neg Hx   . Aortic dissection Neg Hx   . Esophageal cancer Neg Hx   . Colon polyps Neg Hx   . Rectal cancer Neg Hx   . Stomach cancer Neg Hx     Allergies  Allergen Reactions  . Ibuprofen     "Eats a hole in the lining of his stomach"    Medication list reviewed and updated in full in [facility].  GEN: No fevers, chills. Nontoxic. Primarily MSK c/o today. MSK: Detailed in the HPI GI: tolerating PO intake without difficulty Neuro: As above  Otherwise the pertinent positives of the ROS  are noted above.    Objective:   Blood pressure 120/88, pulse 71, temperature 97.8 F (36.6 C), temperature source Oral, height 5' 8.25" (1.734 m), weight 206 lb 8 oz (93.7 kg).  Gen: Well-developed,well-nourished,in no acute distress; alert,appropriate and cooperative throughout examination HEENT: Normocephalic and atraumatic without obvious abnormalities.  Ears, externally no deformities Pulm: Breathing comfortably in no respiratory distress Range of motion at  the waist: Flexion, rotation and lateral bending: full, some pain with ext  No echymosis or edema Rises to examination table with no difficulty Gait: minimally antalgic  Inspection/Deformity: No abnormality Paraspinus T:  ttp l2-s1, more on  the L  B Ankle Dorsiflexion (L5,4): 5/5 B Great Toe Dorsiflexion (L5,4): 5/5 Heel Walk (L5): WNL Toe Walk (S1): WNL Rise/Squat (L4): WNL, mild pain  SENSORY B Medial Foot (L4): WNL B Dorsum (L5): WNL B Lateral (S1): WNL Light Touch: WNL Pinprick: WNL  REFLEXES Knee (L4): 2+, 1+ on L Ankle (S1): 2+  B SLR, seated: neg B SLR, supine: neg B FABER: neg B Reverse FABER: neg B Greater Troch: NT B Log Roll: neg B Stork: NT B Sciatic Notch: NT  Radiology: No results found.  Assessment and Plan:   Left lumbar radiculopathy  Anatomy reviewed. Conservative algorithms for acute back pain generally begin with the following: NSAIDS, Muscle Relaxants, Mild pain medication  ? 1+ DTR on the L  Start with medications, core rehab, and progress from there following low back pain algorithm. No red flags are present. Piriformis stretching started.  Follow-up: if needed  Meds ordered this encounter  Medications  . predniSONE (DELTASONE) 20 MG tablet    Sig: 2 tabs po for 5 days, then 1 tab po for 5 days    Dispense:  15 tablet    Refill:  0  . tiZANidine (ZANAFLEX) 4 MG tablet    Sig: Take 1 tablet (4 mg total) by mouth at bedtime.    Dispense:  30 tablet    Refill:  1    No orders of the defined types were placed in this encounter.   Signed,  Maud Deed. Esmee Fallaw, MD   Allergies as of 04/23/2018      Reactions   Ibuprofen    "Eats a hole in the lining of his stomach"      Medication List        Accurate as of 04/23/18  3:02 PM. Always use your most recent med list.          atorvastatin 20 MG tablet Commonly known as:  LIPITOR Take 1 tablet (20 mg total) by mouth daily.   cholecalciferol 1000 units tablet Commonly known as:  VITAMIN D Take 1 tablet (1,000 Units total) by mouth daily.   losartan 25 MG tablet Commonly known as:  COZAAR Take 1 tablet (25 mg total) by mouth daily.   metoprolol succinate 25 MG 24 hr tablet Commonly known as:  TOPROL-XL Take 1 tablet (25 mg total) by mouth at bedtime.   nitroGLYCERIN 0.4 MG SL tablet Commonly known as:  NITROSTAT Place 1 tablet (0.4 mg total) under the tongue every 5 (five) minutes as needed for chest pain.   predniSONE 20 MG tablet Commonly known as:  DELTASONE 2 tabs po for 5 days, then 1 tab po for 5 days   tiZANidine 4 MG tablet Commonly known as:  ZANAFLEX Take 1 tablet (4 mg total) by mouth at bedtime.   ZEGERID OTC 20-1100 MG Caps capsule Generic drug:  Omeprazole-Sodium Bicarbonate Take 1 capsule by mouth daily before breakfast.

## 2018-04-24 ENCOUNTER — Encounter: Payer: Self-pay | Admitting: Gastroenterology

## 2018-05-04 ENCOUNTER — Encounter: Payer: Self-pay | Admitting: Physician Assistant

## 2018-05-04 ENCOUNTER — Ambulatory Visit (INDEPENDENT_AMBULATORY_CARE_PROVIDER_SITE_OTHER): Payer: Medicare Other | Admitting: Physician Assistant

## 2018-05-04 VITALS — BP 136/90 | HR 56 | Ht 69.0 in | Wt 201.8 lb

## 2018-05-04 DIAGNOSIS — I2510 Atherosclerotic heart disease of native coronary artery without angina pectoris: Secondary | ICD-10-CM | POA: Insufficient documentation

## 2018-05-04 DIAGNOSIS — K219 Gastro-esophageal reflux disease without esophagitis: Secondary | ICD-10-CM | POA: Diagnosis not present

## 2018-05-04 DIAGNOSIS — I7121 Aneurysm of the ascending aorta, without rupture: Secondary | ICD-10-CM

## 2018-05-04 DIAGNOSIS — I480 Paroxysmal atrial fibrillation: Secondary | ICD-10-CM | POA: Diagnosis not present

## 2018-05-04 DIAGNOSIS — I491 Atrial premature depolarization: Secondary | ICD-10-CM | POA: Diagnosis not present

## 2018-05-04 DIAGNOSIS — I10 Essential (primary) hypertension: Secondary | ICD-10-CM

## 2018-05-04 DIAGNOSIS — I712 Thoracic aortic aneurysm, without rupture: Secondary | ICD-10-CM

## 2018-05-04 MED ORDER — OMEPRAZOLE 20 MG PO CPDR: DELAYED_RELEASE_CAPSULE | ORAL | 11 refills | Status: DC

## 2018-05-04 NOTE — Progress Notes (Signed)
Cardiology Office Note:    Date:  05/04/2018   ID:  Howard [name], Howard [name] 1950/11/25, MRN [id number]  PCP:  Tonia Ghent, MD  Cardiologist:  Sherren Mocha, MD   Electrophysiologist:  None   Referring MD: Tonia Ghent, MD   Chief Complaint  Patient presents with  . Follow-up    chest pain     History of Present Illness:    Howard [name] is a 67 y.o. male with hypertension, thoracic aortic aneurysm, coronary calcification.  Nuclear stress test in July 2019 demonstrated no ischemia.  Of note, atrial fibrillation was noted during stress testing.  Event monitor was arranged.  He was last seen 03/23/2018.  He had PACs on ECG which were felt to be symptomatic.  He was placed on beta-blocker therapy.  Event monitor demonstrated sinus rhythm with frequent PACs and brief atrial runs.     Howard [name] returns for follow-up.  He is here alone.  He has had recurrent episodes of chest discomfort.  He points to his epigastrium.  These seem to occur at rest and with meals.  He has also noted increased belching.  He denies exertional chest symptoms or shortness of breath.  He denies syncope, orthopnea, lower extremity swelling.  He has not had any fast palpitations since starting on Toprol.  He does have a history of esophageal stricture.  He currently denies dysphagia, odynophagia or melena/hematochezia.  Prior CV studies:   The following studies were reviewed today:  Event monitor 02/22/2018  The patient was monitored for 657:42 hours, of which 625:25 hours yielded usable tracings.  The predominant rhythm was sinus with an average rate of 76 bpm.  There were frequent PAC's and multiple brief atrial runs.  Rare PVC's were observed.  There was no sustained arrhythmia or prolonged pause.  Predominantly sinus rhythm with frequent PAC's and brief atrial runs.  Nuclear stress test 02/16/2018 No significant ischemia, EF 50; paroxysmal atrial fibrillation noted after administration of  Lexiscan, but spontaneously resolved.  Chest CTA 02/09/2018 IMPRESSION: 1. No evidence of acute aortic or branch vessel dissection. 2. Stable to incrementally increased aneurysmal dilatation of the tubular portion of the ascending thoracic aorta with a maximal diameter of 4.2 cm. Recommend annual imaging followup by CTA or MRA. 3. Stable ectasia of the aortic root with a maximal diameter of 4.8 cm. 4. Coronary artery calcifications.  ABIs 08/11/2017 Normal  Chest CTA 06/01/2017 4.1 cm ascending thoracic aortic aneurysm Coronary artery calcifications noted  Echocardiogram 04/14/17 EF 55-60, normal wall motion, grade 1 diastolic dysfunction, mild to moderate aortic root dilation (4 cm), moderately dilated ascending aorta (4.3 cm, aortic arch 3.3 cm), mild MR, normal RVSF  Chest CTA 12/07/2004 Vessels are well opacified and there is no evidence for a thoracic dissection or aneurysm. The pulmonary arteries are also well opacified and there is no evidence for pulmonary embolus. There is no mediastinal, hilar or axillary adenopathy. Lung windows show no nodules, pleural effusions or infiltrates. There is minimal bibasilar atelectasis but no evidence for interstitial fibrosis. The findings on the recent chest x-ray may be related to hypoinflation. Images of the upper abdomen are unremarkable.  IMPRESSION:  No evidence for acute abnormality of the chest.  Past Medical History:  Diagnosis Date  . Anesthesia complication    prolonged sedation after colonoscopy  . Colon polyps    on colonoscopy 08/2010  . ED (erectile dysfunction)   . Essential hypertension 06/25/2008   Qualifier: Diagnosis of  By: Council Mechanic  MD, Hilaria Ota   . GERD (gastroesophageal reflux disease)   . HYPERLIPIDEMIA 07/24/2008   Qualifier: Diagnosis of  By: Council Mechanic MD, Hilaria Ota   . Hypersomnia 01/05/2017  . Near syncope 03/14/2017  . Neck pain 12/15/2011  . OSA (obstructive sleep apnea) 11/13/2016   Dr. Elsworth Soho  .  Prostate cancer [facility])    s/p prostatectomy  . Sleep apnea 2018  . Thoracic ascending aortic aneurysm ([place]) 04/17/2017   Chest CTA 10/18: Ascending thoracic aneurysm 4.1 cm, aortic root 4.5 cm >> follow-up 1 year // Echo 8/18:  - Ascending aorta: The ascending aorta was moderately dilated, 4.3 cm. Aortic arch is 3.3 cm   Surgical Hx: The patient  has a past surgical history that includes Esophagogastroduodenoscopy (10/02/2000); MCH: Dehydration; vasvagal sync (12/09/ 05/2003); Plantar fascia surgery (Bilateral); Robot assisted laparoscopic radical prostatectomy (N/A, 05/27/2013); Colonoscopy; Polypectomy; and Upper gastrointestinal endoscopy.   Current Medications: Current Meds  Medication Sig  . atorvastatin (LIPITOR) 20 MG tablet Take 1 tablet (20 mg total) by mouth daily.  . cholecalciferol (VITAMIN D) 1000 units tablet Take 1 tablet (1,000 Units total) by mouth daily.  Marland Kitchen losartan (COZAAR) 25 MG tablet Take 1 tablet (25 mg total) by mouth daily.  . metoprolol succinate (TOPROL XL) 25 MG 24 hr tablet Take 1 tablet (25 mg total) by mouth at bedtime.  . nitroGLYCERIN (NITROSTAT) 0.4 MG SL tablet Place 1 tablet (0.4 mg total) under the tongue every 5 (five) minutes as needed for chest pain.  Marland Kitchen Omeprazole-Sodium Bicarbonate (ZEGERID OTC) 20-1100 MG CAPS Take 1 capsule by mouth daily before breakfast.  . tiZANidine (ZANAFLEX) 4 MG capsule Take 4 mg by mouth at bedtime as needed for muscle spasms (for leg).     Allergies:   Ibuprofen   Social History   Tobacco Use  . Smoking status: Never Smoker  . Smokeless tobacco: Never Used  Substance Use Topics  . Alcohol use: No    Alcohol/week: 0.0 standard drinks    Comment: no  . Drug use: No     Family Hx: The patient's family history includes Stroke in his mother. There is no history of Prostate cancer, Colon cancer, Aortic dissection, Esophageal cancer, Colon polyps, Rectal cancer, or Stomach cancer.  ROS:   Please see the history of  present illness.    Review of Systems  Constitution: Positive for decreased appetite.  Cardiovascular: Positive for chest pain.  Musculoskeletal: Positive for back pain.   All other systems reviewed and are negative.   EKGs/Labs/Other Test Reviewed:    EKG:  EKG is  ordered today.  The ekg ordered today demonstrates sinus bradycardia, heart rate 56, normal axis, QTC 370, no acute changes  Recent Labs: 04/13/2018: ALT 11; BUN 16; Creatinine, Ser 1.31; Magnesium 1.9; Potassium 5.2; Sodium 141; TSH 2.710   Recent Lipid Panel Lab Results  Component Value Date/Time   CHOL 150 04/13/2018 08:27 AM   TRIG 126 04/13/2018 08:27 AM   HDL 42 04/13/2018 08:27 AM   CHOLHDL 3.6 04/13/2018 08:27 AM   CHOLHDL 5 11/30/2017 10:57 AM   LDLCALC 83 04/13/2018 08:27 AM   LDLDIRECT 172.4 12/13/2012 08:08 AM    Physical Exam:    VS:  BP 136/90 (BP Location: Left Arm, Patient Position: Sitting, Cuff Size: Normal)   Pulse (!) 56   Ht 5\' 9"  (1.753 m)   Wt 201 lb 12.8 oz (91.5 kg)   BMI 29.80 kg/m     Wt Readings from Last 3 Encounters:  05/04/18 201 lb 12.8 oz (91.5 kg)  04/23/18 206 lb 8 oz (93.7 kg)  04/05/18 207 lb (93.9 kg)     Physical Exam  Constitutional: He is oriented to person, place, and time. He appears well-developed and well-nourished. No distress.  HENT:  Head: Normocephalic and atraumatic.  Eyes: No scleral icterus.  Neck: No JVD present. No thyromegaly present.  Cardiovascular: Normal rate and regular rhythm.  No murmur heard. Pulmonary/Chest: Effort normal. He has no rales.  Abdominal: Soft. He exhibits no distension.  Musculoskeletal: He exhibits no edema.  Lymphadenopathy:    He has no cervical adenopathy.  Neurological: He is alert and oriented to person, place, and time.  Skin: Skin is warm and dry.  Psychiatric: He has a normal mood and affect.    ASSESSMENT & PLAN:    Premature atrial beats  No PACs noted on ECG today.  He has not had rapid palpitations since  starting on Toprol.  He still has symptoms of chest discomfort.  I believe that his chest discomfort is related to GERD.  Continue current dose of Metoprolol succinate.   Gastroesophageal reflux disease, esophagitis presence not specified He has symptoms of chest pain related to meals with assoc belching.  He is already on Zegerid. I suspect his chest pain is related to GERD.    -Continue Zegerid Q AM  -Start Prilosec 20 mg Q PM   -Follow up with PCP; he may need referral to GI  Paroxysmal atrial fibrillation ([place]) Noted on stress test.  No evidence of AFib on Event Monitor.  If he has recurrent atrial fibrillation, he will need anticoagulation (CHADS2-VASc=3).     Coronary artery calcification seen on CT scan Recent Nuclear stress test low risk.  As noted, I believe his chest pain is related to GERD.  If his symptoms progress (ie exertional chest pain, dyspnea on exertion, fatigue), we will need to consider Coronary CTA with FFR or Cardiac Catheterization. Continue Atorvastatin.    Thoracic ascending aortic aneurysm ([place]) Repeat CT due in 01/2019.  With Dr. Darnelle Bos transition to [facility], I will have him follow up with me and Dr. Burt Knack here.   Essential hypertension BP borderline today. Continue to monitor.  Continue Losartan, Metoprolol succinate.     Dispo:  Return in about 3 months (around 08/03/2018) for Routine Follow Up, w/ Richardson Dopp, PA-C.   Medication Adjustments/Labs and Tests Ordered: Current medicines are reviewed at length with the patient today.  Concerns regarding medicines are outlined above.  Tests Ordered: Orders Placed This Encounter  Procedures  . EKG 12-Lead   Medication Changes: Meds ordered this encounter  Medications  . omeprazole (PRILOSEC) 20 MG capsule    Sig: Take 30 minutes before dinner    Dispense:  30 capsule    Refill:  [address], Richardson Dopp, PA-C  05/04/2018 10:22 AM    [facility] [place],  [place], [place]  [address] Phone: [phone]; Fax: [phone]

## 2018-05-04 NOTE — Patient Instructions (Addendum)
Medication Instructions:  1. START PRILOSEC 20 MG TAKE 30 MINUTES BEFORE DINNER TIME  Labwork: NONE ORDERED TODAY  Testing/Procedures: NONE ORDERED TODAY  Follow-Up: Richardson Dopp, [facility] 08/03/18 @ 10:15 AM   Any Other Special Instructions Will Be Listed Below (If Applicable). FOLLOW UP WITH PRIMARY CARE IN A FEW WEEKS     If you need a refill on your cardiac medications before your next appointment, please call your pharmacy.

## 2018-05-18 ENCOUNTER — Ambulatory Visit (INDEPENDENT_AMBULATORY_CARE_PROVIDER_SITE_OTHER): Payer: Medicare Other | Admitting: Family Medicine

## 2018-05-18 ENCOUNTER — Encounter: Payer: Self-pay | Admitting: Family Medicine

## 2018-05-18 DIAGNOSIS — K219 Gastro-esophageal reflux disease without esophagitis: Secondary | ICD-10-CM

## 2018-05-18 NOTE — Patient Instructions (Addendum)
Keep taking both stomach meds for another 2 weeks.   At that point, if doing well, stop omeprazole and continue omeprazole/bicarb combination.  If you have you more troubles then restart the extra omeprazole and let me know so we'll set you up with the GI clinic.  Take care.  Glad to see you.

## 2018-05-18 NOTE — Progress Notes (Signed)
He had a R lower molar pulled yesterday.  He has some jaw pain today, as expected.  No fevers.  If worse, then he'll f/u with the dental clinic.   He had been at the beach and was not eating healthy foods, had more caffeine.  Saw cards clinic in the meantime.  ==================================== Per cards:  Premature atrial beats  No PACs noted on ECG today.  He has not had rapid palpitations since starting on Toprol.  He still has symptoms of chest discomfort.  I believe that his chest discomfort is related to GERD.  Continue current dose of Metoprolol succinate.   Gastroesophageal reflux disease, esophagitis presence not specified He has symptoms of chest pain related to meals with assoc belching.  He is already on Zegerid. I suspect his chest pain is related to GERD.               -Continue Zegerid Q AM             -Start Prilosec 20 mg Q PM              -Follow up with PCP; he may need referral to GI  Paroxysmal atrial fibrillation ([place]) Noted on stress test.  No evidence of AFib on Event Monitor.  If he has recurrent atrial fibrillation, he will need anticoagulation (CHADS2-VASc=3).     Coronary artery calcification seen on CT scan Recent Nuclear stress test low risk.  As noted, I believe his chest pain is related to GERD.  If his symptoms progress (ie exertional chest pain, dyspnea on exertion, fatigue), we will need to consider Coronary CTA with FFR or Cardiac Catheterization. Continue Atorvastatin.    ========================================= Interval history. No palpitations.  Since starting omeprazole with zegerid his sx are clearly better.  No CP.  Not SOB.  No BLE edema.  No exertional chest pain. Work is going well.  No vomiting, no nausea.  No diarrhea.  No blood in stool, no black stools.  He is back on a healthier diet.  He cut back on caffeine.  He cut out candy bars.    He has seen [facility] about his back w/relief of sx.    Meds, vitals, and allergies reviewed.   ROS:  Per HPI unless specifically indicated in ROS section   GEN: nad, alert and oriented HEENT: mucous membranes moist NECK: supple w/o LA CV: rrr.  no murmur PULM: ctab, no inc wob ABD: soft, +bs EXT: no edema SKIN: Well-perfused

## 2018-05-20 ENCOUNTER — Other Ambulatory Visit: Payer: Self-pay | Admitting: Physician Assistant

## 2018-05-20 NOTE — Assessment & Plan Note (Signed)
At this point, continue taking omeprazole and Zegerid.  He will do that for another 2 weeks. At that point, if doing well, stop omeprazole and continue omeprazole/bicarb combination.  If more troubles then restart the extra omeprazole and let me know so we'll set him up with the GI clinic.  Okay for outpatient follow-up.  He agrees.

## 2018-06-21 ENCOUNTER — Ambulatory Visit (INDEPENDENT_AMBULATORY_CARE_PROVIDER_SITE_OTHER): Payer: Medicare Other

## 2018-06-21 DIAGNOSIS — Z23 Encounter for immunization: Secondary | ICD-10-CM

## 2018-08-03 ENCOUNTER — Encounter: Payer: Self-pay | Admitting: Physician Assistant

## 2018-08-03 ENCOUNTER — Ambulatory Visit (INDEPENDENT_AMBULATORY_CARE_PROVIDER_SITE_OTHER): Payer: Medicare Other | Admitting: Physician Assistant

## 2018-08-03 VITALS — BP 110/80 | HR 74 | Ht 69.0 in | Wt 206.8 lb

## 2018-08-03 DIAGNOSIS — I491 Atrial premature depolarization: Secondary | ICD-10-CM

## 2018-08-03 DIAGNOSIS — I2510 Atherosclerotic heart disease of native coronary artery without angina pectoris: Secondary | ICD-10-CM | POA: Diagnosis not present

## 2018-08-03 DIAGNOSIS — K219 Gastro-esophageal reflux disease without esophagitis: Secondary | ICD-10-CM | POA: Diagnosis not present

## 2018-08-03 DIAGNOSIS — I712 Thoracic aortic aneurysm, without rupture: Secondary | ICD-10-CM

## 2018-08-03 DIAGNOSIS — I480 Paroxysmal atrial fibrillation: Secondary | ICD-10-CM | POA: Diagnosis not present

## 2018-08-03 DIAGNOSIS — I7121 Aneurysm of the ascending aorta, without rupture: Secondary | ICD-10-CM

## 2018-08-03 NOTE — Progress Notes (Signed)
Cardiology Office Note:    Date:  08/03/2018   ID:  Derico, Mitton Jul 31, 1951, MRN [id number]  PCP:  Tonia Ghent, MD  Cardiologist:  Sherren Mocha, MD   Electrophysiologist:  None   Referring MD: Tonia Ghent, MD   Chief Complaint  Patient presents with  . Follow-up    chest pain, palps, TAA     History of Present Illness:    Domingos Riggi is a 67 y.o. male with hypertension, thoracic aortic aneurysm, coronary calcification.  Nuclear stress test in July 2019 demonstrated no ischemia.  Of note, atrial fibrillation was noted during stress testing.  Event monitor was arranged.  He was last seen 03/23/2018.  He had PACs on ECG which were felt to be symptomatic.  He was placed on beta-blocker therapy.  Event monitor demonstrated sinus rhythm with frequent PACs and brief atrial runs.  He was last seen in September 2019.  He continued to have episodes of chest discomfort that sound consistent with acid reflux disease.  Follow-up with primary care was recommended.  Mr. Kinnett returns for follow-up.  He is here alone.  He has had an occasional atypical chest pain since last seen.  Otherwise, he denies exertional chest discomfort.  He denies shortness of breath, paroxysmal nocturnal dyspnea, lower extremity swelling or syncope.  He denies palpitations.   Prior CV studies:   The following studies were reviewed today:  Event monitor 02/22/2018  The patient was monitored for 657:42 hours, of which 625:25 hours yielded usable tracings.  The predominant rhythm was sinus with an average rate of 76 bpm.  There were frequent PAC's and multiple brief atrial runs.  Rare PVC's were observed.  There was no sustained arrhythmia or prolonged pause. Predominantly sinus rhythm with frequent PAC's and brief atrial runs.  Nuclear stress test 02/16/2018 No significant ischemia, EF 50; paroxysmal atrial fibrillation noted after administration of Lexiscan, but spontaneously  resolved.  Chest CTA 02/09/2018 IMPRESSION: 1. No evidence of acute aortic or branch vessel dissection. 2. Stable to incrementally increased aneurysmal dilatation of the tubular portion of the ascending thoracic aorta with a maximal diameter of 4.2 cm. Recommend annual imaging followup by CTA or MRA. 3. Stable ectasia of the aortic root with a maximal diameter of 4.8 cm. 4. Coronary artery calcifications.  ABIs 08/11/2017 Normal  Chest CTA 06/01/2017 4.1 cm ascending thoracic aortic aneurysm Coronary artery calcifications noted  Echocardiogram 04/14/17 EF 55-60, normal wall motion, grade 1 diastolic dysfunction, mild to moderate aortic root dilation (4 cm), moderately dilated ascending aorta (4.3 cm, aortic arch 3.3 cm), mild MR, normal RVSF  Chest CTA 12/07/2004 Vessels are well opacified and there is no evidence for a thoracic dissection or aneurysm. The pulmonary arteries are also well opacified and there is no evidence for pulmonary embolus. There is no mediastinal, hilar or axillary adenopathy. Lung windows show no nodules, pleural effusions or infiltrates. There is minimal bibasilar atelectasis but no evidence for interstitial fibrosis. The findings on the recent chest x-ray may be related to hypoinflation. Images of the upper abdomen are unremarkable.  IMPRESSION:  No evidence for acute abnormality of the chest. Past Medical History:  Diagnosis Date  . Anesthesia complication    prolonged sedation after colonoscopy  . Colon polyps    on colonoscopy 08/2010  . ED (erectile dysfunction)   . Essential hypertension 06/25/2008   Qualifier: Diagnosis of  By: Council Mechanic MD, Hilaria Ota   . GERD (gastroesophageal reflux disease)   .  HYPERLIPIDEMIA 07/24/2008   Qualifier: Diagnosis of  By: Council Mechanic MD, Hilaria Ota   . Hypersomnia 01/05/2017  . Near syncope 03/14/2017  . Neck pain 12/15/2011  . OSA (obstructive sleep apnea) 11/13/2016   Dr. Elsworth Soho  . Prostate cancer [facility])    s/p  prostatectomy  . Sleep apnea 2018  . Thoracic ascending aortic aneurysm ([place]) 04/17/2017   Chest CTA 10/18: Ascending thoracic aneurysm 4.1 cm, aortic root 4.5 cm >> follow-up 1 year // Echo 8/18:  - Ascending aorta: The ascending aorta was moderately dilated, 4.3 cm. Aortic arch is 3.3 cm   Surgical Hx: The patient  has a past surgical history that includes Esophagogastroduodenoscopy (10/02/2000); MCH: Dehydration; vasvagal sync (12/09/ 05/2003); Plantar fascia surgery (Bilateral); Robot assisted laparoscopic radical prostatectomy (N/A, 05/27/2013); Colonoscopy; Polypectomy; and Upper gastrointestinal endoscopy.   Current Medications: Current Meds  Medication Sig  . atorvastatin (LIPITOR) 20 MG tablet Take 1 tablet (20 mg total) by mouth daily.  . cholecalciferol (VITAMIN D) 1000 units tablet Take 1 tablet (1,000 Units total) by mouth daily.  Marland Kitchen losartan (COZAAR) 25 MG tablet TAKE 1 TABLET BY MOUTH EVERY DAY  . metoprolol succinate (TOPROL XL) 25 MG 24 hr tablet Take 1 tablet (25 mg total) by mouth at bedtime.  . nitroGLYCERIN (NITROSTAT) 0.4 MG SL tablet Place 1 tablet (0.4 mg total) under the tongue every 5 (five) minutes as needed for chest pain.  Marland Kitchen Omeprazole-Sodium Bicarbonate (ZEGERID OTC) 20-1100 MG CAPS Take 1 capsule by mouth daily before breakfast.     Allergies:   Ibuprofen   Social History   Tobacco Use  . Smoking status: Never Smoker  . Smokeless tobacco: Never Used  Substance Use Topics  . Alcohol use: No    Alcohol/week: 0.0 standard drinks    Comment: no  . Drug use: No     Family Hx: The patient's family history includes Stroke in his mother. There is no history of Prostate cancer, Colon cancer, Aortic dissection, Esophageal cancer, Colon polyps, Rectal cancer, or Stomach cancer.  ROS:   Please see the history of present illness.    ROS All other systems reviewed and are negative.   EKGs/Labs/Other Test Reviewed:    EKG:  EKG is not ordered today.    Recent  Labs: 04/13/2018: ALT 11; BUN 16; Creatinine, Ser 1.31; Magnesium 1.9; Potassium 5.2; Sodium 141; TSH 2.710   Recent Lipid Panel Lab Results  Component Value Date/Time   CHOL 150 04/13/2018 08:27 AM   TRIG 126 04/13/2018 08:27 AM   HDL 42 04/13/2018 08:27 AM   CHOLHDL 3.6 04/13/2018 08:27 AM   CHOLHDL 5 11/30/2017 10:57 AM   LDLCALC 83 04/13/2018 08:27 AM   LDLDIRECT 172.4 12/13/2012 08:08 AM    Physical Exam:    VS:  BP 110/80   Pulse 74   Ht 5\' 9"  (1.753 m)   Wt 206 lb 12.8 oz (93.8 kg)   SpO2 96%   BMI 30.54 kg/m     Wt Readings from Last 3 Encounters:  08/03/18 206 lb 12.8 oz (93.8 kg)  05/18/18 202 lb 4 oz (91.7 kg)  05/04/18 201 lb 12.8 oz (91.5 kg)     Physical Exam  Constitutional: He is oriented to person, place, and time. He appears well-developed and well-nourished. No distress.  HENT:  Head: Normocephalic and atraumatic.  Neck: Neck supple. No JVD present. No thyromegaly present.  Cardiovascular: Normal rate, regular rhythm, S1 normal and S2 normal.  No murmur heard. Pulmonary/Chest: Breath  sounds normal. He has no rales.  Abdominal: Soft. There is no hepatomegaly.  Musculoskeletal:        General: No edema.  Lymphadenopathy:    He has no cervical adenopathy.  Neurological: He is alert and oriented to person, place, and time.  Skin: Skin is warm and dry.  Psychiatric: He has a normal mood and affect.    ASSESSMENT & PLAN:    Premature atrial beats Symptoms are quiescent on current dose of beta-blocker.  Continue current therapy.  Gastroesophageal reflux disease, esophagitis presence not specified This seems to have been the cause for most of his chest discomfort.  His symptoms have improved.  Continue follow-up with primary care.  Paroxysmal atrial fibrillation ([place]) He had a brief episode noted on stress testing.  If he has a recurrence of atrial fibrillation in the future, he will need anticoagulation.  Coronary artery calcification seen on CT  scan No anginal symptoms.  Continue statin therapy.  With his long history of acid reflux disease, I am not certain that aspirin would be a safe therapy for him.  If his symptoms are fairly stable over time, we could consider adding low-dose aspirin at next visit.  Thoracic ascending aortic aneurysm [facility]) Follow-up chest CTA is due in June 2020.  I will have him follow-up with Dr. Burt Knack after his scan is complete.   Dispo:  Return in about 6 months (around 02/02/2019) for Routine Follow Up, w/ Dr. Burt Knack.   Medication Adjustments/Labs and Tests Ordered: Current medicines are reviewed at length with the patient today.  Concerns regarding medicines are outlined above.  Tests Ordered: Orders Placed This Encounter  Procedures  . CT ANGIO CHEST AORTA W &/OR WO CONTRAST  . Basic metabolic panel   Medication Changes: No orders of the defined types were placed in this encounter.   Signed, Richardson Dopp, PA-C  08/03/2018 2:07 PM    [facility] [place], [place], [place]  [address] Phone: [phone]; Fax: [phone]

## 2018-08-03 NOTE — Patient Instructions (Addendum)
Medication Instructions:  Your physician recommends that you continue on your current medications as directed. Please refer to the Current Medication list given to you today.  If you need a refill on your cardiac medications before your next appointment, please call your pharmacy.   Lab work: TO BE DONE 1 WEEK BEFORE  CTA: BMET  If you have labs (blood work) drawn today and your tests are completely normal, you will receive your results only by: Marland Kitchen MyChart Message (if you have MyChart) OR . A paper copy in the mail If you have any lab test that is abnormal or we need to change your treatment, we will call you to review the results.  Testing/Procedures: CTA NEEDS TO BE SCHEDULED FOR 01/2019   Follow-Up: At [facility], you and your health needs are our priority.  As part of our continuing mission to provide you with exceptional heart care, we have created designated Provider Care Teams.  These Care Teams include your primary Cardiologist (physician) and Advanced Practice Providers (APPs -  Physician Assistants and Nurse Practitioners) who all work together to provide you with the care you need, when you need it. You will need a follow up appointment in:  6 months AFTER CTA.  Please call our office 2 months in advance to schedule this appointment.  You may see Sherren Mocha, MD or one of the following Advanced Practice Providers on your designated Care Team: Richardson Dopp, PA-C [place], [place] . Daune Perch, NP  Any Other Special Instructions Will Be Listed Below (If Applicable).

## 2018-08-24 ENCOUNTER — Ambulatory Visit (INDEPENDENT_AMBULATORY_CARE_PROVIDER_SITE_OTHER): Payer: Medicare Other | Admitting: Family Medicine

## 2018-08-24 ENCOUNTER — Encounter: Payer: Self-pay | Admitting: Family Medicine

## 2018-08-24 DIAGNOSIS — I10 Essential (primary) hypertension: Secondary | ICD-10-CM

## 2018-08-24 DIAGNOSIS — H6120 Impacted cerumen, unspecified ear: Secondary | ICD-10-CM | POA: Diagnosis not present

## 2018-08-24 NOTE — Patient Instructions (Addendum)
Stop losartan and see if the cough is better.   Check your BP at home and update me as needed.  Goal BP ~120/~80.    Update me as needed.   Your eardrum looks normal.  Take care.  Glad to see you.

## 2018-08-24 NOTE — Progress Notes (Signed)
He isn't lightheaded.  Lower BP noted.  He had occ cough he attributed to losartan.    L ear feels stuffy.  Started with L ear itching.  He had used a qtip on L ear.  Now with stuffy feeling in L ear.  No R sided sx.  No FCNAVD.  He feels well o/w.    Meds, vitals, and allergies reviewed.   ROS: Per HPI unless specifically indicated in ROS section   Not lightheaded on standing.    nad ncat L ear wax impaction.  Removal with curette and irrigation. Recheck wnl, normal canal and TM.  Tolerated well.  No complications..  rrr ctab abd soft Ext w/o edema.

## 2018-08-26 DIAGNOSIS — H6120 Impacted cerumen, unspecified ear: Secondary | ICD-10-CM | POA: Insufficient documentation

## 2018-08-26 NOTE — Assessment & Plan Note (Signed)
Stop losartan and he'll see if the cough is better.   Check BP at home and update me as needed.  Goal BP ~120/~80.   He agrees.

## 2018-08-26 NOTE — Assessment & Plan Note (Signed)
Removed successfully.  See above.  Update me as needed.

## 2018-12-06 ENCOUNTER — Ambulatory Visit: Payer: Medicare Other

## 2018-12-07 ENCOUNTER — Encounter: Payer: Medicare Other | Admitting: Family Medicine

## 2019-02-01 ENCOUNTER — Telehealth: Payer: Self-pay | Admitting: *Deleted

## 2019-02-01 NOTE — Telephone Encounter (Signed)
    COVID-19 Pre-Screening Questions:  . In the past 7 to 10 days have you had a cough,  shortness of breath, headache, congestion, fever (100 or greater) body aches, chills, sore throat, or sudden loss of taste or sense of smell? . Have you been around anyone with known Covid 19. . Have you been around anyone who is awaiting Covid 19 test results in the past 7 to 10 days? . Have you been around anyone who has been exposed to Covid 19, or has mentioned symptoms of Covid 19 within the past 7 to 10 days?  If you have any concerns/questions about symptoms patients report during screening (either on the phone or at threshold). Contact the provider seeing the patient or DOD for further guidance.  If neither are available contact a member of the leadership team.            Contacted patient via telephone call all no answers to Covid 19 questions. Has a mask.KB

## 2019-02-04 ENCOUNTER — Other Ambulatory Visit: Payer: Self-pay

## 2019-02-04 ENCOUNTER — Other Ambulatory Visit: Payer: Medicare Other | Admitting: *Deleted

## 2019-02-04 ENCOUNTER — Encounter (INDEPENDENT_AMBULATORY_CARE_PROVIDER_SITE_OTHER): Payer: Self-pay

## 2019-02-04 DIAGNOSIS — I7121 Aneurysm of the ascending aorta, without rupture: Secondary | ICD-10-CM

## 2019-02-04 DIAGNOSIS — I712 Thoracic aortic aneurysm, without rupture: Secondary | ICD-10-CM

## 2019-02-05 LAB — BASIC METABOLIC PANEL
BUN/Creatinine Ratio: 15 (ref 10–24)
BUN: 19 mg/dL (ref 8–27)
CO2: 23 mmol/L (ref 20–29)
Calcium: 9.5 mg/dL (ref 8.6–10.2)
Chloride: 101 mmol/L (ref 96–106)
Creatinine, Ser: 1.3 mg/dL — ABNORMAL HIGH (ref 0.76–1.27)
GFR calc Af Amer: 64 mL/min/{1.73_m2} (ref >59)
GFR calc non Af Amer: 56 mL/min/{1.73_m2} — ABNORMAL LOW (ref >59)
Glucose: 137 mg/dL — ABNORMAL HIGH (ref 65–99)
Potassium: 4.4 mmol/L (ref 3.5–5.2)
Sodium: 139 mmol/L (ref 134–144)

## 2019-02-08 ENCOUNTER — Telehealth: Payer: Self-pay | Admitting: *Deleted

## 2019-02-08 NOTE — Telephone Encounter (Signed)

## 2019-02-11 ENCOUNTER — Ambulatory Visit (INDEPENDENT_AMBULATORY_CARE_PROVIDER_SITE_OTHER)
Admission: RE | Admit: 2019-02-11 | Discharge: 2019-02-11 | Disposition: A | Discharge status: Home / Self Care | Payer: Medicare Other | Source: Ambulatory Visit | Attending: Physician Assistant | Admitting: Physician Assistant

## 2019-02-11 ENCOUNTER — Other Ambulatory Visit: Payer: Self-pay

## 2019-02-11 DIAGNOSIS — I712 Thoracic aortic aneurysm, without rupture: Secondary | ICD-10-CM | POA: Diagnosis not present

## 2019-02-11 DIAGNOSIS — I7121 Aneurysm of the ascending aorta, without rupture: Secondary | ICD-10-CM

## 2019-02-11 IMAGING — CT CT ANGIOGRAPHY CHEST
2 of 7 series · 18 of 46 positions shown · IV contrast (OMNIPAQUE 350)
Comparison: [DATE], [DATE]

CLINICAL DATA: Follow-up [facility]

EXAM:
CT ANGIOGRAPHY CHEST WITH CONTRAST
TECHNIQUE: Multidetector CT imaging of the chest was performed using the
standard protocol during bolus administration of intravenous
contrast. Multiplanar CT image reconstructions and MIPs were
obtained to evaluate the vascular anatomy.
CONTRAST:  100mL OMNIPAQUE IOHEXOL 300 MG/ML  SOLN

[Series 4: aorta 3.0 i31f 2 · axial · 0.76mm/px · z∈[-304,-40]mm · 15 of 96 slices shown]
[im 4/96  lung]
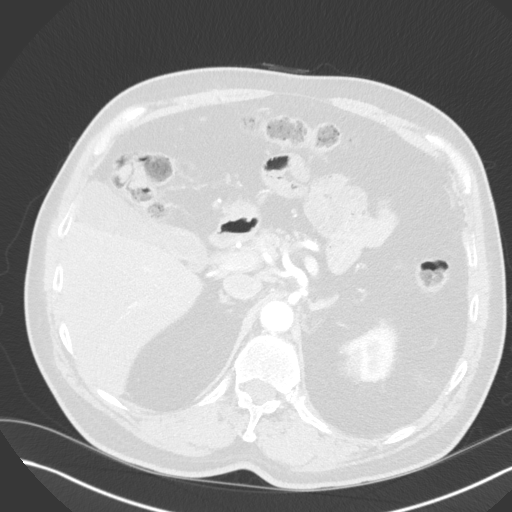
[im 11/96  soft-tissue]
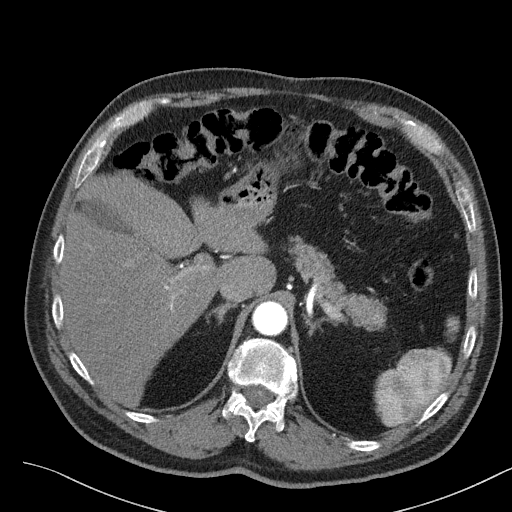
[im 19/96  lung]
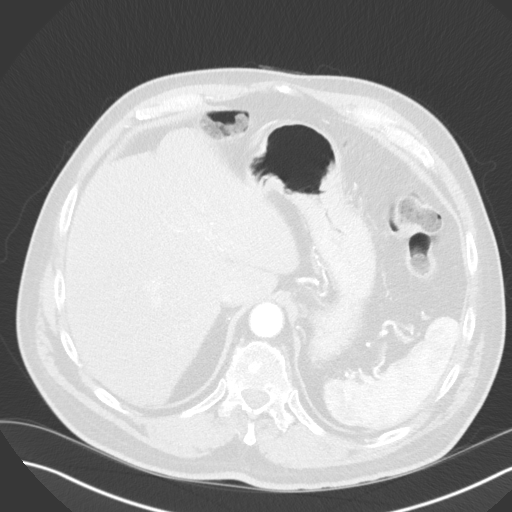
[im 22/96  soft-tissue]
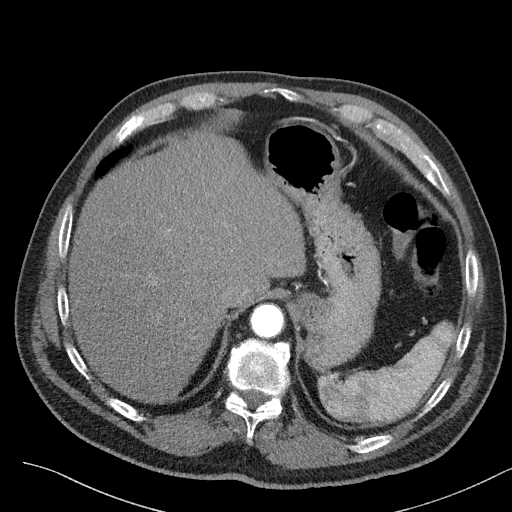
[im 30/96  lung]
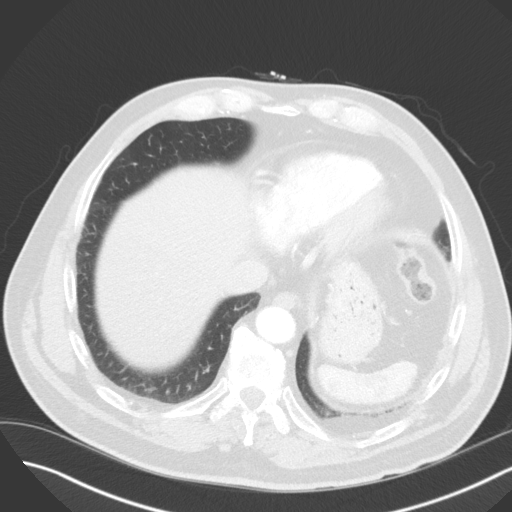
[im 37/96  soft-tissue]
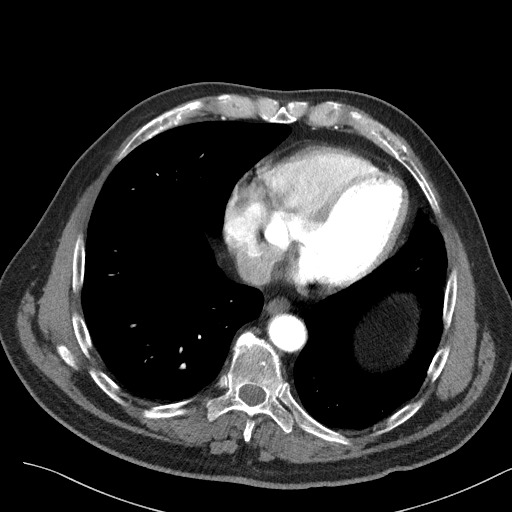
[im 41/96  lung]
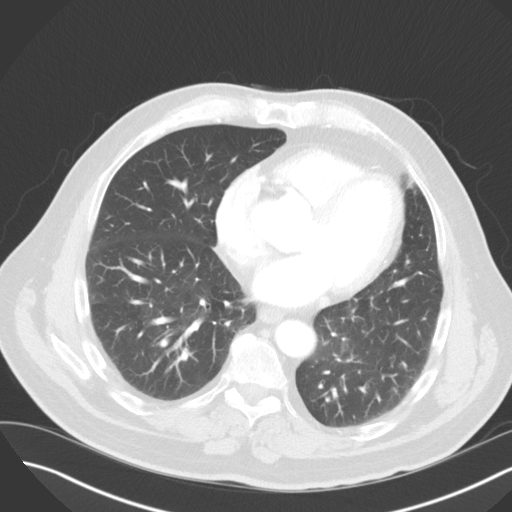
[im 48/96  soft-tissue]
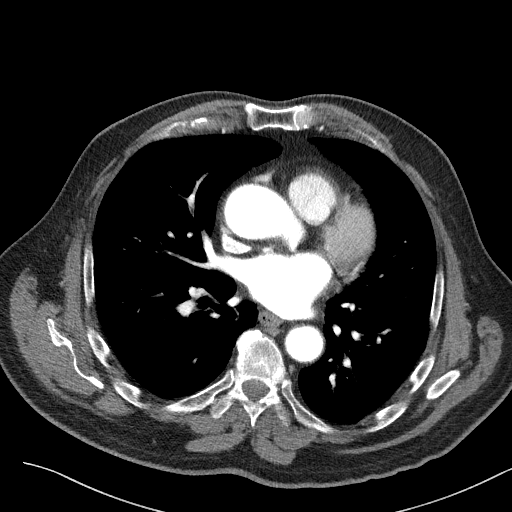
[im 55/96  lung]
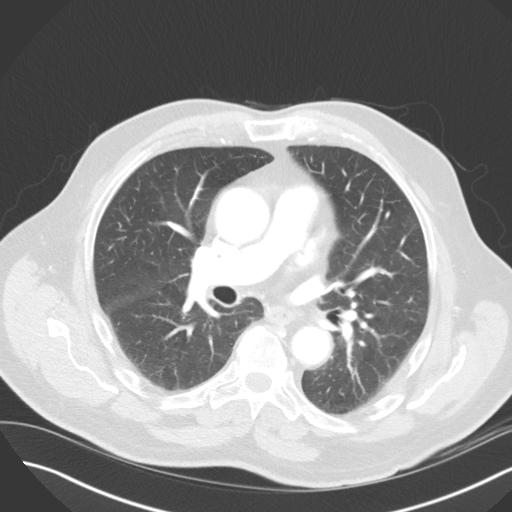
[im 59/96  soft-tissue]
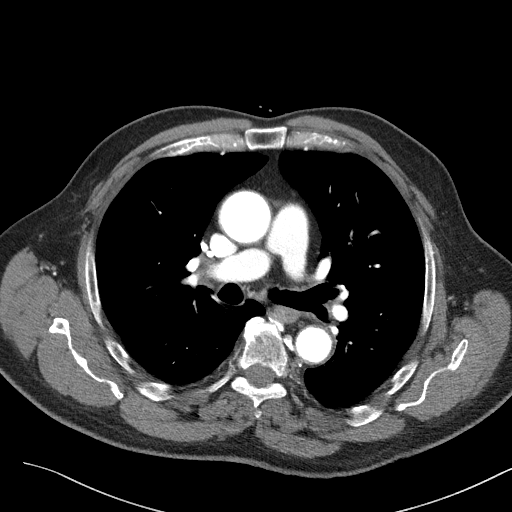
[im 66/96  lung]
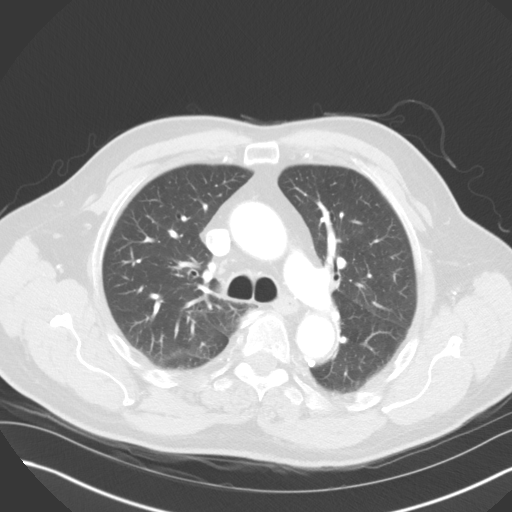
[im 74/96  soft-tissue]
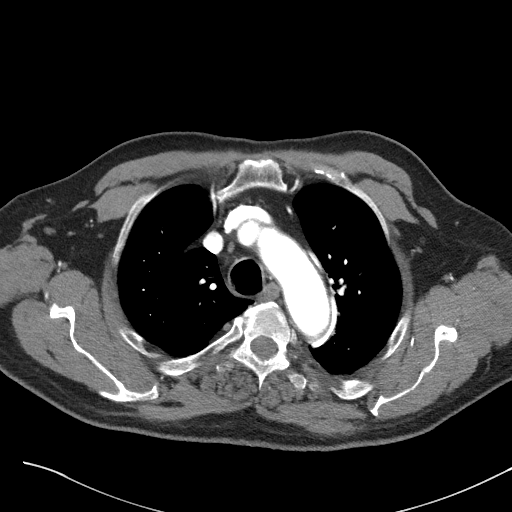
[im 77/96  lung]
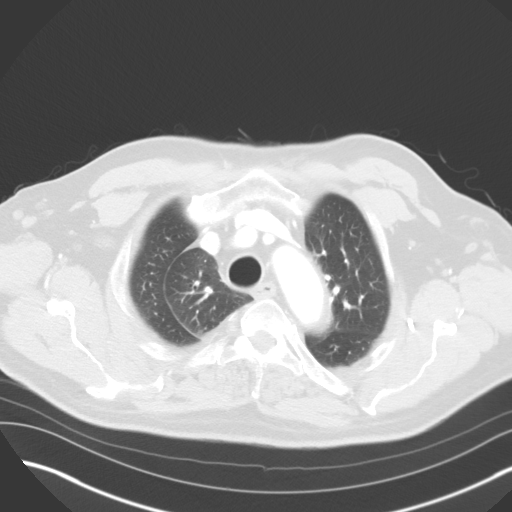
[im 85/96  soft-tissue]
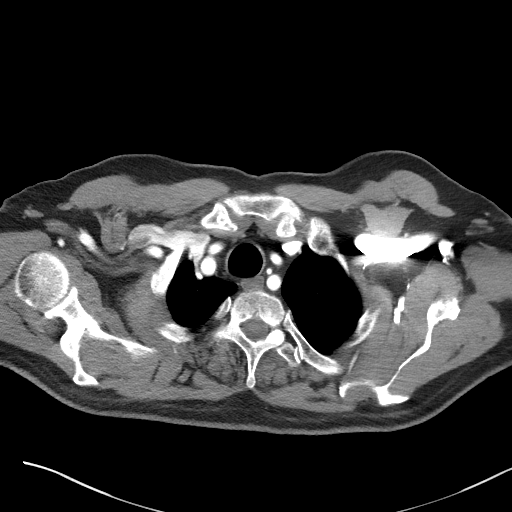
[im 92/96  lung]
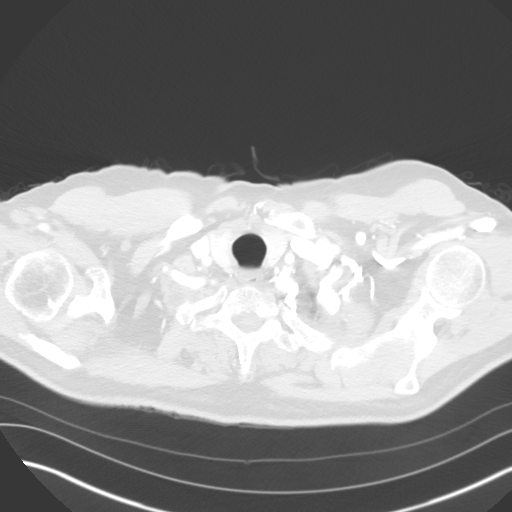

[Series 7: coronals · coronal · 0.58mm/px · 3 of 148 slices shown]
[im 37/148  soft-tissue]
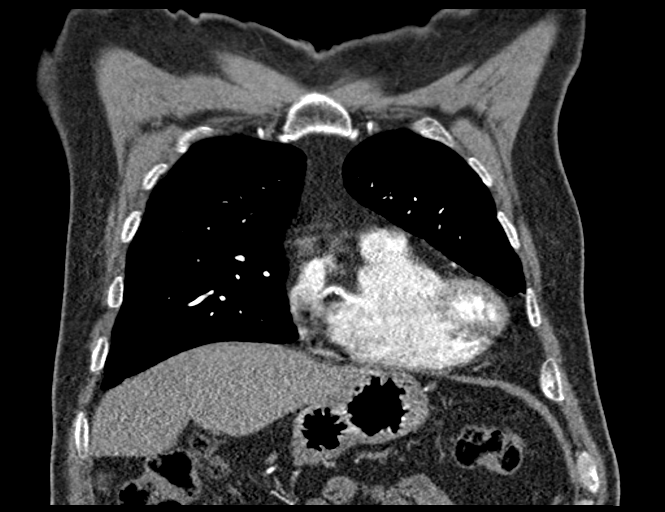
[im 74/148  soft-tissue]
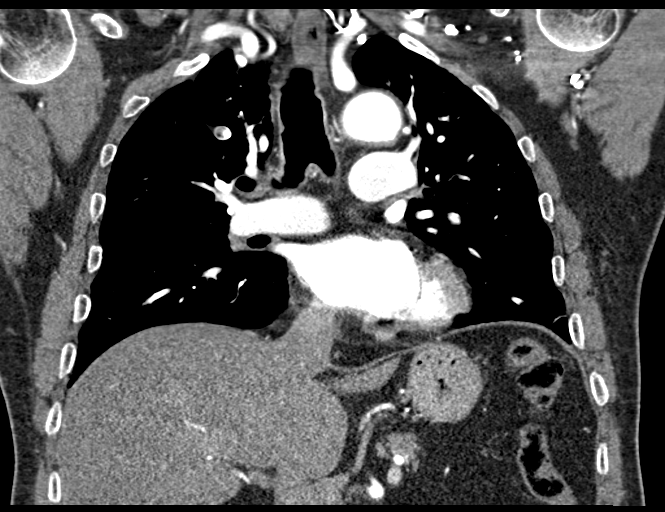
[im 111/148  soft-tissue]
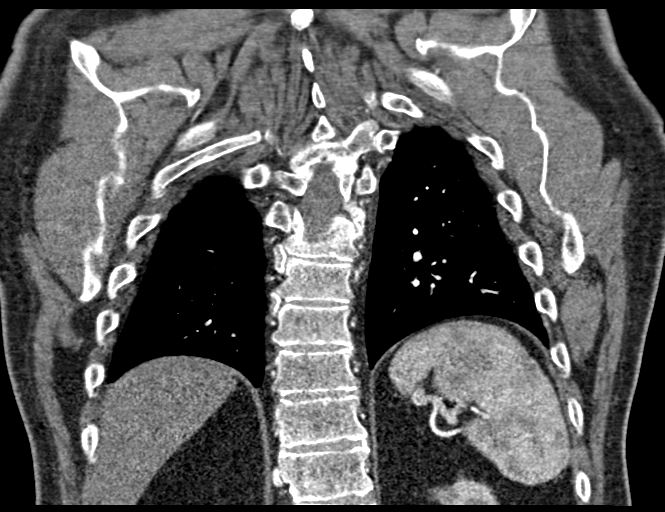

[18 of 46 positions shown; findings below may reference images not displayed]

FINDINGS: Cardiovascular: Preferential opacification of the thoracic aorta.
The tubular ascending aorta measures 3.8 x 3.7 cm, unchanged on
examinations dating back to [DATE] when measured similarly. The
aortic valve measures 2.5 cm and the sinuses of Valsalva measure
cm, likewise unchanged. The aortic arch and descending thoracic
aorta are normal in caliber. No significant atherosclerosis. Normal
heart size. Left coronary artery calcifications. No pericardial
effusion.

Mediastinum/Nodes: No enlarged mediastinal, hilar, or axillary lymph
nodes. Thyroid gland, trachea, and esophagus demonstrate no
significant findings.

Lungs/Pleura: Stable, benign small pulmonary nodules, for example a
5 mm nodule of the right lower lobe (series 5, image 60). No pleural
effusion or pneumothorax.

Upper Abdomen: No acute abnormality.

Musculoskeletal: No chest wall abnormality. No acute or significant
osseous findings.

Review of the MIP images confirms the above findings.
IMPRESSION: 1. The tubular ascending aorta measures 3.8 x 3.7 cm, unchanged on
examinations dating back to [DATE] when measured similarly. The
aortic valve measures 2.5 cm and the sinuses of Valsalva measure
cm, likewise unchanged. The aortic arch and descending thoracic
aorta are normal in caliber. No significant atherosclerosis.

2.  Coronary artery disease.

3.  Stable, benign small pulmonary nodules.

## 2019-02-11 MED ORDER — IOHEXOL 300 MG/ML  SOLN
100.0000 mL | Freq: Once | INTRAMUSCULAR | Status: AC | PRN
  Administered 2019-02-11: 100 mL via INTRAVENOUS

## 2019-02-12 ENCOUNTER — Encounter: Payer: Self-pay | Admitting: Physician Assistant

## 2019-02-13 ENCOUNTER — Other Ambulatory Visit: Payer: Self-pay | Admitting: *Deleted

## 2019-02-13 ENCOUNTER — Telehealth: Payer: Self-pay | Admitting: *Deleted

## 2019-02-13 DIAGNOSIS — Z9189 Other specified personal risk factors, not elsewhere classified: Secondary | ICD-10-CM

## 2019-02-13 NOTE — Telephone Encounter (Signed)
Lvm  To callback about CT results

## 2019-02-13 NOTE — Telephone Encounter (Signed)
Patient is returning call for results. 

## 2019-02-20 ENCOUNTER — Telehealth: Payer: Self-pay

## 2019-02-20 NOTE — Telephone Encounter (Signed)
Left message on vm per dpr asking pt to call back.  Need an update on pt, per Dr. Darnell Level.

## 2019-02-20 NOTE — Telephone Encounter (Signed)
Pt having H/A on left side of head on and off; tylenol not helping this AM. Dry cough on and off; pt has no way to ck vital signs; no dizziness,CP,SOBno other covid symptoms. Pt will go to [facility] in [place]. FYI to Dr Darnell Level.

## 2019-02-20 NOTE — Telephone Encounter (Signed)
Noted. plz call this afternoon for an update.

## 2019-03-08 ENCOUNTER — Encounter: Payer: Self-pay | Admitting: Gastroenterology

## 2019-03-11 ENCOUNTER — Encounter: Payer: Self-pay | Admitting: Gastroenterology

## 2019-04-15 ENCOUNTER — Ambulatory Visit: Payer: Self-pay | Admitting: *Deleted

## 2019-04-15 NOTE — Telephone Encounter (Signed)
I spoke with pt and he is at work and does not plan to go to ED; advised concern for stroke or heart attack ; pt said it is not that serious and wants appt with Dr Damita Dunnings; Pt has no covid symptoms, no travel and was around a coworker that tested positive but pt had covid test that was negative.pt still has weakness in lt foot and leg; pt refuses to go to ED unless gets worse and then pt will go; advised pt sometimes the body gives [place] warning signs and should go for eval and possible imaging. Pt said again if gets worse will go to ED; offered appt today with a different provider and pt said no wants to see Dr Damita Dunnings. Pt given appt with Dr Damita Dunnings on 04/16/19 at 10:30. Again ED precautions given and pt voiced understanding. FYI to Dr Damita Dunnings.

## 2019-04-15 NOTE — Telephone Encounter (Signed)
Would still advise ER.  Make sure he knows that is the primary advice.  Thanks.

## 2019-04-15 NOTE — Telephone Encounter (Signed)
Pt notified as instructed by Dr Damita Dunnings and pt said he still is not going to go to ED and will see Dr Damita Dunnings at appt on 04/16/19. Again emphasized pt needs to go to ED for evaluation per instructions of Dr Damita Dunnings and pt nicely declined. FYI to Dr Damita Dunnings.

## 2019-04-15 NOTE — Telephone Encounter (Signed)
Pt reports "Last Thursday (04/10/2019) left foot felt weak, and I couldn't bend it up."  Still unable to dorsiflex  left foot. States entire left leg feels a little weaker. "And sometimes my left foot with flop when I'm walking." Also reports had 2 brief episodes of left sided chest pain that day. Denies any back pain. No headache, no dizziness or visual changes. Denies any facial droop, speech non- halting during call. Declines ED, "I'm at work, I'm doing everything I normally do." Assured pt [place] would route to practice for Dr. Josefine Class review. Aware he may advise ED. Care advise given per protocol, pt verbalizes understanding.  Please advise CB# [phone]  Reason for Disposition . [1] Weakness of the face, arm / hand, or leg / foot on one side of the body AND [2] gradual onset (e.g., days to weeks) AND [3] present now  Answer Assessment - Initial Assessment Questions 1. SYMPTOM: "What is the main symptom you are concerned about?" (e.g., weakness, numbness)     Left foot weak, can not  2. ONSET: "When did this start?" (minutes, hours, days; while sleeping)     Last Thursday 3. LAST NORMAL: "When was the last time you were normal (no symptoms)?"    8/ 26/2020 4. PATTERN "Does this come and go, or has it been constant since it started?"  "Is it present now?"     Present now 5. CARDIAC SYMPTOMS: "Have you had any of the following symptoms: chest pain, difficulty breathing, palpitations?"     Had 2 brief episodes of left sided chest pain  6. NEUROLOGIC SYMPTOMS: "Have you had any of the following symptoms: headache, dizziness, vision loss, double vision, changes in speech, unsteady on your feet?"     Not as much feeling in my left foot, left leg feels weak. Left foot "Flops sometimes when walking."   7. OTHER SYMPTOMS: "Do you have any other symptoms?"     No  Protocols used: NEUROLOGIC DEFICIT-A-AH

## 2019-04-15 NOTE — Telephone Encounter (Signed)
Noted. Thanks.

## 2019-04-16 ENCOUNTER — Ambulatory Visit (INDEPENDENT_AMBULATORY_CARE_PROVIDER_SITE_OTHER): Payer: Medicare Other

## 2019-04-16 ENCOUNTER — Telehealth: Payer: Self-pay

## 2019-04-16 ENCOUNTER — Ambulatory Visit
Admission: RE | Admit: 2019-04-16 | Discharge: 2019-04-16 | Disposition: A | Discharge status: Home / Self Care | Payer: Medicare Other | Source: Ambulatory Visit | Attending: Family Medicine | Admitting: Family Medicine

## 2019-04-16 ENCOUNTER — Other Ambulatory Visit: Payer: Self-pay

## 2019-04-16 ENCOUNTER — Encounter: Payer: Self-pay | Admitting: Neurology

## 2019-04-16 ENCOUNTER — Ambulatory Visit (INDEPENDENT_AMBULATORY_CARE_PROVIDER_SITE_OTHER): Payer: Medicare Other | Admitting: Family Medicine

## 2019-04-16 ENCOUNTER — Encounter: Payer: Self-pay | Admitting: Family Medicine

## 2019-04-16 VITALS — BP 118/80 | HR 56 | Temp 97.9°F | Resp 18 | Ht 69.0 in | Wt 209.1 lb

## 2019-04-16 DIAGNOSIS — I639 Cerebral infarction, unspecified: Secondary | ICD-10-CM

## 2019-04-16 DIAGNOSIS — D329 Benign neoplasm of meninges, unspecified: Secondary | ICD-10-CM

## 2019-04-16 DIAGNOSIS — R531 Weakness: Secondary | ICD-10-CM

## 2019-04-16 LAB — COMPREHENSIVE METABOLIC PANEL
ALT: 26 U/L (ref 0–53)
AST: 35 U/L (ref 0–37)
Albumin: 4.1 g/dL (ref 3.5–5.2)
Alkaline Phosphatase: 147 U/L — ABNORMAL HIGH (ref 39–117)
BUN: 16 mg/dL (ref 6–23)
CO2: 29 mEq/L (ref 19–32)
Calcium: 9.9 mg/dL (ref 8.4–10.5)
Chloride: 102 mEq/L (ref 96–112)
Creatinine, Ser: 1.35 mg/dL (ref 0.40–1.50)
GFR: 52.36 mL/min — ABNORMAL LOW (ref >60.00)
Glucose, Bld: 93 mg/dL (ref 70–99)
Potassium: 4.8 mEq/L (ref 3.5–5.1)
Sodium: 139 mEq/L (ref 135–145)
Total Bilirubin: 0.7 mg/dL (ref 0.2–1.2)
Total Protein: 7.1 g/dL (ref 6.0–8.3)

## 2019-04-16 LAB — LIPID PANEL
Cholesterol: 163 mg/dL (ref 0–200)
HDL: 42.2 mg/dL (ref >39.00)
LDL Cholesterol: 96 mg/dL (ref 0–99)
NonHDL: 121.2
Total CHOL/HDL Ratio: 4
Triglycerides: 128 mg/dL (ref 0.0–149.0)
VLDL: 25.6 mg/dL (ref 0.0–40.0)

## 2019-04-16 LAB — CBC WITH DIFFERENTIAL/PLATELET
Basophils Absolute: 0 10*3/uL (ref 0.0–0.1)
Basophils Relative: 0.5 % (ref 0.0–3.0)
Eosinophils Absolute: 0.2 10*3/uL (ref 0.0–0.7)
Eosinophils Relative: 2.3 % (ref 0.0–5.0)
HCT: 45.2 % (ref 39.0–52.0)
Hemoglobin: 15 g/dL (ref 13.0–17.0)
Lymphocytes Relative: 28.9 % (ref 12.0–46.0)
Lymphs Abs: 2.6 10*3/uL (ref 0.7–4.0)
MCHC: 33.3 g/dL (ref 30.0–36.0)
MCV: 88.3 fl (ref 78.0–100.0)
Monocytes Absolute: 1.3 10*3/uL — ABNORMAL HIGH (ref 0.1–1.0)
Monocytes Relative: 14.8 % — ABNORMAL HIGH (ref 3.0–12.0)
Neutro Abs: 4.8 10*3/uL (ref 1.4–7.7)
Neutrophils Relative %: 53.5 % (ref 43.0–77.0)
Platelets: 247 10*3/uL (ref 150.0–400.0)
RBC: 5.12 Mil/uL (ref 4.22–5.81)
RDW: 13.7 % (ref 11.5–15.5)
WBC: 8.9 10*3/uL (ref 4.0–10.5)

## 2019-04-16 LAB — TSH: TSH: 1.8 u[IU]/mL (ref 0.35–4.50)

## 2019-04-16 IMAGING — MR MR HEAD W/O CM
14 series · 44 of 48 positions shown · non-contrast
Comparison: No pertinent prior studies available for comparison.

CLINICAL DATA: Focal neuro deficit, greater than 6 hours, stroke
suspected. Additional history: Left foot and leg weakness for 1
week, evaluate for CVA.

EXAM:
MRI HEAD WITHOUT CONTRAST
TECHNIQUE: Multiplanar, multiecho pulse sequences of the brain and surrounding
structures were obtained without intravenous contrast.

[Series 5: ax dwi_tracew · axial · 3.0mm · 0.60mm/px · z∈[-64,+86]mm · 3 of 48 slices shown]
[im 1/48]
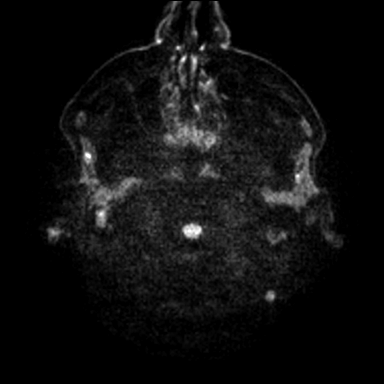
[im 24/48]
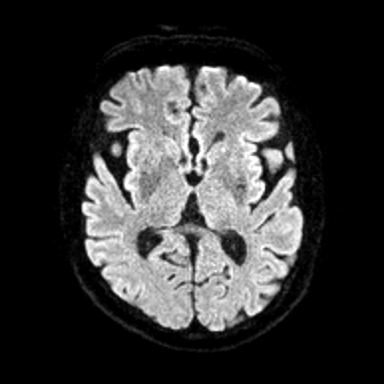
[im 48/48]
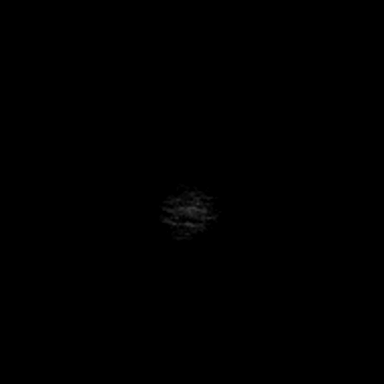

[Series 6: ax dwi_adc · axial · 3.0mm · 0.60mm/px · z∈[-64,+86]mm · 3 of 48 slices shown]
[im 1/48]
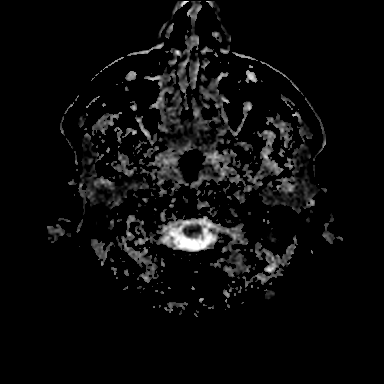
[im 24/48]
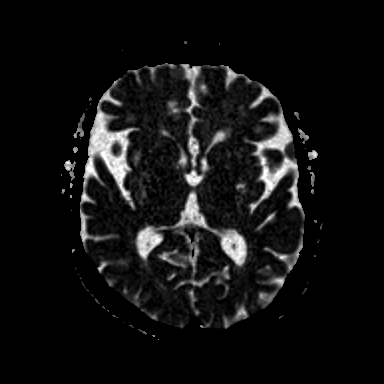
[im 48/48]
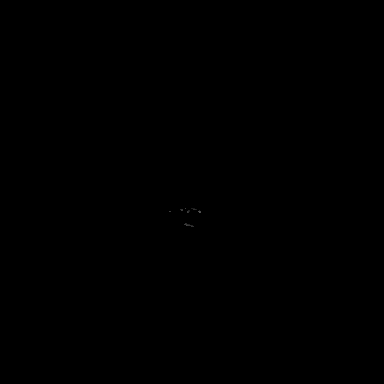

[Series 7: cor dwi_tracew · coronal · 5.0mm · 0.60mm/px · 2 of 38 slices shown (1 of 2)]
[im 1/38]
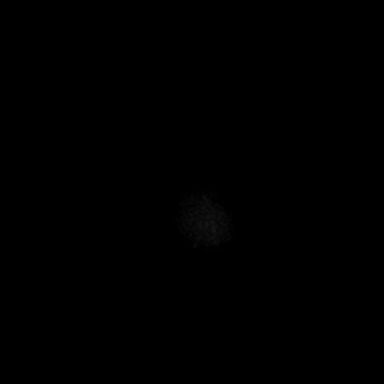
[im 38/38]
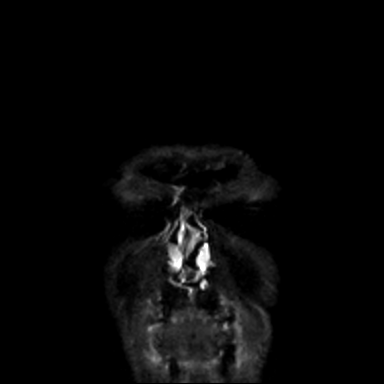

[Series 7: cor dwi_tracew · coronal · 5.0mm · 0.60mm/px · 2 of 38 slices shown (2 of 2)]
[im 1/38]
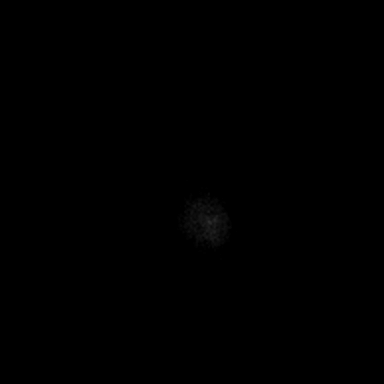
[im 38/38]
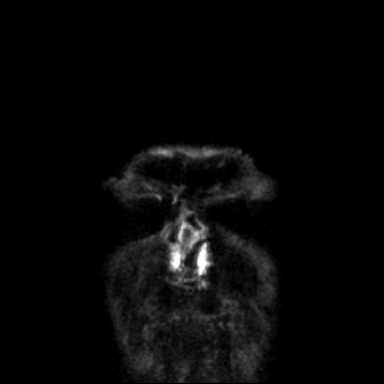

[Series 8: cor dwi_adc · coronal · 5.0mm · 0.60mm/px · 2 of 37 slices shown]
[im 1/37]
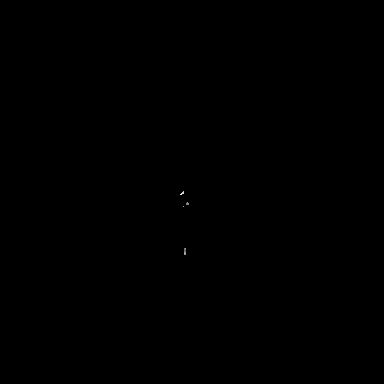
[im 37/37]
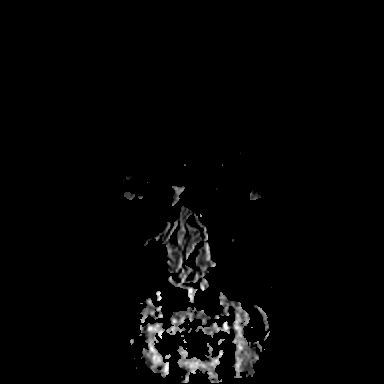

[Series 9: T1 · sagittal · 5.0mm · 0.62mm/px · 2 of 25 slices shown (1 of 2)]
[im 1/25]
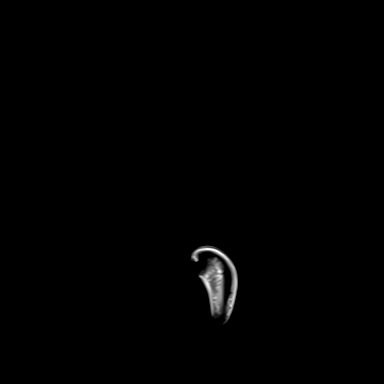
[im 25/25]
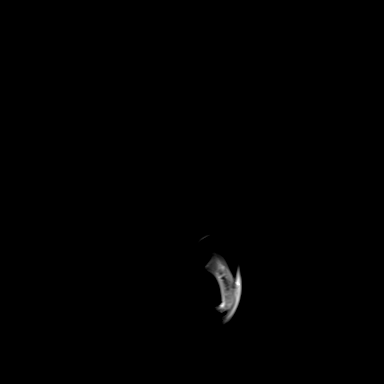

[Series 10: T2 · axial · 5.0mm · 0.53mm/px · z∈[-62,+76]mm · 2 of 25 slices shown (1 of 2)]
[im 1/25]
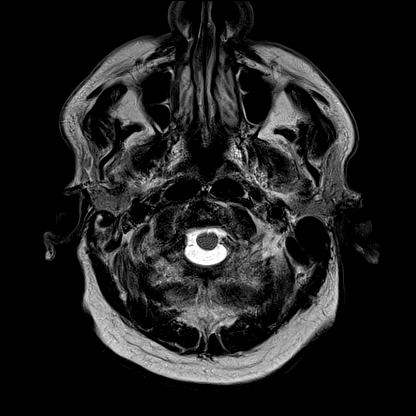
[im 25/25]
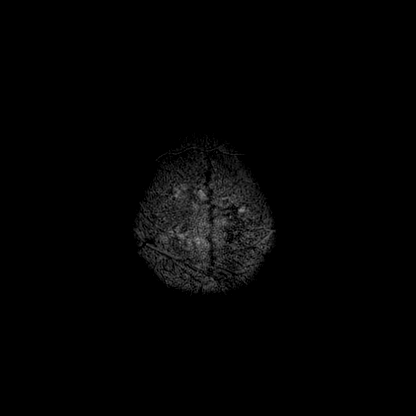

[Series 11: mag_images · axial · 3.0mm · 0.90mm/px · z∈[-77,+94]mm · 4 of 60 slices shown]
[im 1/60]
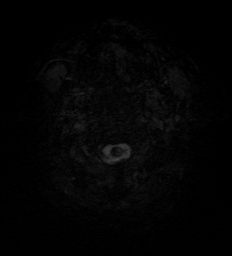
[im 20/60]
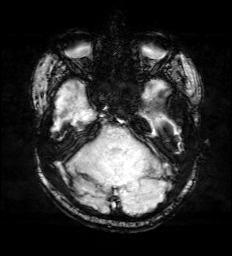
[im 40/60]
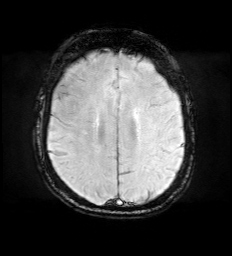
[im 60/60]
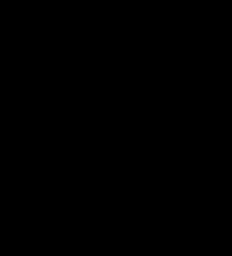

[Series 12: pha_images · axial · 3.0mm · 0.90mm/px · z∈[-77,+94]mm · 4 of 58 slices shown]
[im 1/58]
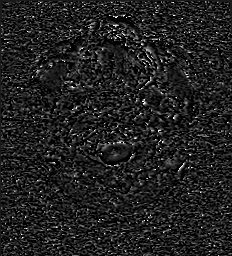
[im 20/58]
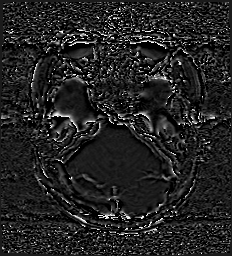
[im 39/58]
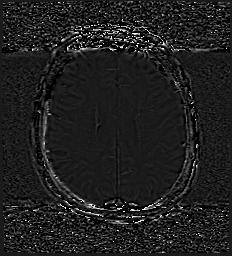
[im 58/58]
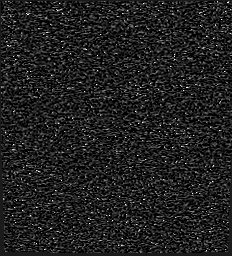

[Series 13: swi_images · axial · 3.0mm · 0.90mm/px · z∈[-77,+94]mm · 4 of 60 slices shown]
[im 1/60]
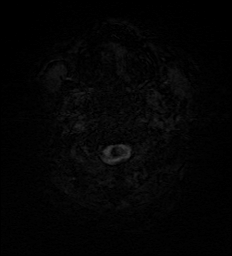
[im 20/60]
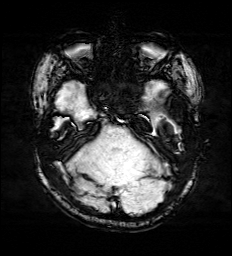
[im 40/60]
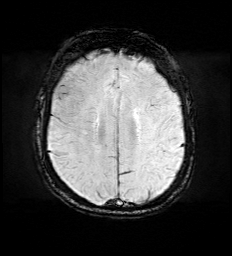
[im 60/60]
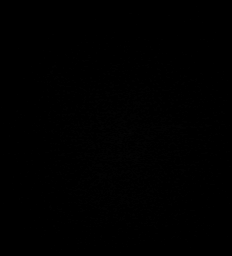

[Series 14: mip_images(sw) · axial · 24.0mm · 0.90mm/px · z∈[-67,+9]mm · 2 of 53 slices shown]
[im 1/53]
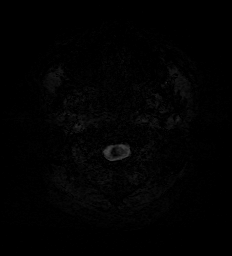
[im 27/53]
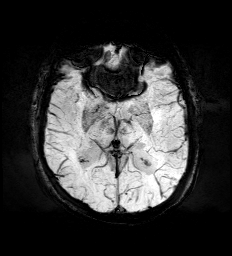

[Series 15: FLAIR · axial · 3.0mm · 0.53mm/px · z∈[-71,+85]mm · 4 of 55 slices shown]
[im 1/55]
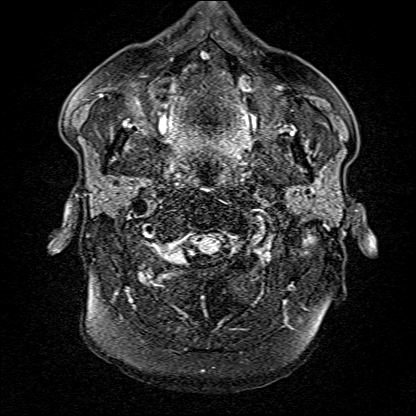
[im 19/55]
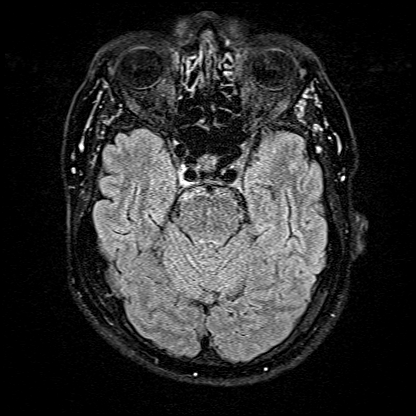
[im 37/55]
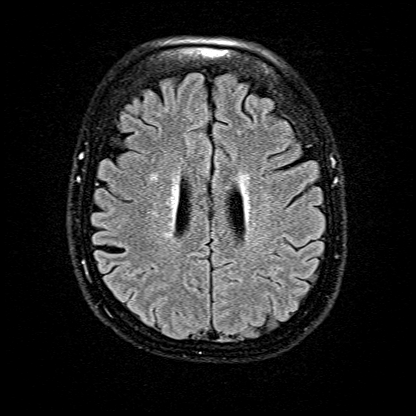
[im 55/55]
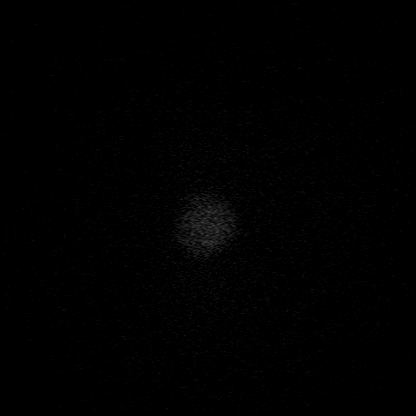

[Series 16: T1 · axial · 1.0mm · 0.98mm/px · z∈[-71,+98]mm · 8 of 176 slices shown (2 of 2)]
[im 1/176]
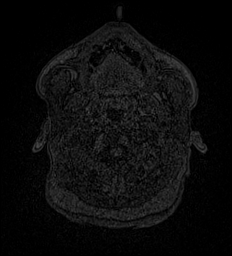
[im 36/176]
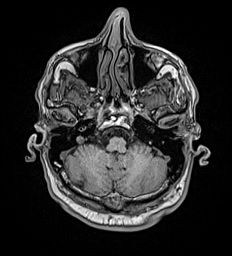
[im 53/176]
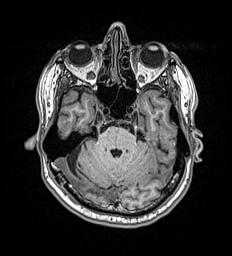
[im 71/176]
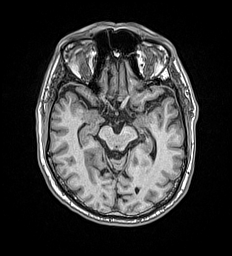
[im 106/176]
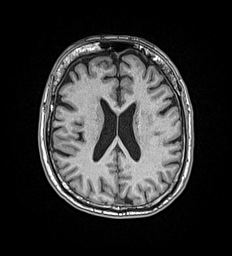
[im 123/176]
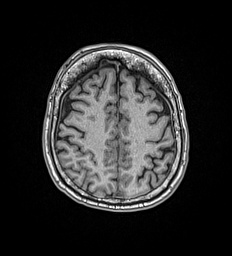
[im 141/176]
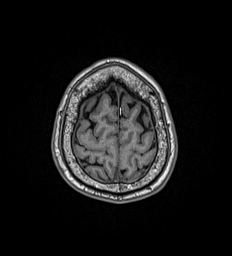
[im 176/176]
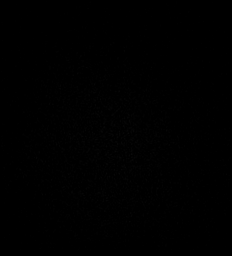

[Series 17: T2 · coronal · 5.0mm · 0.57mm/px · 2 of 29 slices shown (2 of 2)]
[im 1/29]
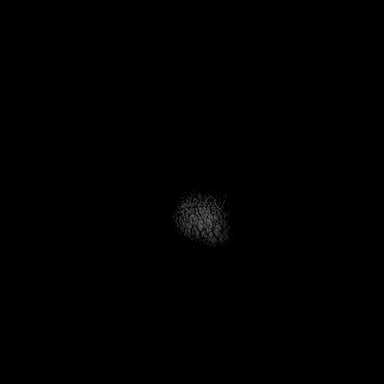
[im 29/29]
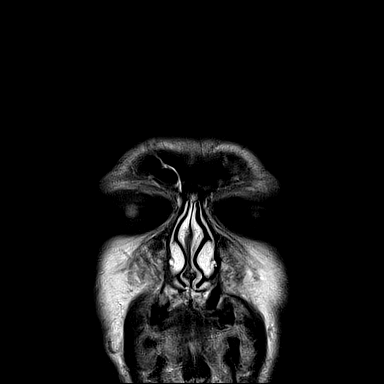

[44 of 48 positions shown; findings below may reference images not displayed]

FINDINGS: Brain: No restricted diffusion is demonstrated to suggest acute or
recent subacute infarction. No midline shift or extra-axial fluid
collection. No chronic intracranial hemorrhage. Mild generalized
parenchymal atrophy. Moderate scattered T2/FLAIR hyperintensity
within the cerebral white matter is nonspecific, but consistent with
chronic small vessel ischemic disease. There is an extra-axial,
dural-based FLAIR hyperintense lesion overlying the left frontal
operculum which measures 1.1 x 0.5 cm (series 15, image 29). Mild
generalized parenchymal atrophy.

Vascular: Flow voids maintained within the proximal large arterial
vessels.

Skull and upper cervical spine: Normal marrow signal.

Sinuses/Orbits: Imaged orbits demonstrate no acute abnormality. Mild
scattered paranasal sinus mucosal thickening. No significant mastoid
effusion.
IMPRESSION: No evidence of acute or recent subacute infarction.

1.1 cm dural-based extra-axial mass overlying the left frontal
operculum. This is favored to reflect an incidental small
meningioma. Consider contrast-enhanced MRI follow-up.

Mild generalized parenchymal atrophy with moderate chronic small
vessel ischemic disease.

## 2019-04-16 MED ORDER — ASPIRIN EC 81 MG PO TBEC: 81.0000 mg | DELAYED_RELEASE_TABLET | Freq: Every day | ORAL | Status: DC

## 2019-04-16 NOTE — Telephone Encounter (Signed)
Left message for patient to call back about MRI results

## 2019-04-16 NOTE — Progress Notes (Signed)
Per cardiology notes he has a history of paroxysmal atrial fibrillation.  He had a brief episode noted on stress testing.  The plan was to consider anticoagulation if he had a recurrence in the future.  He has strokelike symptoms described below but he does not have a known history of recent atrial fibrillation.  Symptoms noted 5 days ago.  Normal B foot plantar flexion but weaker L foot dorsiflexion with foot drop noted on walking, with his foot "flopping."  No R sided sx.  No hand symptoms previously noted by patient.  No double vision, no facial sx.  He had some L sided chest twinges last week, lasting a few seconds but this was very brief and self resolved.  Not SOB.  No chest twinges or symptoms or pain now.  He did not feel his heart racing.  He was previously advised to go to the emergency room but declined that.  He is here for evaluation today.  PMH and SH reviewed  ROS: Per HPI.  Unless specifically indicated otherwise in HPI, the patient denies:  General: fever. Eyes: acute vision changes ENT: sore throat Cardiovascular: chest pain Respiratory: SOB GI: vomiting GU: dysuria Musculoskeletal: acute back pain Derm: acute rash Neuro: acute motor dysfunction Psych: worsening mood Endocrine: polydipsia Heme: bleeding Allergy: hayfever  Meds, vitals, and allergies reviewed.   GEN: nad, alert and oriented HEENT: ncat EOMI PERRL no lip droop.  No eyelid droop. NECK: supple w/o LA CV: rrr.  Not irregular. PULM: ctab, no inc wob ABD: soft, +bs EXT: no edema SKIN: no acute rash CN 2-12 wnl B, S/S/DTR wnl x4 except for weakness on left-sided pincher grip and left-sided foot dorsiflexion.  He had not noticed his hand weakness prior to evaluation here.  EKG discussed with patient at office visit.

## 2019-04-16 NOTE — Telephone Encounter (Signed)
Patient returned call.  Please call patient back at [phone].

## 2019-04-16 NOTE — Patient Instructions (Addendum)
Presumed stroke.   Talk to [facility] on the way out.  If you have any new neurology symptoms, then dial 911 or go to the ER immediately.  I'll update cardiology.  Start taking aspirin 81mg  a day for now.  We may need to change this in the future.   Take care.  Glad to see you.

## 2019-04-16 NOTE — Telephone Encounter (Signed)
Patient advised.

## 2019-04-17 ENCOUNTER — Encounter: Payer: Self-pay | Admitting: Family Medicine

## 2019-04-17 ENCOUNTER — Other Ambulatory Visit: Payer: Self-pay | Admitting: Family Medicine

## 2019-04-17 DIAGNOSIS — R531 Weakness: Secondary | ICD-10-CM | POA: Insufficient documentation

## 2019-04-17 DIAGNOSIS — D329 Benign neoplasm of meninges, unspecified: Secondary | ICD-10-CM | POA: Insufficient documentation

## 2019-04-17 DIAGNOSIS — R748 Abnormal levels of other serum enzymes: Secondary | ICD-10-CM

## 2019-04-17 LAB — ECHOCARDIOGRAM COMPLETE
Height: 69 in
Weight: 3346 oz

## 2019-04-17 NOTE — Assessment & Plan Note (Addendum)
He has persistent left-sided weakness, typical for stroke symptoms.  He does not have any symptoms last 24 hours.  His symptoms have gotten some better since he noticed them initially about 5 days ago, per his report.  We will arrange for MRI, carotid ultrasound, echo.  See notes on labs.  Incidental likely meningioma noted on CT but no stroke seen. Carotids without obstruction. Echo unremarkable.  He has mild dilation of the aorta but he already has plans for follow-up imaging on that next year.  Mild alkaline phosphatase elevation noted.  We can recheck that and then if normal we can increase his statin given his lipids.  Given that he has strokelike symptoms without stroke seen on MRI, I need neurology input.  He does not have worsening symptoms in the meantime.  At this point still okay for outpatient follow-up.  Clearly advised if he has any new neurologic symptoms or worsening neurologic symptoms then to go to the emergency room or dial 911.  I will route this to cardiology in the meantime for input.  He did not have a new documented episode of A. fib with this episode.

## 2019-04-17 NOTE — Assessment & Plan Note (Signed)
Likely meningioma noted on MRI.  See notes on imaging.

## 2019-04-18 NOTE — Progress Notes (Signed)
thx Brigitte Pulse. Looks like he's seeing Dr Posey Pronto later this month for neuro evaluation. If he has evidence of an ischemic stroke I would have a low threshold to start him on oral anticoagulation with a DOAC since he apparently had a brief episode of AF during stress testing at some point.

## 2019-04-24 ENCOUNTER — Other Ambulatory Visit (INDEPENDENT_AMBULATORY_CARE_PROVIDER_SITE_OTHER): Payer: Medicare Other

## 2019-04-24 DIAGNOSIS — R748 Abnormal levels of other serum enzymes: Secondary | ICD-10-CM | POA: Diagnosis not present

## 2019-04-24 NOTE — Addendum Note (Signed)
Addended by: Cloyd Stagers on: 04/24/2019 01:59 PM   Modules accepted: Orders

## 2019-04-24 NOTE — Addendum Note (Signed)
Addended by: Cloyd Stagers on: 04/24/2019 01:57 PM   Modules accepted: Orders

## 2019-04-29 LAB — ALKALINE PHOSPHATASE ISOENZYMES
Alkaline phosphatase (APISO): 146 U/L — ABNORMAL HIGH (ref 35–144)
Bone Isoenzymes: 15 % — ABNORMAL LOW (ref 28–66)
Intestinal Isoenzymes: 6 % (ref 1–24)
Liver Isoenzymes: 64 % (ref 25–69)
Macrohepatic isoenzymes: 15 % — ABNORMAL HIGH

## 2019-05-01 ENCOUNTER — Encounter: Payer: Self-pay | Admitting: Neurology

## 2019-05-01 ENCOUNTER — Other Ambulatory Visit: Payer: Self-pay | Admitting: Family Medicine

## 2019-05-01 DIAGNOSIS — R748 Abnormal levels of other serum enzymes: Secondary | ICD-10-CM

## 2019-05-02 ENCOUNTER — Other Ambulatory Visit: Payer: Self-pay | Admitting: Physician Assistant

## 2019-05-03 ENCOUNTER — Ambulatory Visit
Admission: RE | Admit: 2019-05-03 | Discharge: 2019-05-03 | Disposition: A | Discharge status: Home / Self Care | Payer: Medicare Other | Source: Ambulatory Visit | Attending: Family Medicine | Admitting: Family Medicine

## 2019-05-03 ENCOUNTER — Other Ambulatory Visit: Payer: Self-pay

## 2019-05-03 DIAGNOSIS — R748 Abnormal levels of other serum enzymes: Secondary | ICD-10-CM | POA: Diagnosis not present

## 2019-05-03 IMAGING — US US ABDOMEN COMPLETE
1 series · 13 of 25 positions shown · non-contrast
Comparison: CT [DATE].

CLINICAL DATA: Elevated alkaline phosphatase.

EXAM:
ABDOMEN ULTRASOUND COMPLETE

[Series 1: us abdomen complete · 13 of 91 slices shown]
[im 1/91]
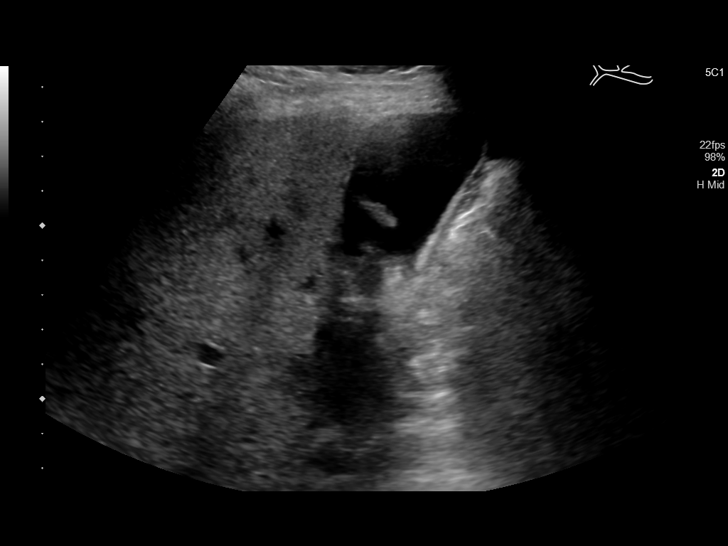
[im 8/91]
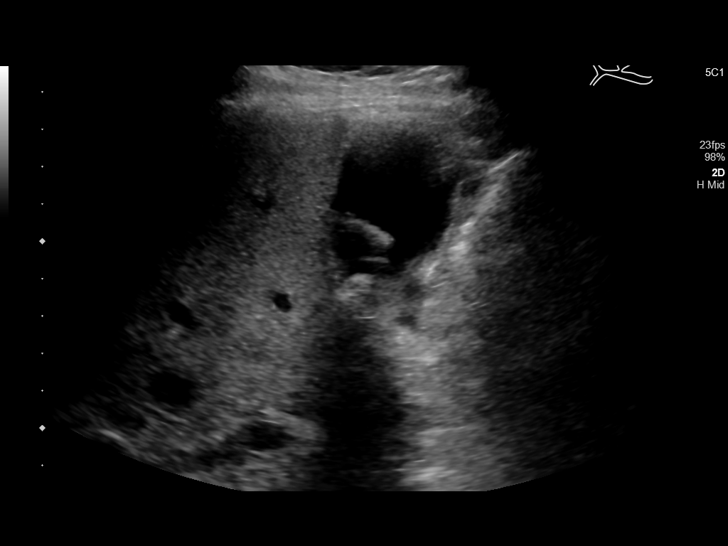
[im 16/91]
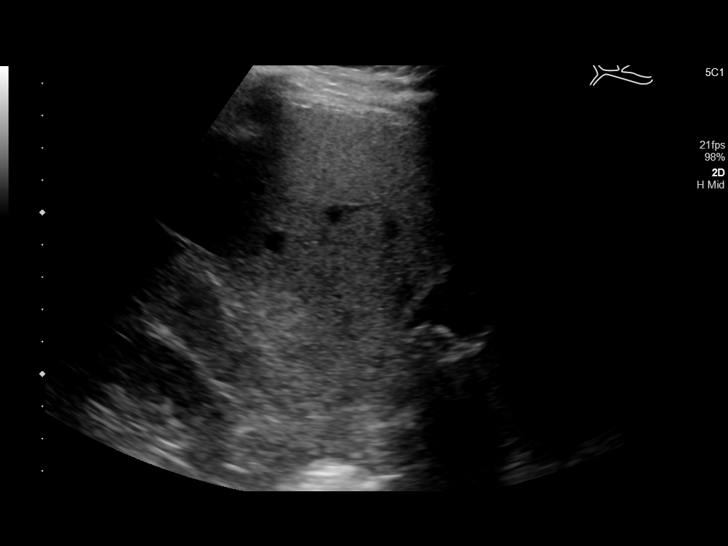
[im 23/91]
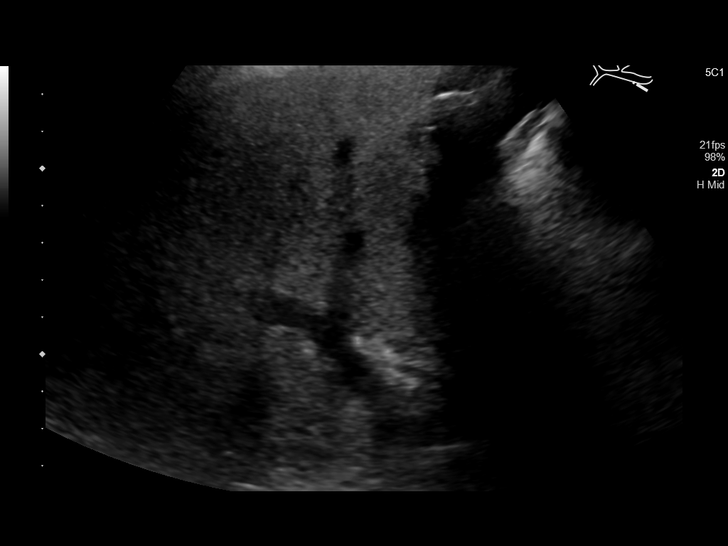
[im 31/91]
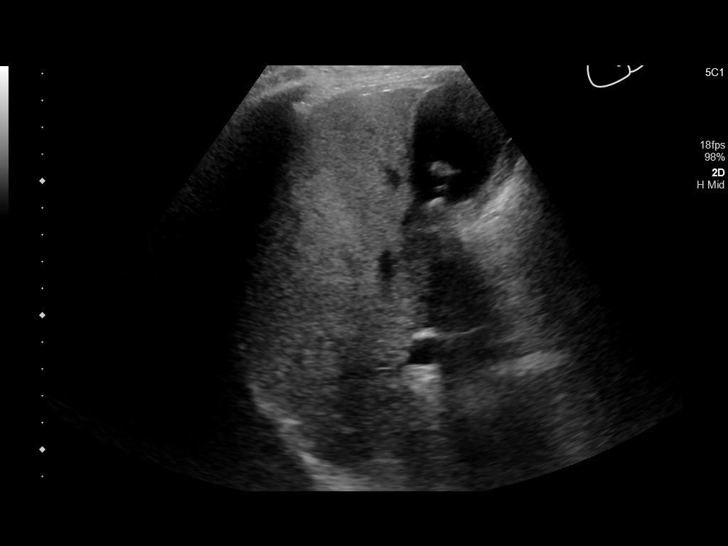
[im 38/91]
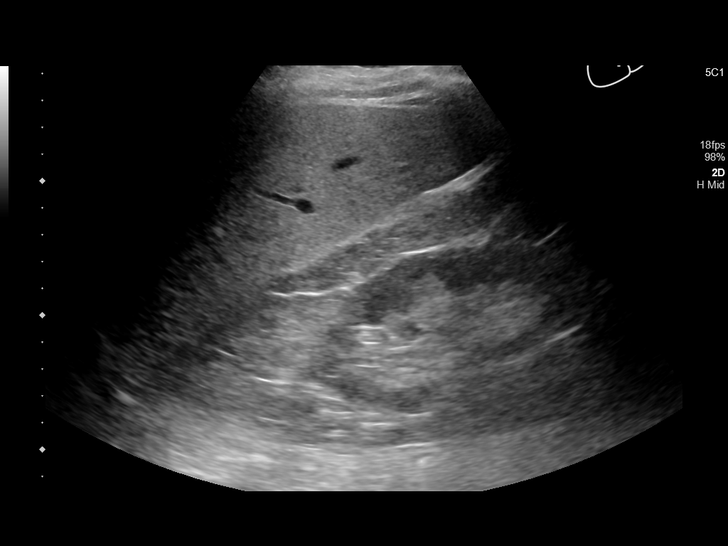
[im 46/91]
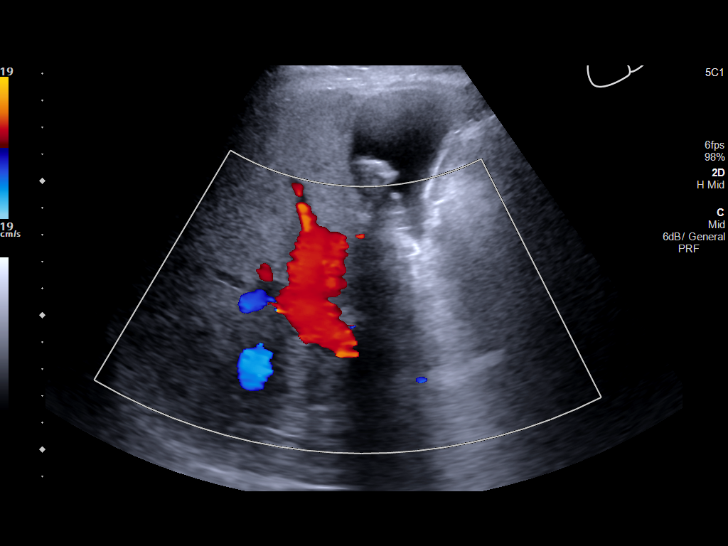
[im 53/91]
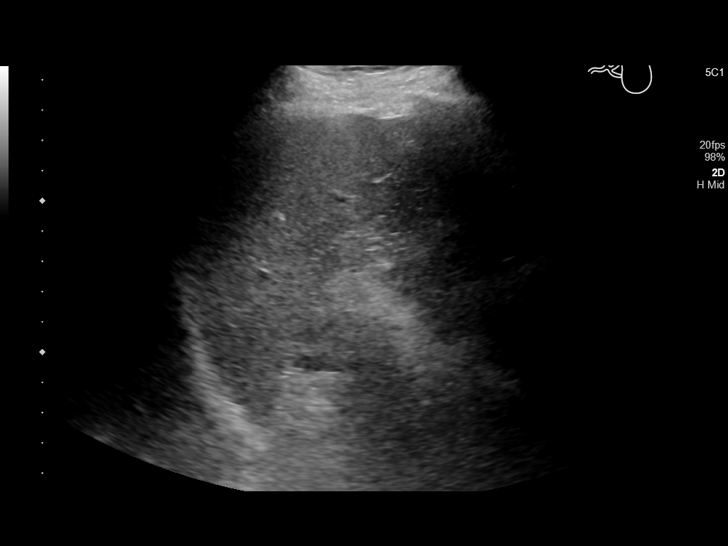
[im 61/91]
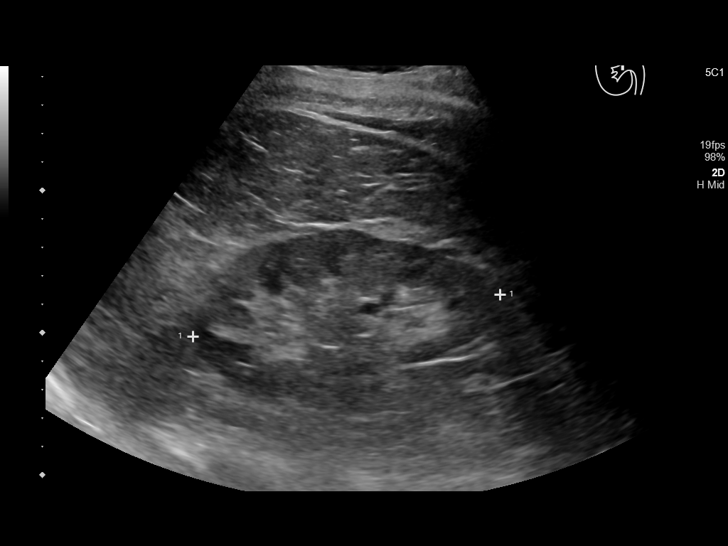
[im 68/91]
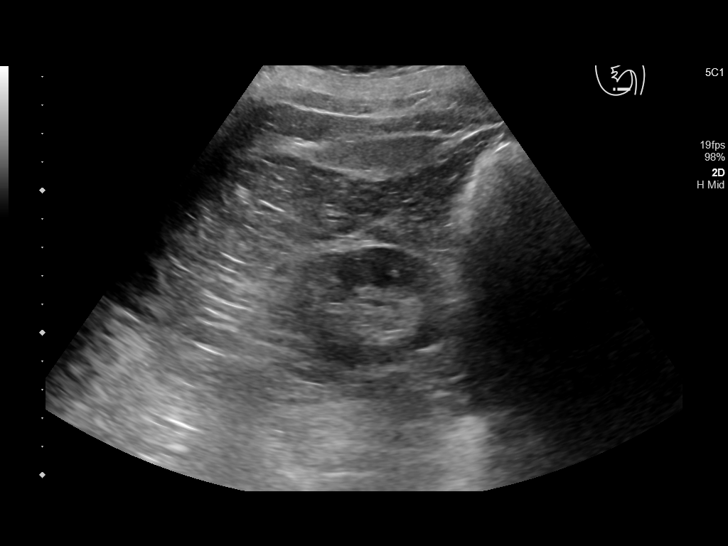
[im 76/91]
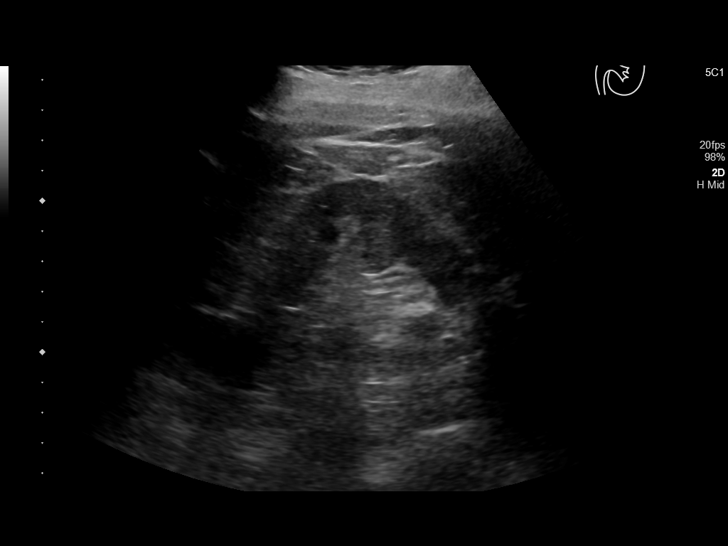
[im 83/91]
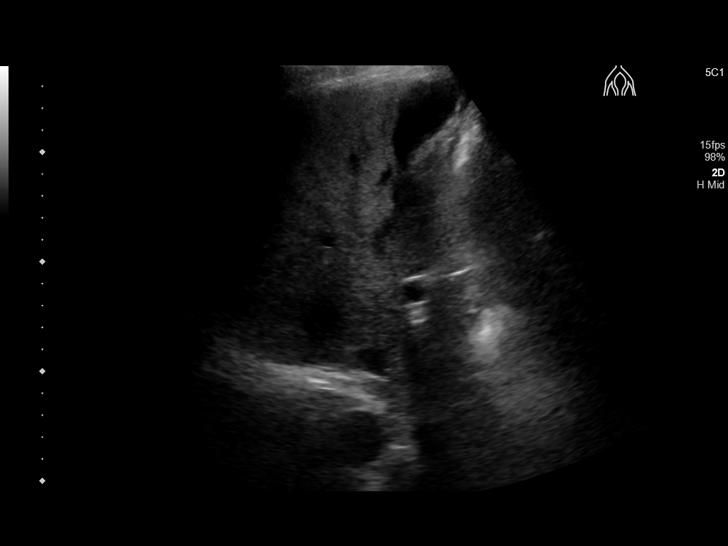
[im 91/91]
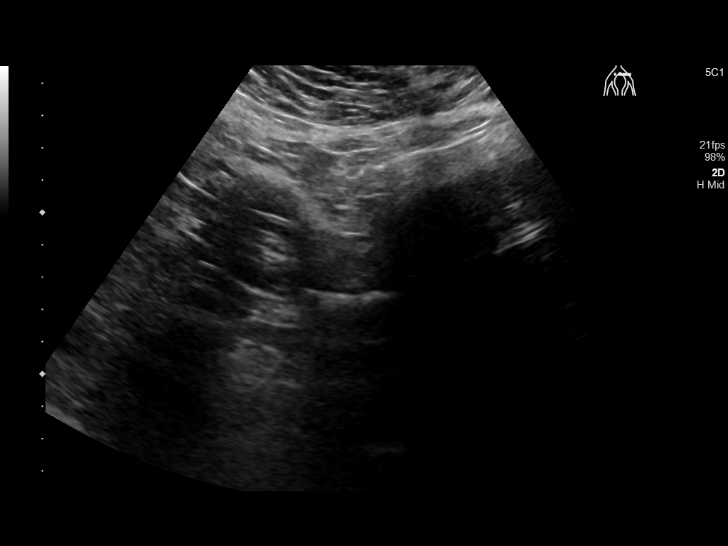

[13 of 25 positions shown; findings below may reference images not displayed]

FINDINGS: Gallbladder: Multiple gallstones and sludge noted. Gallbladder wall
thickness 2.9 mm. Negative Murphy sign.

Common bile duct: Diameter:

Liver: Heterogeneous parenchymal pattern most consistent fatty
infiltration with areas of focal fatty sparing. Portal vein is
patent on color Doppler imaging with normal direction of blood flow
towards the liver.

IVC: No abnormality visualized.

Pancreas: Visualized portion unremarkable.

Spleen: Size and appearance within normal limits.

Right Kidney: Length: 10.9 cm. Echogenicity within normal limits. No
mass or hydronephrosis visualized.

Left Kidney: Length: 11.1 cm. Echogenicity within normal limits.
cm complex cyst left kidney. No hydronephrosis visualized.

Abdominal aorta: No aneurysm visualized.

Other findings: None.
IMPRESSION: 1. Multiple gallstones and sludge noted. Gallbladder wall thickness
2.9 mm. Negative Murphy sign. No biliary distention.

2. Heterogeneous hepatic parenchymal pattern consistent with fatty
infiltration with areas of focal fatty sparing.

3. 1.8 cm complex cyst left kidney. Renal MRI should be considered
to further evaluate.

## 2019-05-05 ENCOUNTER — Other Ambulatory Visit: Payer: Self-pay | Admitting: Family Medicine

## 2019-05-05 DIAGNOSIS — K760 Fatty (change of) liver, not elsewhere classified: Secondary | ICD-10-CM

## 2019-05-05 DIAGNOSIS — N281 Cyst of kidney, acquired: Secondary | ICD-10-CM

## 2019-05-05 DIAGNOSIS — R748 Abnormal levels of other serum enzymes: Secondary | ICD-10-CM

## 2019-05-06 ENCOUNTER — Other Ambulatory Visit: Payer: Self-pay

## 2019-05-06 NOTE — Telephone Encounter (Signed)
[place] Night - Client Nonclinical Telephone Record AccessNurse Client [place] Night - Client Client Site [facility] [place] Physician Renford Dills - MD Contact Type Call Who Is Calling Patient / Member / Family / Caregiver Caller Name Detravious Kirchen Caller Phone Number [phone] Patient Name Howard [name] Patient DOB 11/09/1950 Call Type Message Only Information Provided Reason for Call Request for General Office Information Initial Comment Caller states he needs a refill of Atorvastatin. Caller states he is completely out of the medication, the pharmacy said they contacted the pharmacy but the doctor has not approved the refill, and he is not having symptoms at this time. Caller states he does not need the refill until Monday. Additional Comment Office hours provided. Call Closed By: Christain [place] Transaction Date/Time: 05/04/2019 12:35:21 PM (ET)

## 2019-05-06 NOTE — Telephone Encounter (Signed)
Patient called on call over the weekend requesting a refill on his Atorvastatin.    In review of Dr. Josefine Class notes/patient labs, there is some consideration to increase to 40mg  daily; however, patient's alkaline phosp is still abnormal and further testing is being determined.   Given this information, I will pend the current r/x request at 20mg  daily for MD to review and determine appropriateness.

## 2019-05-07 MED ORDER — ATORVASTATIN CALCIUM 20 MG PO TABS: 20.0000 mg | ORAL_TABLET | Freq: Every day | ORAL | 1 refills | Status: DC

## 2019-05-07 NOTE — Telephone Encounter (Signed)
Would continue 20mg  at this point.  Sent. Thanks.

## 2019-05-08 ENCOUNTER — Ambulatory Visit: Payer: Medicare Other

## 2019-05-10 ENCOUNTER — Encounter

## 2019-05-10 ENCOUNTER — Other Ambulatory Visit: Payer: Self-pay

## 2019-05-10 ENCOUNTER — Encounter: Payer: Self-pay | Admitting: Neurology

## 2019-05-10 ENCOUNTER — Ambulatory Visit (INDEPENDENT_AMBULATORY_CARE_PROVIDER_SITE_OTHER): Payer: Medicare Other | Admitting: Neurology

## 2019-05-10 ENCOUNTER — Encounter: Payer: Medicare Other | Admitting: Family Medicine

## 2019-05-10 VITALS — BP 118/64 | HR 70 | Ht 69.0 in | Wt 210.0 lb

## 2019-05-10 DIAGNOSIS — M48061 Spinal stenosis, lumbar region without neurogenic claudication: Secondary | ICD-10-CM

## 2019-05-10 DIAGNOSIS — M21372 Foot drop, left foot: Secondary | ICD-10-CM | POA: Diagnosis not present

## 2019-05-10 NOTE — Patient Instructions (Addendum)
MRI lumbar spine without contrast. We have sent a referral to [facility] for your MRI and they will call you directly to schedule your appointment. They are located at [place]. If you need to contact them directly please call [phone]. Your provider has referred you for Physical therapy. You should be contacted by [facility] to schedule. If you do not hear from them within 2 weeks,contact [facility] at [phone]. Starting physical therapy for low back stretching and strengthening

## 2019-05-10 NOTE — Progress Notes (Signed)
[facility] Clinic Note - Initial Visit   Date: 05/10/19  Howard [name] MRN: [id number] DOB: Sep 21, 1950   Dear Dr. Damita Dunnings:  Thank you for your kind referral of Howard [name] for consultation of left foot weakness. Although his history is well known to you, please allow [place] to reiterate it for the purpose of our medical record. The patient was accompanied to the clinic by self.    History of Present Illness: Howard [name] is a 68 y.o. left-handed male with prostate cancer s/p prostatectomy, hyperlipidemia, TAA, GERD, and OSA presenting for evaluation of left foot weakness.   About 4 weeks ago, he was sitting at his chair and noticed that he was unable to extend his left foot up towards him.  He did not have any arm weakness or facial weakness.  He denies any associated numbness/tingling of the foot. Later, he noticed that when he was walking, his left foot would drag slightly.  He has not had any falls. He endorses chronic low back pain. He does not have radicular leg pain.  His food weakness has been constant since onset.    Due to acute onset of symptoms, he had MRI brain to evaluate for stroke which was negative.  There is an incidental small left frontal meningioma.    Out-side paper records, electronic medical record, and images have been reviewed where available and summarized as:  MRI brain 04/16/2019: No evidence of acute or recent subacute infarction.  1.1 cm dural-based extra-axial mass overlying the left frontal operculum. This is favored to reflect an incidental small meningioma. Consider contrast-enhanced MRI follow-up.  Mild generalized parenchymal atrophy with moderate chronic small vessel ischemic disease.  Lab Results  Component Value Date   TSH 1.80 04/16/2019   No results found for: ESRSEDRATE, POCTSEDRATE  Past Medical History:  Diagnosis Date  . Anesthesia complication    prolonged sedation after colonoscopy  .  Colon polyps    on colonoscopy 08/2010  . ED (erectile dysfunction)   . Essential hypertension 06/25/2008   Qualifier: Diagnosis of  By: Council Mechanic MD, Hilaria Ota   . GERD (gastroesophageal reflux disease)   . HYPERLIPIDEMIA 07/24/2008   Qualifier: Diagnosis of  By: Council Mechanic MD, Hilaria Ota   . Hypersomnia 01/05/2017  . Near syncope 03/14/2017  . Neck pain 12/15/2011  . OSA (obstructive sleep apnea) 11/13/2016   Dr. Elsworth Soho  . Prostate cancer [facility])    s/p prostatectomy  . Sleep apnea 2018  . Thoracic ascending aortic aneurysm ([place]) 04/17/2017   Chest CTA 10/18: Ascending thoracic aneurysm 4.1 cm, aortic root 4.5 cm >> follow-up 1 year // Echo 8/18:  - Ascending aorta: The ascending aorta was moderately dilated, 4.3 cm. Aortic arch is 3.3 cm // CT 01/2019: Ascending aorta 3.8 cm; sinuses of Valsalva 4.5 cm; stable benign pulmonary nodules >> FU 1 year     Past Surgical History:  Procedure Laterality Date  . COLONOSCOPY    . ESOPHAGOGASTRODUODENOSCOPY  10/02/2000   dilated reflux esopsh hh  . MCH: Dehydration; vasvagal sync  12/09/ 05/2003  . PLANTAR FASCIA SURGERY Bilateral   . POLYPECTOMY    . ROBOT ASSISTED LAPAROSCOPIC RADICAL PROSTATECTOMY N/A 05/27/2013   Procedure: ROBOTIC ASSISTED LAPAROSCOPIC RADICAL PROSTATECTOMY LEVEL 1;  Surgeon: Dutch Gray, MD;  Location: [facility];  Service: Urology;  Laterality: N/A;  . UPPER GASTROINTESTINAL ENDOSCOPY       Medications:  Outpatient Encounter Medications as of 05/10/2019  Medication Sig  . aspirin EC  81 MG tablet Take 1 tablet (81 mg total) by mouth daily.  . cholecalciferol (VITAMIN D) 1000 units tablet Take 1 tablet (1,000 Units total) by mouth daily.  . nitroGLYCERIN (NITROSTAT) 0.4 MG SL tablet Place 1 tablet (0.4 mg total) under the tongue every 5 (five) minutes as needed for chest pain.  Marland Kitchen Omeprazole-Sodium Bicarbonate (ZEGERID OTC) 20-1100 MG CAPS Take 1 capsule by mouth daily before breakfast.  . [DISCONTINUED] atorvastatin (LIPITOR) 20  MG tablet Take 1 tablet (20 mg total) by mouth daily.  . [DISCONTINUED] metoprolol succinate (TOPROL XL) 25 MG 24 hr tablet Take 1 tablet (25 mg total) by mouth at bedtime.   No facility-administered encounter medications on file as of 05/10/2019.     Allergies:  Allergies  Allergen Reactions  . Ibuprofen     "Eats a hole in the lining of his stomach"    Family History: Family History  Problem Relation Age of Onset  . Stroke Mother   . Prostate cancer Neg Hx   . Colon cancer Neg Hx   . Aortic dissection Neg Hx   . Esophageal cancer Neg Hx   . Colon polyps Neg Hx   . Rectal cancer Neg Hx   . Stomach cancer Neg Hx     Social History: Social History   Tobacco Use  . Smoking status: Never Smoker  . Smokeless tobacco: Never Used  Substance Use Topics  . Alcohol use: No    Alcohol/week: 0.0 standard drinks    Comment: no  . Drug use: No   Social History   Social History Narrative   Retired Advertising copywriter, parttime bailiff- laid off as of 2018.    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     Married 1976, lives with wife. 1 step daughter, 2 daughters and 1 son.   [place] [employer] fan   Left handed    Review of Systems:  CONSTITUTIONAL: No fevers, chills, night sweats, or weight loss.   EYES: No visual changes or eye pain ENT: No hearing changes.  No history of nose bleeds.   RESPIRATORY: No cough, wheezing and shortness of breath.   CARDIOVASCULAR: Negative for chest pain, and palpitations.   GI: Negative for abdominal discomfort, blood in stools or black stools.  No recent change in bowel habits.   GU:  No history of incontinence.   MUSCLOSKELETAL: No history of joint pain or swelling.  No myalgias.   SKIN: Negative for lesions, rash, and itching.   HEMATOLOGY/ONCOLOGY: Negative for prolonged bleeding, bruising easily, and swollen nodes.  No history of cancer.   ENDOCRINE: Negative for cold or heat intolerance, polydipsia or goiter.   PSYCH:  No depression or anxiety  symptoms.   NEURO: As Above.   Vital Signs:  BP 118/64   Pulse 70   Ht 5\' 9"  (1.753 m)   Wt 210 lb (95.3 kg)   SpO2 98%   BMI 31.01 kg/m    General Medical Exam:   General:  Well appearing, comfortable.   Eyes/ENT: see cranial nerve examination.   Neck:   No carotid bruits. Respiratory:  Clear to auscultation, good air entry bilaterally.   Cardiac:  Regular rate and rhythm, no murmur.   Extremities:  No deformities, edema, or skin discoloration.  Skin:  No rashes or lesions.  Neurological Exam: MENTAL STATUS including orientation to time, place, person, recent and remote memory, attention span and concentration, language, and fund of knowledge is normal.  Speech is not dysarthric.  CRANIAL NERVES:  II:  No visual field defects.   III-IV-VI: Pupils equal round and reactive to light.  Normal conjugate, extra-ocular eye movements in all directions of gaze.  No nystagmus.  No ptosis.   V:  Normal facial sensation.    VII:  Normal facial symmetry and movements.   VIII:  Normal hearing and vestibular function.   IX-X:  Normal palatal movement.   XI:  Normal shoulder shrug and head rotation.   XII:  Normal tongue strength and range of motion, no deviation or fasciculation.  MOTOR:  No atrophy, fasciculations or abnormal movements.  No pronator drift.   Upper Extremity:  Right  Left  Deltoid  5/5   5/5   Biceps  5/5   5/5   Triceps  5/5   5/5   Infraspinatus 5/5  5/5  Medial pectoralis 5/5  5/5  Wrist extensors  5/5   5/5   Wrist flexors  5/5   5/5   Finger extensors  5/5   5/5   Finger flexors  5/5   5/5   Dorsal interossei  5/5   5/5   Abductor pollicis  5/5   5/5   Tone (Ashworth scale)  0  0   Lower Extremity:  Right  Left  Hip flexors  5/5   5/5   Hip extensors  5/5   5/5   Adductor 5/5  5/5  Abductor 5/5  5/5  Knee flexors  5/5   5/5   Knee extensors  5/5   5/5   Dorsiflexors  5/5   4+/5   Plantarflexors  5/5   5/5   Inversion 5/5  4+/5  Eversion 5/5  4+/5   Toe extensors  5/5   4+/5   Toe flexors  5/5   5/5   Tone (Ashworth scale)  0  0   MSRs:  Right        Left                  brachioradialis 2+  2+  biceps 2+  2+  triceps 2+  2+  patellar 3+  3+  ankle jerk 2+  2+  Hoffman no  no  plantar response down  down   SENSORY:  Normal and symmetric perception of light touch, pinprick, vibration, and proprioception.  Romberg's sign absent.   COORDINATION/GAIT: Normal finger-to- nose-finger and heel-to-shin.  Intact rapid alternating movements bilaterally.  Able to rise from a chair without using arms.  Gait narrow based and stable. Heel walking is asymmetric, but he can perform it.  Toe walking and tandem gait intact.   IMPRESSION: 1.  Left foot drop (mild), most likely due to L5 radiculopathy and probably lumbosacral canal stenosis.  Involvement of inversion makes peroneal mononeuropathy less likely.  - MRI lumbar spine wo contrast  - Start PT for low back strengthening  - Fall precautions discussed  2.  Left frontal meningioma, incidental. Clinically asymptomatic.   Further recommendations pending results.   Thank you for allowing me to participate in patient's care.  If I can answer any additional questions, I would be pleased to do so.    Sincerely,    Sadee Osland K. Posey Pronto, DO

## 2019-05-16 ENCOUNTER — Ambulatory Visit
Admission: RE | Admit: 2019-05-16 | Discharge: 2019-05-16 | Disposition: A | Discharge status: Home / Self Care | Payer: Medicare Other | Source: Ambulatory Visit | Attending: Family Medicine | Admitting: Family Medicine

## 2019-05-16 ENCOUNTER — Other Ambulatory Visit: Payer: Self-pay

## 2019-05-16 DIAGNOSIS — N281 Cyst of kidney, acquired: Secondary | ICD-10-CM | POA: Diagnosis present

## 2019-05-16 LAB — POCT I-STAT CREATININE: Creatinine, Ser: 1.4 mg/dL — ABNORMAL HIGH (ref 0.61–1.24)

## 2019-05-16 IMAGING — MR MR ABDOMEN WO/W CM
20 series · 48 of 48 positions shown · IV contrast (9ml Gadavist)
Comparison: Ultrasound of [DATE].  CT chest from [DATE]

CLINICAL DATA: Complex left renal cyst seen on ultrasound.

EXAM:
MRI ABDOMEN WITHOUT AND WITH CONTRAST
TECHNIQUE: Multiplanar multisequence MR imaging of the abdomen was performed
both before and after the administration of intravenous contrast.
CONTRAST:  9mL GADAVIST GADOBUTROL 1 MMOL/ML IV SOLN

[Series 2: cor haste · coronal · 6.0mm · 1.19mm/px · 2 of 32 slices shown]
[im 1/32]
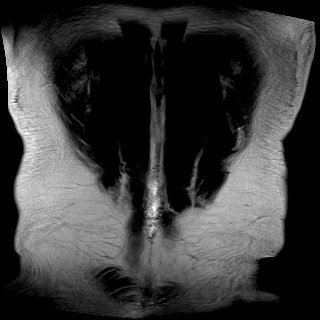
[im 32/32]
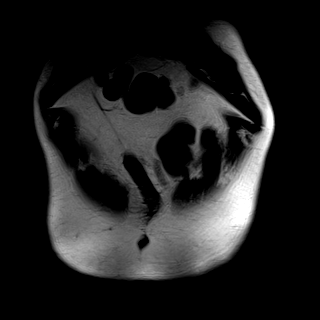

[Series 4: T2 fat-sat · axial · 6.0mm · 1.19mm/px · z∈[-53,+170]mm · 2 of 32 slices shown]
[im 1/32]
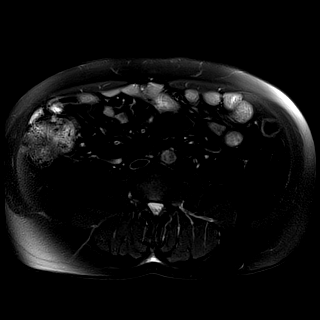
[im 32/32]
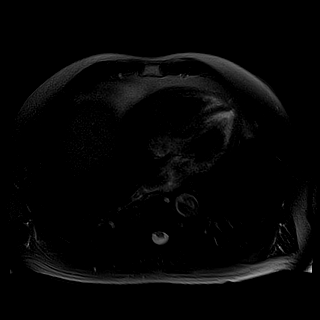

[Series 6: DWI · axial · 6.0mm · 1.42mm/px · z∈[-53,+170]mm · 2 of 32 slices shown (1 of 4)]
[im 1/32]
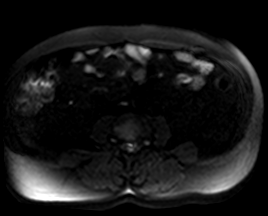
[im 32/32]
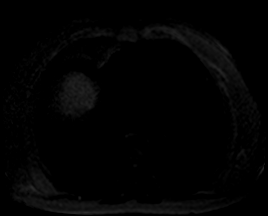

[Series 6: DWI · axial · 6.0mm · 1.42mm/px · z∈[-53,+170]mm · 2 of 32 slices shown (2 of 4)]
[im 1/32]
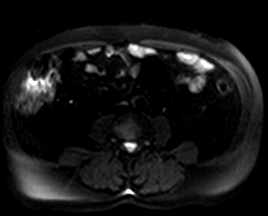
[im 32/32]
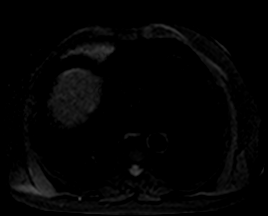

[Series 6: DWI · axial · 6.0mm · 1.42mm/px · 1 of 32 slices shown (3 of 4)]
[im 1/32]
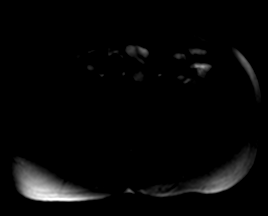

[Series 7: DWI · axial · 6.0mm · 1.42mm/px · 1 of 32 slices shown (4 of 4)]
[im 1/32]
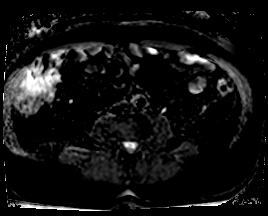

[Series 8: ax in & · axial · 3.0mm · 1.19mm/px · z∈[-48,+165]mm · 3 of 72 slices shown (1 of 2)]
[im 1/72]
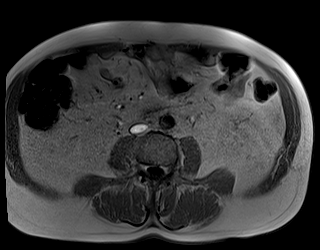
[im 36/72]
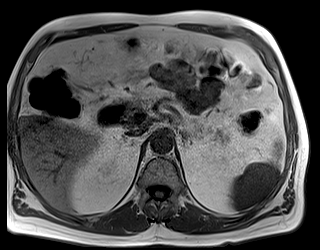
[im 72/72]
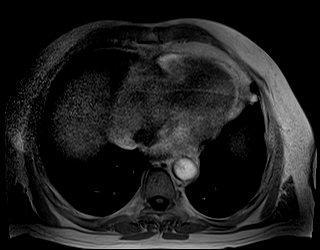

[Series 8: ax in & · axial · 3.0mm · 1.19mm/px · z∈[-48,+165]mm · 3 of 72 slices shown (2 of 2)]
[im 1/72]
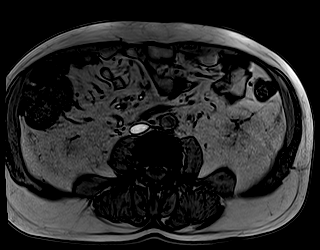
[im 36/72]
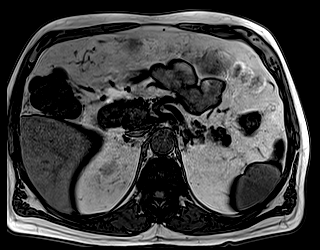
[im 72/72]
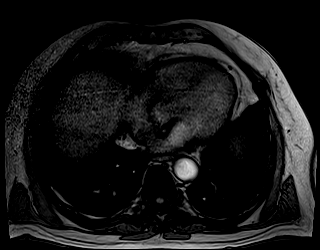

[Series 9: bSSFP · axial · 6.0mm · 0.74mm/px · 1 of 32 slices shown]
[im 1/32]
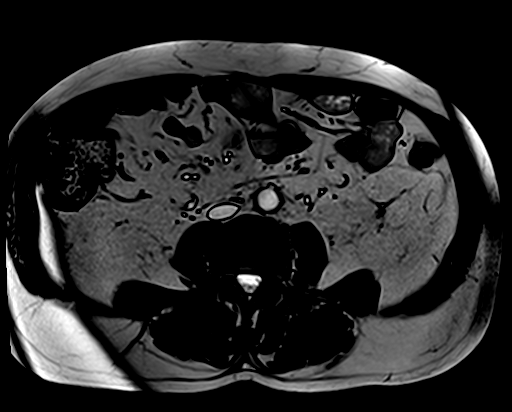

[Series 10: T1 dynamic · axial · non-contrast · 3.0mm · 1.19mm/px · z∈[-48,+165]mm · 3 of 72 slices shown (1 of 9)]
[im 1/72]
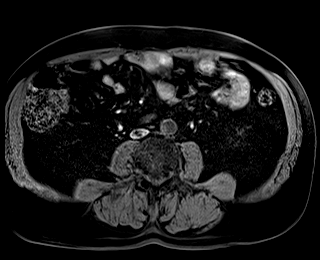
[im 36/72]
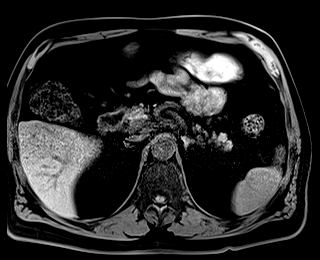
[im 72/72]
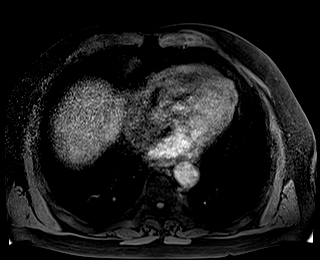

[Series 11: T1 dynamic · axial · 3.0mm · 1.19mm/px · z∈[-48,+165]mm · 3 of 72 slices shown (2 of 9)]
[im 1/72]
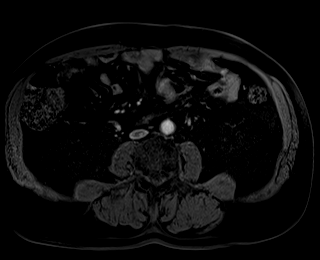
[im 36/72]
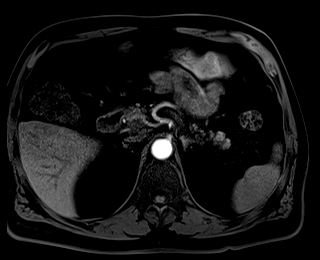
[im 72/72]
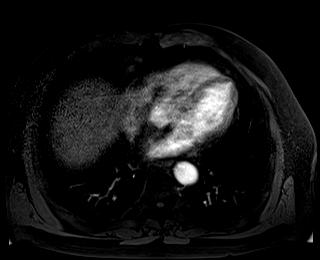

[Series 12: T1 dynamic · axial · 3.0mm · 1.19mm/px · z∈[-48,+165]mm · 3 of 72 slices shown (3 of 9)]
[im 1/72]
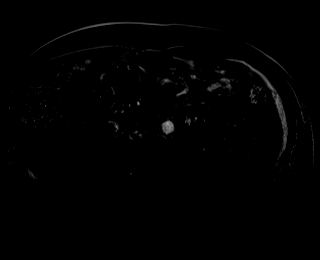
[im 36/72]
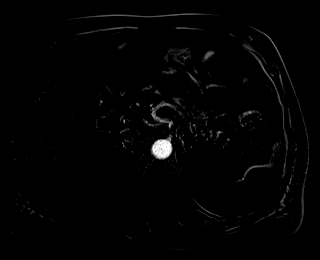
[im 72/72]
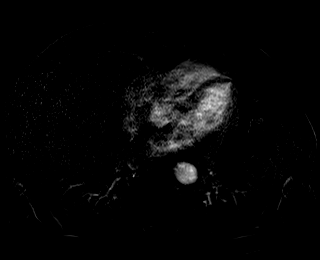

[Series 13: T1 dynamic · axial · 3.0mm · 1.19mm/px · z∈[-48,+165]mm · 3 of 72 slices shown (4 of 9)]
[im 1/72]
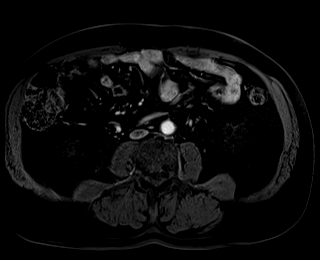
[im 36/72]
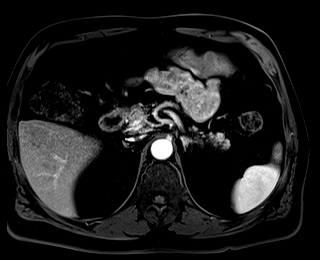
[im 72/72]
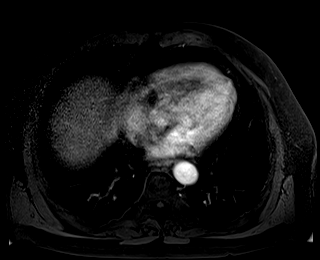

[Series 14: T1 dynamic · axial · 3.0mm · 1.19mm/px · z∈[-48,+165]mm · 3 of 72 slices shown (5 of 9)]
[im 1/72]
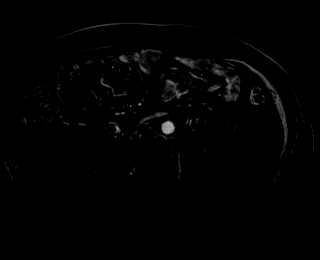
[im 36/72]
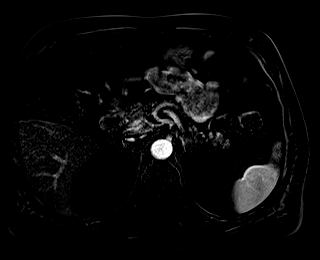
[im 72/72]
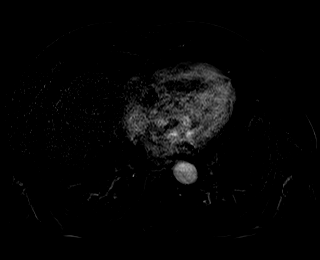

[Series 15: T1 dynamic · axial · 3.0mm · 1.19mm/px · z∈[-48,+165]mm · 3 of 72 slices shown (6 of 9)]
[im 1/72]
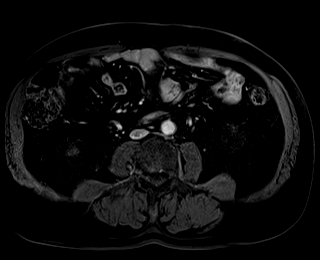
[im 36/72]
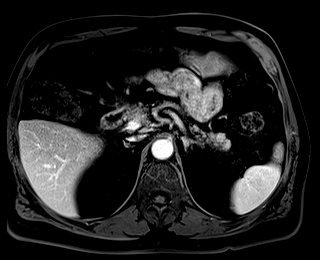
[im 72/72]
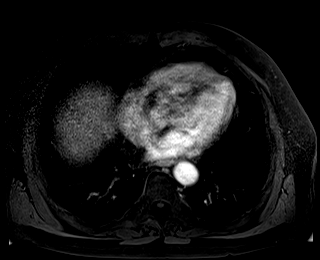

[Series 16: T1 dynamic · axial · 3.0mm · 1.19mm/px · z∈[-48,+165]mm · 3 of 72 slices shown (7 of 9)]
[im 1/72]
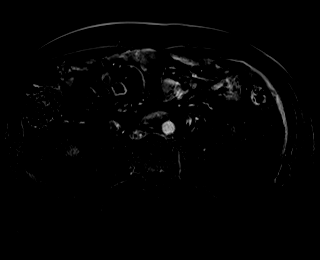
[im 36/72]
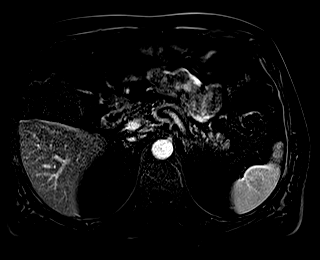
[im 72/72]
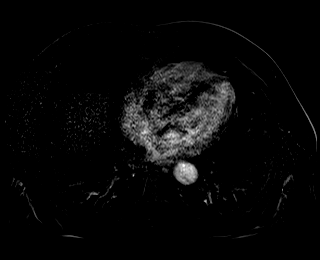

[Series 17: T1 dynamic post-contrast · coronal · 3.0mm · 1.31mm/px · 3 of 80 slices shown]
[im 1/80]
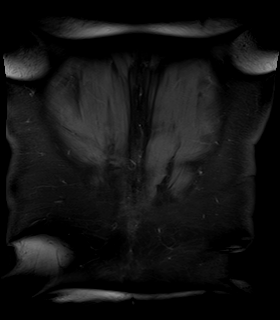
[im 40/80]
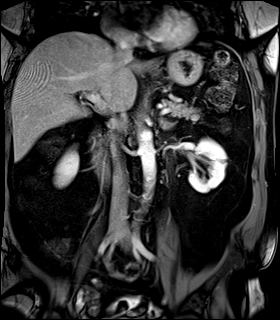
[im 80/80]
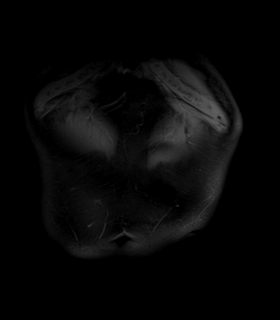

[Series 18: T2 · axial · 6.0mm · 1.19mm/px · 1 of 32 slices shown]
[im 1/32]
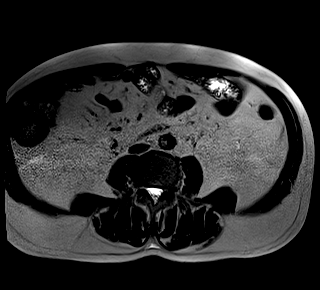

[Series 19: T1 dynamic · axial · 3.0mm · 1.19mm/px · z∈[-48,+165]mm · 3 of 72 slices shown (8 of 9)]
[im 1/72]
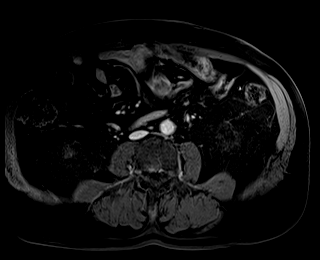
[im 36/72]
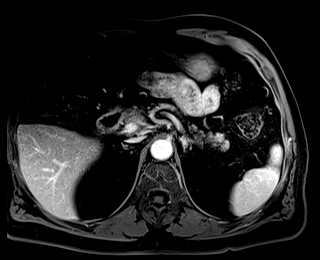
[im 72/72]
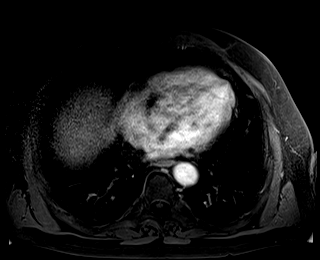

[Series 20: T1 dynamic · axial · 3.0mm · 1.19mm/px · z∈[-48,+165]mm · 3 of 72 slices shown (9 of 9)]
[im 1/72]
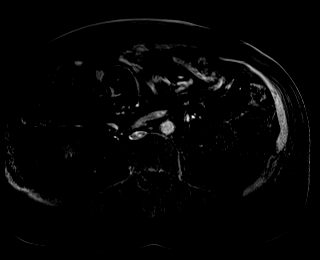
[im 36/72]
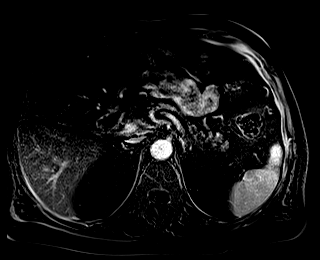
[im 72/72]
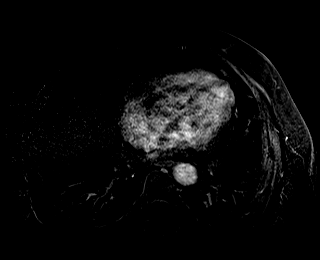

[48 of 48 positions shown; findings below may reference images not displayed]

FINDINGS: Lower chest: Unremarkable

Hepatobiliary: 5 gallstones in the gallbladder present measuring up
to 2.5 cm in long axis. No biliary dilatation.

A T2 hyperintense 1.3 by 0.9 cm lesion in segment 7 of the liver on
image [DATE] demonstrates peripheral nodular centripetal enhancement
characteristic of hepatic hemangioma.

Mild diffuse hepatic steatosis.

Pancreas:  Unremarkable

Spleen:  Unremarkable

Adrenals/Urinary Tract:  Both adrenal glands appear normal.

Laterally in the left mid kidney there is a 1.4 by 0.7 by 0.8 cm
cyst with a small internal septation compatible with Bosniak
category 2. Posteromedially in the left mid kidney there is thinning
of the parenchyma as shown on image 51/15 compatible with scarring.
Just above this is a 0.9 cm Bosniak category 1 cyst. Also
posteromedially in the left mid kidney there is a 1.2 by 0.7 cm
Bosniak category 1 cyst. A 6 mm left kidney lower pole simple cyst
is present. No specific worrisome renal lesion is identified.

Stomach/Bowel: Descending colon diverticulosis.

Vascular/Lymphatic:  Unremarkable

Other:  No supplemental non-categorized findings.

Musculoskeletal: Mild levoconvex lumbar scoliosis.
IMPRESSION: 1. In addition to a small Bosniak category 2 cyst in the left mid
kidney, there is some mild scarring in the left mid kidney.
Additional small Bosniak category 1 cysts of the left kidney are
identified. No worrisome renal lesions.
2. Small hepatic hemangioma.
3. Cholelithiasis.
4. Descending colon diverticulosis.
5. Mild diffuse hepatic steatosis.

## 2019-05-16 MED ORDER — GADOBUTROL 1 MMOL/ML IV SOLN
9.0000 mL | Freq: Once | INTRAVENOUS | Status: AC | PRN
  Administered 2019-05-16: 9 mL via INTRAVENOUS

## 2019-05-19 ENCOUNTER — Emergency Department
Admission: EM | Admit: 2019-05-19 | Discharge: 2019-05-19 | Disposition: A | Discharge status: Home / Self Care | Payer: Medicare Other | Attending: Emergency Medicine | Admitting: Emergency Medicine

## 2019-05-19 ENCOUNTER — Other Ambulatory Visit: Payer: Self-pay

## 2019-05-19 DIAGNOSIS — R070 Pain in throat: Secondary | ICD-10-CM | POA: Diagnosis not present

## 2019-05-19 DIAGNOSIS — I10 Essential (primary) hypertension: Secondary | ICD-10-CM | POA: Diagnosis not present

## 2019-05-19 DIAGNOSIS — M7918 Myalgia, other site: Secondary | ICD-10-CM | POA: Insufficient documentation

## 2019-05-19 DIAGNOSIS — Z7982 Long term (current) use of aspirin: Secondary | ICD-10-CM | POA: Diagnosis not present

## 2019-05-19 DIAGNOSIS — Z20828 Contact with and (suspected) exposure to other viral communicable diseases: Secondary | ICD-10-CM | POA: Insufficient documentation

## 2019-05-19 DIAGNOSIS — Z79899 Other long term (current) drug therapy: Secondary | ICD-10-CM | POA: Diagnosis not present

## 2019-05-19 DIAGNOSIS — R509 Fever, unspecified: Secondary | ICD-10-CM | POA: Diagnosis present

## 2019-05-19 DIAGNOSIS — Z20822 Contact with and (suspected) exposure to covid-19: Secondary | ICD-10-CM

## 2019-05-19 DIAGNOSIS — Z8546 Personal history of malignant neoplasm of prostate: Secondary | ICD-10-CM | POA: Diagnosis not present

## 2019-05-19 LAB — SARS CORONAVIRUS 2 (TAT 6-24 HRS): SARS Coronavirus 2: NEGATIVE

## 2019-05-19 NOTE — ED Triage Notes (Signed)
Pt and his wife states they both woke up with fever 101 with a sore throat this morning and are wanting to be tested and treated for there sx.

## 2019-05-19 NOTE — ED Provider Notes (Signed)
[facility] Emergency Department Provider Note  ____________________________________________   First MD Initiated Contact with Patient 05/19/19 [phone]     (approximate)  I have reviewed the triage vital signs and the nursing notes.   HISTORY  Chief Complaint Fever and Sore Throat    HPI Howard [name] is a 68 y.o. male presents emergency department with complaint  of fever, and sore throat, denies chills, body aches.  Cough, vomiting, diarrhea; denies chest pain or sob.  Denies loss of taste of smell or taste.  Sx for 1-2 days   Past Medical History:  Diagnosis Date  . Anesthesia complication    prolonged sedation after colonoscopy  . Colon polyps    on colonoscopy 08/2010  . ED (erectile dysfunction)   . Essential hypertension 06/25/2008   Qualifier: Diagnosis of  By: Council Mechanic MD, Hilaria Ota   . GERD (gastroesophageal reflux disease)   . HYPERLIPIDEMIA 07/24/2008   Qualifier: Diagnosis of  By: Council Mechanic MD, Hilaria Ota   . Hypersomnia 01/05/2017  . Near syncope 03/14/2017  . Neck pain 12/15/2011  . OSA (obstructive sleep apnea) 11/13/2016   Dr. Elsworth Soho  . Prostate cancer [facility])    s/p prostatectomy  . Sleep apnea 2018  . Thoracic ascending aortic aneurysm ([place]) 04/17/2017   Chest CTA 10/18: Ascending thoracic aneurysm 4.1 cm, aortic root 4.5 cm >> follow-up 1 year // Echo 8/18:  - Ascending aorta: The ascending aorta was moderately dilated, 4.3 cm. Aortic arch is 3.3 cm // CT 01/2019: Ascending aorta 3.8 cm; sinuses of Valsalva 4.5 cm; stable benign pulmonary nodules >> FU 1 year     Patient Active Problem List   Diagnosis Date Noted  . Meningioma ([place]) 04/17/2019  . Left-sided weakness 04/17/2019  . Wax in ear 08/26/2018  . Premature atrial beats 05/04/2018  . Coronary artery calcification seen on CT scan 05/04/2018  . Paroxysmal atrial fibrillation ([place]) 02/20/2018  . Atypical chest pain 02/20/2018  . Chest pain 02/09/2018  . Left groin pain  01/14/2018  . Vitamin D deficiency 12/10/2017  . Intermittent claudication ([place]) 07/20/2017  . OSA (obstructive sleep apnea) 06/02/2017  . Thoracic ascending aortic aneurysm ([place]) 04/17/2017  . Near syncope 03/14/2017  . Hypersomnia 01/05/2017  . Colon cancer screening 12/01/2016  . Health care maintenance 12/01/2016  . GERD (gastroesophageal reflux disease) 12/01/2016  . BPV (benign positional vertigo) 04/21/2016  . Advance care planning 08/27/2015  . Hyperlipidemia LDL goal <70 08/04/2014  . Medicare welcome visit 12/20/2012  . Prostate cancer ([place]) 01/13/2011  . Essential hypertension 06/25/2008  . ATOPIC RHINITIS 01/26/2007  . 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 COLONOSCOPY    . ESOPHAGOGASTRODUODENOSCOPY  10/02/2000   dilated reflux esopsh hh  . MCH: Dehydration; vasvagal sync  12/09/ 05/2003  . PLANTAR FASCIA SURGERY Bilateral   . POLYPECTOMY    . ROBOT ASSISTED LAPAROSCOPIC RADICAL PROSTATECTOMY N/A 05/27/2013   Procedure: ROBOTIC ASSISTED LAPAROSCOPIC RADICAL PROSTATECTOMY LEVEL 1;  Surgeon: Dutch Gray, MD;  Location: [facility];  Service: Urology;  Laterality: N/A;  . UPPER GASTROINTESTINAL ENDOSCOPY      Prior to Admission medications   Medication Sig Start Date End Date Taking? Authorizing Provider  aspirin EC 81 MG tablet Take 1 tablet (81 mg total) by mouth daily. 04/16/19   Tonia Ghent, MD  atorvastatin (LIPITOR) 20 MG tablet Take 1 tablet (20 mg total) by mouth daily. 05/07/19   Elsie Stain  S, MD  cholecalciferol (VITAMIN D) 1000 units tablet Take 1 tablet (1,000 Units total) by mouth daily. 06/09/17   Tonia Ghent, MD  metoprolol succinate (TOPROL-XL) 25 MG 24 hr tablet TAKE 1 TABLET BY MOUTH EVERYDAY AT BEDTIME 05/03/19   Weaver, Scott T, PA-C  nitroGLYCERIN (NITROSTAT) 0.4 MG SL tablet Place 1 tablet (0.4 mg total) under the tongue every 5 (five) minutes as needed for chest pain. 02/27/18   Nahser,  Wonda Cheng, MD  Omeprazole-Sodium Bicarbonate (ZEGERID OTC) 20-1100 MG CAPS Take 1 capsule by mouth daily before breakfast.    [provider]    Allergies Ibuprofen  Family History  Problem Relation Age of Onset  . Stroke Mother   . Prostate cancer Neg Hx   . Colon cancer Neg Hx   . Aortic dissection Neg Hx   . Esophageal cancer Neg Hx   . Colon polyps Neg Hx   . Rectal cancer Neg Hx   . Stomach cancer Neg Hx     Social History Social History   Tobacco Use  . Smoking status: Never Smoker  . Smokeless tobacco: Never Used  Substance Use Topics  . Alcohol use: No    Alcohol/week: 0.0 standard drinks    Comment: no  . Drug use: No    Review of Systems  Constitutional: Positive fever/chills Eyes: No visual changes. ENT: Positive sore throat. Respiratory: Denies cough Genitourinary: Negative for dysuria. Musculoskeletal: Negative for back pain. Skin: Negative for rash.    ____________________________________________   PHYSICAL EXAM:  VITAL SIGNS: ED Triage Vitals  Enc Vitals Group     BP 05/19/19 0828 (!) 139/95     Pulse Rate 05/19/19 0828 (!) 59     Resp 05/19/19 0828 16     Temp 05/19/19 0828 97.7 F (36.5 C)     Temp Source 05/19/19 0828 Oral     SpO2 05/19/19 0828 98 %     Weight 05/19/19 0818 206 lb (93.4 kg)     Height 05/19/19 0818 5\' 9"  (1.753 m)     Head Circumference --      Peak Flow --      Pain Score 05/19/19 0818 0     Pain Loc --      Pain Edu? --      Excl. in [employer]? --     Constitutional: Alert and oriented. Well appearing and in no acute distress. Eyes: Conjunctivae are normal.  Head: Atraumatic. Nose: No congestion/rhinnorhea. Mouth/Throat: Mucous membranes are moist.   Neck:  supple no lymphadenopathy noted Cardiovascular: Normal rate, regular rhythm. Heart sounds are normal Respiratory: Normal respiratory effort.  No retractions, lungs c t a  GU: deferred Musculoskeletal: FROM all extremities, warm and well perfused  Neurologic:  Normal speech and language.  Skin:  Skin is warm, dry and intact. No rash noted. Psychiatric: Mood and affect are normal. Speech and behavior are normal.  ____________________________________________   LABS (all labs ordered are listed, but only abnormal results are displayed)  Labs Reviewed  SARS CORONAVIRUS 2 (TAT 6-24 HRS)   ____________________________________________   ____________________________________________  RADIOLOGY    ____________________________________________   PROCEDURES  Procedure(s) performed: No  Procedures    ____________________________________________   INITIAL IMPRESSION / ASSESSMENT AND PLAN / ED COURSE  Pertinent labs & imaging results that were available during my care of the patient were reviewed by me and considered in my medical decision making (see chart for details).   Patient 68 year old male presents emergency department with  fever and sore throat.  Patient is concerned he is COVID.  Physical exam shows patient to appear well.  Vitals are normal.  Patient was given a COVID test  Patient was discharged stable condition.  Code instructions were discussed.  They are to quarantine until test results are received.  If they worsening they should return emergency department.  States he understands will comply.  Is discharged stable condition.     As part of my medical decision making, I reviewed the following data within the [place] History obtained from family, Nursing notes reviewed and incorporated, Old chart reviewed, Notes from prior ED visits and [place] Controlled Substance Database  ____________________________________________   FINAL CLINICAL IMPRESSION(S) / ED DIAGNOSES  Final diagnoses:  Suspected COVID-19 virus infection      NEW MEDICATIONS STARTED DURING THIS VISIT:  New Prescriptions   No medications on file     Note:  This document was prepared using Dragon voice recognition  software and may include unintentional dictation errors.    Versie Starks, PA-C 05/19/19 [id number]    Arta Silence, MD 05/19/19 1059

## 2019-05-19 NOTE — Discharge Instructions (Addendum)
Follow-up with your regular doctor as needed.  Return emergency department or call your regular doctor if you feel that you are worsening.  Your test results should be back in 6 to 24 hours.  And nurse will notify you of your results.  If you have not heard from [place] in 2 days please call the emergency department for your test results.  Take Tylenol for fever if needed.

## 2019-05-20 ENCOUNTER — Telehealth: Payer: Self-pay

## 2019-05-20 ENCOUNTER — Telehealth: Payer: Self-pay | Admitting: General Practice

## 2019-05-20 NOTE — Telephone Encounter (Signed)
Copied from [place] [id number]. Topic: General - Inquiry >> May 17, 2019  5:05 PM Oneta Rack wrote: Reason for CRM: patient [place] call, best call back # [phone]

## 2019-05-20 NOTE — Telephone Encounter (Signed)
Spoke with patient.

## 2019-05-20 NOTE — Telephone Encounter (Signed)
Patient informed of negative covid test. Patient verbalized understanding.

## 2019-05-24 ENCOUNTER — Other Ambulatory Visit: Payer: Self-pay

## 2019-05-24 ENCOUNTER — Ambulatory Visit
Admission: RE | Admit: 2019-05-24 | Discharge: 2019-05-24 | Disposition: A | Discharge status: Home / Self Care | Payer: Medicare Other | Source: Ambulatory Visit | Attending: Neurology | Admitting: Neurology

## 2019-05-24 DIAGNOSIS — M21372 Foot drop, left foot: Secondary | ICD-10-CM

## 2019-05-24 DIAGNOSIS — M48061 Spinal stenosis, lumbar region without neurogenic claudication: Secondary | ICD-10-CM

## 2019-05-24 IMAGING — MR MR LUMBAR SPINE W/O CM
4 of 5 series · 18 of 48 positions shown · non-contrast
Comparison: None.

CLINICAL DATA: Low back pain worse on the left with pain radiating
into the left leg. Left foot drop. Symptoms for several months.
History of prostate cancer.

EXAM:
MRI LUMBAR SPINE WITHOUT CONTRAST
TECHNIQUE: Multiplanar, multisequence MR imaging of the lumbar spine was
performed. No intravenous contrast was administered.

[Series 6: T2 · sagittal · 4.0mm · 0.73mm/px · 6 of 18 slices shown (1 of 2)]
[im 1/18]
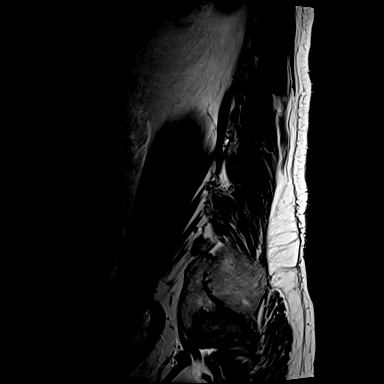
[im 4/18]
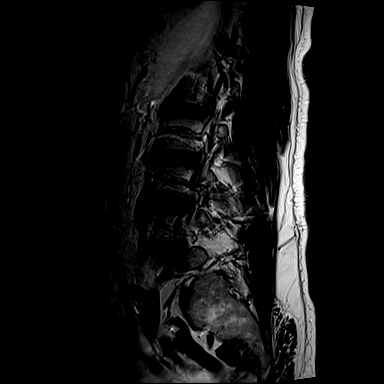
[im 7/18]
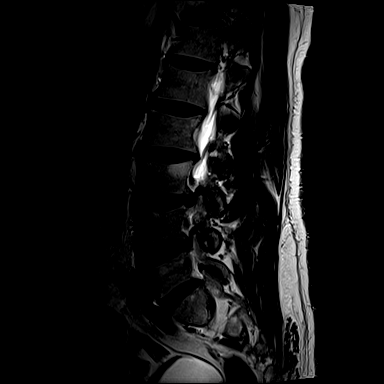
[im 11/18]
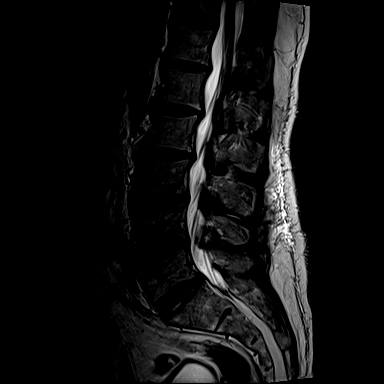
[im 14/18]
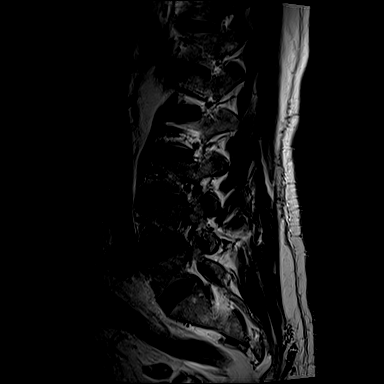
[im 18/18]
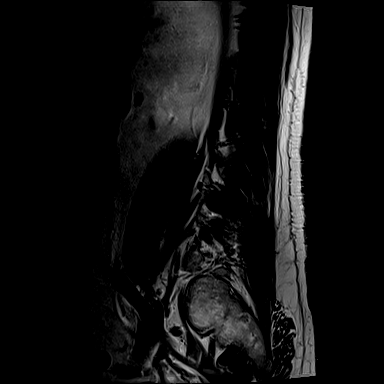

[Series 7: T1 · sagittal · 4.0mm · 0.73mm/px · 3 of 18 slices shown (1 of 2)]
[im 4/18]
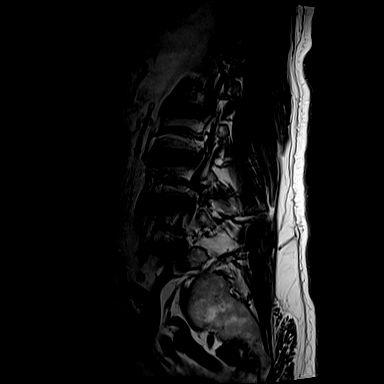
[im 11/18]
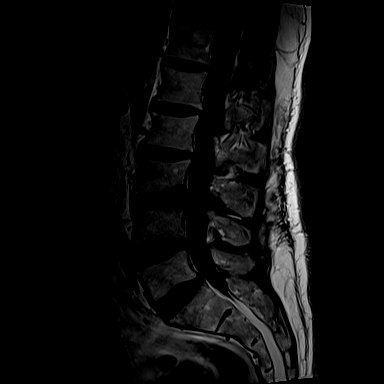
[im 18/18]
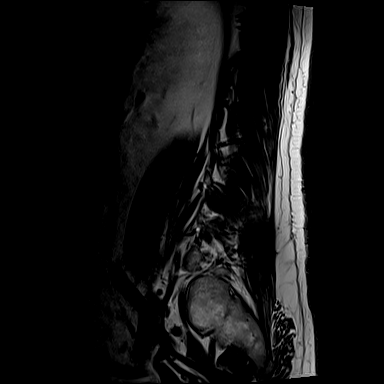

[Series 13: T2 · axial · 4.0mm · 0.28mm/px · z∈[-174,+23]mm · 6 of 43 slices shown (2 of 2)]
[im 1/43]
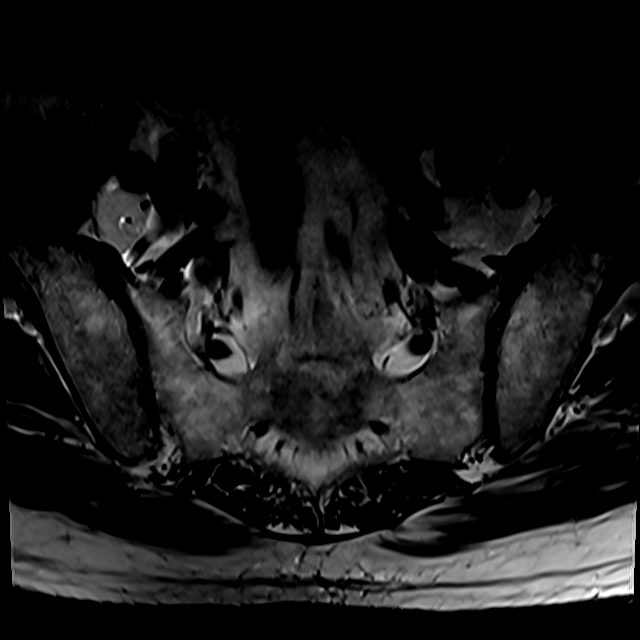
[im 7/43]
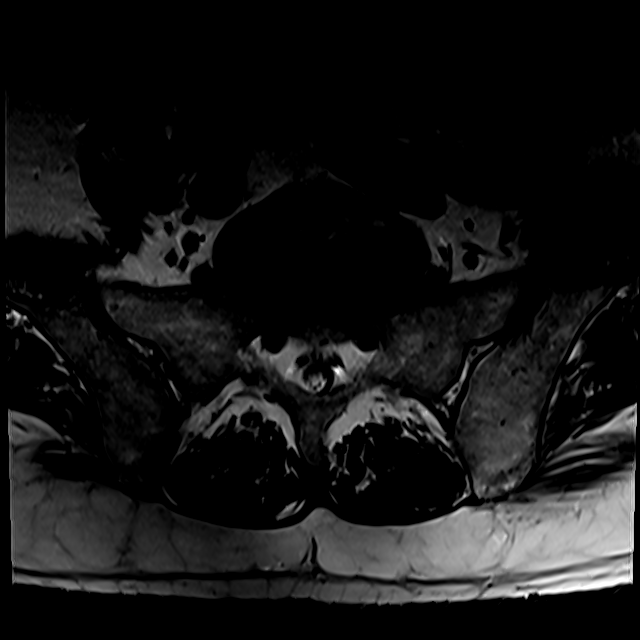
[im 13/43]
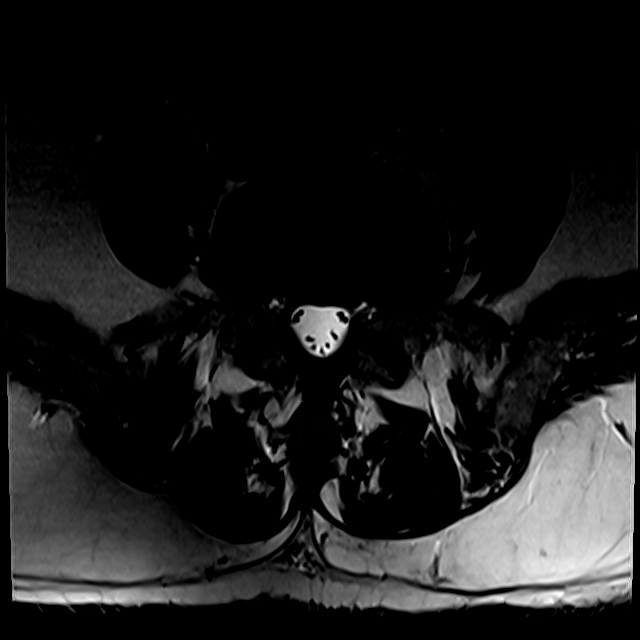
[im 19/43]
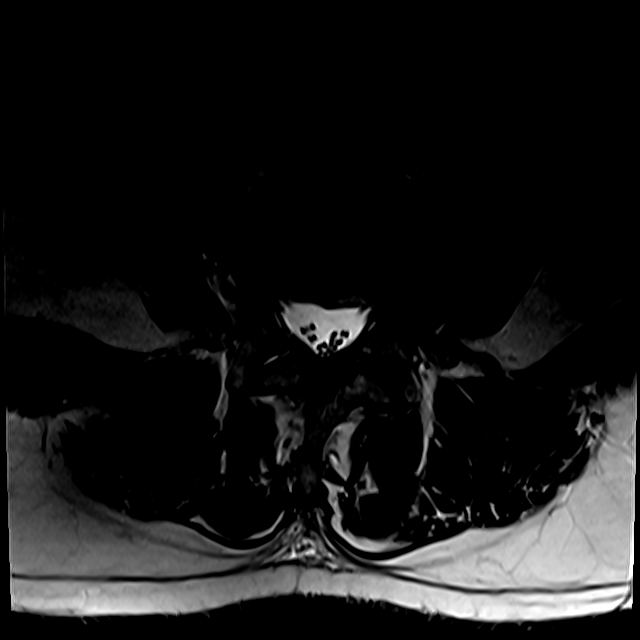
[im 22/43]
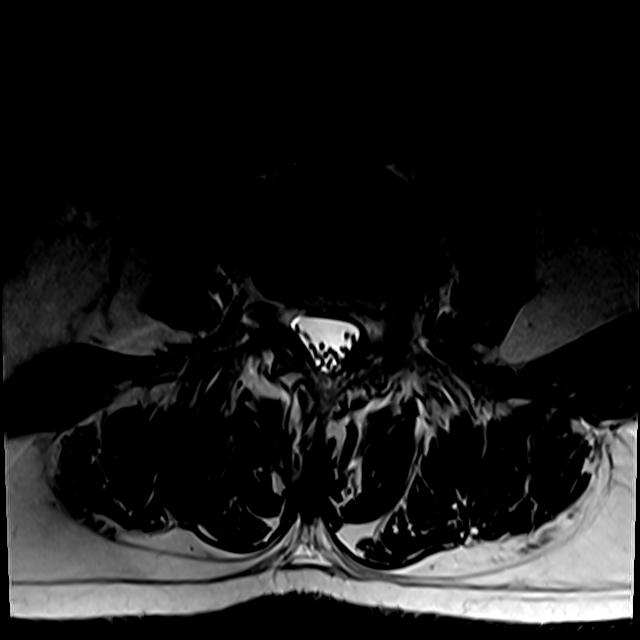
[im 37/43]
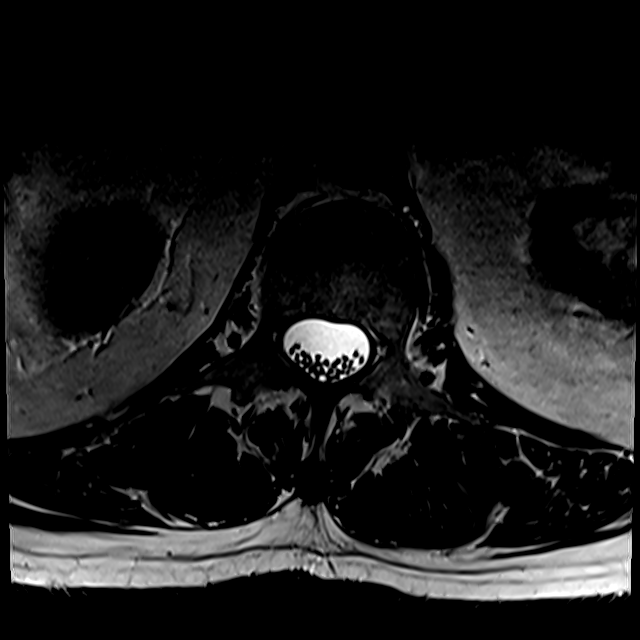

[Series 100: T1 · axial · 4.0mm · 0.28mm/px · z∈[-145,+23]mm · 3 of 43 slices shown (2 of 2)]
[im 7/43]
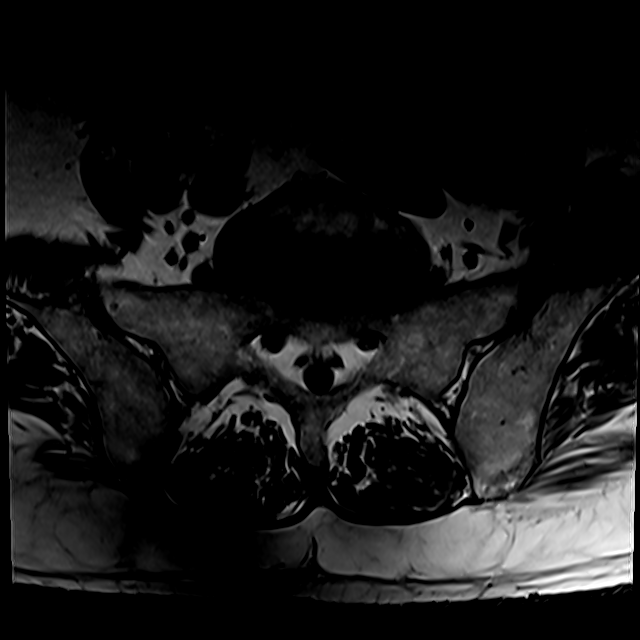
[im 22/43]
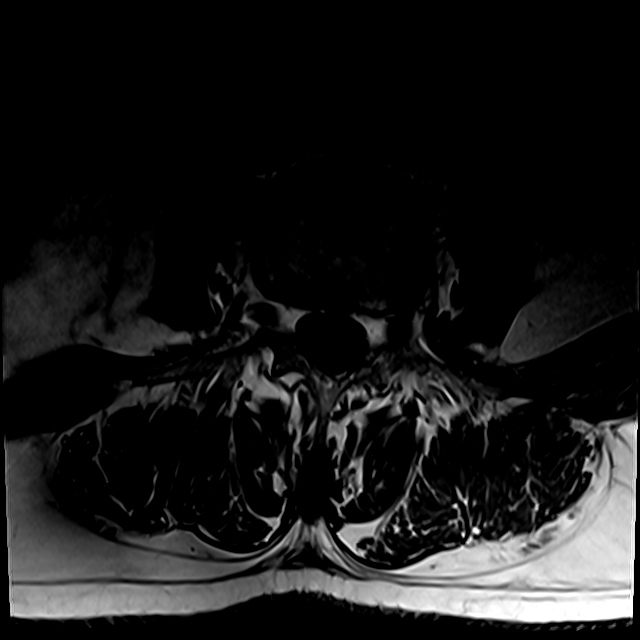
[im 37/43]
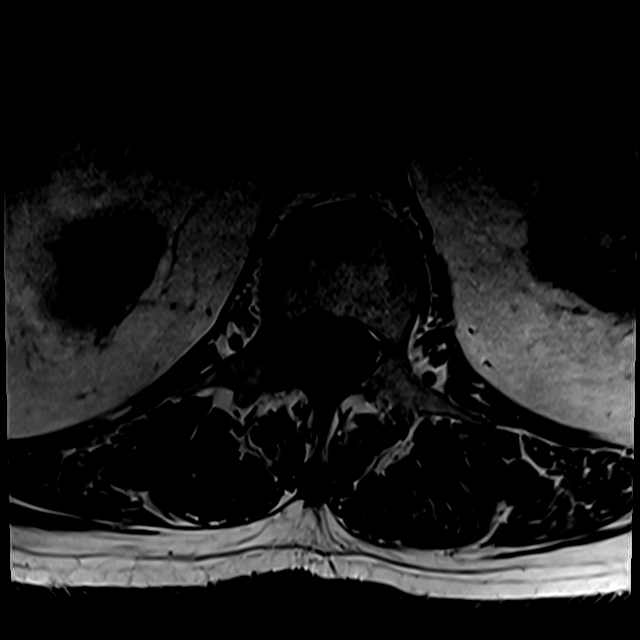

[18 of 48 positions shown; findings below may reference images not displayed]

FINDINGS: Segmentation:  Standard.

Alignment: Minimal left convex curvature of the lumbar spine. No
significant listhesis.

Vertebrae: No fracture or suspicious marrow lesion. Minimal Modic
type 1 changes posteriorly at L3-4. Small hemangioma in the L3
vertebral body.

Conus medullaris and cauda equina: Conus extends to the upper L1
level. Conus and cauda equina appear normal.

Paraspinal and other soft tissues: 1.1 cm T2 hyperintense lesion in
the medial upper pole of the left kidney compatible with a cyst as
seen on [DATE] abdominal MRI.

Disc levels:

Disc desiccation throughout the lumbar spine. Mild disc space
narrowing from L1-2 to L3-4.

T12-L1 minimal disc bulging and endplate spurring without stenosis.

L1-2: Mild disc bulging and endplate spurring without stenosis.

L2-3: Mild disc bulging, mild endplate spurring, and mild facet
hypertrophy without significant stenosis.

L3-4: Disc bulging, a left foraminal to extraforaminal disc
protrusion, and mild-to-moderate facet and ligamentum flavum
hypertrophy result in mild left lateral recess stenosis and mild
right and moderate left neural foraminal stenosis with potential
left L3 nerve root impingement. No spinal stenosis.

L4-5: Mild disc bulging, endplate spurring, and mild-to-moderate
facet and ligamentum flavum hypertrophy result in mild left neural
foraminal stenosis and mild bilateral lateral recess stenosis
without significant spinal stenosis.

L5-S1: Mild facet hypertrophy without disc herniation or stenosis.
IMPRESSION: 1. Multilevel lumbar disc and facet degeneration, most notable at
L3-4 where there is moderate left neural foraminal stenosis and
potential L3 nerve root impingement due to a disc protrusion.
2. Mild lateral recess and left neural foraminal stenosis at L4-5.

## 2019-05-26 ENCOUNTER — Other Ambulatory Visit: Payer: Self-pay | Admitting: Physician Assistant

## 2019-05-28 NOTE — Telephone Encounter (Signed)
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  Therefore, I recommend doing nerve testing of the leg to see if there is nerve impingement within the leg itself.  If patient is agreeable, please order EMG L>R lower extremity.  Thank you

## 2019-05-28 NOTE — Telephone Encounter (Signed)
Left message for patient to call in office at 1142

## 2019-05-29 ENCOUNTER — Telehealth: Payer: Self-pay | Admitting: Neurology

## 2019-05-29 NOTE — Telephone Encounter (Signed)
Called patient this afternoon, per [place], and left a message to call back and ask for Starla to schedule: EMG L>R lower extremity.

## 2019-05-29 NOTE — Telephone Encounter (Signed)
1. Patient scheduled for 06/27/2019 at 10:15 AM for: EMG L>R lower extremity.  2. Patient wants to know if he should continue to do physical therapy at [facility].

## 2019-05-29 NOTE — Telephone Encounter (Signed)
Left message with the after hour service on 05-28-19 @ 5:28 pm    Caller states he is returning [place] call

## 2019-05-29 NOTE — Telephone Encounter (Signed)
Continue physical therapy

## 2019-05-30 NOTE — Telephone Encounter (Signed)
Left message for patient to continue physical activity

## 2019-05-30 NOTE — Telephone Encounter (Signed)
Yes, please continue PT

## 2019-06-05 ENCOUNTER — Other Ambulatory Visit: Payer: Self-pay

## 2019-06-05 ENCOUNTER — Other Ambulatory Visit: Payer: Self-pay | Admitting: Family Medicine

## 2019-06-05 ENCOUNTER — Encounter: Payer: Self-pay | Admitting: Gastroenterology

## 2019-06-05 ENCOUNTER — Telehealth: Payer: Self-pay | Admitting: Family Medicine

## 2019-06-05 ENCOUNTER — Ambulatory Visit (INDEPENDENT_AMBULATORY_CARE_PROVIDER_SITE_OTHER): Payer: Medicare Other | Admitting: Gastroenterology

## 2019-06-05 VITALS — BP 120/80 | HR 64 | Temp 97.6°F | Ht 69.0 in | Wt 211.0 lb

## 2019-06-05 DIAGNOSIS — R748 Abnormal levels of other serum enzymes: Secondary | ICD-10-CM

## 2019-06-05 DIAGNOSIS — Z1159 Encounter for screening for other viral diseases: Secondary | ICD-10-CM

## 2019-06-05 DIAGNOSIS — Z86010 Personal history of colonic polyps: Secondary | ICD-10-CM | POA: Diagnosis not present

## 2019-06-05 DIAGNOSIS — K219 Gastro-esophageal reflux disease without esophagitis: Secondary | ICD-10-CM | POA: Diagnosis not present

## 2019-06-05 MED ORDER — NA SULFATE-K SULFATE-MG SULF 17.5-3.13-1.6 GM/177ML PO SOLN: 1.0000 | Freq: Once | ORAL | 0 refills | Status: AC

## 2019-06-05 NOTE — Progress Notes (Signed)
    History of Present Illness: This is a 68 year old male returning for follow-up of adenomatous colon polyps and a mildly elevated alkaline phosphatase.  Alkaline phosphatase noted to be 147 with an upper limit of normal of 117 in early September.  LFTs otherwise normal.  Alkaline phosphatase isoenzymes were performed with intestinal and liver components being within the normal range.  Abdominal ultrasound as below.  He has no gastrointestinal complaints.  Abd [place] 9/`03/2019 IMPRESSION: 1. Multiple gallstones and sludge noted. Gallbladder wall thickness 2.9 mm. Negative Murphy sign. No biliary distention.  2. Heterogeneous hepatic parenchymal pattern consistent with fatty infiltration with areas of focal fatty sparing.  3. 1.8 cm complex cyst left kidney. Renal MRI should be considered to further evaluate.  Current Medications, Allergies, Past Medical History, Past Surgical History, Family History and Social History were reviewed in [employer] record.   Physical Exam: General: Well developed, well nourished, no acute distress Head: Normocephalic and atraumatic Eyes:  sclerae anicteric, EOMI Ears: Normal auditory acuity Mouth: No deformity or lesions Lungs: Clear throughout to auscultation Heart: Regular rate and rhythm; no murmurs, rubs or bruits Abdomen: Soft, non tender and non distended. No masses, hepatosplenomegaly or hernias noted. Normal Bowel sounds Rectal: Not done Musculoskeletal: Symmetrical with no gross deformities  Pulses:  Normal pulses noted Extremities: No clubbing, cyanosis, edema or deformities noted Neurological: Alert oriented x 4, grossly nonfocal Psychological:  Alert and cooperative. Normal mood and affect   Assessment and Recommendations:  1. Elevated alk phos, etiology unclear.  Liver and intestinal isoenzymes were normal.  There is no obvious gastrointestinal or hepatic cause.  Although he does have fatty liver this typically  causes elevation of transaminases.  He is also maintained on Lipitor which, if it impacts the liver, it typically causes transaminase elevations.  Recommend PCP monitor alkaline phosphatase about every 2 to 3 months and assess trend.  2. Personal history of adenomatous colon polyps.  Multiple adenomatous colon polyps on his last colonoscopy last year with a suboptimal bowel prep.  Plan for an extended bowel prep and repeat colonoscopy.  Schedule colonoscopy. The risks (including bleeding, perforation, infection, missed lesions, medication reactions and possible hospitalization or surgery if complications occur), benefits, and alternatives to colonoscopy with possible biopsy and possible polypectomy were discussed with the patient and they consent to proceed.   3.  Cholelithiasis, asymptomatic.  4.  Fatty liver.  Recommended long-term fat modified, carb modified weight loss diet to achieve normal BMI supervised by his PCP.    5. GERD.  Follow antireflux measures and continue Zegerid 20 mg daily.

## 2019-06-05 NOTE — Telephone Encounter (Signed)
I didn't see that- would just keep as scheduled.  Thanks.

## 2019-06-05 NOTE — Telephone Encounter (Signed)
Pt has a visit with you on 06/27/19 do I still need to call him for an appointment?

## 2019-06-05 NOTE — Telephone Encounter (Signed)
Notify patient.  I got the note from GI.  Recommend rechecking alkaline phosphatase in mid November.  I put in the order.  Needs follow-up nonfasting lab visit here.  Thanks.

## 2019-06-05 NOTE — Patient Instructions (Signed)
You have been scheduled for a colonoscopy. Please follow written instructions given to you at your visit today.  Please pick up your prep supplies at the pharmacy within the next 1-3 days. If you use inhalers (even only as needed), please bring them with you on the day of your procedure. Your physician has requested that you go to www.startemmi.com and enter the access code given to you at your visit today. This web site gives a general overview about your procedure. However, you should still follow specific instructions given to you by our office regarding your preparation for the procedure.  Normal BMI (Body Mass Index- based on height and weight) is between 23 and 30. Your BMI today is Body mass index is 31.16 kg/m. Marland Kitchen Please consider follow up  regarding your BMI with your Primary Care Provider.  Thank you for choosing me and [facility].  Pricilla Riffle. Dagoberto Ligas., MD., Marval Regal

## 2019-06-17 ENCOUNTER — Ambulatory Visit: Payer: Medicare Other | Admitting: Neurology

## 2019-06-18 ENCOUNTER — Other Ambulatory Visit: Payer: Self-pay | Admitting: Gastroenterology

## 2019-06-18 ENCOUNTER — Encounter: Payer: Self-pay | Admitting: Gastroenterology

## 2019-06-19 LAB — SARS CORONAVIRUS 2 (TAT 6-24 HRS): SARS Coronavirus 2: NEGATIVE

## 2019-06-20 ENCOUNTER — Ambulatory Visit (INDEPENDENT_AMBULATORY_CARE_PROVIDER_SITE_OTHER): Payer: Medicare Other

## 2019-06-20 DIAGNOSIS — Z0000 Encounter for general adult medical examination without abnormal findings: Secondary | ICD-10-CM | POA: Diagnosis not present

## 2019-06-20 NOTE — Progress Notes (Signed)
PCP notes:  Health Maintenance: Needs flu vaccine Will check with his insurance about Shingrix Colonoscopy scheduled for 06/21/2019   Abnormal Screenings: none   Patient concerns: none   Nurse concerns: none   Next PCP appt.: 07/04/2019

## 2019-06-20 NOTE — Patient Instructions (Signed)
Mr. Howard [name] , Thank you for taking time to come for your Medicare Wellness Visit. I appreciate your ongoing commitment to your health goals. Please review the following plan we discussed and let me know if I can assist you in the future.   Screening recommendations/referrals: Colonoscopy: scheduled for tomorrow Recommended yearly ophthalmology/optometry visit for glaucoma screening and checkup Recommended yearly dental visit for hygiene and checkup  Vaccinations: Influenza vaccine: will get at next office visit Pneumococcal vaccine: Completed series Tdap vaccine: declined Shingles vaccine: will check with insurance for coverage    Advanced directives: Advance directive discussed with you today. Even though you declined this today please call our office should you change your mind and we can give you the proper paperwork for you to fill out.  Conditions/risks identified: hypertension, hyperlipidemia  Next appointment: 07/04/2019 @ 12 pm  Preventive Care 69 Years and Older, Male Preventive care refers to lifestyle choices and visits with your health care provider that can promote health and wellness. What does preventive care include?  A yearly physical exam. This is also called an annual well check.  Dental exams once or twice a year.  Routine eye exams. Ask your health care provider how often you should have your eyes checked.  Personal lifestyle choices, including:  Daily care of your teeth and gums.  Regular physical activity.  Eating a healthy diet.  Avoiding tobacco and drug use.  Limiting alcohol use.  Practicing safe sex.  Taking low doses of aspirin every day.  Taking vitamin and mineral supplements as recommended by your health care provider. What happens during an annual well check? The services and screenings done by your health care provider during your annual well check will depend on your age, overall health, lifestyle risk factors, and family history of  disease. Counseling  Your health care provider may ask you questions about your:  Alcohol use.  Tobacco use.  Drug use.  Emotional well-being.  Home and relationship well-being.  Sexual activity.  Eating habits.  History of falls.  Memory and ability to understand (cognition).  Work and work Statistician. Screening  You may have the following tests or measurements:  Height, weight, and BMI.  Blood pressure.  Lipid and cholesterol levels. These may be checked every 5 years, or more frequently if you are over 34 years old.  Skin check.  Lung cancer screening. You may have this screening every year starting at age 64 if you have a 30-pack-year history of smoking and currently smoke or have quit within the past 15 years.  Fecal occult blood test (FOBT) of the stool. You may have this test every year starting at age 30.  Flexible sigmoidoscopy or colonoscopy. You may have a sigmoidoscopy every 5 years or a colonoscopy every 10 years starting at age 20.  Prostate cancer screening. Recommendations will vary depending on your family history and other risks.  Hepatitis C blood test.  Hepatitis B blood test.  Sexually transmitted disease (STD) testing.  Diabetes screening. This is done by checking your blood sugar (glucose) after you have not eaten for a while (fasting). You may have this done every 1-3 years.  Abdominal aortic aneurysm (AAA) screening. You may need this if you are a current or former smoker.  Osteoporosis. You may be screened starting at age 63 if you are at high risk. Talk with your health care provider about your test results, treatment options, and if necessary, the need for more tests. Vaccines  Your health care provider  may recommend certain vaccines, such as:  Influenza vaccine. This is recommended every year.  Tetanus, diphtheria, and acellular pertussis (Tdap, Td) vaccine. You may need a Td booster every 10 years.  Zoster vaccine. You may  need this after age 39.  Pneumococcal 13-valent conjugate (PCV13) vaccine. One dose is recommended after age 56.  Pneumococcal polysaccharide (PPSV23) vaccine. One dose is recommended after age 45. Talk to your health care provider about which screenings and vaccines you need and how often you need them. This information is not intended to replace advice given to you by your health care provider. Make sure you discuss any questions you have with your health care provider. Document Released: 08/28/2015 Document Revised: 04/20/2016 Document Reviewed: 06/02/2015 Elsevier Interactive Patient Education  2017 [place] Prevention in the Home Falls can cause injuries. They can happen to people of all ages. There are many things you can do to make your home safe and to help prevent falls. What can I do on the outside of my home?  Regularly fix the edges of walkways and driveways and fix any cracks.  Remove anything that might make you trip as you walk through a door, such as a raised step or threshold.  Trim any bushes or trees on the path to your home.  Use bright outdoor lighting.  Clear any walking paths of anything that might make someone trip, such as rocks or tools.  Regularly check to see if handrails are loose or broken. Make sure that both sides of any steps have handrails.  Any raised decks and porches should have guardrails on the edges.  Have any leaves, snow, or ice cleared regularly.  Use sand or salt on walking paths during winter.  Clean up any spills in your garage right away. This includes oil or grease spills. What can I do in the bathroom?  Use night lights.  Install grab bars by the toilet and in the tub and shower. Do not use towel bars as grab bars.  Use non-skid mats or decals in the tub or shower.  If you need to sit down in the shower, use a plastic, non-slip stool.  Keep the floor dry. Clean up any water that spills on the floor as soon as it  happens.  Remove soap buildup in the tub or shower regularly.  Attach bath mats securely with double-sided non-slip rug tape.  Do not have throw rugs and other things on the floor that can make you trip. What can I do in the bedroom?  Use night lights.  Make sure that you have a light by your bed that is easy to reach.  Do not use any sheets or blankets that are too big for your bed. They should not hang down onto the floor.  Have a firm chair that has side arms. You can use this for support while you get dressed.  Do not have throw rugs and other things on the floor that can make you trip. What can I do in the kitchen?  Clean up any spills right away.  Avoid walking on wet floors.  Keep items that you use a lot in easy-to-reach places.  If you need to reach something above you, use a strong step stool that has a grab bar.  Keep electrical cords out of the way.  Do not use floor polish or wax that makes floors slippery. If you must use wax, use non-skid floor wax.  Do not have throw rugs  and other things on the floor that can make you trip. What can I do with my stairs?  Do not leave any items on the stairs.  Make sure that there are handrails on both sides of the stairs and use them. Fix handrails that are broken or loose. Make sure that handrails are as long as the stairways.  Check any carpeting to make sure that it is firmly attached to the stairs. Fix any carpet that is loose or worn.  Avoid having throw rugs at the top or bottom of the stairs. If you do have throw rugs, attach them to the floor with carpet tape.  Make sure that you have a light switch at the top of the stairs and the bottom of the stairs. If you do not have them, ask someone to add them for you. What else can I do to help prevent falls?  Wear shoes that:  Do not have high heels.  Have rubber bottoms.  Are comfortable and fit you well.  Are closed at the toe. Do not wear sandals.  If you  use a stepladder:  Make sure that it is fully opened. Do not climb a closed stepladder.  Make sure that both sides of the stepladder are locked into place.  Ask someone to hold it for you, if possible.  Clearly mark and make sure that you can see:  Any grab bars or handrails.  First and last steps.  Where the edge of each step is.  Use tools that help you move around (mobility aids) if they are needed. These include:  Canes.  Walkers.  Scooters.  Crutches.  Turn on the lights when you go into a dark area. Replace any light bulbs as soon as they burn out.  Set up your furniture so you have a clear path. Avoid moving your furniture around.  If any of your floors are uneven, fix them.  If there are any pets around you, be aware of where they are.  Review your medicines with your doctor. Some medicines can make you feel dizzy. This can increase your chance of falling. Ask your doctor what other things that you can do to help prevent falls. This information is not intended to replace advice given to you by your health care provider. Make sure you discuss any questions you have with your health care provider. Document Released: 05/28/2009 Document Revised: 01/07/2016 Document Reviewed: 09/05/2014 Elsevier Interactive Patient Education  2017 [employer].

## 2019-06-20 NOTE — Progress Notes (Signed)
Subjective:   Howard [name] is a 68 y.o. male who presents for Medicare Annual/Subsequent preventive examination.  Review of Systems: N/A   This visit is being conducted through telemedicine via telephone at the nurse health advisor's home address due to the COVID-19 pandemic. This patient has given me verbal consent via doximity to conduct this visit, patient states they are participating from their home address. Patient and myself are on the telephone call. There is no referral for this visit. Some vital signs may be absent or patient reported.    Patient identification: identified by name, DOB, and current address   Cardiac Risk Factors include: advanced age (>15men, >33 women);hypertension;dyslipidemia;male gender     Objective:    Vitals: There were no vitals taken for this visit.  There is no height or weight on file to calculate BMI.  Advanced Directives 06/20/2019 05/19/2019 05/10/2019 11/30/2017 04/04/2017 11/25/2016 09/26/2015  Does Patient Have a Medical Advance Directive? No No No No No No No  Would patient like information on creating a medical advance directive? No - Patient declined No - Patient declined - No - Patient declined No - Patient declined No - Patient declined No - patient declined information  Pre-existing out of facility DNR order (yellow form or pink MOST form) - - - - - - -    Tobacco Social History   Tobacco Use  Smoking Status Never Smoker  Smokeless Tobacco Never Used     Counseling given: Not Answered   Clinical Intake:  Pre-visit preparation completed: Yes  Pain : No/denies pain     Nutritional Risks: None Diabetes: No  How often do you need to have someone help you when you read instructions, pamphlets, or other written materials from your doctor or pharmacy?: 1 - Never What is the last grade level you completed in school?: 12th  Interpreter Needed?: No  Information entered by :: CJohnson, LPN  Past Medical History:  Diagnosis  Date  . Anesthesia complication    prolonged sedation after colonoscopy  . Colon polyps    on colonoscopy 08/2010  . ED (erectile dysfunction)   . Essential hypertension 06/25/2008   Qualifier: Diagnosis of  By: Council Mechanic MD, Hilaria Ota   . GERD (gastroesophageal reflux disease)   . HYPERLIPIDEMIA 07/24/2008   Qualifier: Diagnosis of  By: Council Mechanic MD, Hilaria Ota   . Hypersomnia 01/05/2017  . Near syncope 03/14/2017  . Neck pain 12/15/2011  . OSA (obstructive sleep apnea) 11/13/2016   Dr. Elsworth Soho  . Prostate cancer [facility])    s/p prostatectomy  . Sleep apnea 2018  . Thoracic ascending aortic aneurysm ([place]) 04/17/2017   Chest CTA 10/18: Ascending thoracic aneurysm 4.1 cm, aortic root 4.5 cm >> follow-up 1 year // Echo 8/18:  - Ascending aorta: The ascending aorta was moderately dilated, 4.3 cm. Aortic arch is 3.3 cm // CT 01/2019: Ascending aorta 3.8 cm; sinuses of Valsalva 4.5 cm; stable benign pulmonary nodules >> FU 1 year    Past Surgical History:  Procedure Laterality Date  . COLONOSCOPY    . ESOPHAGOGASTRODUODENOSCOPY  10/02/2000   dilated reflux esopsh [facility]  . [facility]: Dehydration; vasvagal sync  12/09/ 05/2003  . PLANTAR FASCIA SURGERY Bilateral   . POLYPECTOMY    . ROBOT ASSISTED LAPAROSCOPIC RADICAL PROSTATECTOMY N/A 05/27/2013   Procedure: ROBOTIC ASSISTED LAPAROSCOPIC RADICAL PROSTATECTOMY LEVEL 1;  Surgeon: Dutch Gray, MD;  Location: [facility];  Service: Urology;  Laterality: N/A;  . UPPER GASTROINTESTINAL ENDOSCOPY  Family History  Problem Relation Age of Onset  . Stroke Mother   . Prostate cancer Neg Hx   . Colon cancer Neg Hx   . Aortic dissection Neg Hx   . Esophageal cancer Neg Hx   . Colon polyps Neg Hx   . Rectal cancer Neg Hx   . Stomach cancer Neg Hx    Social History   Socioeconomic History  . Marital status: Married    Spouse name: Not on file  . Number of children: 3  . Years of education: 27  . Highest education level: Not on file  Occupational History  .  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 Financial resource strain: Not hard at all  . Food insecurity    Worry: Never true    Inability: Never true  . Transportation needs    Medical: No    Non-medical: No  Tobacco Use  . Smoking status: Never Smoker  . Smokeless tobacco: Never Used  Substance and Sexual Activity  . Alcohol use: No    Alcohol/week: 0.0 standard drinks    Comment: no  . Drug use: No  . Sexual activity: Not on file  Lifestyle  . Physical activity    Days per week: 0 days    Minutes per session: 0 min  . Stress: Not at all  Relationships  . Social Herbalist on phone: Not on file    Gets together: Not on file    Attends religious service: Not on file    Active member of club or organization: Not on file    Attends meetings of clubs or organizations: Not on file    Relationship status: Not on file  Other Topics Concern  . Not on file  Social History Narrative   Retired [employer], parttime bailiff- laid off as of 2018.    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     Married 1976, lives with wife. 1 step daughter, 2 daughters and 1 son.   [place] [employer] fan   Left handed    Outpatient Encounter Medications as of 06/20/2019  Medication Sig  . aspirin EC 81 MG tablet Take 1 tablet (81 mg total) by mouth daily.  Marland Kitchen atorvastatin (LIPITOR) 20 MG tablet Take 1 tablet (20 mg total) by mouth daily.  . cholecalciferol (VITAMIN D) 1000 units tablet Take 1 tablet (1,000 Units total) by mouth daily.  . metoprolol succinate (TOPROL-XL) 25 MG 24 hr tablet Take 1 tablet (25 mg total) by mouth daily. Please make yearly appt with Dr. Burt Knack for December before anymore refills. 1st attempt  . nitroGLYCERIN (NITROSTAT) 0.4 MG SL tablet Place 1 tablet (0.4 mg total) under the tongue every 5 (five) minutes as needed for chest pain.  Marland Kitchen Omeprazole-Sodium Bicarbonate (ZEGERID OTC) 20-1100 MG CAPS Take 1 capsule by mouth daily before  breakfast.   No facility-administered encounter medications on file as of 06/20/2019.     Activities of Daily Living In your present state of health, do you have any difficulty performing the following activities: 06/20/2019  Hearing? N  Vision? N  Difficulty concentrating or making decisions? N  Walking or climbing stairs? N  Dressing or bathing? N  Doing errands, shopping? N  Preparing Food and eating ? N  Using the Toilet? N  In the past six months, have you accidently leaked urine? N  Do you have problems with loss of bowel control? N  Managing  your Medications? N  Managing your Finances? N  Housekeeping or managing your Housekeeping? N  Some recent data might be hidden    Patient Care Team: Tonia Ghent, MD as PCP - General (Family Medicine) Sherren Mocha, MD as PCP - Cardiology (Cardiology) Alda Berthold, DO as Consulting Physician (Neurology)   Assessment:   This is a routine wellness examination for Howard [name].  Exercise Activities and Dietary recommendations Current Exercise Habits: The patient has a physically strenuous job, but has no regular exercise apart from work., Exercise limited by: None identified  Goals    . Follow up with Primary Care Provider     Starting 11/30/2017, I will continue to take medications as prescribed and to keep appointments with PCP as scheduled.     . Increase physical activity     Starting 11/25/2016, I will continue to walk at least 2 miles 3 days per week.     . Patient Stated     06/20/2019, I will work on trying to improve my arthritis pain.       Fall Risk Fall Risk  06/20/2019 05/10/2019 11/30/2017 11/25/2016 08/27/2015  Falls in the past year? 0 0 No No Yes  Number falls in past yr: 0 0 - - 1  Injury with Fall? 0 0 - - Yes  Risk for fall due to : Medication side effect - - - -  Follow up Falls evaluation completed;Falls prevention discussed - - - -   Is the patient's home free of loose throw rugs in walkways, pet beds,  electrical cords, etc?   yes      Grab bars in the bathroom? no      Handrails on the stairs?   yes      Adequate lighting?   yes  Timed Get Up and Go Performed: N/A  Depression Screen PHQ 2/9 Scores 06/20/2019 11/30/2017 11/25/2016 08/27/2015  PHQ - 2 Score 0 0 0 0  PHQ- 9 Score 0 0 - -    Cognitive Function MMSE - Mini Mental State Exam 06/20/2019 11/30/2017 11/25/2016  Orientation to time 5 5 5   Orientation to Place 5 5 5   Registration 3 3 3   Attention/ Calculation 5 0 0  Recall 3 2 3   Recall-comments - unable to recall 1 of 3 words -  Language- name 2 objects - 0 0  Language- repeat 1 1 1   Language- follow 3 step command - 3 3  Language- read & follow direction - 0 0  Write a sentence - 0 0  Copy design - 0 0  Total score - 19 20  Mini Cog  Mini-Cog screen was completed. Maximum score is 22. A value of 0 denotes this part of the MMSE was not completed or the patient failed this part of the Mini-Cog screening.       Immunization History  Administered Date(s) Administered  . Influenza Split 06/08/2011  . Influenza, High Dose Seasonal PF 06/02/2017  . Influenza,inj,Quad PF,6+ Mos 04/20/2016, 06/21/2018  . Pneumococcal Conjugate-13 08/27/2015  . Pneumococcal Polysaccharide-23 12/01/2016  . Td 06/25/2008  . Zoster 07/05/2010    Qualifies for Shingles Vaccine? Yes  Screening Tests Health Maintenance  Topic Date Due  . INFLUENZA VACCINE  03/16/2019  . COLONOSCOPY  04/06/2019  . TETANUS/TDAP  06/19/2020 (Originally 06/25/2018)  . Hepatitis C Screening  Completed  . PNA vac Low Risk Adult  Completed   Cancer Screenings: Lung: Low Dose CT Chest recommended if Age 101-80 years, 30 pack-year  currently smoking OR have quit w/in 15years. Patient does not qualify. Colorectal: scheduled for 06/21/2019  Additional Screenings:  Hepatitis C Screening: 08/27/2015      Plan:    Patient wants to work on improving his arthritis.   I have personally reviewed and noted the  following in the patient's chart:   . Medical and social history . Use of alcohol, tobacco or illicit drugs  . Current medications and supplements . Functional ability and status . Nutritional status . Physical activity . Advanced directives . List of other physicians . Hospitalizations, surgeries, and ER visits in previous 12 months . Vitals . Screenings to include cognitive, depression, and falls . Referrals and appointments  In addition, I have reviewed and discussed with patient certain preventive protocols, quality metrics, and best practice recommendations. A written personalized care plan for preventive services as well as general preventive health recommendations were provided to patient.     Andrez Grime, LPN  [id number]

## 2019-06-21 ENCOUNTER — Other Ambulatory Visit: Payer: Self-pay

## 2019-06-21 ENCOUNTER — Ambulatory Visit (AMBULATORY_SURGERY_CENTER): Payer: Medicare Other | Admitting: Gastroenterology

## 2019-06-21 ENCOUNTER — Encounter: Payer: Self-pay | Admitting: Gastroenterology

## 2019-06-21 VITALS — BP 125/80 | HR 53 | Temp 98.1°F | Resp 13 | Ht 69.0 in | Wt 211.0 lb

## 2019-06-21 DIAGNOSIS — D128 Benign neoplasm of rectum: Secondary | ICD-10-CM | POA: Diagnosis not present

## 2019-06-21 DIAGNOSIS — Z86010 Personal history of colonic polyps: Secondary | ICD-10-CM

## 2019-06-21 DIAGNOSIS — D123 Benign neoplasm of transverse colon: Secondary | ICD-10-CM

## 2019-06-21 MED ORDER — SODIUM CHLORIDE 0.9 % IV SOLN: 500.0000 mL | INTRAVENOUS | Status: DC

## 2019-06-21 NOTE — Progress Notes (Signed)
No problems noted in the recovery room. maw 

## 2019-06-21 NOTE — Progress Notes (Signed)
Temp JB V/S CW 

## 2019-06-21 NOTE — Patient Instructions (Signed)
YOU HAD AN ENDOSCOPIC PROCEDURE TODAY AT [facility]:   Refer to the procedure report that was given to you for any specific questions about what was found during the examination.  If the procedure report does not answer your questions, please call your gastroenterologist to clarify.  If you requested that your care partner not be given the details of your procedure findings, then the procedure report has been included in a sealed envelope for you to review at your convenience later.  YOU SHOULD EXPECT: Some feelings of bloating in the abdomen. Passage of more gas than usual.  Walking can help get rid of the air that was put into your GI tract during the procedure and reduce the bloating. If you had a lower endoscopy (such as a colonoscopy or flexible sigmoidoscopy) you may notice spotting of blood in your stool or on the toilet paper. If you underwent a bowel prep for your procedure, you may not have a normal bowel movement for a few days.  Please Note:  You might notice some irritation and congestion in your nose or some drainage.  This is from the oxygen used during your procedure.  There is no need for concern and it should clear up in a day or so.  SYMPTOMS TO REPORT IMMEDIATELY:   Following lower endoscopy (colonoscopy or flexible sigmoidoscopy):  Excessive amounts of blood in the stool  Significant tenderness or worsening of abdominal pains  Swelling of the abdomen that is new, acute  Fever of 100F or higher  For urgent or emergent issues, a gastroenterologist can be reached at any hour by calling [phone].   DIET:  We do recommend a small meal at first, but then you may proceed to your regular diet.  Drink plenty of fluids but you should avoid alcoholic beverages for 24 hours.  ACTIVITY:  You should plan to take it easy for the rest of today and you should NOT DRIVE or use heavy machinery until tomorrow (because of the sedation medicines used during the test).     FOLLOW UP: Our staff will call the number listed on your records 48-72 hours following your procedure to check on you and address any questions or concerns that you may have regarding the information given to you following your procedure. If we do not reach you, we will leave a message.  We will attempt to reach you two times.  During this call, we will ask if you have developed any symptoms of COVID 19. If you develop any symptoms (ie: fever, flu-like symptoms, shortness of breath, cough etc.) before then, please call [phone].  If you test positive for Covid 19 in the 2 weeks post procedure, please call and report this information to [place].    If any biopsies were taken you will be contacted by phone or by letter within the next 1-3 weeks.  Please call us at [phone] if you have not heard about the biopsies in 3 weeks.    SIGNATURES/CONFIDENTIALITY: You and/or your care partner have signed paperwork which will be entered into your electronic medical record.  These signatures attest to the fact that that the information above on your After Visit Summary has been reviewed and is understood.  Full responsibility of the confidentiality of this discharge information lies with you and/or your care-partner.     Handouts were given to you on polyps and hemorrhoids. You may resume your current medications today. Await biopsy results. Repeat colonoscopy in 3 years  with an extended bowel prep. Please call if any questions or concerns.

## 2019-06-21 NOTE — Progress Notes (Signed)
To PACU, VSS. Report to [email or web address] 

## 2019-06-21 NOTE — Progress Notes (Signed)
Called to room to assist during endoscopic procedure.  Patient ID and intended procedure confirmed with present staff. Received instructions for my participation in the procedure from the performing physician.  

## 2019-06-21 NOTE — Op Note (Signed)
[place] Patient Name: Howard [name] Procedure Date: 06/21/2019 1:20 PM MRN: [id number] Endoscopist: Ladene Artist , MD Age: 68 Referring MD:  Date of Birth: Feb 21, 1951 Gender: Male Account #: [id number] Procedure:                Colonoscopy Indications:              Surveillance: History of multiple adenomatous                            polyps and one tubulovillous adenoma, suboptimal                            prep on last exam (<48yr) Medicines:                Monitored Anesthesia Care Procedure:                Pre-Anesthesia Assessment:                           - Prior to the procedure, a History and Physical                            was performed, and patient medications and                            allergies were reviewed. The patient's tolerance of                            previous anesthesia was also reviewed. The risks                            and benefits of the procedure and the sedation                            options and risks were discussed with the patient.                            All questions were answered, and informed consent                            was obtained. Prior Anticoagulants: The patient has                            taken no previous anticoagulant or antiplatelet                            agents. ASA Grade Assessment: II - A patient with                            mild systemic disease. After reviewing the risks                            and benefits, the patient was deemed in  satisfactory condition to undergo the procedure.                           After obtaining informed consent, the colonoscope                            was passed under direct vision. Throughout the                            procedure, the patient's blood pressure, pulse, and                            oxygen saturations were monitored continuously. The                            Colonoscope was introduced through the anus  and                            advanced to the the cecum, identified by                            appendiceal orifice and ileocecal valve. The                            ileocecal valve, appendiceal orifice, and rectum                            were photographed. The quality of the bowel                            preparation was excellent. The colonoscopy was                            performed without difficulty. The patient tolerated                            the procedure well. Scope In: 1:41:32 PM Scope Out: 1:59:36 PM Scope Withdrawal Time: 0 hours 15 minutes 40 seconds  Total Procedure Duration: 0 hours 18 minutes 4 seconds  Findings:                 The perianal and digital rectal examinations were                            normal.                           Three sessile polyps were found in the rectum (1)                            and transverse colon (2). The polyps were 6 to 7 mm                            in size. These polyps were removed with a cold  snare. Resection and retrieval were complete.                           There was a medium-sized lipoma, 15 mm in diameter,                            in the transverse colon.                           Internal hemorrhoids were found during                            retroflexion. The hemorrhoids were medium-sized.                           The exam was otherwise without abnormality on                            direct and retroflexion views. Complications:            No immediate complications. Estimated blood loss:                            None. Estimated Blood Loss:     Estimated blood loss: none. Impression:               - Three 6 to 7 mm polyps in the rectum and in the                            transverse colon, removed with a cold snare.                            Resected and retrieved.                           - Lipoma, transverse colon.                           - Internal  hemorrhoids.                           - The examination was otherwise normal on direct                            and retroflexion views. Recommendation:           - Repeat colonoscopy in 3 years for surveillance                            with an extended bowel prep.                           - Patient has a contact number available for                            emergencies. The signs and symptoms of potential  delayed complications were discussed with the                            patient. Return to normal activities tomorrow.                            Written discharge instructions were provided to the                            patient.                           - Resume previous diet.                           - Continue present medications.                           - Await pathology results. Ladene Artist, MD 06/21/2019 2:07:07 PM This report has been signed electronically.

## 2019-06-24 ENCOUNTER — Other Ambulatory Visit: Payer: Self-pay

## 2019-06-24 DIAGNOSIS — R202 Paresthesia of skin: Secondary | ICD-10-CM

## 2019-06-25 ENCOUNTER — Telehealth: Payer: Self-pay

## 2019-06-25 NOTE — Telephone Encounter (Signed)
  Follow up Call-  Call back number 06/21/2019 04/05/2018  Post procedure Call Back phone  # [phone]  Permission to leave phone message Yes Yes  Some recent data might be hidden     Patient questions:  Do you have a fever, pain , or abdominal swelling? No. Pain Score  0 *  Have you tolerated food without any problems? Yes.    Have you been able to return to your normal activities? Yes.    Do you have any questions about your discharge instructions: Diet   No. Medications  No. Follow up visit  No.  Do you have questions or concerns about your Care? No.  Actions: * If pain score is 4 or above: No action needed, pain <4.  Pt states concern about not having a bm yet.  Advised to increase fruits and vegetables in diet and that it can take a few days to return to normal.  Pt agreed.  1. Have you developed a fever since your procedure? no  2.   Have you had an respiratory symptoms (SOB or cough) since your procedure? no  3.   Have you tested positive for COVID 19 since your procedure no  4.   Have you had any family members/close contacts diagnosed with the COVID 19 since your procedure?  no   If yes to any of these questions please route to Joylene John, RN and Alphonsa Gin, Therapist, sports.

## 2019-06-27 ENCOUNTER — Encounter: Payer: Medicare Other | Admitting: Family Medicine

## 2019-06-27 ENCOUNTER — Other Ambulatory Visit: Payer: Self-pay

## 2019-06-27 ENCOUNTER — Ambulatory Visit (INDEPENDENT_AMBULATORY_CARE_PROVIDER_SITE_OTHER): Payer: Medicare Other | Admitting: Neurology

## 2019-06-27 ENCOUNTER — Telehealth: Payer: Self-pay | Admitting: Neurology

## 2019-06-27 DIAGNOSIS — R202 Paresthesia of skin: Secondary | ICD-10-CM | POA: Diagnosis not present

## 2019-06-27 NOTE — Telephone Encounter (Signed)
Results of the EMG discussed with patient which shows left common peroneal mononeuropathy and bilateral L2-L3 radiculopathy, both of which are mild.  Recommend conservative therapy with avoidance of crossing legs and physical therapy for low back strengthening.  He would follow-up with me in 3 months or sooner if symptoms change.

## 2019-06-27 NOTE — Procedures (Signed)
[facility] Neurology  [place], [address]  [place], [place] [address] Tel: [phone] Fax:  [phone] Test Date:  06/27/2019  Patient: Howard [name] DOB: Oct 07, 1950 Physician: Narda Amber, DO  Sex: Male Height: 5\' 9"  Ref Phys: Narda Amber, DO  ID#: [id number] Temp: 34.0C Technician:    Patient Complaints: This is a 68 year old man referred for evaluation of left foot drop and bilateral thigh pain.  NCV & EMG Findings: Extensive electrodiagnostic testing of the left lower extremity and additional studies of the right shows:  1. Bilateral superficial peroneal and left sural sensory responses are within normal limits. 2. Left peroneal motor response shows markedly reduced amplitude at the extensor digitorum brevis and decreased conduction velocity (Poplt-B Fib, 29 m/s).  Left peroneal motor response at the tibialis anterior shows asymmetrically reduced amplitude compared to the right and mild conduction velocity slowing across the fibular neck (Poplit-Fib Head, 35 m/s).  Right peroneal and left tibial motor responses are within normal limits.   3. Left tibial H reflex study is within normal limits.   4. Reduced recruitment pattern and active denervation is present in the left anterior tibialis and extensor hallucis longus muscles.  Additionally, sparse chronic motor axonal loss changes are seen affecting bilateral rectus femoris and left abductor longus muscles, without accompanied active denervation.    Impression: 1. 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   2. 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7 >2.5 B Fib Ankle 8.0 38.0 48 >40  B Fib    13.9  0.7  Poplt B Fib 3.1 9.0 29 >40  Poplt    17.0  0.7         Right Peroneal Motor (Ext Dig Brev)  34C  Ankle    3.9 <6.0 6.1 >2.5 B Fib Ankle 8.1 34.0 42 >40  B Fib    12.0  5.0  Poplt B Fib 2.1 9.0 43 >40  Poplt    14.1  4.9         Left Peroneal TA Motor (Tib Ant)  34C  Fib Head    2.5 <4.5 3.7 >3 Poplit Fib Head 2.0 7.0 35 >40  Poplit    4.5  3.5         Right Peroneal TA Motor (Tib Ant)  34C  Fib Head    3.0 <4.5 5.5 >3 Poplit Fib Head 1.2 8.0 67 >40  Poplit    4.2  5.4         Left Tibial Motor (Abd Hall Brev)  34C  Ankle    3.3 <6.0 5.4 >4 Knee Ankle 9.4  39.0 41 >40  Knee    12.7  3.5          H Reflex Studies   NR H-Lat (ms) Lat Norm (ms) L-R H-Lat (ms)  Left Tibial (Gastroc)  34C     34.29 <35    EMG   Side Muscle Ins Act Fibs Psw Fasc Number Recrt Dur Dur. Amp Amp. Poly Poly. Comment  Left AntTibialis Nml 1+ Nml Nml 1- Rapid Nml Nml Nml Nml Nml Nml N/A  Left ExtHallLong Nml 1+ Nml Nml 1- Rapid Nml Nml Nml Nml Nml Nml N/A  Left RectFemoris Nml Nml Nml Nml 1- Rapid Some 1+ Some 1+ Nml Nml N/A  Left AdductorLong Nml Nml Nml Nml 1- Rapid Few 1+ Few 1+ Nml Nml N/A  Right RectFemoris Nml Nml Nml Nml 1- Rapid Few 1+ Few 1+ Nml Nml N/A  Left GluteusMed Nml Nml Nml Nml Nml Nml Nml Nml Nml Nml Nml Nml N/A  Left Fibularis Long Nml Nml Nml Nml Nml Nml Nml Nml Nml Nml Nml Nml N/A  Left Gastroc Nml Nml Nml Nml Nml Nml Nml Nml Nml Nml Nml Nml N/A  Right AntTibialis Nml Nml Nml Nml Nml Nml Nml Nml Nml Nml Nml Nml N/A  Right Gastroc Nml Nml Nml Nml Nml Nml Nml Nml Nml Nml Nml Nml N/A   Left Flex Dig Long Nml Nml Nml Nml Nml Nml Nml Nml Nml Nml Nml Nml N/A      Waveforms:

## 2019-06-28 ENCOUNTER — Encounter: Payer: Self-pay | Admitting: Gastroenterology

## 2019-07-04 ENCOUNTER — Encounter: Payer: Self-pay | Admitting: Family Medicine

## 2019-07-04 ENCOUNTER — Ambulatory Visit (INDEPENDENT_AMBULATORY_CARE_PROVIDER_SITE_OTHER): Payer: Medicare Other | Admitting: Family Medicine

## 2019-07-04 ENCOUNTER — Telehealth: Payer: Self-pay

## 2019-07-04 ENCOUNTER — Other Ambulatory Visit: Payer: Self-pay

## 2019-07-04 VITALS — BP 124/88 | HR 63 | Temp 97.2°F | Ht 69.0 in | Wt 209.4 lb

## 2019-07-04 DIAGNOSIS — R531 Weakness: Secondary | ICD-10-CM

## 2019-07-04 DIAGNOSIS — R748 Abnormal levels of other serum enzymes: Secondary | ICD-10-CM

## 2019-07-04 DIAGNOSIS — G4733 Obstructive sleep apnea (adult) (pediatric): Secondary | ICD-10-CM | POA: Diagnosis not present

## 2019-07-04 DIAGNOSIS — I712 Thoracic aortic aneurysm, without rupture: Secondary | ICD-10-CM

## 2019-07-04 DIAGNOSIS — Z23 Encounter for immunization: Secondary | ICD-10-CM

## 2019-07-04 DIAGNOSIS — I480 Paroxysmal atrial fibrillation: Secondary | ICD-10-CM | POA: Diagnosis not present

## 2019-07-04 DIAGNOSIS — I7121 Aneurysm of the ascending aorta, without rupture: Secondary | ICD-10-CM

## 2019-07-04 DIAGNOSIS — Z7189 Other specified counseling: Secondary | ICD-10-CM

## 2019-07-04 DIAGNOSIS — E785 Hyperlipidemia, unspecified: Secondary | ICD-10-CM

## 2019-07-04 DIAGNOSIS — Z0000 Encounter for general adult medical examination without abnormal findings: Secondary | ICD-10-CM

## 2019-07-04 LAB — HEPATIC FUNCTION PANEL
ALT: 22 U/L (ref 0–53)
AST: 28 U/L (ref 0–37)
Albumin: 4.3 g/dL (ref 3.5–5.2)
Alkaline Phosphatase: 154 U/L — ABNORMAL HIGH (ref 39–117)
Bilirubin, Direct: 0.1 mg/dL (ref 0.0–0.3)
Total Bilirubin: 0.6 mg/dL (ref 0.2–1.2)
Total Protein: 7.6 g/dL (ref 6.0–8.3)

## 2019-07-04 NOTE — Patient Instructions (Addendum)
Check with your insurance to see if they will cover the shingrix and tetanus shots.  They may be cheaper at the pharmacy.   We'll call about getting you an appointment with Dr. Burt Knack.  Take care.  Glad to see you.   Go to the lab on the way out.  We'll contact you with your lab report.

## 2019-07-04 NOTE — Progress Notes (Signed)
This visit occurred during the SARS-CoV-2 public health emergency.  Safety protocols were in place, including screening questions prior to the visit, additional usage of staff PPE, and extensive cleaning of exam room while observing appropriate contact time as indicated for disinfecting solutions.   Elevated Cholesterol: Using medications without problems:yes Muscle aches: no Diet compliance: yes Exercise:yes Labs d/w pt.   PAF. He had a brief episode noted on stress testing.  No known episodes since.  No CP.  Still on metoprolol at baseline.   Using medication without problems or lightheadedness:  yes Chest pain with exertion:no Edema:no Short of breath:no  Thoracic aortic aneurysm.  Prev imaging d/w pt:  1. The tubular ascending aorta measures 3.8 x 3.7 cm, unchanged on examinations dating back to 06/01/2017 when measured similarly. The aortic valve measures 2.5 cm and the sinuses of Valsalva measure 4.5 cm, likewise unchanged. The aortic arch and descending thoracic aorta are normal in caliber. No significant atherosclerosis. 2.  Coronary artery disease. 3.  Stable, benign small pulmonary nodules.  OSA on mask, compliant.  Waking rested, not snoring with use.  PSA per urology.  I'll defer, he agrees.  Colonoscopy 2020 Flu shot 2020.   PNA up to date.  Shingles and tetanus d/w pt.  Advance directive- wife designated if patient were incapacitated.   Diet and exercise d/w pt.    Paresthesia per neurology.  He had nerve conduction study done. 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  He'll f/u with neurology.  I'll defer.  He agrees.   Prev MR liver:  1. In addition to a small Bosniak category 2 cyst in the left mid kidney, there is some mild scarring  in the left mid kidney. Additional small Bosniak category 1 cysts of the left kidney are identified. No worrisome renal lesions. 2. Small hepatic hemangioma. 3. Cholelithiasis. 4. Descending colon diverticulosis. 5. Mild diffuse hepatic steatosis.  Recheck LFTs pending.  See notes on labs.  Meds, vitals, and allergies reviewed.   PMH and SH reviewed  ROS: Per HPI unless specifically indicated in ROS section   GEN: nad, alert and oriented HEENT: ncat NECK: supple w/o LA CV: rrr. PULM: ctab, no inc wob ABD: soft, +bs EXT: no edema SKIN: no acute rash

## 2019-07-04 NOTE — Telephone Encounter (Signed)
Pt doesn't want to be seen in [place].  Pt to be assigned to new cardiologist in [place].  Per Dr. Antionette Char scheduler, Dr. Burt Knack is only seeing TAVR pts.  Pt is listed as being on Dr. Antionette Char "care team".  She is reaching out to Dr. Burt Knack to see who to place pt with on Dr. Antionette Char team.  They will reach out to pt to schedule appt.  Spoke w/pt.  He is aware Cardiology will reach out to him to schedule.

## 2019-07-05 NOTE — Telephone Encounter (Signed)
Thanks

## 2019-07-07 ENCOUNTER — Other Ambulatory Visit: Payer: Self-pay | Admitting: Family Medicine

## 2019-07-07 DIAGNOSIS — R748 Abnormal levels of other serum enzymes: Secondary | ICD-10-CM

## 2019-07-07 NOTE — Assessment & Plan Note (Signed)
No recent symptoms in the meantime.  Continue as is.  He agrees.  See above.

## 2019-07-07 NOTE — Assessment & Plan Note (Signed)
OSA on mask, compliant.  Waking rested, not snoring with use.  Continue with this.  He agrees.

## 2019-07-07 NOTE — Assessment & Plan Note (Signed)
PSA per urology.  I'll defer, he agrees.  Colonoscopy 2020 Flu shot 2020.   PNA up to date.  Shingles and tetanus d/w pt.  Advance directive- wife designated if patient were incapacitated.   Diet and exercise d/w pt.

## 2019-07-07 NOTE — Assessment & Plan Note (Addendum)
Recheck LFTs pending.  See notes on labs.  1. In addition to a small Bosniak category 2 cyst in the left mid kidney, there is some mild scarring in the left mid kidney. Additional small Bosniak category 1 cysts of the left kidney are identified. No worrisome renal lesions. 2. Small hepatic hemangioma. 3. Cholelithiasis. 4. Descending colon diverticulosis. 5. Mild diffuse hepatic steatosis.

## 2019-07-07 NOTE — Assessment & Plan Note (Signed)
I want to address his alkaline phosphatase elevation before we change his statin.  See notes on labs.

## 2019-07-07 NOTE — Assessment & Plan Note (Signed)
Advance directive- wife designated if patient were incapacitated.  

## 2019-07-07 NOTE — Assessment & Plan Note (Signed)
His foot drop is better and he is working on [employer].  He'll f/u with neurology.  I'll defer.  He agrees.

## 2019-07-07 NOTE — Assessment & Plan Note (Signed)
Thoracic aortic aneurysm.  Prev imaging d/w pt:  1. The tubular ascending aorta measures 3.8 x 3.7 cm, unchanged on examinations dating back to 06/01/2017 when measured similarly. The aortic valve measures 2.5 cm and the sinuses of Valsalva measure 4.5 cm, likewise unchanged. The aortic arch and descending thoracic aorta are normal in caliber. No significant atherosclerosis. 2.  Coronary artery disease. 3.  Stable, benign small pulmonary nodules.  He has follow-up planned by cardiology.  Discussed controlling lipids and blood pressure.  I want to address his alkaline phosphatase elevation before we change his statin.

## 2019-08-28 ENCOUNTER — Other Ambulatory Visit: Payer: Self-pay | Admitting: Physician Assistant

## 2019-08-29 ENCOUNTER — Ambulatory Visit (INDEPENDENT_AMBULATORY_CARE_PROVIDER_SITE_OTHER): Payer: Medicare Other | Admitting: Cardiology

## 2019-08-29 ENCOUNTER — Other Ambulatory Visit: Payer: Self-pay

## 2019-08-29 ENCOUNTER — Encounter: Payer: Self-pay | Admitting: Cardiology

## 2019-08-29 VITALS — BP 126/80 | HR 51 | Ht 69.0 in | Wt 207.0 lb

## 2019-08-29 DIAGNOSIS — I2510 Atherosclerotic heart disease of native coronary artery without angina pectoris: Secondary | ICD-10-CM

## 2019-08-29 DIAGNOSIS — I77810 Thoracic aortic ectasia: Secondary | ICD-10-CM

## 2019-08-29 DIAGNOSIS — I491 Atrial premature depolarization: Secondary | ICD-10-CM | POA: Diagnosis not present

## 2019-08-29 DIAGNOSIS — I712 Thoracic aortic aneurysm, without rupture: Secondary | ICD-10-CM | POA: Diagnosis not present

## 2019-08-29 DIAGNOSIS — I7121 Aneurysm of the ascending aorta, without rupture: Secondary | ICD-10-CM

## 2019-08-29 DIAGNOSIS — E785 Hyperlipidemia, unspecified: Secondary | ICD-10-CM

## 2019-08-29 NOTE — Patient Instructions (Signed)
Medication Instructions:   Your physician recommends that you continue on your current medications as directed. Please refer to the Current Medication list given to you today.  *If you need a refill on your cardiac medications before your next appointment, please call your pharmacy*   Testing/Procedures:  Your physician has requested that you have an echocardiogram. Echocardiography is a painless test that uses sound waves to create images of your heart. It provides your doctor with information about the size and shape of your heart and how well your heart's chambers and valves are working. This procedure takes approximately one hour. There are no restrictions for this procedure.  PER DR. Marlou Porch HE WOULD LIKE HIS ECHO SCHEDULED FOR 6 MONTHS OUT, PRIOR TO SEE SCOTT WEAVER PA-C IN 6 MONTHS     Follow-Up:  6 MONTHS WITH SCOTT WEAVER PA-C WITH AN ECHO PRIOR TO THIS VISIT.   At [facility], you and your health needs are our priority.  As part of our continuing mission to provide you with exceptional heart care, we have created designated Provider Care Teams.  These Care Teams include your primary Cardiologist (physician) and Advanced Practice Providers (APPs -  Physician Assistants and Nurse Practitioners) who all work together to provide you with the care you need, when you need it.  Your next appointment:   12 month(s)  The format for your next appointment:   In Person  Provider:   Candee Furbish, MD

## 2019-08-29 NOTE — Progress Notes (Signed)
Cardiology Office Note:    Date:  08/29/2019   ID:  Howard [name], Howard [name] [birth year], MRN [id number]  PCP:  Tonia Ghent, MD  Cardiologist:  Candee Furbish, MD  Electrophysiologist:  None   Referring MD: Tonia Ghent, MD     History of Present Illness:    Howard [name] is a 69 y.o. male with hypertension thoracic aortic aneurysm coronary calcification here for follow-up.  Former Dr. And patient.  Stress test July 2019 showed no ischemia.  Atrial fibrillation was discovered during the stress test.  He had previously PACs which were symptomatic.  Placed on beta-blocker.  He had brief atrial runs of atrial tachycardia on event monitor.  Mild CP 1 minute  Past Medical History:  Diagnosis Date  . Anesthesia complication    prolonged sedation after colonoscopy  . Colon polyps    on colonoscopy 08/2010  . ED (erectile dysfunction)   . Essential hypertension 06/25/2008   Qualifier: Diagnosis of  By: Council Mechanic MD, Hilaria Ota   . GERD (gastroesophageal reflux disease)   . HYPERLIPIDEMIA 07/24/2008   Qualifier: Diagnosis of  By: Council Mechanic MD, Hilaria Ota   . Hypersomnia 01/05/2017  . Near syncope 03/14/2017  . Neck pain 12/15/2011  . OSA (obstructive sleep apnea) 11/13/2016   Dr. Elsworth Soho  . Prostate cancer [facility])    s/p prostatectomy  . Sleep apnea 2018  . Thoracic ascending aortic aneurysm ([place]) 04/17/2017   Chest CTA 10/18: Ascending thoracic aneurysm 4.1 cm, aortic root 4.5 cm >> follow-up 1 year // Echo 8/18:  - Ascending aorta: The ascending aorta was moderately dilated, 4.3 cm. Aortic arch is 3.3 cm // CT 01/2019: Ascending aorta 3.8 cm; sinuses of Valsalva 4.5 cm; stable benign pulmonary nodules >> FU 1 year     Past Surgical History:  Procedure Laterality Date  . COLONOSCOPY    . ESOPHAGOGASTRODUODENOSCOPY  10/02/2000   dilated reflux esopsh hh  . MCH: Dehydration; vasvagal sync  12/09/ 05/2003  . PLANTAR FASCIA SURGERY Bilateral   . POLYPECTOMY    . ROBOT ASSISTED  LAPAROSCOPIC RADICAL PROSTATECTOMY N/A 05/27/2013   Procedure: ROBOTIC ASSISTED LAPAROSCOPIC RADICAL PROSTATECTOMY LEVEL 1;  Surgeon: Dutch Gray, MD;  Location: [facility] ORS;  Service: Urology;  Laterality: N/A;  . UPPER GASTROINTESTINAL ENDOSCOPY      Current Medications: Current Meds  Medication Sig  . aspirin EC 81 MG tablet Take 1 tablet (81 mg total) by mouth daily.  Marland Kitchen atorvastatin (LIPITOR) 20 MG tablet Take 1 tablet (20 mg total) by mouth daily.  . cholecalciferol (VITAMIN D) 1000 units tablet Take 1 tablet (1,000 Units total) by mouth daily.  . metoprolol succinate (TOPROL-XL) 25 MG 24 hr tablet Take 1 tablet (25 mg total) by mouth daily. Please make yearly appt with Dr. Burt Knack for December before anymore refills. 1st attempt  . nitroGLYCERIN (NITROSTAT) 0.4 MG SL tablet Place 1 tablet (0.4 mg total) under the tongue every 5 (five) minutes as needed for chest pain.  Marland Kitchen Omeprazole-Sodium Bicarbonate (ZEGERID OTC) 20-1100 MG CAPS Take 1 capsule by mouth daily before breakfast.     Allergies:   Ibuprofen   Social History   Socioeconomic History  . Marital status: Married    Spouse name: Not on file  . Number of children: 3  . Years of education: 27  . Highest education level: Not on file  Occupational History  . 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 Smoking  status: Never Smoker  . Smokeless tobacco: Never Used  Substance and Sexual Activity  . Alcohol use: No    Alcohol/week: 0.0 standard drinks    Comment: no  . Drug use: No  . Sexual activity: Not on file  Other Topics Concern  . Not on file  Social History Narrative   Retired [employer], parttime bailiff- laid off as of 2018.    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     Married 1976, lives with wife. 1 step daughter, 2 daughters and 1 son.   [place] [employer] fan   Left handed   Social Determinants of Health   Financial Resource Strain: Low Risk   . Difficulty of Paying Living  Expenses: Not hard at all  Food Insecurity: No Food Insecurity  . Worried About Charity fundraiser in the Last Year: Never true  . Ran Out of Food in the Last Year: Never true  Transportation Needs: No Transportation Needs  . Lack of Transportation (Medical): No  . Lack of Transportation (Non-Medical): No  Physical Activity: Inactive  . Days of Exercise per Week: 0 days  . Minutes of Exercise per Session: 0 min  Stress: No Stress Concern Present  . Feeling of Stress : Not at all  Social Connections:   . Frequency of Communication with Friends and Family: Not on file  . Frequency of Social Gatherings with Friends and Family: Not on file  . Attends Religious Services: Not on file  . Active Member of Clubs or Organizations: Not on file  . Attends Archivist Meetings: Not on file  . Marital Status: Not on file     Family History: The patient's family history includes Stroke in his mother. There is no history of Prostate cancer, Colon cancer, Aortic dissection, Esophageal cancer, Colon polyps, Rectal cancer, or Stomach cancer.  ROS:   Please see the history of present illness.     All other systems reviewed and are negative.  EKGs/Labs/Other Studies Reviewed:    The following studies were reviewed today:  CT scan of chest 02/11/2019: 1. The tubular ascending aorta measures 3.8 x 3.7 cm, unchanged on examinations dating back to 06/01/2017 when measured similarly. The aortic valve measures 2.5 cm and the sinuses of Valsalva measure 4.5 cm, likewise unchanged. The aortic arch and descending thoracic aorta are normal in caliber. No significant atherosclerosis.  2.  Coronary artery disease.  3.  Stable, benign small pulmonary nodules.   Event monitor 02/22/2018  The patient was monitored for 657:42 hours, of which 625:25 hours yielded usable tracings.  The predominant rhythm was sinus with an average rate of 76 bpm.  There were frequent PAC's and multiple brief  atrial runs.  Rare PVC's were observed.  There was no sustained arrhythmia or prolonged pause. Predominantly sinus rhythm with frequent PAC's and brief atrial runs.  Nuclear stress test 02/16/2018 No significant ischemia, EF 50; paroxysmal atrial fibrillation noted after administration of Lexiscan, but spontaneously resolved.  Chest CTA 02/09/2018 IMPRESSION: 1. No evidence of acute aortic or branch vessel dissection. 2. Stable to incrementally increased aneurysmal dilatation of the tubular portion of the ascending thoracic aorta with a maximal diameter of 4.2 cm. Recommend annual imaging followup by CTA or MRA. 3. Stable ectasia of the aortic root with a maximal diameter of 4.8 cm. 4. Coronary artery calcifications.  ABIs 08/11/2017 Normal  Chest CTA 06/01/2017 4.1 cm ascending thoracic aortic aneurysm Coronary artery calcifications noted  Echocardiogram 04/14/17 EF 55-60, normal wall  motion, grade 1 diastolic dysfunction, mild to moderate aortic root dilation (4 cm), moderately dilated ascending aorta (4.3 cm, aortic arch 3.3 cm), mild MR, normal RVSF  Chest CTA 12/07/2004 Vessels are well opacified and there is no evidence for a thoracic dissection or aneurysm. The pulmonary arteries are also well opacified and there is no evidence for pulmonary embolus. There is no mediastinal, hilar or axillary adenopathy. Lung windows show no nodules, pleural effusions or infiltrates. There is minimal bibasilar atelectasis but no evidence for interstitial fibrosis. The findings on the recent chest x-ray may be related to hypoinflation. Images of the upper abdomen are unremarkable.  IMPRESSION:  No evidence for acute abnormality of the chest.  EKG:  EKG is not ordered today.    Recent Labs: 04/16/2019: BUN 16; Hemoglobin 15.0; Platelets 247.0; Potassium 4.8; Sodium 139; TSH 1.80 05/16/2019: Creatinine, Ser 1.40 07/04/2019: ALT 22  Recent Lipid Panel    Component Value Date/Time    CHOL 163 04/16/2019 1138   CHOL 150 04/13/2018 0827   TRIG 128.0 04/16/2019 1138   HDL 42.20 04/16/2019 1138   HDL 42 04/13/2018 0827   CHOLHDL 4 04/16/2019 1138   VLDL 25.6 04/16/2019 1138   LDLCALC 96 04/16/2019 1138   LDLCALC 83 04/13/2018 0827   LDLDIRECT 172.4 12/13/2012 0808    Physical Exam:    VS:  BP 126/80   Pulse (!) 51   Ht 5\' 9"  (1.753 m)   Wt 207 lb (93.9 kg)   SpO2 94%   BMI 30.57 kg/m     Wt Readings from Last 3 Encounters:  08/29/19 207 lb (93.9 kg)  07/04/19 209 lb 7 oz (95 kg)  06/21/19 211 lb (95.7 kg)     GEN:  Well nourished, well developed in no acute distress HEENT: Normal NECK: No JVD; No carotid bruits LYMPHATICS: No lymphadenopathy CARDIAC: RRR, no murmurs, rubs, gallops RESPIRATORY:  Clear to auscultation without rales, wheezing or rhonchi  ABDOMEN: Soft, non-tender, non-distended MUSCULOSKELETAL:  No edema; No deformity  SKIN: Warm and dry NEUROLOGIC:  Alert and oriented x 3 PSYCHIATRIC:  Normal affect   ASSESSMENT:    1. Premature atrial beats   2. Coronary artery calcification seen on CT scan   3. Thoracic ascending aortic aneurysm ([place])   4. Hyperlipidemia LDL goal <70   5. Dilated aortic root (HCC)    PLAN:    In order of problems listed above:  Premature atrial beats -Doing better on beta-blocker.  No changes.  GERD -PCP Dr. Damita Dunnings treating with omeprazole.  Hemoglobin 15 on 04/16/2019.  Creatinine 1.4 on 05/16/2019.  Coronary artery calcification on CT -No anginal symptoms, continuing with statin therapy, atorvastatin 20.  Because of acid reflux, we are not utilizing aspirin.  Hyperlipidemia -LDL 96 from outside labs on 04/16/2019.  Thoracic ascending aortic aneurysm -44 mm sinus Valsalva, 3.7 ascending aorta. -Understands no heavy lifting.  Continue to treat blood pressure well. -We will check an echocardiogram in 6 months, continue imaging once a year at least.  Scott in 6 months, me in 1 year  follow-up   Medication Adjustments/Labs and Tests Ordered: Current medicines are reviewed at length with the patient today.  Concerns regarding medicines are outlined above.  Orders Placed This Encounter  Procedures  . ECHOCARDIOGRAM COMPLETE   No orders of the defined types were placed in this encounter.   Patient Instructions  Medication Instructions:   Your physician recommends that you continue on your current medications as directed. Please refer to  the Current Medication list given to you today.  *If you need a refill on your cardiac medications before your next appointment, please call your pharmacy*   Testing/Procedures:  Your physician has requested that you have an echocardiogram. Echocardiography is a painless test that uses sound waves to create images of your heart. It provides your doctor with information about the size and shape of your heart and how well your heart's chambers and valves are working. This procedure takes approximately one hour. There are no restrictions for this procedure.  PER DR. Marlou Porch HE WOULD LIKE HIS ECHO SCHEDULED FOR 6 MONTHS OUT, PRIOR TO SEE SCOTT WEAVER PA-C IN 6 MONTHS     Follow-Up:  6 MONTHS WITH SCOTT WEAVER PA-C WITH AN ECHO PRIOR TO THIS VISIT.   At [facility], you and your health needs are our priority.  As part of our continuing mission to provide you with exceptional heart care, we have created designated Provider Care Teams.  These Care Teams include your primary Cardiologist (physician) and Advanced Practice Providers (APPs -  Physician Assistants and Nurse Practitioners) who all work together to provide you with the care you need, when you need it.  Your next appointment:   12 month(s)  The format for your next appointment:   In Person  Provider:   Candee Furbish, MD       Signed, Candee Furbish, MD  08/29/2019 10:36 AM    [place]

## 2019-09-19 ENCOUNTER — Other Ambulatory Visit: Payer: Self-pay | Admitting: Family Medicine

## 2019-09-19 ENCOUNTER — Other Ambulatory Visit: Payer: Self-pay | Admitting: Physician Assistant

## 2019-09-30 ENCOUNTER — Ambulatory Visit: Payer: Medicare Other | Admitting: Neurology

## 2019-10-03 ENCOUNTER — Ambulatory Visit: Payer: Medicare Other | Admitting: Family Medicine

## 2019-10-04 ENCOUNTER — Ambulatory Visit (INDEPENDENT_AMBULATORY_CARE_PROVIDER_SITE_OTHER): Payer: Medicare Other | Admitting: Neurology

## 2019-10-04 ENCOUNTER — Encounter: Payer: Self-pay | Admitting: Neurology

## 2019-10-04 ENCOUNTER — Other Ambulatory Visit: Payer: Self-pay

## 2019-10-04 VITALS — BP 147/76 | HR 57 | Resp 16 | Ht 69.0 in | Wt 212.6 lb

## 2019-10-04 DIAGNOSIS — G5732 Lesion of lateral popliteal nerve, left lower limb: Secondary | ICD-10-CM | POA: Diagnosis not present

## 2019-10-04 NOTE — Progress Notes (Signed)
Follow-up Visit   Date: 10/04/19   Howard [name] MRN: [id number] DOB: [birth year]   Interim History: Howard [name] is a 69 y.o. left-handed Caucasian male with prostate cancer s/p prostatectomy, hyperlipidemia, TAA, GERD, and OSA returning to the clinic for follow-up of left peroneal mononeuropathy.  The patient was accompanied to the clinic by self.  Since his last evaluation here, NCS/EMG confirmed the presence of a subacute common peroneal neuropathy.  His left foot weakness has resolved.  He has infrequent spells of numbness/tingling occurring about twice per week involving the left 2nd and 3rd toe, which usually resolves with moving his toes.  No falls or new weakness.   Medications:  Current Outpatient Medications on File Prior to Visit  Medication Sig Dispense Refill  . aspirin EC 81 MG tablet Take 1 tablet (81 mg total) by mouth daily.    Marland Kitchen atorvastatin (LIPITOR) 20 MG tablet TAKE 1 TABLET BY MOUTH EVERY DAY 90 tablet 1  . cholecalciferol (VITAMIN D) 1000 units tablet Take 1 tablet (1,000 Units total) by mouth daily.    . metoprolol succinate (TOPROL-XL) 25 MG 24 hr tablet TAKE 1 TABLET BY MOUTH EVERY DAY 30 tablet 11  . nitroGLYCERIN (NITROSTAT) 0.4 MG SL tablet Place 1 tablet (0.4 mg total) under the tongue every 5 (five) minutes as needed for chest pain. 25 tablet 6  . Omeprazole-Sodium Bicarbonate (ZEGERID OTC) 20-1100 MG CAPS Take 1 capsule by mouth daily before breakfast.     No current facility-administered medications on file prior to visit.    Allergies:  Allergies  Allergen Reactions  . Ibuprofen     "Eats a hole in the lining of his stomach"    Vital Signs:  BP (!) 147/76 Comment: did not take bp medication this M  Pulse (!) 57   Resp 16   Ht 5\' 9"  (1.753 m)   Wt 212 lb 9.6 oz (96.4 kg)   SpO2 97%   BMI 31.40 kg/m   Neurological Exam: MENTAL STATUS including orientation to time, place, person, recent and remote memory, attention span and  concentration, language, and fund of knowledge is normal.  Speech is not dysarthric.  MOTOR:  Motor strength is 5/5 in all extremities, including left foot dosiflexion, eversion, inversion, and toe extension.  No atrophy, fasciculations or abnormal movements.  No pronator drift.  Tone is normal.    MSRs:  Reflexes are 2+/4 throughout 3+/4 bilateral knees.  SENSORY:  Intact to vibration and temperature throughout.  COORDINATION/GAIT:   Gait narrow based and stable. Stressed and tandem gait intact  Data: NCS/EMG of the legs 06/26/2018: 1. 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   2. 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 Multilevel lumbar disc and facet degeneration, most notable at L3-4 where there is moderate left neural foraminal stenosis and potential L3 nerve root impingement due to a disc protrusion. 2. Mild lateral recess and left neural foraminal stenosis at L4-5.  IMPRESSION/PLAN: 1.  Left foot drop due to peroneal mononeuropathy, resolved.  Findings of EMG reviewed, exam markedly improved. Continue home exercises and avoid crossing the legs.  2.  Left frontal meningioma, stable and asymptomatic.   Return to clinic as needed  Thank you for allowing me to participate in patient's care.  If I can answer any additional questions, I would be pleased to do so.  Sincerely,    Alynah Schone K. Posey Pronto, DO

## 2019-10-08 ENCOUNTER — Other Ambulatory Visit: Payer: Self-pay | Admitting: Cardiology

## 2019-10-08 ENCOUNTER — Other Ambulatory Visit: Payer: Self-pay

## 2019-10-08 MED ORDER — METOPROLOL SUCCINATE ER 25 MG PO TB24: 25.0000 mg | ORAL_TABLET | Freq: Every day | ORAL | 11 refills | Status: DC

## 2019-10-08 MED ORDER — METOPROLOL SUCCINATE ER 25 MG PO TB24: 25.0000 mg | ORAL_TABLET | Freq: Every day | ORAL | 3 refills | Status: DC

## 2019-10-08 NOTE — Telephone Encounter (Signed)
*  STAT* If patient is at the pharmacy, call can be transferred to refill team.   1. Which medications need to be refilled? (please list name of each medication and dose if known) Metoprolol   2. Which pharmacy/location (including street and city if local pharmacy) is [facility] to [phone]  3. Do they need a 30 day or 90 day supply? 90 patient has has a apt in 01/21

## 2019-10-08 NOTE — Telephone Encounter (Signed)
Returned call to pt and left a detailed message that his refill for [facility]oprol[facility] [place], per pt request.

## 2019-10-17 ENCOUNTER — Other Ambulatory Visit: Payer: Medicare Other

## 2019-11-07 ENCOUNTER — Other Ambulatory Visit: Payer: Self-pay

## 2019-11-07 ENCOUNTER — Other Ambulatory Visit (INDEPENDENT_AMBULATORY_CARE_PROVIDER_SITE_OTHER): Payer: Medicare Other

## 2019-11-07 DIAGNOSIS — R748 Abnormal levels of other serum enzymes: Secondary | ICD-10-CM | POA: Diagnosis not present

## 2019-11-07 LAB — HEPATIC FUNCTION PANEL
ALT: 26 U/L (ref 0–53)
AST: 29 U/L (ref 0–37)
Albumin: 4 g/dL (ref 3.5–5.2)
Alkaline Phosphatase: 129 U/L — ABNORMAL HIGH (ref 39–117)
Bilirubin, Direct: 0.2 mg/dL (ref 0.0–0.3)
Total Bilirubin: 0.7 mg/dL (ref 0.2–1.2)
Total Protein: 7.2 g/dL (ref 6.0–8.3)

## 2019-11-10 ENCOUNTER — Other Ambulatory Visit: Payer: Self-pay | Admitting: Family Medicine

## 2019-11-10 DIAGNOSIS — R748 Abnormal levels of other serum enzymes: Secondary | ICD-10-CM

## 2019-12-06 ENCOUNTER — Telehealth: Payer: Self-pay

## 2019-12-06 NOTE — Telephone Encounter (Signed)
I spoke with patient. Discussed below.  Wife's first day of symptoms was 4/20 - to end quarantine Friday 4/30. I advised husband to quarantine at home for 2 wks from today given he can separate from wife until she ends her quarantine.

## 2019-12-06 NOTE — Telephone Encounter (Signed)
Pt said his wife has been feeling cold; had fever 100 earlier today and joints hurting. Pt's wife tested positive with rapid test for covid 12/06/19 at [address]pts wife has to quarantine until 12/13/19. Pt has no covid symptom; pt had moderna covid vaccine on 09/28/19 and 10/26/19 at [facility] on [address]. Pt wants to know if he can go to work on 12/07/19. Pt request cb today.

## 2019-12-06 NOTE — Telephone Encounter (Signed)
Probably still would recommend he not go to work - but rather follow 2 wk quarantine guidelines as set forth by [employer].  Would also recommend isolation from wife throughout her quarantine period of 10 days from symptom onset (ie use separate parts of house, separate bathrooms etc) and if able to do this for 10 days, then he can start his quarantine from today.

## 2019-12-09 NOTE — Telephone Encounter (Signed)
Noted  

## 2019-12-09 NOTE — Telephone Encounter (Signed)
Late entry -  I spoke with patient on 12/08/2019 to review latest CDC guidelines which actually state given he's fully vaccinated he no longer needs to self quarantine after exposure to +COVID. He therefore is clear to return to work at this time. To let us know if symptoms develop in the interim.

## 2020-01-01 ENCOUNTER — Other Ambulatory Visit: Payer: Self-pay

## 2020-01-01 ENCOUNTER — Ambulatory Visit (INDEPENDENT_AMBULATORY_CARE_PROVIDER_SITE_OTHER): Payer: Medicare Other | Admitting: Family Medicine

## 2020-01-01 ENCOUNTER — Encounter: Payer: Self-pay | Admitting: Family Medicine

## 2020-01-01 VITALS — BP 128/76 | HR 64 | Temp 98.5°F | Ht 69.0 in | Wt 213.0 lb

## 2020-01-01 DIAGNOSIS — W57XXXA Bitten or stung by nonvenomous insect and other nonvenomous arthropods, initial encounter: Secondary | ICD-10-CM

## 2020-01-01 DIAGNOSIS — S70261A Insect bite (nonvenomous), right hip, initial encounter: Secondary | ICD-10-CM

## 2020-01-01 NOTE — Progress Notes (Signed)
   Subjective:     Howard [name] is a 69 y.o. male presenting for Insect Bite (right hip area. noticed it on 12/27/19)     HPI  #Insect Bite - grandson was bit recently and put on medication - was out working in grass and trees on Thursday and noticed the sore on Friday - no tick removal  - area was initially itchy - no issues now - no pain   Review of Systems  Constitutional: Negative for chills and fever.  Respiratory: Negative for shortness of breath.   Cardiovascular: Negative for chest pain.  Musculoskeletal: Negative for arthralgias and myalgias.  Neurological: Negative for headaches.     Social History   Tobacco Use  Smoking Status Never Smoker  Smokeless Tobacco Never Used        Objective:    BP Readings from Last 3 Encounters:  01/01/20 128/76  10/04/19 (!) 147/76  08/29/19 126/80   Wt Readings from Last 3 Encounters:  01/01/20 213 lb (96.6 kg)  10/04/19 212 lb 9.6 oz (96.4 kg)  08/29/19 207 lb (93.9 kg)    BP 128/76   Pulse 64   Temp 98.5 F (36.9 C)   Ht 5\' 9"  (1.753 m)   Wt 213 lb (96.6 kg)   SpO2 96%   BMI 31.45 kg/m    Physical Exam Constitutional:      Appearance: Normal appearance. He is not ill-appearing or diaphoretic.  HENT:     Right Ear: External ear normal.     Left Ear: External ear normal.  Eyes:     General: No scleral icterus.    Extraocular Movements: Extraocular movements intact.     Conjunctiva/sclera: Conjunctivae normal.  Cardiovascular:     Rate and Rhythm: Normal rate.  Pulmonary:     Effort: Pulmonary effort is normal.  Musculoskeletal:     Cervical back: Neck supple.  Skin:    General: Skin is warm and dry.     Comments: Right hip with small erythematous papule with central healing ulcer.   Neurological:     Mental Status: He is alert. Mental status is at baseline.  Psychiatric:        Mood and Affect: Mood normal.           Assessment & Plan:   Problem List Items Addressed This Visit     None    Visit Diagnoses    Insect bite of right hip, initial encounter    -  Primary     No clear hx of tick bite or removal and no systemic symptoms. Suspect mosquito bite which is healing and no longer itchy. Watch and wait.    Return if symptoms worsen or fail to improve.  Lesleigh Noe, MD

## 2020-01-01 NOTE — Patient Instructions (Addendum)
Insect Bite - if you develop fever/chils, new rash, muscle aches/pain, fatigue, or headaches -- call back   - Can try benadryl cream if it is [employer]

## 2020-02-11 ENCOUNTER — Other Ambulatory Visit: Payer: Self-pay | Admitting: *Deleted

## 2020-02-11 DIAGNOSIS — Z01812 Encounter for preprocedural laboratory examination: Secondary | ICD-10-CM

## 2020-02-11 DIAGNOSIS — I77810 Thoracic aortic ectasia: Secondary | ICD-10-CM

## 2020-02-11 NOTE — Progress Notes (Signed)
  I am getting ready to schedule Howard [name] for a 1 year follow up CT Angio Aorta. He needs an order for BUN CRE   Thanks  Lattie Haw    Order placed as requested.  CT Angio Aorta was ordered as f/u per Richardson Dopp, PA.

## 2020-02-13 ENCOUNTER — Other Ambulatory Visit (INDEPENDENT_AMBULATORY_CARE_PROVIDER_SITE_OTHER): Payer: Medicare Other

## 2020-02-13 DIAGNOSIS — R748 Abnormal levels of other serum enzymes: Secondary | ICD-10-CM | POA: Diagnosis not present

## 2020-02-13 LAB — HEPATIC FUNCTION PANEL
ALT: 19 U/L (ref 0–53)
AST: 29 U/L (ref 0–37)
Albumin: 4 g/dL (ref 3.5–5.2)
Alkaline Phosphatase: 113 U/L (ref 39–117)
Bilirubin, Direct: 0.2 mg/dL (ref 0.0–0.3)
Total Bilirubin: 0.6 mg/dL (ref 0.2–1.2)
Total Protein: 7.1 g/dL (ref 6.0–8.3)

## 2020-02-20 ENCOUNTER — Ambulatory Visit (HOSPITAL_COMMUNITY): Payer: Medicare Other | Attending: Cardiology

## 2020-02-20 ENCOUNTER — Other Ambulatory Visit: Payer: Self-pay

## 2020-02-20 DIAGNOSIS — I77810 Thoracic aortic ectasia: Secondary | ICD-10-CM | POA: Insufficient documentation

## 2020-02-20 DIAGNOSIS — I712 Thoracic aortic aneurysm, without rupture: Secondary | ICD-10-CM | POA: Insufficient documentation

## 2020-02-20 DIAGNOSIS — I7121 Aneurysm of the ascending aorta, without rupture: Secondary | ICD-10-CM

## 2020-02-26 ENCOUNTER — Telehealth: Payer: Self-pay | Admitting: Cardiology

## 2020-02-26 NOTE — Telephone Encounter (Signed)
Stable aortic root diameter. Mildly dilated.  Howard Furbish, MD

## 2020-02-26 NOTE — Telephone Encounter (Signed)
Reviewed echo results with pt who states understanding.

## 2020-02-26 NOTE — Telephone Encounter (Signed)
New Message  Patient is on the line returning the call for his echo results. Please give patient a call back to discuss.

## 2020-03-02 ENCOUNTER — Other Ambulatory Visit: Payer: Self-pay

## 2020-03-02 ENCOUNTER — Other Ambulatory Visit: Payer: Medicare Other | Admitting: *Deleted

## 2020-03-02 DIAGNOSIS — Z01812 Encounter for preprocedural laboratory examination: Secondary | ICD-10-CM

## 2020-03-02 DIAGNOSIS — I77810 Thoracic aortic ectasia: Secondary | ICD-10-CM

## 2020-03-03 LAB — BASIC METABOLIC PANEL
BUN/Creatinine Ratio: 13 (ref 10–24)
BUN: 16 mg/dL (ref 8–27)
CO2: 25 mmol/L (ref 20–29)
Calcium: 9.4 mg/dL (ref 8.6–10.2)
Chloride: 103 mmol/L (ref 96–106)
Creatinine, Ser: 1.24 mg/dL (ref 0.76–1.27)
GFR calc Af Amer: 68 mL/min/{1.73_m2} (ref >59)
GFR calc non Af Amer: 59 mL/min/{1.73_m2} — ABNORMAL LOW (ref >59)
Glucose: 99 mg/dL (ref 65–99)
Potassium: 4.4 mmol/L (ref 3.5–5.2)
Sodium: 140 mmol/L (ref 134–144)

## 2020-03-05 ENCOUNTER — Encounter: Payer: Self-pay | Admitting: Family Medicine

## 2020-03-05 ENCOUNTER — Other Ambulatory Visit: Payer: Self-pay

## 2020-03-05 ENCOUNTER — Ambulatory Visit (INDEPENDENT_AMBULATORY_CARE_PROVIDER_SITE_OTHER): Payer: Medicare Other | Admitting: Family Medicine

## 2020-03-05 DIAGNOSIS — W57XXXA Bitten or stung by nonvenomous insect and other nonvenomous arthropods, initial encounter: Secondary | ICD-10-CM

## 2020-03-05 DIAGNOSIS — S70362A Insect bite (nonvenomous), left thigh, initial encounter: Secondary | ICD-10-CM

## 2020-03-05 DIAGNOSIS — M25569 Pain in unspecified knee: Secondary | ICD-10-CM

## 2020-03-05 NOTE — Progress Notes (Signed)
This visit occurred during the SARS-CoV-2 public health emergency.  Safety protocols were in place, including screening questions prior to the visit, additional usage of staff PPE, and extensive cleaning of exam room while observing appropriate contact time as indicated for disinfecting solutions.  No NTG use.  Med list reviewed.    Tick bite 02/29/20, attached at that point.  Removed at that point with tweezers.  Cleaned the area.  The next AM it was locally irritated.  It was irritated for a few days but is getting better.    No FCNAVD.  He has been walking 2 miles a day recently, on days with good weather.  Now with L knee pain.  L knee puffy but not bruised or red.  Didn't fall.  No locking or grinding.  H/o meniscus injury years ago.  He wears nikes that are in good shape when walking.  R knee is okay.  Medial L knee pain.    Meds, vitals, and allergies reviewed.   ROS: Per HPI unless specifically indicated in ROS section   nad ncat rrr ctab Left thigh with 1.5 x 1 cm tick bite site with minimal peripheral pinkish changes but this is already improving per patient report.  No streaking spreading erythema.  No discharge.  No fluctuant mass. Left knee ttp medial joint line and mild patellar crepitus.  Laterally not tender to palpation.  No erythema.  Normal range of motion.  No meniscal click.

## 2020-03-05 NOTE — Patient Instructions (Addendum)
Take tylenol if needed for knee pain and ice before and after walking. Ice for 5 minutes at a time.   Take care.  Glad to see you.

## 2020-03-06 ENCOUNTER — Ambulatory Visit (INDEPENDENT_AMBULATORY_CARE_PROVIDER_SITE_OTHER)
Admission: RE | Admit: 2020-03-06 | Discharge: 2020-03-06 | Disposition: A | Discharge status: Home / Self Care | Payer: Medicare Other | Source: Ambulatory Visit | Attending: Physician Assistant | Admitting: Physician Assistant

## 2020-03-06 DIAGNOSIS — Z9189 Other specified personal risk factors, not elsewhere classified: Secondary | ICD-10-CM

## 2020-03-06 IMAGING — CT CT ANGIO CHEST
3 of 8 series · 17 of 46 positions shown · IV contrast (OMNIPAQUE 350)
Comparison: [DATE]

CLINICAL DATA: Thoracic aortic aneurysm

EXAM:
CT ANGIOGRAPHY CHEST WITH CONTRAST
TECHNIQUE: Multidetector CT imaging of the chest was performed using the
standard protocol during bolus administration of intravenous
contrast. Multiplanar CT image reconstructions and MIPs were
obtained to evaluate the vascular anatomy.
CONTRAST:  100mL OMNIPAQUE IOHEXOL 300 MG/ML  SOLN

[Series 4: aorta 3.0 bf37 2 · axial · 0.77mm/px · z∈[-318,-70]mm · 12 of 99 slices shown]
[im 8/99  lung]
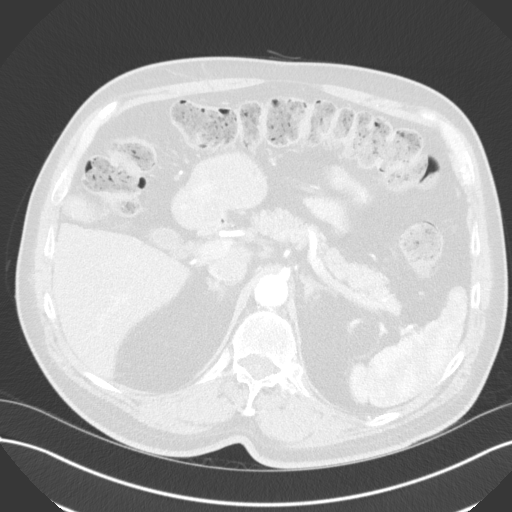
[im 16/99  soft-tissue]
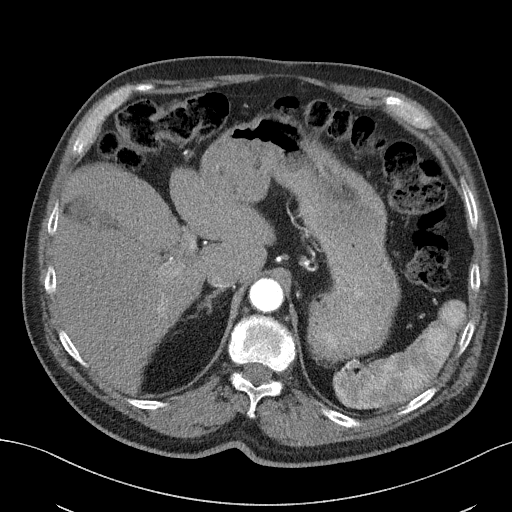
[im 23/99  lung]
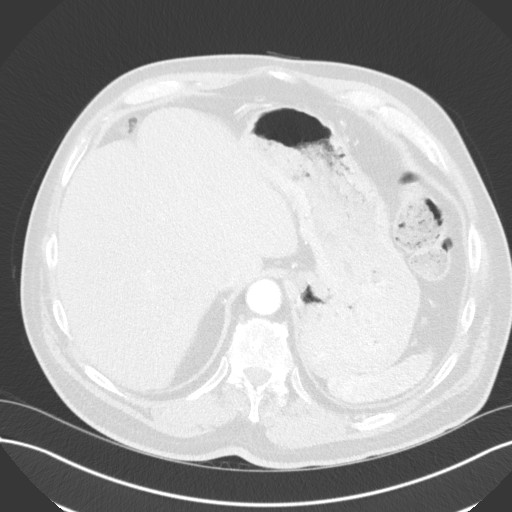
[im 31/99  soft-tissue]
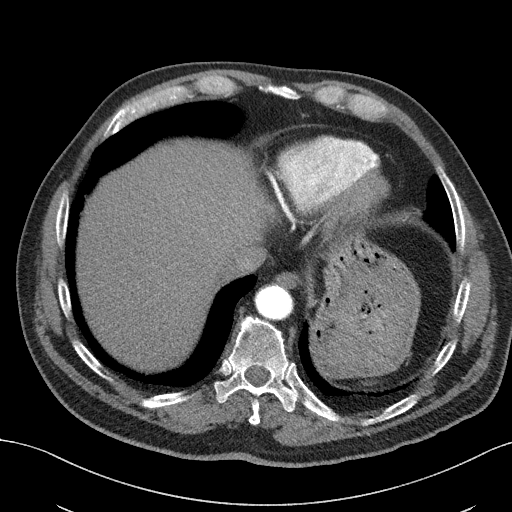
[im 38/99  lung]
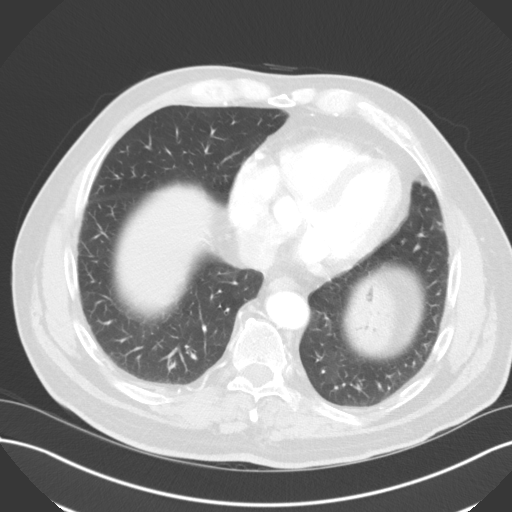
[im 46/99  soft-tissue]
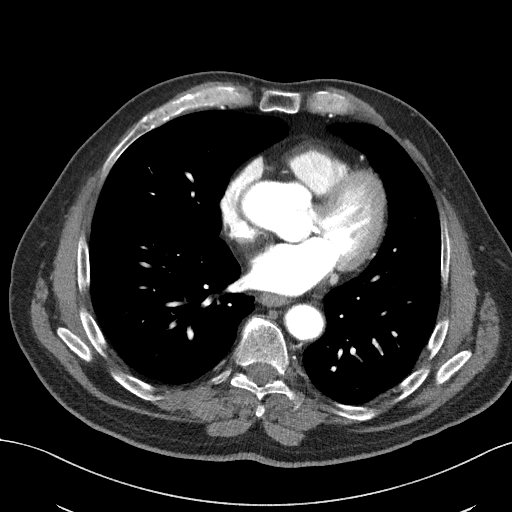
[im 53/99  lung]
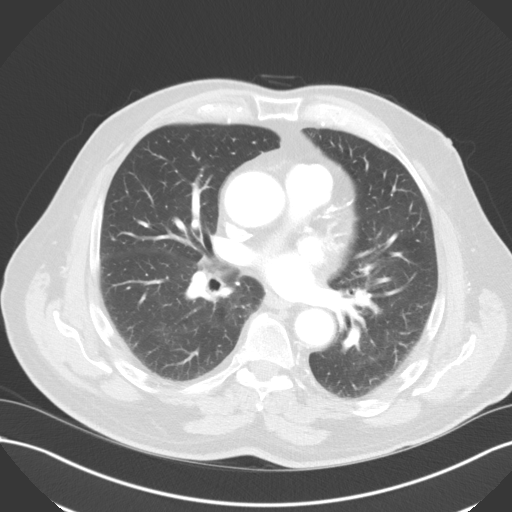
[im 61/99  soft-tissue]
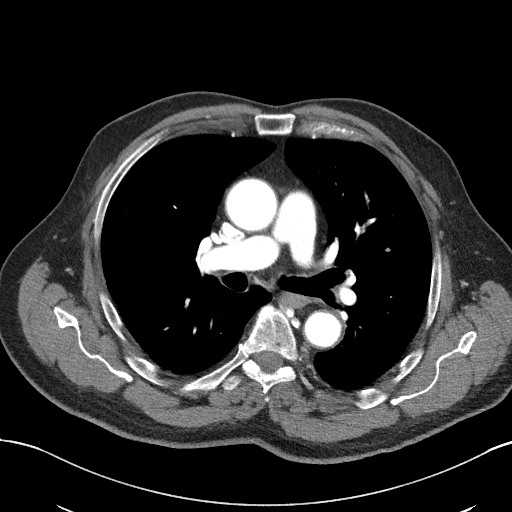
[im 68/99  lung]
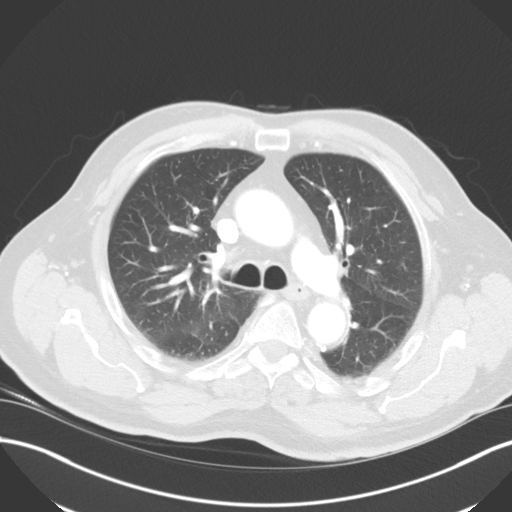
[im 76/99  soft-tissue]
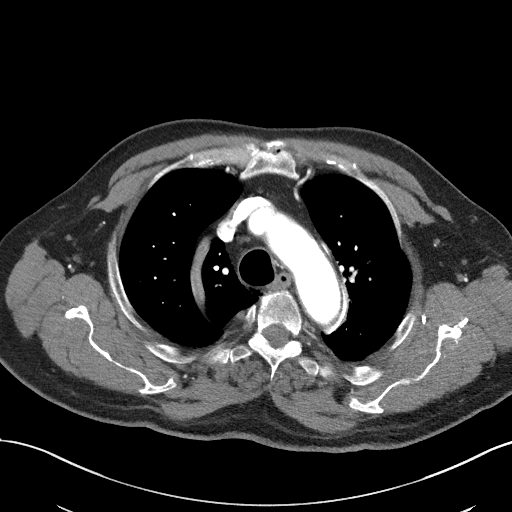
[im 83/99  lung]
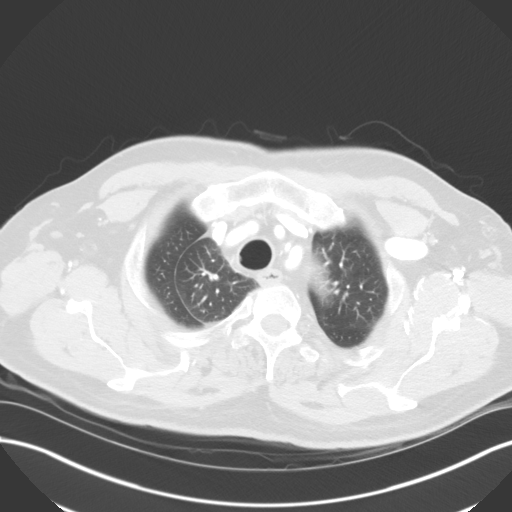
[im 91/99  soft-tissue]
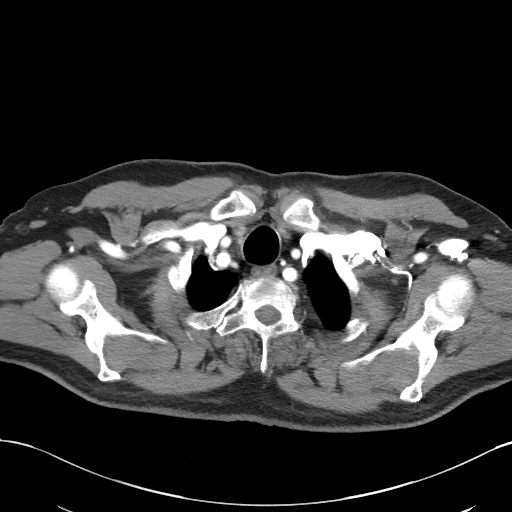

[Series 5: lung · axial · 0.77mm/px · z∈[-318,-276]mm · 2 of 99 slices shown]
[im 8/99  soft-tissue]
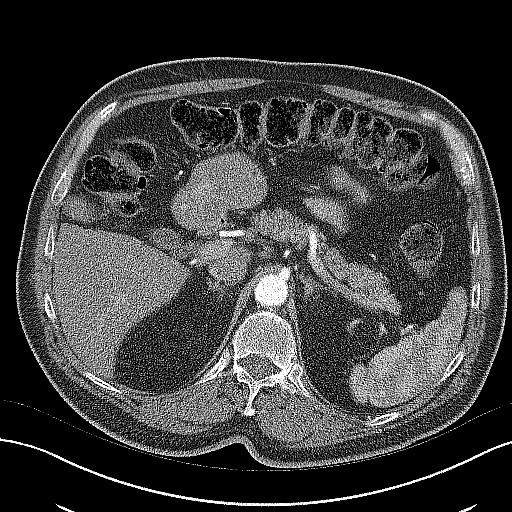
[im 22/99  soft-tissue]
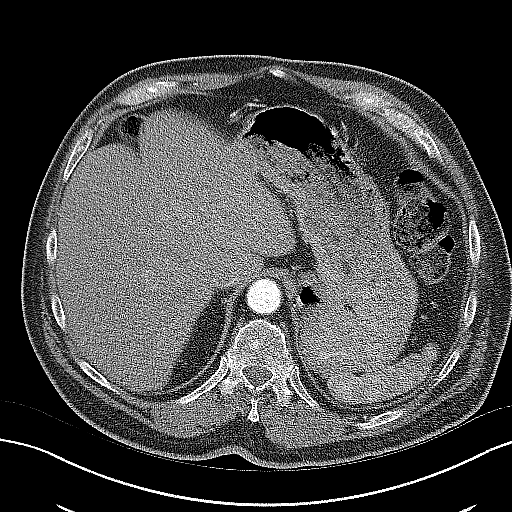

[Series 7: coronals · coronal · 0.59mm/px · 3 of 139 slices shown]
[im 35/139  soft-tissue]
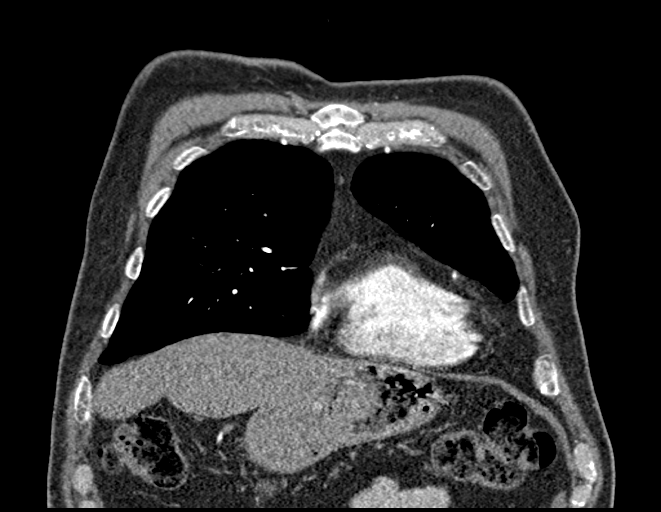
[im 70/139  soft-tissue]
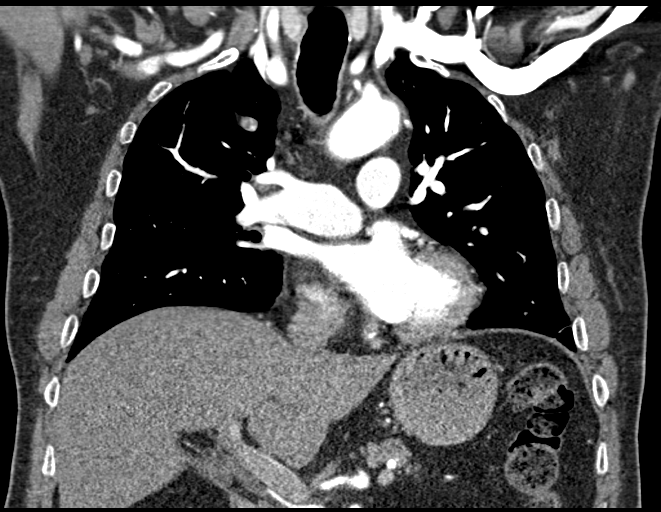
[im 104/139  soft-tissue]
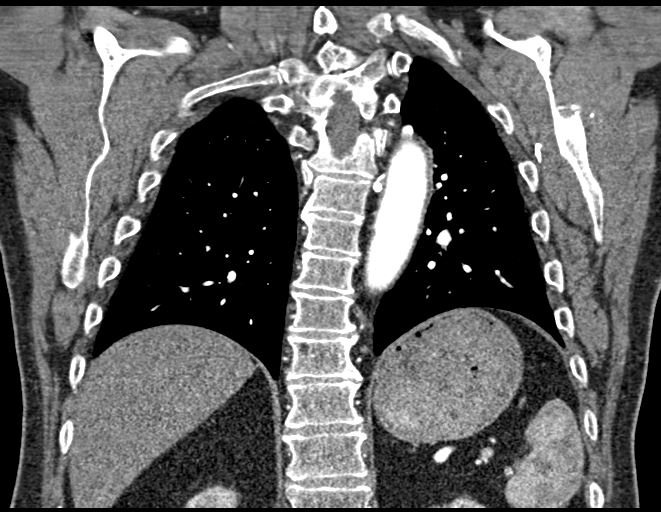

[17 of 46 positions shown; findings below may reference images not displayed]

FINDINGS: Cardiovascular: Remeasuring at similar levels, the ascending
thoracic aorta has a maximal diameter of 4.2 cm, unchanged. Minor
atherosclerotic changes noted. Patent 4 vessel arch anatomy. Left
vertebral artery originates directly off the aorta, normal variant.
Proximal descending thoracic aorta measures 2.8 cm.

Sinotubular junction measures 3.3 cm. Sinus of Valsalva measures
cm.

Visualized central pulmonary arteries are patent. No significant
filling defect or pulmonary embolus demonstrated. Normal heart size.
Native coronary atherosclerosis noted. No pericardial effusion. No
mediastinal hemorrhage or hematoma. Negative for dissection.

Mediastinum/Nodes: No enlarged mediastinal, hilar, or axillary lymph
nodes. Thyroid gland, trachea, and esophagus demonstrate no
significant findings.

Lungs/Pleura: Azygos fissure in the right upper lobe, normal
variant. No focal pneumonia, collapse or consolidation. Minor
basilar atelectasis/scarring. No pleural abnormality, effusion or
pneumothorax. No interstitial process or edema.

Upper Abdomen: Cholelithiasis evident. Gallbladder nondistended. No
acute inflammatory process. No biliary dilatation.

Musculoskeletal: Degenerative changes of the spine. Minor scoliosis.
No acute osseous finding. Intact sternum.

Review of the MIP images confirms the above findings.
IMPRESSION: Stable minor aneurysmal dilatation of the ascending thoracic aorta,
maximal diameter 4.2 cm.

Recommend annual imaging followup by CTA or MRA. This recommendation
follows [GK] ACCF/AHA/AATS/ACR/ASA/SCA/JIM/JIM/JIM/JIM Guidelines
for the Diagnosis and Management of Patients with Thoracic Aortic
Disease. Circulation. [GK]; 121: E266-e369. Aortic aneurysm NOS
([GK]-[GK])

No other acute intrathoracic finding.

Cholelithiasis

Aortic Atherosclerosis ([GK]-[GK]).

## 2020-03-06 MED ORDER — IOHEXOL 300 MG/ML  SOLN
100.0000 mL | Freq: Once | INTRAMUSCULAR | Status: AC | PRN
  Administered 2020-03-06: 100 mL via INTRAVENOUS

## 2020-03-08 DIAGNOSIS — W57XXXA Bitten or stung by nonvenomous insect and other nonvenomous arthropods, initial encounter: Secondary | ICD-10-CM | POA: Insufficient documentation

## 2020-03-08 NOTE — Assessment & Plan Note (Signed)
Discussed options.  No need for imaging at this point Take tylenol if needed for knee pain and ice before and after walking. Ice for 5 minutes at a time.  He will update me as needed.

## 2020-03-08 NOTE — Assessment & Plan Note (Signed)
Should resolve on its own.  Discussed options.  No need for intervention at this point but I did give him alpha gal cautions just in case he had trouble in the future.

## 2020-03-09 ENCOUNTER — Encounter: Payer: Self-pay | Admitting: Physician Assistant

## 2020-03-09 DIAGNOSIS — I700 Atherosclerosis of aorta: Secondary | ICD-10-CM | POA: Insufficient documentation

## 2020-03-11 ENCOUNTER — Telehealth: Payer: Self-pay | Admitting: Physician Assistant

## 2020-03-11 NOTE — Telephone Encounter (Signed)
Patient called back returning Emily's Call about his CT results

## 2020-03-12 NOTE — Telephone Encounter (Signed)
Follow Up ° °Patient is returning call. Please give patient a call back.  °

## 2020-03-12 NOTE — Telephone Encounter (Signed)
The patient has been notified of the result and verbalized understanding.  All questions (if any) were answered. Mady Haagensen, Victor 03/12/2020 4:10 PM

## 2020-03-13 ENCOUNTER — Encounter: Payer: Self-pay | Admitting: Family Medicine

## 2020-03-13 DIAGNOSIS — K8020 Calculus of gallbladder without cholecystitis without obstruction: Secondary | ICD-10-CM | POA: Insufficient documentation

## 2020-03-16 ENCOUNTER — Telehealth: Payer: Self-pay

## 2020-03-16 NOTE — Telephone Encounter (Signed)
Patient advised.

## 2020-03-16 NOTE — Telephone Encounter (Signed)
If he is passing gas, then that is a good sign.  He can continue with miralax and add on one dose of OTC magnesium citrate.  If not getting better or if abd pain/fever, then needs to get rechecked.  Thanks.

## 2020-03-16 NOTE — Telephone Encounter (Signed)
Pt is having issue with constipation started 1.5 weeks ago. He has tried Miralax, MOM, and stool softeners. He has had gas, but no movements. He has had the same issue in the past and usually these medications help. Asking what else he can do. Please advise. [facility].

## 2020-05-11 ENCOUNTER — Other Ambulatory Visit: Payer: Self-pay

## 2020-05-11 ENCOUNTER — Ambulatory Visit (INDEPENDENT_AMBULATORY_CARE_PROVIDER_SITE_OTHER): Payer: Medicare Other | Admitting: Family Medicine

## 2020-05-11 ENCOUNTER — Encounter: Payer: Self-pay | Admitting: Family Medicine

## 2020-05-11 VITALS — BP 140/70 | HR 56 | Temp 98.2°F | Ht 69.0 in | Wt 212.2 lb

## 2020-05-11 DIAGNOSIS — Z23 Encounter for immunization: Secondary | ICD-10-CM | POA: Diagnosis not present

## 2020-05-11 DIAGNOSIS — M7741 Metatarsalgia, right foot: Secondary | ICD-10-CM | POA: Diagnosis not present

## 2020-05-11 NOTE — Progress Notes (Signed)
Howard [name] T. Dagan Heinz, MD, [place] at [facility] [place], [address]  Phone: [phone]  FAX: [phone]  Howard [name] - 69 y.o. male  MRN [id number]  Date of Birth: 02/15/1951  Date: 05/11/2020  PCP: Tonia Ghent, MD  Referral: Tonia Ghent, MD  Chief Complaint  Patient presents with  . Foot Pain    Right-last 3 toes    This visit occurred during the SARS-CoV-2 public health emergency.  Safety protocols were in place, including screening questions prior to the visit, additional usage of staff PPE, and extensive cleaning of exam room while observing appropriate contact time as indicated for disinfecting solutions.   Subjective:   Howard [name] is a 69 y.o. very pleasant male patient with Body mass index is 31.34 kg/m. who presents with the following:  R toe pain:  R foot pain.  He has pain in the forefoot in metatarsals as well as toes 3 through 5.  He does not have the injury or trauma.  He does not have any significant swelling.  This is been ongoing for a relatively short amount of time in the order of a few months.  He does have some distal tingling at the third through fourth digits as well.  He does not have any numbness pain or tingling from the leg, back or into the lower extremity and this isolates to the foot itself.  He does not have any obvious swelling.  There is no redness, warmth or any other systemic symptoms.  2-4 pain.  Tried nothing at home.   Review of Systems is noted in the HPI, as appropriate   Objective:   BP 140/70   Pulse (!) 56   Temp 98.2 F (36.8 C) (Temporal)   Ht 5\' 9"  (1.753 m)   Wt 212 lb 4 oz (96.3 kg)   SpO2 97%   BMI 31.34 kg/m    GEN: No acute distress; alert,appropriate. PULM: Breathing comfortably in no respiratory distress PSYCH: Normally interactive.    The entirety of the tibia, fibula, hindfoot and  midfoot are entirely nontender.  All phalanges are nontender.  Does have some tenderness with flexion and extension at the fourth digit.  He does have some mild tenderness at the metatarsal heads from 3 through 4.  No significant tenderness in the interdigital web space.  Radiology: No results found.  Assessment and Plan:     ICD-10-CM   1. Metatarsalgia, right foot  M77.41   2. Need for influenza vaccination  Z23 Flu Vaccine QUAD High Dose(Fluad)   Basic metatarsalgia.  I would not do anything at this point other than basic care.  I did give him a metatarsal pad to try, and if this does not work I gave him some Poron to make a metatarsal bar.  Ultimately this is a bit challenging since he had not brought in his shoes or the orthotics that he wears daily.  No orders of the defined types were placed in this encounter.  There are no discontinued medications. Orders Placed This Encounter  Procedures  . Flu Vaccine QUAD High Dose(Fluad)    Follow-up: No follow-ups on file.  Signed,  Howard Deed. Romi Rathel, MD   Outpatient Encounter Medications as of 05/11/2020  Medication Sig  . aspirin EC 81 MG tablet Take 1 tablet (81 mg total) by mouth daily.  Marland Kitchen atorvastatin (LIPITOR) 20 MG tablet TAKE 1  TABLET BY MOUTH EVERY DAY  . cholecalciferol (VITAMIN D) 1000 units tablet Take 1 tablet (1,000 Units total) by mouth daily.  . metoprolol succinate (TOPROL-XL) 25 MG 24 hr tablet Take 1 tablet (25 mg total) by mouth daily.  Earney Navy Bicarbonate (ZEGERID OTC) 20-1100 MG CAPS Take 1 capsule by mouth daily before breakfast.   No facility-administered encounter medications on file as of 05/11/2020.

## 2020-05-27 ENCOUNTER — Other Ambulatory Visit: Payer: Self-pay | Admitting: Family Medicine

## 2020-05-27 NOTE — Telephone Encounter (Signed)
Please call patient and schedule annual physical. 

## 2020-05-29 ENCOUNTER — Ambulatory Visit: Payer: Medicare Other | Admitting: Family Medicine

## 2020-06-04 ENCOUNTER — Ambulatory Visit (INDEPENDENT_AMBULATORY_CARE_PROVIDER_SITE_OTHER): Payer: Medicare Other | Admitting: Family Medicine

## 2020-06-04 ENCOUNTER — Other Ambulatory Visit: Payer: Self-pay

## 2020-06-04 ENCOUNTER — Encounter: Payer: Self-pay | Admitting: Family Medicine

## 2020-06-04 DIAGNOSIS — M7740 Metatarsalgia, unspecified foot: Secondary | ICD-10-CM

## 2020-06-04 NOTE — Patient Instructions (Signed)
Try using the metatarsal pad and let me know if not better.  We can set you up with podiatry if needed.  Take care.  Glad to see you.

## 2020-06-04 NOTE — Progress Notes (Signed)
This visit occurred during the SARS-CoV-2 public health emergency.  Safety protocols were in place, including screening questions prior to the visit, additional usage of staff PPE, and extensive cleaning of exam room while observing appropriate contact time as indicated for disinfecting solutions.  He is still having dorsal R lateral midfoot pain.  He has some lateral numbness in the 3rd-5th toes.  No trauma.  He previously Dr. Edilia Bo.  Small metatarsal pad and a metatarsal bar did not provide relief.  Meds, vitals, and allergies reviewed.   ROS: Per HPI unless specifically indicated in ROS section   nad ncat Right foot with normal inspection. R 3rd-5th dorsally sensitive w/o ulceration.  No plantar side sx.  No ulceration or erythema.  No bruising.   I gave him a longitudinal metatarsal arch pad and he felt some better with that.  We applied it to his insert and he will use it with routine cautions in the meantime.

## 2020-06-07 NOTE — Assessment & Plan Note (Signed)
I think he is likely having mechanical symptoms that may improve with more extensive plantar padding. I gave him a longitudinal metatarsal arch pad and he felt some better with that.  We applied it to his insert and he will use it with routine cautions in the meantime.  If not better with this then we can refer to podiatry.  He will update me as needed.

## 2020-06-10 ENCOUNTER — Telehealth: Payer: Self-pay | Admitting: Family Medicine

## 2020-06-10 NOTE — Telephone Encounter (Signed)
Patient has scheduled cpe, mwv, and labs. EM

## 2020-06-10 NOTE — Telephone Encounter (Signed)
Scheduled cpe mwv and labs

## 2020-06-18 ENCOUNTER — Encounter: Payer: Medicare Other | Admitting: Family Medicine

## 2020-08-02 ENCOUNTER — Other Ambulatory Visit: Payer: Self-pay | Admitting: Family Medicine

## 2020-08-02 DIAGNOSIS — E559 Vitamin D deficiency, unspecified: Secondary | ICD-10-CM

## 2020-08-02 DIAGNOSIS — E785 Hyperlipidemia, unspecified: Secondary | ICD-10-CM

## 2020-08-05 ENCOUNTER — Ambulatory Visit: Payer: Medicare Other

## 2020-08-10 ENCOUNTER — Other Ambulatory Visit (INDEPENDENT_AMBULATORY_CARE_PROVIDER_SITE_OTHER): Payer: Medicare Other

## 2020-08-10 ENCOUNTER — Other Ambulatory Visit: Payer: Self-pay

## 2020-08-10 DIAGNOSIS — E785 Hyperlipidemia, unspecified: Secondary | ICD-10-CM | POA: Diagnosis not present

## 2020-08-10 DIAGNOSIS — E559 Vitamin D deficiency, unspecified: Secondary | ICD-10-CM | POA: Diagnosis not present

## 2020-08-10 LAB — COMPREHENSIVE METABOLIC PANEL
ALT: 20 U/L (ref 0–53)
AST: 30 U/L (ref 0–37)
Albumin: 3.8 g/dL (ref 3.5–5.2)
Alkaline Phosphatase: 101 U/L (ref 39–117)
BUN: 17 mg/dL (ref 6–23)
CO2: 29 mEq/L (ref 19–32)
Calcium: 9.2 mg/dL (ref 8.4–10.5)
Chloride: 104 mEq/L (ref 96–112)
Creatinine, Ser: 1.54 mg/dL — ABNORMAL HIGH (ref 0.40–1.50)
GFR: 45.39 mL/min — ABNORMAL LOW (ref >60.00)
Glucose, Bld: 64 mg/dL — ABNORMAL LOW (ref 70–99)
Potassium: 4.4 mEq/L (ref 3.5–5.1)
Sodium: 138 mEq/L (ref 135–145)
Total Bilirubin: 0.5 mg/dL (ref 0.2–1.2)
Total Protein: 7.1 g/dL (ref 6.0–8.3)

## 2020-08-10 LAB — LIPID PANEL
Cholesterol: 153 mg/dL (ref 0–200)
HDL: 39.1 mg/dL (ref >39.00)
LDL Cholesterol: 87 mg/dL (ref 0–99)
NonHDL: 113.67
Total CHOL/HDL Ratio: 4
Triglycerides: 131 mg/dL (ref 0.0–149.0)
VLDL: 26.2 mg/dL (ref 0.0–40.0)

## 2020-08-10 LAB — VITAMIN D 25 HYDROXY (VIT D DEFICIENCY, FRACTURES): VITD: 46.36 ng/mL (ref 30.00–100.00)

## 2020-08-13 ENCOUNTER — Encounter: Payer: Medicare Other | Admitting: Family Medicine

## 2020-08-20 ENCOUNTER — Encounter: Payer: Medicare Other | Admitting: Family Medicine

## 2020-08-22 ENCOUNTER — Other Ambulatory Visit: Payer: Self-pay | Admitting: Family Medicine

## 2020-09-03 ENCOUNTER — Encounter: Payer: Self-pay | Admitting: Family Medicine

## 2020-09-03 ENCOUNTER — Ambulatory Visit (INDEPENDENT_AMBULATORY_CARE_PROVIDER_SITE_OTHER): Payer: Medicare Other | Admitting: Family Medicine

## 2020-09-03 ENCOUNTER — Other Ambulatory Visit: Payer: Self-pay

## 2020-09-03 VITALS — BP 124/78 | HR 59 | Temp 97.6°F | Ht 69.0 in | Wt 212.0 lb

## 2020-09-03 DIAGNOSIS — Z0000 Encounter for general adult medical examination without abnormal findings: Secondary | ICD-10-CM

## 2020-09-03 DIAGNOSIS — I10 Essential (primary) hypertension: Secondary | ICD-10-CM

## 2020-09-03 DIAGNOSIS — E785 Hyperlipidemia, unspecified: Secondary | ICD-10-CM | POA: Diagnosis not present

## 2020-09-03 DIAGNOSIS — G4733 Obstructive sleep apnea (adult) (pediatric): Secondary | ICD-10-CM

## 2020-09-03 DIAGNOSIS — R7989 Other specified abnormal findings of blood chemistry: Secondary | ICD-10-CM | POA: Diagnosis not present

## 2020-09-03 DIAGNOSIS — Z7189 Other specified counseling: Secondary | ICD-10-CM

## 2020-09-03 DIAGNOSIS — C61 Malignant neoplasm of prostate: Secondary | ICD-10-CM

## 2020-09-03 LAB — BASIC METABOLIC PANEL
BUN: 19 mg/dL (ref 6–23)
CO2: 30 mEq/L (ref 19–32)
Calcium: 10 mg/dL (ref 8.4–10.5)
Chloride: 101 mEq/L (ref 96–112)
Creatinine, Ser: 1.41 mg/dL (ref 0.40–1.50)
GFR: 50.43 mL/min — ABNORMAL LOW (ref >60.00)
Glucose, Bld: 94 mg/dL (ref 70–99)
Potassium: 5.1 mEq/L (ref 3.5–5.1)
Sodium: 135 mEq/L (ref 135–145)

## 2020-09-03 MED ORDER — ATORVASTATIN CALCIUM 20 MG PO TABS
20.0000 mg | ORAL_TABLET | Freq: Every day | ORAL | 3 refills | Status: DC
Start: 2020-09-03 — End: 2020-12-24

## 2020-09-03 MED ORDER — METOPROLOL SUCCINATE ER 25 MG PO TB24
25.0000 mg | ORAL_TABLET | Freq: Every day | ORAL | 3 refills | Status: DC
Start: 2020-09-03 — End: 2021-11-12

## 2020-09-03 NOTE — Assessment & Plan Note (Signed)
I wouldn't change lipitor at this point.  Improved from prev.  Would continue work on diet and exercise.  Recheck Cr pending- I would address that first.

## 2020-09-03 NOTE — Progress Notes (Signed)
This visit occurred during the SARS-CoV-2 public health emergency.  Safety protocols were in place, including screening questions prior to the visit, additional usage of staff PPE, and extensive cleaning of exam room while observing appropriate contact time as indicated for disinfecting solutions.  I have personally reviewed the Medicare Annual Wellness questionnaire and have noted 1. The patient's medical and social history 2. Their use of alcohol, tobacco or illicit drugs 3. Their current medications and supplements 4. The patient's functional ability including ADL's, fall risks, home safety risks and hearing or visual             impairment. 5. Diet and physical activities 6. Evidence for depression or mood disorders  The patients weight, height, BMI have been recorded in the chart and visual acuity is per eye clinic.  I have made referrals, counseling and provided education to the patient based review of the above and I have provided the pt with a written personalized care plan for preventive services.  Provider list updated- see scanned forms.  Routine anticipatory guidance given to patient.  See health maintenance. The possibility exists that previously documented standard health maintenance information may have been brought forward from a previous encounter into this note.  If needed, that same information has been updated to reflect the current situation based on today's encounter.    Flu 2021 Shingles d/w pt.  PNA UTD Tetanus d/w pt.   Colon 2020 Prostate cancer screening prev per uro and per urology to be followed here at [facility] now.   Advance directive- wife designated if patient were incapacitated.   Cognitive function addressed- see scanned forms- and if abnormal then additional documentation follows.   Skin lesion on R upper chest wall, present for a few weeks.  No injury.  Staying about the same, not yet healed.  32mm possible AK vs benign irritation and we talked about tx vs  observation.  He wants to observe for now.  See avs.    He is going to f/u with cardiology re: aorta.  He'll call about appointment.    OSA on CPAP.  Compliant.  Some occ fatigue later in the day, attributed to age.  No AM fatigue, not waking tired.  Minimal HA in the mid AMs that isn't upon waking.  Mild, top of the head.  No focal neuro sx.  Intermittent and mild HA.    Hypertension:    Using medication without problems or lightheadedness:  yes Chest pain with exertion:no Edema:no Short of breath:no Labs d/w pt.    Cr elevation d/w pt. Recheck pending.    Elevated Cholesterol: Using medications without problems: yes Muscle aches: no Diet compliance: yes Exercise: yes Labs d/w pt.   PMH and SH reviewed  Meds, vitals, and allergies reviewed.   ROS: Per HPI.  Unless specifically indicated otherwise in HPI, the patient denies:  General: fever. Eyes: acute vision changes ENT: sore throat Cardiovascular: chest pain Respiratory: SOB GI: vomiting GU: dysuria Musculoskeletal: acute back pain Derm: acute rash Neuro: acute motor dysfunction Psych: worsening mood Endocrine: polydipsia Heme: bleeding Allergy: hayfever  GEN: nad, alert and oriented, speech wnl.   HEENT: ncat NECK: supple w/o LA CV: rrr. PULM: ctab, no inc wob ABD: soft, +bs EXT: no edema SKIN: no acute rash but 24mm mildly irritated lesion w/o ulceration on the R upper chest wall.

## 2020-09-03 NOTE — Assessment & Plan Note (Signed)
Flu 2021 Shingles d/w pt.  PNA UTD Tetanus d/w pt.   Colon 2020 Prostate cancer screening prev per uro and per urology to be followed here at [facility] now.   Advance directive- wife designated if patient were incapacitated.   Cognitive function addressed- see scanned forms- and if abnormal then additional documentation follows.

## 2020-09-03 NOTE — Assessment & Plan Note (Signed)
Advance directive- wife designated if patient were incapacitated.  

## 2020-09-03 NOTE — Assessment & Plan Note (Signed)
Controlled, continue metoprolol.  Labs d/w pt.

## 2020-09-03 NOTE — Assessment & Plan Note (Signed)
He avoids nsaids.  Recheck pending.  D/w pt.  See notes on labs.

## 2020-09-03 NOTE — Assessment & Plan Note (Signed)
Compliant.  Doesn't have early AM HA and isn't having sig fatigue.    He has mild occ HA sx and I asked him to monitor that for now.  He agrees.  He'll update me as needed.  I don't suspect that is related to OSA or any ominous dx.

## 2020-09-03 NOTE — Patient Instructions (Signed)
Go to the lab on the way out.   If you have mychart we'll likely use that to update you.    If the skin spot doesn't completely heal in the next 3-4 weeks, then let me know.   Take care.  Glad to see you. Don't change your meds for now.  Call cardiology about a follow up appointment.

## 2020-09-03 NOTE — Assessment & Plan Note (Signed)
With recent neg PSA per urology.  D/w pt about recheck PSA here in the future.

## 2020-10-29 ENCOUNTER — Other Ambulatory Visit: Payer: Self-pay

## 2020-10-29 ENCOUNTER — Encounter: Payer: Self-pay | Admitting: Family Medicine

## 2020-10-29 ENCOUNTER — Ambulatory Visit (INDEPENDENT_AMBULATORY_CARE_PROVIDER_SITE_OTHER): Payer: Medicare Other | Admitting: Family Medicine

## 2020-10-29 DIAGNOSIS — G4733 Obstructive sleep apnea (adult) (pediatric): Secondary | ICD-10-CM | POA: Diagnosis not present

## 2020-10-29 NOTE — Patient Instructions (Addendum)
Let me check on options and we'll be in touch.   Take care.  Glad to see you.

## 2020-10-29 NOTE — Progress Notes (Signed)
This visit occurred during the SARS-CoV-2 public health emergency.  Safety protocols were in place, including screening questions prior to the visit, additional usage of staff PPE, and extensive cleaning of exam room while observing appropriate contact time as indicated for disinfecting solutions.  He had covid a few weeks ago.  He had joint pain and fatigue.  It all got better.  He tested positive at home.  He was vaccinated.  No residual symptoms.  No attributed issues at this point.  He was asking about inspire OSA device.  He is still using CPAP.  Compliant but the mask is a hassle.  Full face mask helped but it is still not easy for patient.  He tried other masks.    He has had more foot pain with prolonged [place]  Pain can be dorsal or plantar.  No acute traumatic injury.  No pain currently.  Meds, vitals, and allergies reviewed.   ROS: Per HPI unless specifically indicated in ROS section   GEN: nad, alert and oriented HEENT: ncat NECK: supple w/o LA CV: rrr.  PULM: ctab, no inc wob ABD: soft, +bs EXT: no edema SKIN: Well-perfused.

## 2020-11-01 NOTE — Assessment & Plan Note (Signed)
He was asking if the inspire device would be useful.  I told him I will check with pulmonary.  We did not change anything at this point.  I want to get input from pulmonary and we will go from there.

## 2020-11-04 ENCOUNTER — Telehealth: Payer: Self-pay | Admitting: Family Medicine

## 2020-11-04 DIAGNOSIS — G4733 Obstructive sleep apnea (adult) (pediatric): Secondary | ICD-10-CM

## 2020-11-04 NOTE — Telephone Encounter (Signed)
Please check with patient.  If he wants to proceed then I need to put in a referral to ENT.  I checked with pulmonary in the meantime.  To be a candidate for the inspire device, he needs to meet the following: 1) moderate to severe sleep apnea, 2) tried CPAP and intolerant of this, 3) BMI less than 35 or 32 depending on his insurance. It looks like he would meet all these criteria. Dr. Redmond Baseman and Dr. Wilburn Cornelia with ENT do the initial evaluation. He would need to see one of them and then he would go from there.  Let me know if he wants referral.  Thanks.

## 2020-11-05 NOTE — Telephone Encounter (Signed)
Spoke with patient about below message. Patient is okay with referral to ENT

## 2020-11-05 NOTE — Telephone Encounter (Signed)
Referral ordered.  Thanks

## 2020-11-05 NOTE — Addendum Note (Signed)
Addended by: Tonia Ghent on: 11/05/2020 01:53 PM   Modules accepted: Orders

## 2020-12-17 NOTE — Progress Notes (Signed)
Cardiology Office Note:    Date:  12/18/2020   ID:  Xaine, Sansom [birth year], MRN [id number]  PCP:  Tonia Ghent, MD   [facility] Providers Cardiologist:  Candee Furbish, MD     Referring MD: Tonia Ghent, MD   Chief Complaint:  Follow-up (Thoracic aortic aneurysm, PACs, HTN)    Patient Profile:    Howard [name] is a 70 y.o. male with:   Hypertension   Thoracic aortic aneurysm  Coronary Ca2+  CT 6/19  Myoview in 2019: low risk  Atrial fibrillation - noted on stress test in 2019  Monitor: NSR, freq PACs, brief atrial runs   Prior CV studies: Chest CTA 03/06/20 IMPRESSION: Stable minor aneurysmal dilatation of the ascending thoracic aorta, maximal diameter 4.2 cm. No other acute intrathoracic finding. Cholelithiasis Aortic Atherosclerosis (ICD10-I70.0).  Echocardiogram 02/20/20 EF 60-65, no RWMA, mild LVH, GR 1 DD, normal RVSF, trivial MR, mild dilation of aortic root and ascending aorta (41 mm)  Echocardiogram 04/16/2019 EF 55-60, mild concentric LVH, normal RVSF, mild LAE, mild dilation of aortic root (42 mm)  VAS US CAROTID DUPLEX BILATERAL 04/16/2019 Bilateral Carotids: There was no evidence of thrombus, dissection, atherosclerotic plaque or stenosis in the cervical carotid system.  Event monitor 02/22/2018  The patient was monitored for 657:42 hours, of which 625:25 hours yielded usable tracings.  The predominant rhythm was sinus with an average rate of 76 bpm.  There were frequent PAC's and multiple brief atrial runs.  Rare PVC's were observed.  There was no sustained arrhythmia or prolonged pause. Predominantly sinus rhythm with frequent PAC's and brief atrial runs.  Nuclear stress test 02/16/2018 No significant ischemia, EF 50; paroxysmal atrial fibrillation noted after administration of Lexiscan, but spontaneously resolved.  ABIs 08/11/2017 Normal  History of Present Illness: Howard [name] was last seen by Dr. Marlou Porch in  1/21.  He returns for f/u.  He is here alone.  Overall, he has been doing well.  He has occasional brief chest pains on the right.  These are the same symptoms that prompted his monitor in 2019.  His symptoms are likely related to PACs.  He has not had exertional chest pain, shortness of breath, syncope, orthopnea or leg edema.  He stopped taking aspirin due to reports of aspirin and primary prevention in the media.        Past Medical History:  Diagnosis Date  . Anesthesia complication    prolonged sedation after colonoscopy  . Aortic atherosclerosis ([place])    CT 02/2020  . Colon polyps    on colonoscopy 08/2010  . ED (erectile dysfunction)   . Essential hypertension 06/25/2008   Qualifier: Diagnosis of  By: Council Mechanic MD, Hilaria Ota   . GERD (gastroesophageal reflux disease)   . HYPERLIPIDEMIA 07/24/2008   Qualifier: Diagnosis of  By: Council Mechanic MD, Hilaria Ota   . Hypersomnia 01/05/2017  . Near syncope 03/14/2017  . Neck pain 12/15/2011  . OSA (obstructive sleep apnea) 11/13/2016   Dr. Elsworth Soho  . Prostate cancer [facility])    s/p prostatectomy  . Sleep apnea 2018  . Thoracic ascending aortic aneurysm ([place]) 04/17/2017   Chest CTA 10/18: Ascending thoracic aneurysm 4.1 cm, aortic root 4.5 cm >> follow-up 1 year // Echo 8/18:  - Ascending aorta: The ascending aorta was moderately dilated, 4.3 cm. Aortic arch is 3.3 cm // CT 01/2019: Ascending aorta 3.8 cm; sinuses of Valsalva 4.5 cm; stable benign pulmonary nodules >> FU 1 year // Chest CTA 7/21:  TAA 4.2 cm, cholelithiasis, aortic atherosclerosis     Current Medications: Current Meds  Medication Sig  . atorvastatin (LIPITOR) 20 MG tablet Take 1 tablet (20 mg total) by mouth daily.  . metoprolol succinate (TOPROL-XL) 25 MG 24 hr tablet Take 1 tablet (25 mg total) by mouth daily.  Earney Navy Bicarbonate (ZEGERID) 20-1100 MG CAPS capsule Take 1 capsule by mouth daily before breakfast.     Allergies:   Ibuprofen   Social History   Tobacco  Use  . Smoking status: Never Smoker  . Smokeless tobacco: Never Used  Vaping Use  . Vaping Use: Never used  Substance Use Topics  . Alcohol use: No    Alcohol/week: 0.0 standard drinks    Comment: no  . Drug use: No     Family Hx: The patient's family history includes Stroke in his mother. There is no history of Prostate cancer, Colon cancer, Aortic dissection, Esophageal cancer, Colon polyps, Rectal cancer, or Stomach cancer.  ROS   EKGs/Labs/Other Test Reviewed:    EKG:  EKG is   ordered today.  The ekg ordered today demonstrates sinus bradycardia, HR 49, normal axis, septal Q waves, QTC 384, no change from prior tracing  Recent Labs: 08/10/2020: ALT 20 09/03/2020: BUN 19; Creatinine, Ser 1.41; Potassium 5.1; Sodium 135   Recent Lipid Panel Lab Results  Component Value Date/Time   CHOL 153 08/10/2020 08:02 AM   CHOL 150 04/13/2018 08:27 AM   TRIG 131.0 08/10/2020 08:02 AM   HDL 39.10 08/10/2020 08:02 AM   HDL 42 04/13/2018 08:27 AM   CHOLHDL 4 08/10/2020 08:02 AM   LDLCALC 87 08/10/2020 08:02 AM   LDLCALC 83 04/13/2018 08:27 AM   LDLDIRECT 172.4 12/13/2012 08:08 AM      Risk Assessment/Calculations:    CHA2DS2-VASc Score = 3  This indicates a 3.2% annual risk of stroke. The patient's score is based upon: CHF History: No HTN History: Yes Diabetes History: No Stroke History: No Vascular Disease History: Yes Age Score: 1 Gender Score: 0     Physical Exam:    VS:  BP 140/70   Pulse (!) 49   Ht 5' 9"  (1.753 m)   Wt 210 lb (95.3 kg)   SpO2 97%   BMI 31.01 kg/m     Wt Readings from Last 3 Encounters:  12/18/20 210 lb (95.3 kg)  10/29/20 213 lb (96.6 kg)  09/03/20 212 lb (96.2 kg)     Constitutional:      Appearance: Healthy appearance. Not in distress.  Neck:     Vascular: JVD normal.  Pulmonary:     Effort: Pulmonary effort is normal.     Breath sounds: No wheezing. No rales.  Cardiovascular:     Normal rate. Regular rhythm. Normal S1. Normal  S2.     Murmurs: There is no murmur.  Edema:    Peripheral edema absent.  Abdominal:     Palpations: Abdomen is soft. There is no hepatomegaly.  Skin:    General: Skin is warm and dry.  Neurological:     General: No focal deficit present.     Mental Status: Alert and oriented to person, place and time.     Cranial Nerves: Cranial nerves are intact.         ASSESSMENT & PLAN:    1. Premature atrial beats He has good control of symptoms with beta-blocker therapy.  His heart rate is 49.  He is asymptomatic with this.  We discussed symptoms to  look out for.  If he should become symptomatic, we will likely need to reduce or stop his beta-blocker.  We may also need to repeat his monitor.  For now, continue current dose of metoprolol succinate.  2. Paroxysmal atrial fibrillation (HCC) Noted during stress testing in the past.  No apparent recurrence.  If he has recurrent atrial fibrillation in the future, he will need anticoagulation.  3. Thoracic aortic aneurysm without rupture (HCC) 4.2 cm by CT in 7/21.  Repeat CT in 7/22.  4. Coronary artery calcification seen on CT scan He is not having anginal symptoms.  We had a long discussion regarding risks and benefits of aspirin for primary prevention.  Given his aortic atherosclerosis and coronary calcification, it would be reasonable to take aspirin therapy.  However, given that he has never had ACS, PCI or stroke, it would be reasonable to stay off of aspirin.  Continue statin therapy. ADDENDUM: I did discuss with Dr. Marlou Porch after the pt left the office.  He recommends continuing ASA.  I will notify the pt to resume ASA 81 mg once daily.   5. Hyperlipidemia LDL goal <70 Goal LDL should be less than 70.  Recent LDL 87.  He is fasting today.  Repeat c-Met, lipids today.  If LDL still above 70, increase atorvastatin to 40 mg daily.  6. Essential hypertension Blood pressure is usually well controlled.  It is elevated today.  I have asked him to  monitor over the next 2 weeks and send me those readings for review.  We discussed the importance of good blood pressure control in the setting of known thoracic aortic aneurysm.   Dispo:  Return in about 6 months (around 06/20/2021) for Routine Follow Up, w/ Richardson Dopp, PA-C, in person.   Medication Adjustments/Labs and Tests Ordered: Current medicines are reviewed at length with the patient today.  Concerns regarding medicines are outlined above.  Tests Ordered: Orders Placed This Encounter  Procedures  . CT ANGIO CHEST AORTA W/CM & OR WO/CM  . Comp Met (CMET)  . Lipid Profile  . Basic Metabolic Panel (BMET)  . EKG 12-Lead   Medication Changes: No orders of the defined types were placed in this encounter.   Signed, Richardson Dopp, PA-C  12/18/2020 9:06 AM    [facility] [place], [place], [place]  [address] Phone: [phone]; Fax: [phone]

## 2020-12-18 ENCOUNTER — Telehealth: Payer: Self-pay | Admitting: Physician Assistant

## 2020-12-18 ENCOUNTER — Encounter: Payer: Self-pay | Admitting: Physician Assistant

## 2020-12-18 ENCOUNTER — Ambulatory Visit (INDEPENDENT_AMBULATORY_CARE_PROVIDER_SITE_OTHER): Payer: Medicare Other | Admitting: Physician Assistant

## 2020-12-18 ENCOUNTER — Other Ambulatory Visit: Payer: Self-pay

## 2020-12-18 VITALS — BP 140/70 | HR 49 | Ht 69.0 in | Wt 210.0 lb

## 2020-12-18 DIAGNOSIS — I712 Thoracic aortic aneurysm, without rupture, unspecified: Secondary | ICD-10-CM

## 2020-12-18 DIAGNOSIS — I2510 Atherosclerotic heart disease of native coronary artery without angina pectoris: Secondary | ICD-10-CM

## 2020-12-18 DIAGNOSIS — E785 Hyperlipidemia, unspecified: Secondary | ICD-10-CM | POA: Diagnosis not present

## 2020-12-18 DIAGNOSIS — I10 Essential (primary) hypertension: Secondary | ICD-10-CM

## 2020-12-18 DIAGNOSIS — I480 Paroxysmal atrial fibrillation: Secondary | ICD-10-CM

## 2020-12-18 DIAGNOSIS — I491 Atrial premature depolarization: Secondary | ICD-10-CM

## 2020-12-18 LAB — COMPREHENSIVE METABOLIC PANEL
ALT: 26 IU/L (ref 0–44)
AST: 42 IU/L — ABNORMAL HIGH (ref 0–40)
Albumin/Globulin Ratio: 1.5 (ref 1.2–2.2)
Albumin: 4.4 g/dL (ref 3.8–4.8)
Alkaline Phosphatase: 127 IU/L — ABNORMAL HIGH (ref 44–121)
BUN/Creatinine Ratio: 15 (ref 10–24)
BUN: 21 mg/dL (ref 8–27)
Bilirubin Total: 0.6 mg/dL (ref 0.0–1.2)
CO2: 23 mmol/L (ref 20–29)
Calcium: 10.3 mg/dL — ABNORMAL HIGH (ref 8.6–10.2)
Chloride: 102 mmol/L (ref 96–106)
Creatinine, Ser: 1.43 mg/dL — ABNORMAL HIGH (ref 0.76–1.27)
Globulin, Total: 2.9 g/dL (ref 1.5–4.5)
Glucose: 102 mg/dL — ABNORMAL HIGH (ref 65–99)
Potassium: 4.9 mmol/L (ref 3.5–5.2)
Sodium: 138 mmol/L (ref 134–144)
Total Protein: 7.3 g/dL (ref 6.0–8.5)
eGFR: 53 mL/min/{1.73_m2} — ABNORMAL LOW (ref >59)

## 2020-12-18 LAB — LIPID PANEL
Chol/HDL Ratio: 3.5 ratio (ref 0.0–5.0)
Cholesterol, Total: 172 mg/dL (ref 100–199)
HDL: 49 mg/dL (ref >39)
LDL Chol Calc (NIH): 107 mg/dL — ABNORMAL HIGH (ref 0–99)
Triglycerides: 85 mg/dL (ref 0–149)
VLDL Cholesterol Cal: 16 mg/dL (ref 5–40)

## 2020-12-18 NOTE — Telephone Encounter (Signed)
Please call the pt. I reviewed the question of ASA with Dr. Marlou Porch. At his visit with me, we had a long talk and I essentially left it up to him. However, after further review, we do think it would be beneficial for him to take an ASA. He should resume ASA 81 mg once daily. Richardson Dopp, PA-C    12/18/2020 9:04 AM

## 2020-12-18 NOTE — Telephone Encounter (Signed)
lvm with Scotts recommendations.  

## 2020-12-18 NOTE — Patient Instructions (Signed)
Medication Instructions:  Your physician recommends that you continue on your current medications as directed. Please refer to the Current Medication list given to you today.  *If you need a refill on your cardiac medications before your next appointment, please call your pharmacy*   Lab Work:  TODAY!!!! CMET/LIPID  Your physician recommends that you return for lab work on Friday, July 29 between 7:30-4:30.   If you have labs (blood work) drawn today and your tests are completely normal, you will receive your results only by: Marland Kitchen MyChart Message (if you have MyChart) OR . A paper copy in the mail If you have any lab test that is abnormal or we need to change your treatment, we will call you to review the results.   Testing/Procedures: Non-Cardiac CT Angiography (CTA), is a special type of CT scan that uses a computer to produce multi-dimensional views of major blood vessels throughout the body. In CT angiography, a contrast material is injected through an IV to help visualize the blood vessels. AT [facility] on Friday, August 5 @ 9:00 am.     Follow-Up: At [facility], you and your health needs are our priority.  As part of our continuing mission to provide you with exceptional heart care, we have created designated Provider Care Teams.  These Care Teams include your primary Cardiologist (physician) and Advanced Practice Providers (APPs -  Physician Assistants and Nurse Practitioners) who all work together to provide you with the care you need, when you need it.  We recommend signing up for the patient portal called "MyChart".  Sign up information is provided on this After Visit Summary.  MyChart is used to connect with patients for Virtual Visits (Telemedicine).  Patients are able to view lab/test results, encounter notes, upcoming appointments, etc.  Non-urgent messages can be sent to your provider as well.   To learn more about what you can do with MyChart, go to  [email or web address].    Your next appointment:   6 month(s)  The format for your next appointment:   In Person  Provider:   Richardson Dopp, PA-C   Other Instructions Check BP X 2 weeks and send in readings either mychart, call @ [phone] or mail to:  [address] # [place], [place] [address]

## 2020-12-22 NOTE — Telephone Encounter (Signed)
Patient is returning call.  °

## 2020-12-22 NOTE — Telephone Encounter (Signed)
Recommendations reviewed with patient. Patient in agreement to start ASA 81mg 

## 2020-12-24 ENCOUNTER — Telehealth: Payer: Self-pay | Admitting: Physician Assistant

## 2020-12-24 DIAGNOSIS — Z9189 Other specified personal risk factors, not elsewhere classified: Secondary | ICD-10-CM

## 2020-12-24 DIAGNOSIS — Z79899 Other long term (current) drug therapy: Secondary | ICD-10-CM

## 2020-12-24 DIAGNOSIS — I2510 Atherosclerotic heart disease of native coronary artery without angina pectoris: Secondary | ICD-10-CM

## 2020-12-24 MED ORDER — ATORVASTATIN CALCIUM 40 MG PO TABS: 40.0000 mg | ORAL_TABLET | Freq: Every day | ORAL | 3 refills | Status: DC

## 2020-12-24 NOTE — Telephone Encounter (Signed)
PT is returning a call about results 

## 2020-12-24 NOTE — Telephone Encounter (Signed)
Pt verbalized understating of his lab results and will have repeat fasting labs drawn at his previously scheduled lab appt 03/12/21.

## 2020-12-25 ENCOUNTER — Telehealth: Payer: Self-pay

## 2020-12-25 NOTE — Telephone Encounter (Signed)
Patient called in and stated that this morning he woke up and a spot on the right side of his lip was numb. Patient denied SOB, chest pain, weakness, visual changes, facial drooping, abnormal speech, confusion, or other acute symptoms. Patient stated, "I feel great!" Advised patient that if he presented with any new or worsening symptoms to report to ED. Patient verbalized understanding.

## 2020-12-25 NOTE — Telephone Encounter (Signed)
If this continues we should check him in clinic.  If any other neuro sx, then to ER.  Thanks.

## 2020-12-25 NOTE — Telephone Encounter (Signed)
Spoke with the patient, he is aware of Dr. Josefine Class recommendations. He stated that this has started to get better and that he will call back for an appointment if his symptoms return. Nothing further needed at this time.

## 2020-12-28 ENCOUNTER — Other Ambulatory Visit: Payer: Self-pay | Admitting: *Deleted

## 2020-12-28 MED ORDER — ASPIRIN EC 81 MG PO TBEC: 81.0000 mg | DELAYED_RELEASE_TABLET | Freq: Every day | ORAL | 3 refills | Status: DC

## 2020-12-31 ENCOUNTER — Ambulatory Visit: Payer: Medicare Other | Admitting: Family Medicine

## 2021-03-12 ENCOUNTER — Other Ambulatory Visit: Payer: Self-pay

## 2021-03-12 ENCOUNTER — Other Ambulatory Visit: Payer: Medicare Other | Admitting: *Deleted

## 2021-03-12 DIAGNOSIS — Z9189 Other specified personal risk factors, not elsewhere classified: Secondary | ICD-10-CM

## 2021-03-12 DIAGNOSIS — I10 Essential (primary) hypertension: Secondary | ICD-10-CM

## 2021-03-12 DIAGNOSIS — Z79899 Other long term (current) drug therapy: Secondary | ICD-10-CM

## 2021-03-12 DIAGNOSIS — I491 Atrial premature depolarization: Secondary | ICD-10-CM

## 2021-03-12 DIAGNOSIS — I2510 Atherosclerotic heart disease of native coronary artery without angina pectoris: Secondary | ICD-10-CM

## 2021-03-12 DIAGNOSIS — I712 Thoracic aortic aneurysm, without rupture, unspecified: Secondary | ICD-10-CM

## 2021-03-12 DIAGNOSIS — I480 Paroxysmal atrial fibrillation: Secondary | ICD-10-CM

## 2021-03-12 DIAGNOSIS — E785 Hyperlipidemia, unspecified: Secondary | ICD-10-CM

## 2021-03-12 LAB — BASIC METABOLIC PANEL
BUN/Creatinine Ratio: 13 (ref 10–24)
BUN: 18 mg/dL (ref 8–27)
CO2: 23 mmol/L (ref 20–29)
Calcium: 10.2 mg/dL (ref 8.6–10.2)
Chloride: 102 mmol/L (ref 96–106)
Creatinine, Ser: 1.4 mg/dL — ABNORMAL HIGH (ref 0.76–1.27)
Glucose: 102 mg/dL — ABNORMAL HIGH (ref 65–99)
Potassium: 4.9 mmol/L (ref 3.5–5.2)
Sodium: 139 mmol/L (ref 134–144)
eGFR: 54 mL/min/{1.73_m2} — ABNORMAL LOW (ref >59)

## 2021-03-12 LAB — LIPID PANEL
Chol/HDL Ratio: 3.4 ratio (ref 0.0–5.0)
Cholesterol, Total: 157 mg/dL (ref 100–199)
HDL: 46 mg/dL (ref >39)
LDL Chol Calc (NIH): 95 mg/dL (ref 0–99)
Triglycerides: 83 mg/dL (ref 0–149)
VLDL Cholesterol Cal: 16 mg/dL (ref 5–40)

## 2021-03-12 LAB — HEPATIC FUNCTION PANEL
ALT: 40 IU/L (ref 0–44)
AST: 65 IU/L — ABNORMAL HIGH (ref 0–40)
Albumin: 4.1 g/dL (ref 3.7–4.7)
Alkaline Phosphatase: 139 IU/L — ABNORMAL HIGH (ref 44–121)
Bilirubin Total: 0.7 mg/dL (ref 0.0–1.2)
Bilirubin, Direct: 0.19 mg/dL (ref 0.00–0.40)
Total Protein: 7 g/dL (ref 6.0–8.5)

## 2021-03-18 ENCOUNTER — Telehealth: Payer: Self-pay | Admitting: Cardiology

## 2021-03-18 DIAGNOSIS — R7989 Other specified abnormal findings of blood chemistry: Secondary | ICD-10-CM

## 2021-03-18 NOTE — Telephone Encounter (Signed)
Creatinine stable.  K+ normal.  LDL somewhat improved but still above goal. Alk phos and AST remain elevated.  ALT normal.   PLAN:  -Give elevated alk phos and AST, would not increase Atorvastatin further.  -Ask pt if he had f/u with PCP for elevated alk phos and AST.  If not, refer toGI for elevated LFTs Richardson Dopp, PA-C    03/13/2021 8:26 PM    I spoke with patient and reviewed results with him. He has not seen PCP for several months.  Has seen Dr Fuller Plan in the past.  Patient aware referral will be made to Dr Fuller Plan for elevated LFTs

## 2021-03-18 NOTE — Telephone Encounter (Signed)
Patient is returning call to discuss lab results. 

## 2021-03-19 ENCOUNTER — Ambulatory Visit
Admission: RE | Admit: 2021-03-19 | Discharge: 2021-03-19 | Disposition: A | Discharge status: Home / Self Care | Payer: Medicare Other | Source: Ambulatory Visit | Attending: Physician Assistant | Admitting: Physician Assistant

## 2021-03-19 ENCOUNTER — Encounter: Payer: Self-pay | Admitting: Physician Assistant

## 2021-03-19 DIAGNOSIS — I712 Thoracic aortic aneurysm, without rupture, unspecified: Secondary | ICD-10-CM

## 2021-03-19 IMAGING — CT CT ANGIO CHEST
2 of 6 series · 13 of 36 positions shown · IV contrast (iopamidol)
Comparison: CT chest, [DATE].

CLINICAL DATA: Thoracic aortic aneurysm follow-up.

EXAM:
CT ANGIOGRAPHY CHEST WITH CONTRAST
TECHNIQUE: Multidetector CT imaging of the chest was performed using the
standard protocol during bolus administration of intravenous
contrast. Multiplanar CT image reconstructions and MIPs were
obtained to evaluate the vascular anatomy.
CONTRAST:  75mL [FQ] IOPAMIDOL ([FQ]) INJECTION 76%

[Series 6: cta thorax 2.00 bv36 s3 axial arterial · axial · arterial · 0.75mm/px · z∈[+1599,+1863]mm · 12 of 157 slices shown]
[im 13/157  lung]
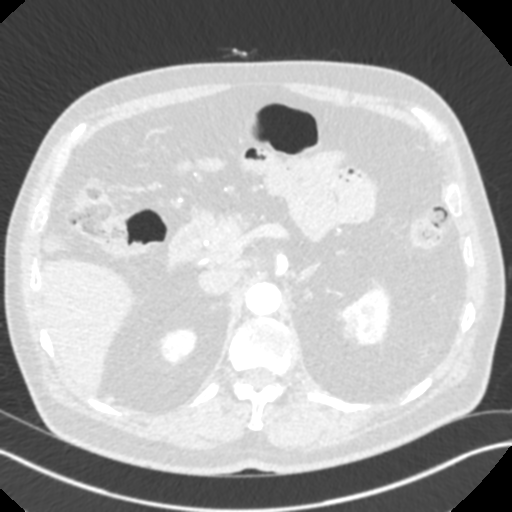
[im 25/157  mediastinal]
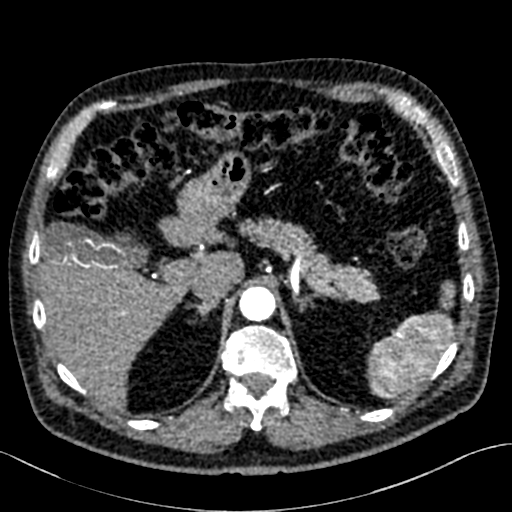
[im 37/157  lung]
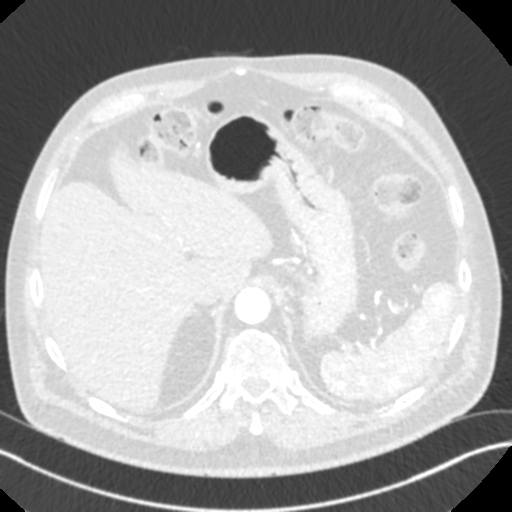
[im 49/157  mediastinal]
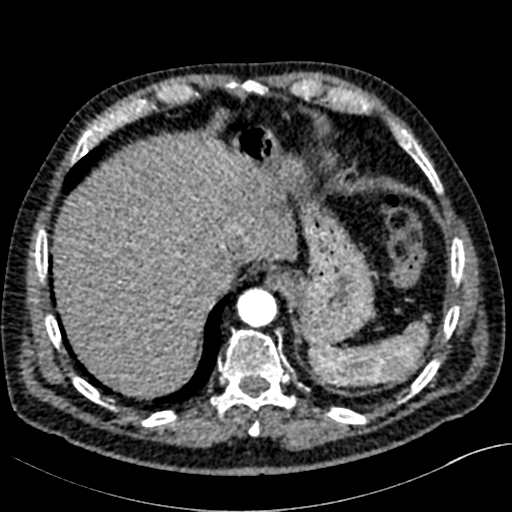
[im 61/157  lung]
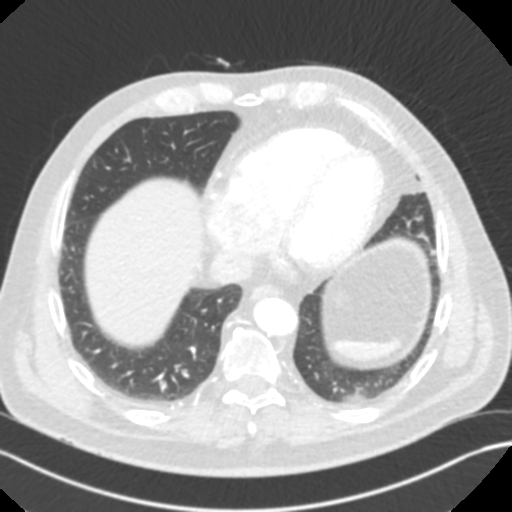
[im 73/157  mediastinal]
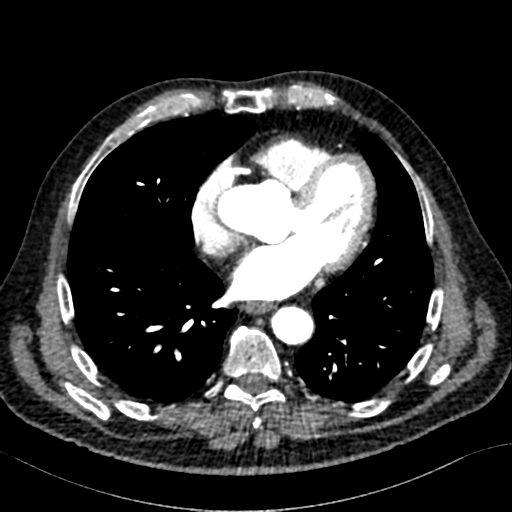
[im 85/157  lung]
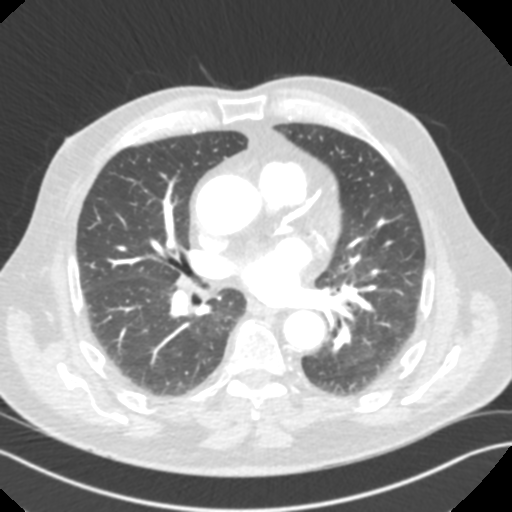
[im 97/157  mediastinal]
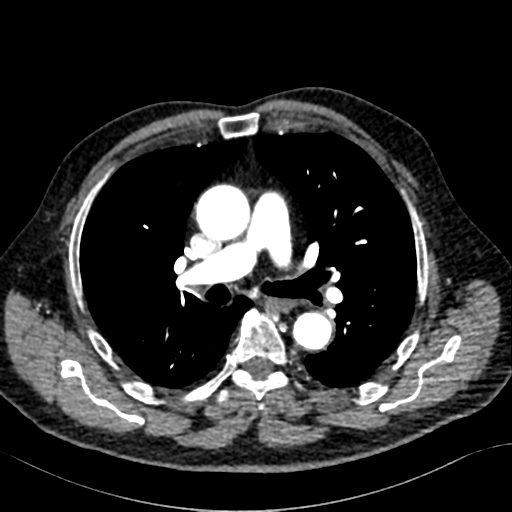
[im 109/157  lung]
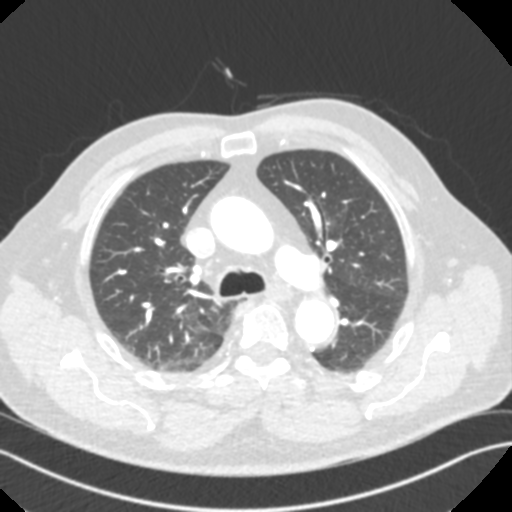
[im 121/157  mediastinal]
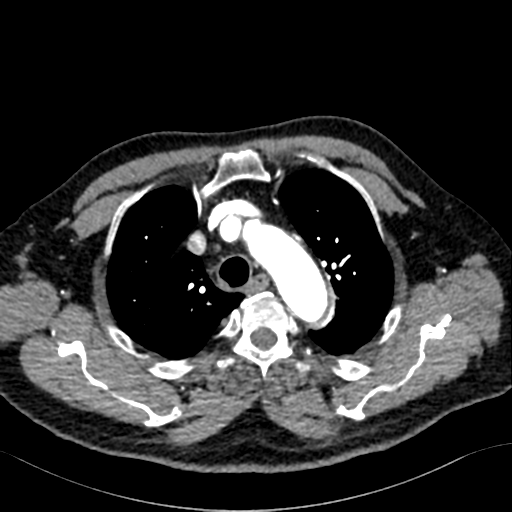
[im 133/157  lung]
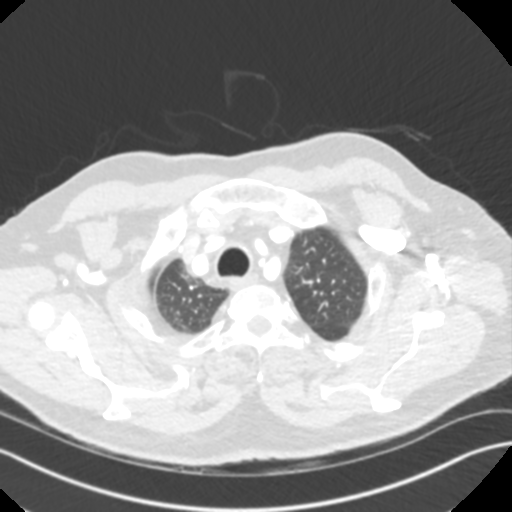
[im 145/157  mediastinal]
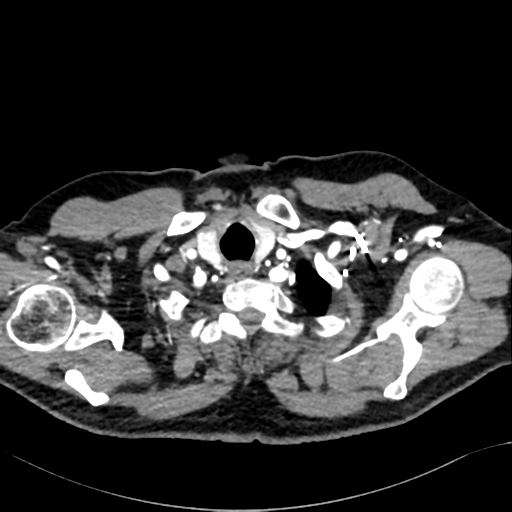

[Series 11: cta thorax 2.00 bv36 s3 cor st · coronal · 0.61mm/px · 1 of 192 slices shown]
[im 96/192  mediastinal]
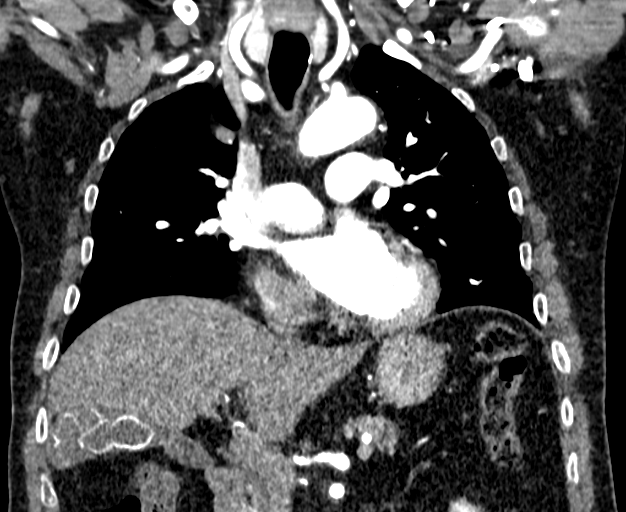

[13 of 36 positions shown; findings below may reference images not displayed]

FINDINGS: Cardiovascular:

Preferential opacification of the thoracic aorta. No central or
larger pulmonary embolus. Coronary calcifications. Normal heart
size. No pericardial effusion.

Thoracic Aorta:

--Ascending Aorta: Measures up to 4.0 cm, at the level of main PA.
Previously 4.2 cm

--Descending Aorta: Measures up to 2.9 cm, previously 2.8 cm

--mild atherosclerotic burden at the aortic arch and origin of the
great vessels.

--variant anatomy with 4-vessel arch, with arch origin of the left
vertebral artery.

Mediastinum/Nodes: No enlarged mediastinal, hilar, or axillary lymph
nodes. Thyroid gland, trachea, and esophagus demonstrate no
significant findings.

Lungs/Pleura: Azygos lobe. No focal consolidation or mass. No
suspicious pulmonary nodule. No pleural effusion or pneumothorax.

Upper Abdomen: No acute abnormality.  Cholelithiasis.

Musculoskeletal: No chest wall abnormality. Multilevel degenerative
change of imaged thoracic spine. No acute or significant osseous
findings.

Review of the MIP images confirms the above findings.
IMPRESSION: Stable, mild aneurysm dilation of the ascending thoracic aorta,
measuring up to 4.0 cm

Continue recommended annual imaging followup by CTA or MRA. This
recommendation follows [FQ]
ACCF/AHA/AATS/ACR/ASA/SCA/BREHM/BREHM/BREHM/BREHM Guidelines for the
Diagnosis and Management of Patients with Thoracic Aortic Disease.
Circulation.[FQ]; 121: [id number]. Aortic aneurysm NOS ([FQ]-[FQ])

Coronary calcifications.

Cholelithiasis.

Aortic Atherosclerosis ([FQ]-[FQ]).

## 2021-03-19 MED ORDER — IOPAMIDOL (ISOVUE-370) INJECTION 76%
75.0000 mL | Freq: Once | INTRAVENOUS | Status: AC | PRN
  Administered 2021-03-19: 75 mL via INTRAVENOUS

## 2021-04-05 ENCOUNTER — Other Ambulatory Visit: Payer: Self-pay

## 2021-04-05 ENCOUNTER — Encounter: Payer: Self-pay | Admitting: Family Medicine

## 2021-04-05 ENCOUNTER — Ambulatory Visit (INDEPENDENT_AMBULATORY_CARE_PROVIDER_SITE_OTHER): Payer: Medicare Other | Admitting: Family Medicine

## 2021-04-05 VITALS — BP 134/82 | HR 66 | Temp 97.5°F | Ht 69.0 in | Wt 205.0 lb

## 2021-04-05 DIAGNOSIS — K5900 Constipation, unspecified: Secondary | ICD-10-CM | POA: Diagnosis not present

## 2021-04-05 NOTE — Assessment & Plan Note (Signed)
Episode of constipation this past week of unclear etiology. Discussed improved fiber and water in diet to help prevent recurrent episode. Discussed regular miralax use 1/2-1 capful daily, hold for diarrhea. Constipation handout provided. Noted 8 lb weight loss since earlier this year. No other red flags. Recent reassuring imaging as well as colonoscopy from 2020 reviewed.

## 2021-04-05 NOTE — Patient Instructions (Addendum)
You can call [facility] to get appointment scheduled at [phone]  Work on increased water and fiber in the diet. May add soluble fiber supplement like benefiber or metamucil. Start taking 1/2-1 capful of miralax daily, hold for diarrhea.  Let us know if not better with this.   Constipation, Adult Constipation is when a person has fewer than three bowel movements in a week, has difficulty having a bowel movement, or has stools (feces) that are dry, hard, or larger than normal. Constipation may be caused by an underlying condition. It may become worse with age if a person takes certainmedicines and does not take in enough fluids. Follow these instructions at home: Eating and drinking  Eat foods that have a lot of fiber, such as beans, whole grains, and fresh fruits and vegetables. Limit foods that are low in fiber and high in fat and processed sugars, such as fried or sweet foods. These include french fries, hamburgers, cookies, candies, and soda. Drink enough fluid to keep your urine pale yellow.  General instructions Exercise regularly or as told by your health care provider. Try to do 150 minutes of moderate exercise each week. Use the bathroom when you have the urge to go. Do not hold it in. Take over-the-counter and prescription medicines only as told by your health care provider. This includes any fiber supplements. During bowel movements: Practice deep breathing while relaxing the lower abdomen. Practice pelvic floor relaxation. Watch your condition for any changes. Let your health care provider know about them. Keep all follow-up visits as told by your health care provider. This is important. Contact a health care provider if: You have pain that gets worse. You have a fever. You do not have a bowel movement after 4 days. You vomit. You are not hungry or you lose weight. You are bleeding from the opening between the buttocks (anus). You have thin, pencil-like stools. Get help  right away if: You have a fever and your symptoms suddenly get worse. You leak stool or have blood in your stool. Your abdomen is bloated. You have severe pain in your abdomen. You feel dizzy or you faint. Summary Constipation is when a person has fewer than three bowel movements in a week, has difficulty having a bowel movement, or has stools (feces) that are dry, hard, or larger than normal. Eat foods that have a lot of fiber, such as beans, whole grains, and fresh fruits and vegetables. Drink enough fluid to keep your urine pale yellow. Take over-the-counter and prescription medicines only as told by your health care provider. This includes any fiber supplements. This information is not intended to replace advice given to you by your health care provider. Make sure you discuss any questions you have with your healthcare provider. Document Revised: 06/19/2019 Document Reviewed: 06/19/2019 Elsevier Patient Education  [place].

## 2021-04-05 NOTE — Progress Notes (Signed)
Patient ID: Howard [name], male    DOB: 04-26-1951, 70 y.o.   MRN: [id number]  This visit was conducted in person.  BP 134/82   Pulse 66   Temp (!) 97.5 F (36.4 C) (Temporal)   Ht 5' 9"  (1.753 m)   Wt 205 lb (93 kg)   SpO2 97%   BMI 30.27 kg/m    CC: constipation Subjective:   HPI: Howard [name] is a 70 y.o. male presenting on 04/05/2021 for blocked bowels (Patient had a bowel movement today around 12 pm. )   Normally very regular with daily BM. Last week no BM from Monday throughout whole week. Had been taking stool softeners without benefit. He self treated at home with small amount of magnesium citrate without benefit. Saturday he took miralax 1 capful without benefit. Was having some abdominal discomfort. He attempted fecal disimpaction without benefit. Took senna+ yesterday - then had large BM today with significant relief.   No further abd pain, no blood in stool, nausea/vomiting, diarrhea.  Denies new foods, medicines, supplements, vitamins.   He had similar episode several months ago which he effectively treated with magnesium citrate.    Not great with fiber and water in diet.   Going to see GI Fuller Plan) for persistently mildly elevated ALP/AST - referred by cards. Asks abut appt (placed 03/19/2021).   Recent CTA chest imaging study 03/2021 reviewed - gallstones MRI abdomen w/w/o contrast 05/2019 also reviewed - overall benign, mild diffuse hepatic steatosis, hepatic hemangioma.   Colonoscopy 06/2019 - 3 TAs, lipoma (leiomyoma), int hemorrhoids, rpt 3 yrs Fuller Plan)     Relevant past medical, surgical, family and social history reviewed and updated as indicated. Interim medical history since our last visit reviewed. Allergies and medications reviewed and updated. Outpatient Medications Prior to Visit  Medication Sig Dispense Refill   aspirin EC 81 MG tablet Take 1 tablet (81 mg total) by mouth daily. Swallow whole. 90 tablet 3   atorvastatin (LIPITOR) 40 MG  tablet Take 1 tablet (40 mg total) by mouth daily. 90 tablet 3   metoprolol succinate (TOPROL-XL) 25 MG 24 hr tablet Take 1 tablet (25 mg total) by mouth daily. 90 tablet 3   Omeprazole-Sodium Bicarbonate (ZEGERID) 20-1100 MG CAPS capsule Take 1 capsule by mouth daily before breakfast.     No facility-administered medications prior to visit.     Per HPI unless specifically indicated in ROS section below Review of Systems  Objective:  BP 134/82   Pulse 66   Temp (!) 97.5 F (36.4 C) (Temporal)   Ht 5' 9"  (1.753 m)   Wt 205 lb (93 kg)   SpO2 97%   BMI 30.27 kg/m   Wt Readings from Last 3 Encounters:  04/05/21 205 lb (93 kg)  12/18/20 210 lb (95.3 kg)  10/29/20 213 lb (96.6 kg)      Physical Exam Vitals and nursing note reviewed.  Constitutional:      Appearance: Normal appearance. He is not ill-appearing.  Cardiovascular:     Rate and Rhythm: Normal rate and regular rhythm.     Pulses: Normal pulses.     Heart sounds: Normal heart sounds. No murmur heard. Pulmonary:     Effort: Pulmonary effort is normal. No respiratory distress.     Breath sounds: Normal breath sounds. No wheezing, rhonchi or rales.  Abdominal:     General: Bowel sounds are normal. There is no distension.     Palpations: Abdomen is soft. There is  no mass.     Tenderness: There is no abdominal tenderness. There is no guarding or rebound. Negative signs include Murphy's sign.     Hernia: No hernia is present.  Musculoskeletal:     Right lower leg: No edema.     Left lower leg: No edema.  Skin:    General: Skin is warm and dry.     Findings: No rash.  Neurological:     Mental Status: He is alert.  Psychiatric:        Mood and Affect: Mood normal.        Behavior: Behavior normal.      Results for orders placed or performed in visit on 68/25/74  Basic Metabolic Panel (BMET)  Result Value Ref Range   Glucose 102 (H) 65 - 99 mg/dL   BUN 18 8 - 27 mg/dL   Creatinine, Ser 1.40 (H) 0.76 - 1.27 mg/dL    eGFR 54 (L) >59 mL/min/1.73   BUN/Creatinine Ratio 13 10 - 24   Sodium 139 134 - 144 mmol/L   Potassium 4.9 3.5 - 5.2 mmol/L   Chloride 102 96 - 106 mmol/L   CO2 23 20 - 29 mmol/L   Calcium 10.2 8.6 - 10.2 mg/dL  Lipid Profile  Result Value Ref Range   Cholesterol, Total 157 100 - 199 mg/dL   Triglycerides 83 0 - 149 mg/dL   HDL 46 >39 mg/dL   VLDL Cholesterol Cal 16 5 - 40 mg/dL   LDL Chol Calc (NIH) 95 0 - 99 mg/dL   Chol/HDL Ratio 3.4 0.0 - 5.0 ratio  Hepatic function panel  Result Value Ref Range   Total Protein 7.0 6.0 - 8.5 g/dL   Albumin 4.1 3.7 - 4.7 g/dL   Bilirubin Total 0.7 0.0 - 1.2 mg/dL   Bilirubin, Direct 0.19 0.00 - 0.40 mg/dL   Alkaline Phosphatase 139 (H) 44 - 121 IU/L   AST 65 (H) 0 - 40 IU/L   ALT 40 0 - 44 IU/L    Assessment & Plan:  This visit occurred during the SARS-CoV-2 public health emergency.  Safety protocols were in place, including screening questions prior to the visit, additional usage of staff PPE, and extensive cleaning of exam room while observing appropriate contact time as indicated for disinfecting solutions.   Problem List Items Addressed This Visit     Constipation - Primary    Episode of constipation this past week of unclear etiology. Discussed improved fiber and water in diet to help prevent recurrent episode. Discussed regular miralax use 1/2-1 capful daily, hold for diarrhea. Constipation handout provided. Noted 8 lb weight loss since earlier this year. No other red flags. Recent reassuring imaging as well as colonoscopy from 2020 reviewed.         No orders of the defined types were placed in this encounter.  No orders of the defined types were placed in this encounter.   Patient instructions: You can call [facility] to get appointment scheduled at [phone]  Work on increased water and fiber in the diet. May add soluble fiber supplement like benefiber or metamucil. Start taking 1/2-1 capful of miralax daily, hold  for diarrhea.  Let us know if not better with this.   Follow up plan: Return if symptoms worsen or fail to improve.  Ria Bush, MD

## 2021-04-30 ENCOUNTER — Ambulatory Visit: Payer: Medicare Other | Admitting: Physician Assistant

## 2021-05-26 ENCOUNTER — Ambulatory Visit (INDEPENDENT_AMBULATORY_CARE_PROVIDER_SITE_OTHER): Payer: Medicare Other | Admitting: Gastroenterology

## 2021-05-26 ENCOUNTER — Other Ambulatory Visit (INDEPENDENT_AMBULATORY_CARE_PROVIDER_SITE_OTHER): Payer: Medicare Other

## 2021-05-26 ENCOUNTER — Encounter: Payer: Self-pay | Admitting: Gastroenterology

## 2021-05-26 VITALS — BP 128/70 | HR 56 | Ht 69.0 in | Wt 204.0 lb

## 2021-05-26 DIAGNOSIS — K760 Fatty (change of) liver, not elsewhere classified: Secondary | ICD-10-CM | POA: Insufficient documentation

## 2021-05-26 DIAGNOSIS — R7989 Other specified abnormal findings of blood chemistry: Secondary | ICD-10-CM

## 2021-05-26 LAB — CBC WITH DIFFERENTIAL/PLATELET
Basophils Absolute: 0.1 10*3/uL (ref 0.0–0.1)
Basophils Relative: 0.6 % (ref 0.0–3.0)
Eosinophils Absolute: 0.3 10*3/uL (ref 0.0–0.7)
Eosinophils Relative: 2.8 % (ref 0.0–5.0)
HCT: 44.8 % (ref 39.0–52.0)
Hemoglobin: 14.9 g/dL (ref 13.0–17.0)
Lymphocytes Relative: 29.5 % (ref 12.0–46.0)
Lymphs Abs: 2.7 10*3/uL (ref 0.7–4.0)
MCHC: 33.4 g/dL (ref 30.0–36.0)
MCV: 89 fl (ref 78.0–100.0)
Monocytes Absolute: 1.1 10*3/uL — ABNORMAL HIGH (ref 0.1–1.0)
Monocytes Relative: 12.5 % — ABNORMAL HIGH (ref 3.0–12.0)
Neutro Abs: 4.9 10*3/uL (ref 1.4–7.7)
Neutrophils Relative %: 54.6 % (ref 43.0–77.0)
Platelets: 241 10*3/uL (ref 150.0–400.0)
RBC: 5.04 Mil/uL (ref 4.22–5.81)
RDW: 14.1 % (ref 11.5–15.5)
WBC: 9.1 10*3/uL (ref 4.0–10.5)

## 2021-05-26 LAB — IBC + FERRITIN
Ferritin: 230.9 ng/mL (ref 22.0–322.0)
Iron: 100 ug/dL (ref 42–165)
Saturation Ratios: 25.8 % (ref 20.0–50.0)
TIBC: 387.8 ug/dL (ref 250.0–450.0)
Transferrin: 277 mg/dL (ref 212.0–360.0)

## 2021-05-26 LAB — COMPREHENSIVE METABOLIC PANEL
ALT: 41 U/L (ref 0–53)
AST: 64 U/L — ABNORMAL HIGH (ref 0–37)
Albumin: 4.3 g/dL (ref 3.5–5.2)
Alkaline Phosphatase: 151 U/L — ABNORMAL HIGH (ref 39–117)
BUN: 14 mg/dL (ref 6–23)
CO2: 26 mEq/L (ref 19–32)
Calcium: 10.3 mg/dL (ref 8.4–10.5)
Chloride: 101 mEq/L (ref 96–112)
Creatinine, Ser: 1.27 mg/dL (ref 0.40–1.50)
GFR: 56.88 mL/min — ABNORMAL LOW (ref >60.00)
Glucose, Bld: 98 mg/dL (ref 70–99)
Potassium: 4.3 mEq/L (ref 3.5–5.1)
Sodium: 137 mEq/L (ref 135–145)
Total Bilirubin: 0.9 mg/dL (ref 0.2–1.2)
Total Protein: 8.1 g/dL (ref 6.0–8.3)

## 2021-05-26 LAB — PROTIME-INR
INR: 1 ratio (ref 0.8–1.0)
Prothrombin Time: 11.1 s (ref 9.6–13.1)

## 2021-05-26 LAB — GAMMA GT: GGT: 160 U/L — ABNORMAL HIGH (ref 7–51)

## 2021-05-26 NOTE — Patient Instructions (Addendum)
Your provider has requested that you go to the basement level for lab work before leaving today. Press "B" on the elevator. The lab is located at the first door on the left as you exit the elevator.  If you are age 71 or older, your body mass index should be between 23-30. Your Body mass index is 30.13 kg/m. If this is out of the aforementioned range listed, please consider follow up with your Primary Care Provider.  If you are age 72 or younger, your body mass index should be between 19-25. Your Body mass index is 30.13 kg/m. If this is out of the aformentioned range listed, please consider follow up with your Primary Care Provider.   __________________________________________________________  The [facility] providers would like to encourage you to use [facility] to communicate with providers for non-urgent requests or questions.  Due to long hold times on the telephone, sending your provider a message by [facility] may be a faster and more efficient way to get a response.  Please allow 48 business hours for a response.  Please remember that this is for non-urgent requests.

## 2021-05-26 NOTE — Progress Notes (Signed)
05/26/2021 Howard [name] [id number] 1950-09-04   HISTORY OF PRESENT ILLNESS: This is a 70 year old male who is a patient of Dr. Lynne Leader.  The patient is here today for evaluation of elevated LFTs.  He actually had a mild elevation of alk phos 1-2 years ago with a peak of 154.  Other LFTs were normal at that time.  He was acutally seen by Dr. Fuller Plan for this issue.  Liver and intestinal components of ALP isoenzymes were normal at that time.  Then the alk phos normalized and was elevated again very slightly again in May and July of this year, but also AST slightly elevated this time as well.  Still very mild elevations with ALP 139 and AST 65.  He does have fatty liver as seen on ultrasound and MRI in 2020.  He denies ETOH use or family history of liver disease to his knowledge.  He has no complaints today including abdominal pain.  He is on Lipitor 40 mg daily.  Past Medical History:  Diagnosis Date   Anesthesia complication    prolonged sedation after colonoscopy   Aortic atherosclerosis ([place])    CT 02/2020   Colon polyps    on colonoscopy 08/2010   ED (erectile dysfunction)    Essential hypertension 06/25/2008   Qualifier: Diagnosis of  By: Council Mechanic MD, Hilaria Ota    GERD (gastroesophageal reflux disease)    HYPERLIPIDEMIA 07/24/2008   Qualifier: Diagnosis of  By: Council Mechanic MD, Hilaria Ota    Hypersomnia 01/05/2017   Near syncope 03/14/2017   Neck pain 12/15/2011   OSA (obstructive sleep apnea) 11/13/2016   Dr. Elsworth Soho   Prostate cancer [facility])    s/p prostatectomy   Sleep apnea 2018   Thoracic ascending aortic aneurysm 04/17/2017   Chest CTA 10/18: Aorta 4.1 cm// Echo 8/18:  - 4.3 cm. Aortic arch is 3.3 cm // CT 01/2019: aorta 3.8 cm; sinuses of Valsalva 4.5 cm // Chest CTA 7/21: TAA 4.2 cm, cholelithiasis, aortic atherosclerosis // Chest CTA 8/22: Ascending thoracic aorta 4 cm, coronary calcification, cholelithiasis, aortic atherosclerosis//Chest CTA 8/22: Ascending thoracic aorta  4 cm   Past Surgical History:  Procedure Laterality Date   COLONOSCOPY     ESOPHAGOGASTRODUODENOSCOPY  10/02/2000   dilated reflux esopsh hh   [facility]: Dehydration; vasvagal sync  12/09/ 05/2003   PLANTAR FASCIA SURGERY Bilateral    POLYPECTOMY     ROBOT ASSISTED LAPAROSCOPIC RADICAL PROSTATECTOMY N/A 05/27/2013   Procedure: ROBOTIC ASSISTED LAPAROSCOPIC RADICAL PROSTATECTOMY LEVEL 1;  Surgeon: Dutch Gray, MD;  Location: [facility];  Service: Urology;  Laterality: N/A;   UPPER GASTROINTESTINAL ENDOSCOPY      reports that he has never smoked. He has never used smokeless tobacco. He reports that he does not drink alcohol and does not use drugs. family history includes Stroke in his mother. Allergies  Allergen Reactions   Ibuprofen     "Eats a hole in the lining of his stomach"      Outpatient Encounter Medications as of 05/26/2021  Medication Sig   aspirin EC 81 MG tablet Take 1 tablet (81 mg total) by mouth daily. Swallow whole.   atorvastatin (LIPITOR) 40 MG tablet Take 1 tablet (40 mg total) by mouth daily.   metoprolol succinate (TOPROL-XL) 25 MG 24 hr tablet Take 1 tablet (25 mg total) by mouth daily.   Omeprazole-Sodium Bicarbonate (ZEGERID) 20-1100 MG CAPS capsule Take 1 capsule by mouth daily before breakfast.   No facility-administered encounter medications on file  as of 05/26/2021.     REVIEW OF SYSTEMS  : All other systems reviewed and negative except where noted in the History of Present Illness.   PHYSICAL EXAM: BP 128/70   Pulse (!) 56   Ht 5' 9"  (1.753 m)   Wt 204 lb (92.5 kg)   BMI 30.13 kg/m  General: Well developed white male in no acute distress Head: Normocephalic and atraumatic Eyes:  Sclerae anicteric, conjunctiva pink. Ears: Normal auditory acuity Lungs: Clear throughout to auscultation; no W/R/R. Heart: Regular rate and rhythm; no M/R/G. Abdomen: Soft, non-distended.  BS present.  Non-tender. Musculoskeletal: Symmetrical with no gross deformities   Skin: No lesions on visible extremities Extremities: No edema  Neurological: Alert oriented x 4, grossly non-focal Psychological:  Alert and cooperative. Normal mood and affect  ASSESSMENT AND PLAN: *Elevated LFTs: Had a mild elevation of alk phos 1-2 years ago with a peak of 154.  Other LFTs were normal at that time.  Then the alk phos normalized and was elevated again very slightly in May and July of this year, but also AST slightly elevated this time as well.  He does have fatty liver as seen on ultrasound and MRI in 2020.  That could very well be causing this.  He denies alcohol use and there is no family history of liver disease to his knowledge.  We will plan to do the extensive liver serologic evaluation to rule out other causes of chronic liver disease, but otherwise if that proves to be negative then this elevation is likely due to fatty liver.  This is no reason that he cannot continue his statin or even have the dose is increased if cardiology deems necessary.  We will check viral hepatitis studies, autoimmune/hereditary labs, etc.  In regards to the fatty liver we dicussed regular exercise, good diet, weight loss and good cholesterol/blood sugar control.   CC:  Tonia Ghent, MD

## 2021-05-26 NOTE — Progress Notes (Signed)
Reviewed and agree with management plan.  Raghad Lorenz T. Angala Hilgers, MD FACG 

## 2021-06-02 ENCOUNTER — Other Ambulatory Visit: Payer: Self-pay | Admitting: *Deleted

## 2021-06-02 DIAGNOSIS — K760 Fatty (change of) liver, not elsewhere classified: Secondary | ICD-10-CM

## 2021-06-02 DIAGNOSIS — R7989 Other specified abnormal findings of blood chemistry: Secondary | ICD-10-CM

## 2021-06-02 LAB — ALPHA-1-ANTITRYPSIN: A-1 Antitrypsin, Ser: 200 mg/dL — ABNORMAL HIGH (ref 83–199)

## 2021-06-02 LAB — ANTI-SMOOTH MUSCLE ANTIBODY, IGG: Actin (Smooth Muscle) Antibody (IGG): <20 U (ref <20)

## 2021-06-02 LAB — MITOCHONDRIAL ANTIBODIES: Mitochondrial M2 Ab, IgG: <=20 U (ref <=20.0)

## 2021-06-02 LAB — HEPATITIS C ANTIBODY
Hepatitis C Ab: NONREACTIVE
SIGNAL TO CUT-OFF: 0.02 (ref <1.00)

## 2021-06-02 LAB — HEPATITIS B SURFACE ANTIGEN: Hepatitis B Surface Ag: NONREACTIVE

## 2021-06-02 LAB — HEPATITIS B CORE ANTIBODY, TOTAL: Hep B Core Total Ab: NONREACTIVE

## 2021-06-02 LAB — CERULOPLASMIN: Ceruloplasmin: 36 mg/dL (ref 18–36)

## 2021-06-02 LAB — TISSUE TRANSGLUTAMINASE ABS,IGG,IGA
(tTG) Ab, IgA: <1 U/mL
(tTG) Ab, IgG: <1 U/mL

## 2021-06-02 LAB — HEPATITIS B SURFACE ANTIBODY,QUALITATIVE: Hep B S Ab: REACTIVE — AB

## 2021-06-02 LAB — IGG: IgG (Immunoglobin G), Serum: 1594 mg/dL — ABNORMAL HIGH (ref 600–1540)

## 2021-06-02 LAB — HEPATITIS A ANTIBODY, TOTAL: Hepatitis A AB,Total: NONREACTIVE

## 2021-06-02 LAB — IGA: Immunoglobulin A: 229 mg/dL (ref 70–320)

## 2021-06-03 ENCOUNTER — Telehealth: Payer: Self-pay | Admitting: Gastroenterology

## 2021-06-03 ENCOUNTER — Other Ambulatory Visit: Payer: Medicare Other

## 2021-06-03 DIAGNOSIS — K760 Fatty (change of) liver, not elsewhere classified: Secondary | ICD-10-CM

## 2021-06-03 DIAGNOSIS — R7989 Other specified abnormal findings of blood chemistry: Secondary | ICD-10-CM

## 2021-06-03 NOTE — Telephone Encounter (Signed)
Janett Billow, I am covering Heathers box. I do not see another phone note started  Do you know why she would have called this patient so I can return his call

## 2021-06-03 NOTE — Telephone Encounter (Signed)
Patient received a voicemail asking him to return a call.  Thank you.

## 2021-06-07 LAB — ANTI-NUCLEAR AB-TITER (ANA TITER): ANA Titer 1: 1:320 {titer} — ABNORMAL HIGH

## 2021-06-07 LAB — ANA: Anti Nuclear Antibody (ANA): POSITIVE — AB

## 2021-06-07 NOTE — Telephone Encounter (Signed)
Nira Conn confirmed she had already  spoke with patient

## 2021-06-11 ENCOUNTER — Telehealth: Payer: Self-pay | Admitting: *Deleted

## 2021-06-11 DIAGNOSIS — I2510 Atherosclerotic heart disease of native coronary artery without angina pectoris: Secondary | ICD-10-CM

## 2021-06-11 NOTE — Telephone Encounter (Signed)
-----   Message from Liliane Shi, [place] sent at 06/09/2021  5:33 PM EDT ----- Reviewed recent labs drawn by GI.  F/u lab was just an ANA. This will not affect whether or not to change cholesterol med.  Let's schedule fasting Lipids in 3 mos.  Richardson Dopp, PA-C    06/09/2021 5:32 PM

## 2021-06-16 ENCOUNTER — Telehealth: Payer: Self-pay | Admitting: Gastroenterology

## 2021-06-16 ENCOUNTER — Other Ambulatory Visit: Payer: Self-pay | Admitting: Family Medicine

## 2021-06-16 DIAGNOSIS — E559 Vitamin D deficiency, unspecified: Secondary | ICD-10-CM

## 2021-06-16 DIAGNOSIS — E785 Hyperlipidemia, unspecified: Secondary | ICD-10-CM

## 2021-06-16 DIAGNOSIS — Z125 Encounter for screening for malignant neoplasm of prostate: Secondary | ICD-10-CM

## 2021-06-16 NOTE — Telephone Encounter (Signed)
see 11/2 results note

## 2021-06-16 NOTE — Telephone Encounter (Signed)
Patient is returning your call.  

## 2021-07-19 ENCOUNTER — Other Ambulatory Visit: Payer: Self-pay | Admitting: Nurse Practitioner

## 2021-07-19 DIAGNOSIS — K7401 Hepatic fibrosis, early fibrosis: Secondary | ICD-10-CM

## 2021-07-19 DIAGNOSIS — K760 Fatty (change of) liver, not elsewhere classified: Secondary | ICD-10-CM

## 2021-07-19 DIAGNOSIS — R7989 Other specified abnormal findings of blood chemistry: Secondary | ICD-10-CM

## 2021-08-05 ENCOUNTER — Ambulatory Visit
Admission: RE | Admit: 2021-08-05 | Discharge: 2021-08-05 | Disposition: A | Discharge status: Home / Self Care | Payer: Medicare Other | Source: Ambulatory Visit | Attending: Nurse Practitioner | Admitting: Nurse Practitioner

## 2021-08-05 ENCOUNTER — Other Ambulatory Visit: Payer: Self-pay

## 2021-08-05 DIAGNOSIS — R7989 Other specified abnormal findings of blood chemistry: Secondary | ICD-10-CM

## 2021-08-05 DIAGNOSIS — K760 Fatty (change of) liver, not elsewhere classified: Secondary | ICD-10-CM

## 2021-08-05 DIAGNOSIS — K7401 Hepatic fibrosis, early fibrosis: Secondary | ICD-10-CM

## 2021-08-05 IMAGING — US US ABDOMEN LIMITED
1 series · 14 of 25 positions shown · non-contrast
Comparison: None.

CLINICAL DATA: Elevated LFTs.

EXAM:
ULTRASOUND ABDOMEN LIMITED RIGHT UPPER QUADRANT

[Series 1: us abdomen limited · 0.17mm/px · 14 of 31 slices shown]
[im 1/31]
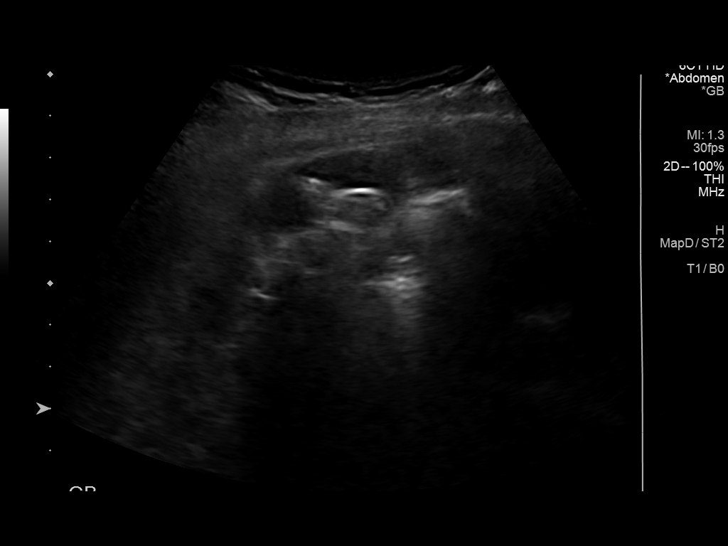
[im 3/31]
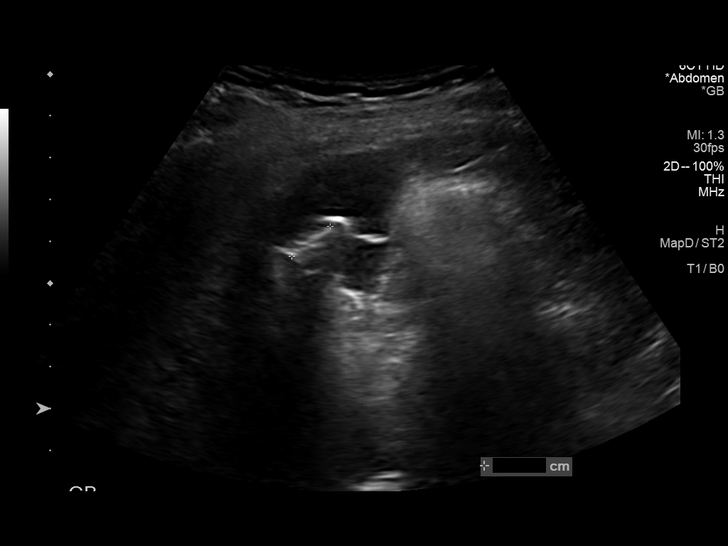
[im 6/31]
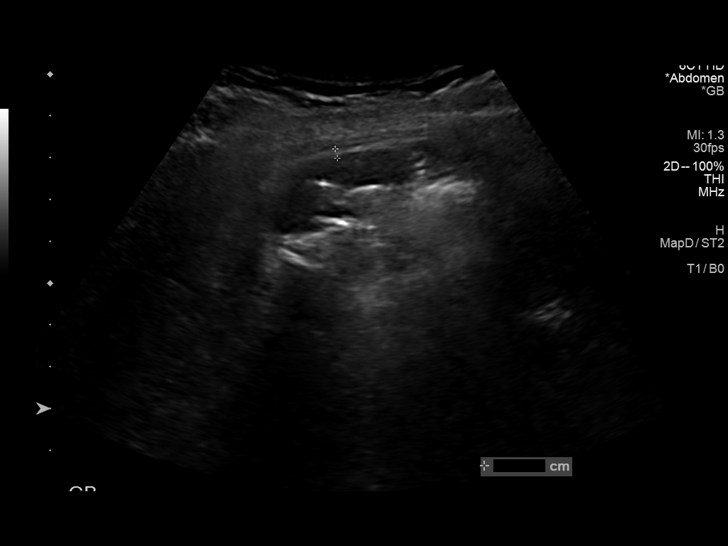
[im 8/31]
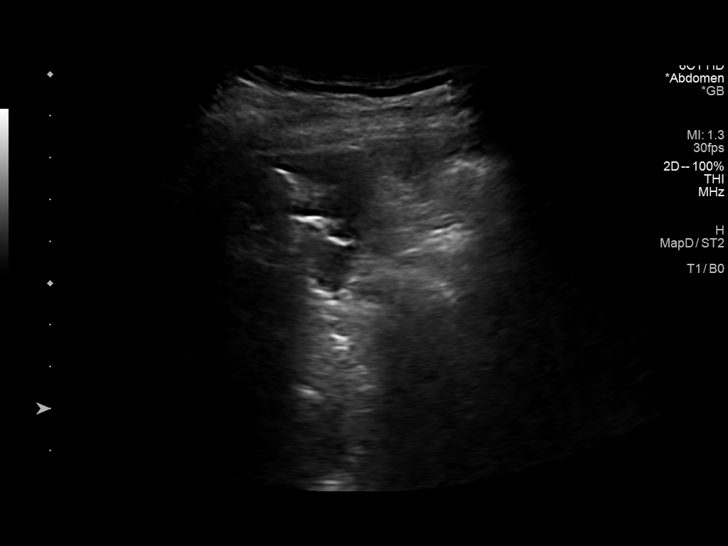
[im 11/31]
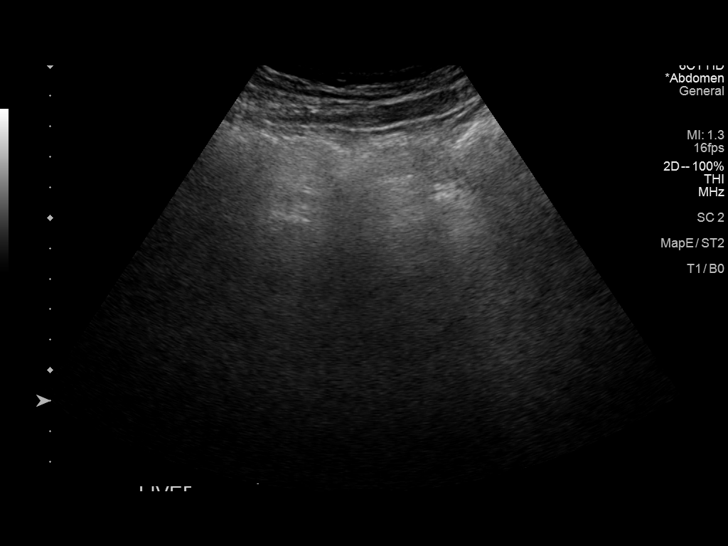
[im 12/31]
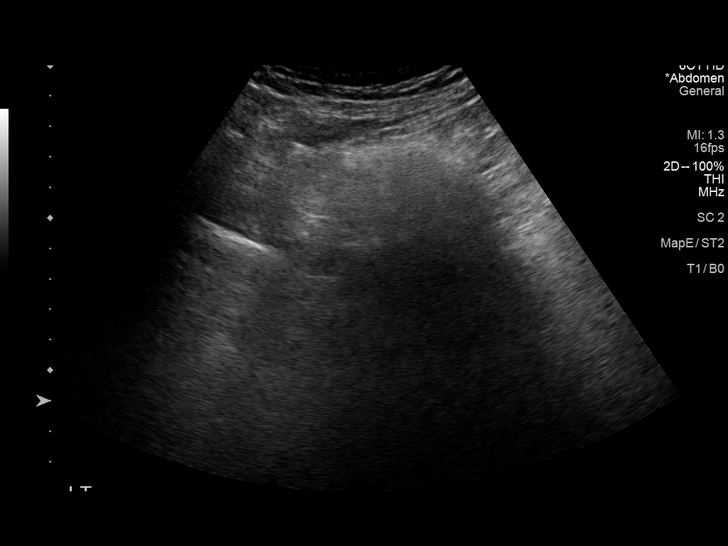
[im 14/31]
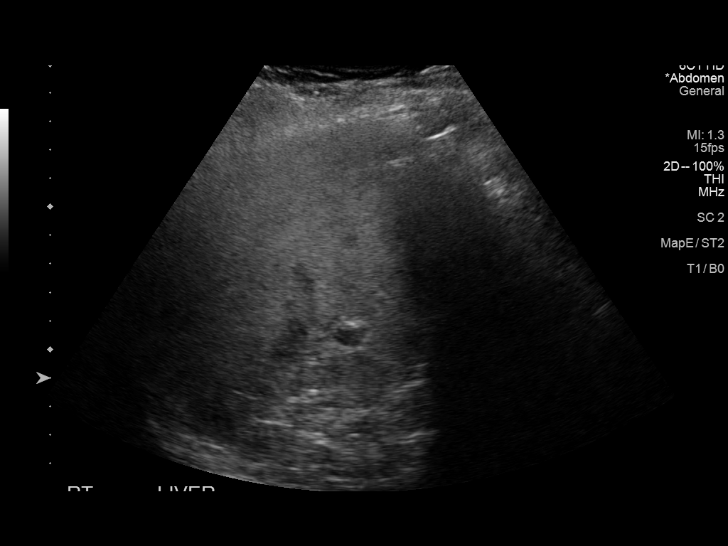
[im 17/31]
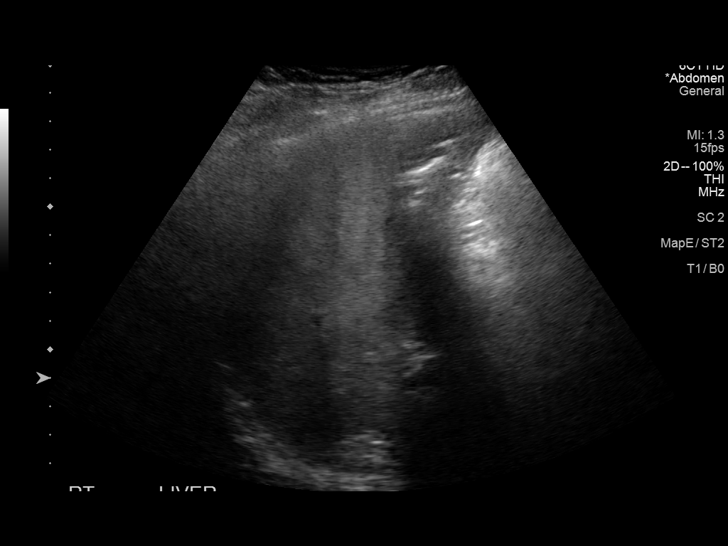
[im 19/31]
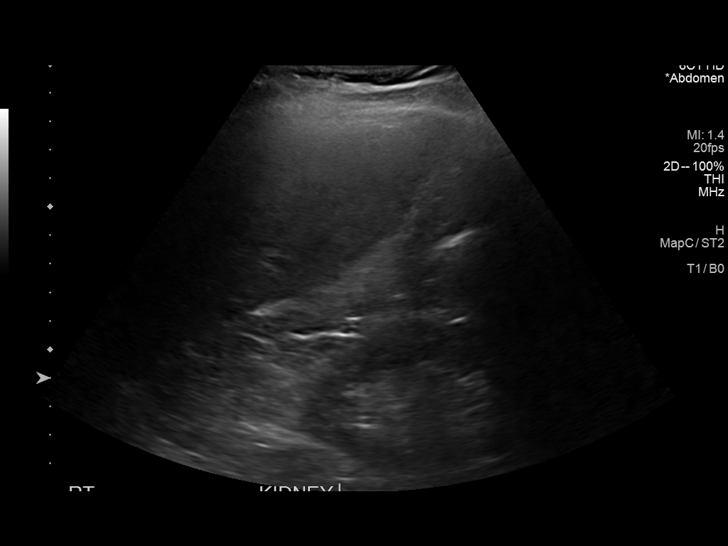
[im 21/31]
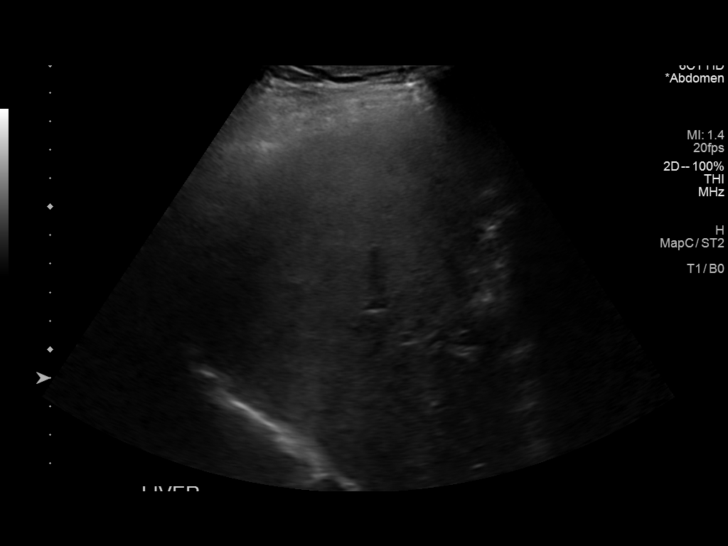
[im 23/31]
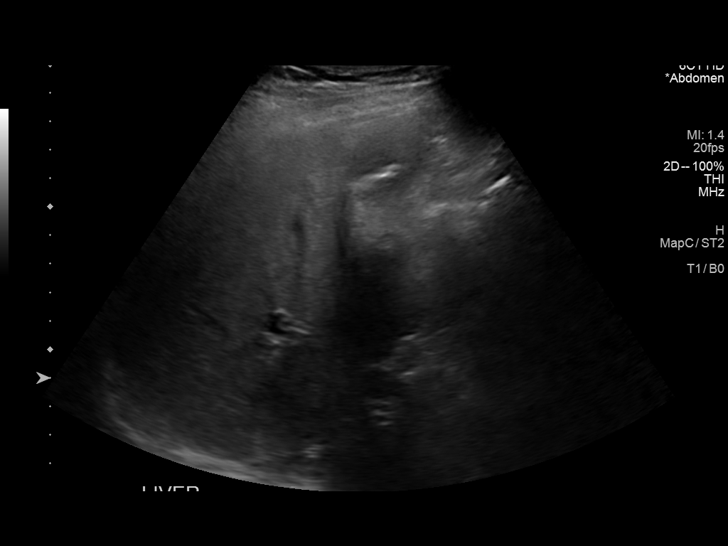
[im 26/31]
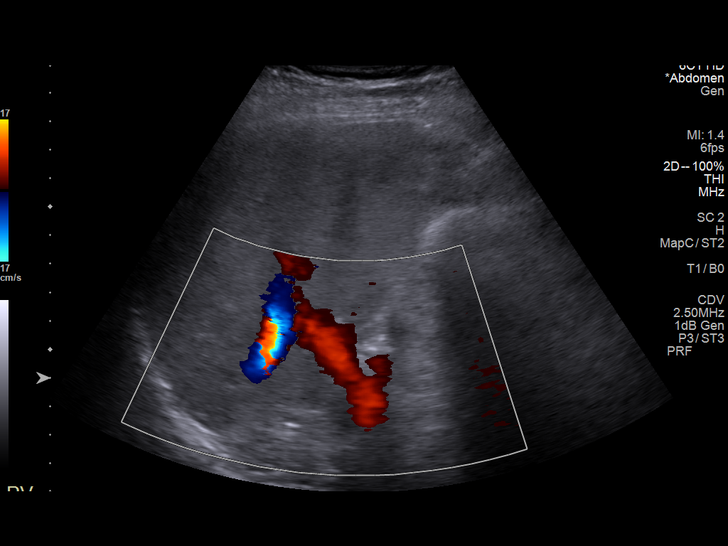
[im 28/31]
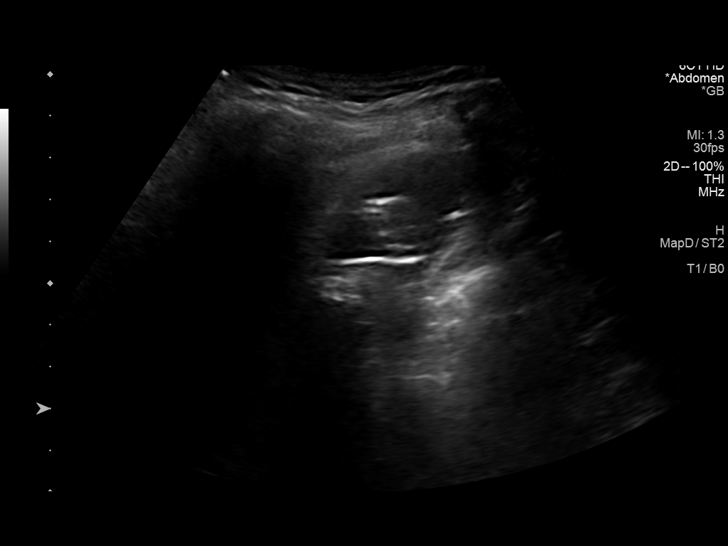
[im 31/31]
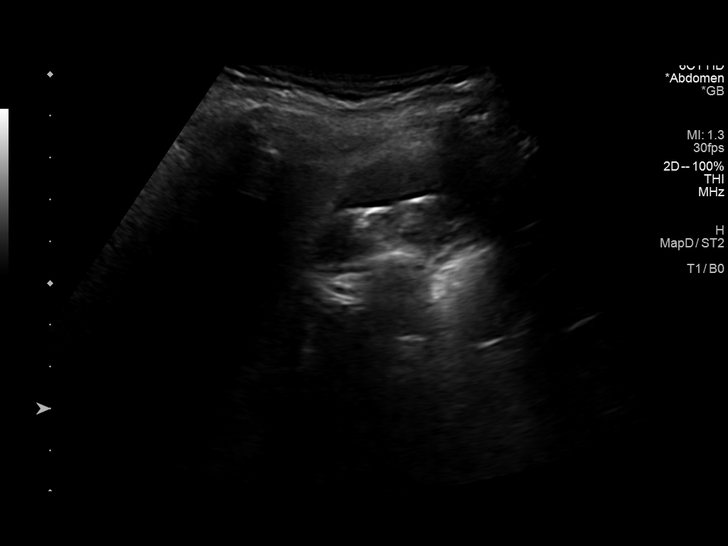

[14 of 25 positions shown; findings below may reference images not displayed]

FINDINGS: Gallbladder:

Moderate cholelithiasis and sludge with largest stone measuring
cm. No gallbladder wall thickening or adjacent free fluid. Negative
sonographic Murphy sign.

Common bile duct:

Diameter: 2.5 mm.

Liver:

Increased parenchymal echogenicity compatible with a degree of
steatosis. No focal mass. Left lobe not well visualized portal vein
is patent on color Doppler imaging with normal direction of blood
flow towards the liver.

Other: None.
IMPRESSION: 1. Moderate cholelithiasis and sludge. No additional sonographic
evidence to suggest acute cholecystitis.

2.  Mild hepatic steatosis.

## 2021-09-10 ENCOUNTER — Other Ambulatory Visit: Payer: Medicare Other

## 2021-09-17 ENCOUNTER — Other Ambulatory Visit: Payer: Medicare Other | Admitting: *Deleted

## 2021-09-17 ENCOUNTER — Other Ambulatory Visit: Payer: Self-pay

## 2021-09-17 DIAGNOSIS — I2510 Atherosclerotic heart disease of native coronary artery without angina pectoris: Secondary | ICD-10-CM

## 2021-09-17 LAB — LIPID PANEL
Chol/HDL Ratio: 3.4 ratio (ref 0.0–5.0)
Cholesterol, Total: 137 mg/dL (ref 100–199)
HDL: 40 mg/dL (ref >39)
LDL Chol Calc (NIH): 84 mg/dL (ref 0–99)
Triglycerides: 62 mg/dL (ref 0–149)
VLDL Cholesterol Cal: 13 mg/dL (ref 5–40)

## 2021-09-28 ENCOUNTER — Telehealth: Payer: Self-pay | Admitting: Physician Assistant

## 2021-09-28 NOTE — Telephone Encounter (Signed)
LDL fairly close to goal on high dose statin Rx. He has a hx of fatty liver. Let's continue Atorvastatin 40 mg once daily. Repeat fasting Lipids in 6 months. Continue to work on diet. Richardson Dopp, PA-C    09/28/2021 1:23 PM

## 2021-09-28 NOTE — Telephone Encounter (Signed)
Follow Up:      Patient says he is returning a call from last week, concerning his lab results.

## 2021-09-28 NOTE — Telephone Encounter (Signed)
Per pt is taking Atorvastatin 40 mg daily . LDL above goal.  PLAN:  - If he is not already taking Atorvastatin 40 mg, increase Atorvastatin to 40 mg once daily and repeat fasting Lipids and LFTs in 3 mos.   Richardson Dopp, PA-C    09/19/2021 9:38 PM      Patient Communication

## 2021-09-29 NOTE — Telephone Encounter (Signed)
Pt has upcoming appt in March.  Lipid/lft added to appt notes to set up in 5 months.

## 2021-11-08 ENCOUNTER — Telehealth: Payer: Self-pay | Admitting: Family Medicine

## 2021-11-08 NOTE — Telephone Encounter (Signed)
LVM for pt to rtn my call to schedule AWV with [facility]. Please schedule this appt if pt calls the office.  °

## 2021-11-09 ENCOUNTER — Other Ambulatory Visit: Payer: Self-pay

## 2021-11-09 ENCOUNTER — Ambulatory Visit (INDEPENDENT_AMBULATORY_CARE_PROVIDER_SITE_OTHER): Payer: Medicare Other | Admitting: Physician Assistant

## 2021-11-09 ENCOUNTER — Encounter: Payer: Self-pay | Admitting: Physician Assistant

## 2021-11-09 VITALS — BP 108/70 | HR 62 | Ht 69.0 in | Wt 203.8 lb

## 2021-11-09 DIAGNOSIS — I10 Essential (primary) hypertension: Secondary | ICD-10-CM | POA: Diagnosis not present

## 2021-11-09 DIAGNOSIS — I7121 Aneurysm of the ascending aorta, without rupture: Secondary | ICD-10-CM

## 2021-11-09 DIAGNOSIS — I480 Paroxysmal atrial fibrillation: Secondary | ICD-10-CM | POA: Diagnosis not present

## 2021-11-09 DIAGNOSIS — E785 Hyperlipidemia, unspecified: Secondary | ICD-10-CM | POA: Diagnosis not present

## 2021-11-09 DIAGNOSIS — I700 Atherosclerosis of aorta: Secondary | ICD-10-CM

## 2021-11-09 DIAGNOSIS — I2510 Atherosclerotic heart disease of native coronary artery without angina pectoris: Secondary | ICD-10-CM

## 2021-11-09 DIAGNOSIS — I491 Atrial premature depolarization: Secondary | ICD-10-CM

## 2021-11-09 NOTE — Assessment & Plan Note (Signed)
His symptoms are well controlled on beta-blocker Rx.  ?

## 2021-11-09 NOTE — Progress Notes (Signed)
?Cardiology Office Note:   ? ?Date:  11/09/2021  ? ?ID:  Howard [name], DOB [birth year], MRN [id number] ? ?PCP:  Tonia Ghent, MD  ?[facility] Providers ?Cardiologist:  Candee Furbish, MD ?Cardiology APP:  Liliane Shi, PA-C     ?Referring MD: Tonia Ghent, MD  ? ?Chief Complaint:  F/u thoracic aortic aneurysm, CAD ?  ? ?Patient Profile: ?Hypertension  ?Thoracic aortic aneurysm ?CT Jul 21: 4.2 cm  ?CT 8/22: 4 cm ?Coronary Ca2+ ?CT 6/19 ?Myoview in 2019: low risk ?Atrial fibrillation - noted on stress test in 2019 ?Monitor: NSR, freq PACs, brief atrial runs ?Aortic atherosclerosis  ?Hepatic steatosis (? LFTs) ?  ?  ?Prior CV studies: ?Chest/aorta CTA 03/19/2021 ?Ascending thoracic aorta 4 cm ? ?Chest CTA 03/06/20 ?Ascending thoracic aortic aneurysm 4.2 cm. ?  ?Echocardiogram 02/20/20 ?EF 60-65, no RWMA, mild LVH, GR 1 DD, normal RVSF, trivial MR, mild dilation of aortic root and ascending aorta (41 mm) ?  ?VAS US CAROTID DUPLEX BILATERAL 04/16/2019 ?Bilateral Carotids: There was no evidence of thrombus, dissection, atherosclerotic plaque or stenosis in the cervical carotid system. ?  ?Event monitor 02/22/2018 ?Predominantly sinus rhythm (avg HR 76) with frequent PAC's and brief atrial runs, rare PVCs ?  ?Nuclear stress test 02/16/2018 ?No significant ischemia, EF 50; paroxysmal atrial fibrillation noted after administration of Lexiscan, but spontaneously resolved. ?  ?ABIs 08/11/2017 ?Normal ? ?History of Present Illness:   ?Howard [name] is a 71 y.o. male with the above problem list.  He was last seen in May 2022.  He returns for f/u.  He is here alone.  He is doing well.  He recently had some MSK pain in his chest after moving some gravel.  Otherwise, he has not had chest pain, shortness of breath, syncope, orthopnea, leg edema, palpitations.  ?   ?Past Medical History:  ?Diagnosis Date  ? Anesthesia complication   ? prolonged sedation after colonoscopy  ? Aortic atherosclerosis ([place])   ? CT 02/2020  ?  Colon polyps   ? on colonoscopy 08/2010  ? ED (erectile dysfunction)   ? Essential hypertension 06/25/2008  ? Qualifier: Diagnosis of  By: Council Mechanic MD, Hilaria Ota   ? GERD (gastroesophageal reflux disease)   ? HYPERLIPIDEMIA 07/24/2008  ? Qualifier: Diagnosis of  By: Council Mechanic MD, Hilaria Ota   ? Hypersomnia 01/05/2017  ? Near syncope 03/14/2017  ? Neck pain 12/15/2011  ? OSA (obstructive sleep apnea) 11/13/2016  ? Dr. Elsworth Soho  ? Prostate cancer [facility])   ? s/p prostatectomy  ? Sleep apnea 2018  ? Thoracic ascending aortic aneurysm 04/17/2017  ? Chest CTA 10/18: Aorta 4.1 cm// Echo 8/18:  - 4.3 cm. Aortic arch is 3.3 cm // CT 01/2019: aorta 3.8 cm; sinuses of Valsalva 4.5 cm // Chest CTA 7/21: TAA 4.2 cm, cholelithiasis, aortic atherosclerosis // Chest CTA 8/22: Ascending thoracic aorta 4 cm, coronary calcification, cholelithiasis, aortic atherosclerosis//Chest CTA 8/22: Ascending thoracic aorta 4 cm  ? ?Current Medications: ?Current Meds  ?Medication Sig  ? aspirin EC 81 MG tablet Take 1 tablet (81 mg total) by mouth daily. Swallow whole.  ? atorvastatin (LIPITOR) 40 MG tablet Take 1 tablet (40 mg total) by mouth daily.  ? Cholecalciferol 125 MCG (5000 UT) capsule Take 5,000 Units by mouth daily.  ? metoprolol succinate (TOPROL-XL) 25 MG 24 hr tablet Take 1 tablet (25 mg total) by mouth daily.  ? Omeprazole-Sodium Bicarbonate (ZEGERID) 20-1100 MG CAPS capsule Take 1 capsule by mouth daily  before breakfast.  ?  ?Allergies:   Ibuprofen  ? ?Social History  ? ?Tobacco Use  ? Smoking status: Never  ? Smokeless tobacco: Never  ?Vaping Use  ? Vaping Use: Never used  ?Substance Use Topics  ? Alcohol use: No  ?  Alcohol/week: 0.0 standard drinks  ?  Comment: no  ? Drug use: No  ?  ?Family Hx: ?The patient's family history includes Stroke in his mother. There is no history of Prostate cancer, Colon cancer, Aortic dissection, Esophageal cancer, Colon polyps, Rectal cancer, or Stomach cancer. ? ?Review of Systems  ?Cardiovascular:   Negative for claudication.   ? ?EKGs/Labs/Other Test Reviewed:   ? ?EKG:  EKG is not ordered today.  The ekg ordered today demonstrates n/a ? ?Recent Labs: ?05/26/2021: ALT 41; BUN 14; Creatinine, Ser 1.27; Hemoglobin 14.9; Platelets 241.0; Potassium 4.3; Sodium 137  ? ?Recent Lipid Panel ?Recent Labs  ?  09/17/21 ?0807  ?CHOL 137  ?TRIG 62  ?HDL 40  ?[place] 84  ?  ? ?Risk Assessment/Calculations:   ? ?CHA2DS2-VASc Score = 3  ? This indicates a 3.2% annual risk of stroke. ?The patient's score is based upon: ?CHF History: 0 ?HTN History: 1 ?Diabetes History: 0 ?Stroke History: 0 ?Vascular Disease History: 1 ?Age Score: 1 ?Gender Score: 0 ?  ? ?    ?Physical Exam:   ? ?VS:  BP 108/70 (BP Location: Right Arm, Patient Position: Sitting, Cuff Size: Normal)   Pulse 62   Ht '5\' 9"'$  (1.753 m)   Wt 203 lb 12.8 oz (92.4 kg)   SpO2 96%   BMI 30.10 kg/m?    ? ?Wt Readings from Last 3 Encounters:  ?11/09/21 203 lb 12.8 oz (92.4 kg)  ?05/26/21 204 lb (92.5 kg)  ?04/05/21 205 lb (93 kg)  ?  ?Constitutional:   ?   Appearance: Healthy appearance. Not in distress.  ?Neck:  ?   Vascular: No carotid bruit or JVR. JVD normal.  ?Pulmonary:  ?   Effort: Pulmonary effort is normal.  ?   Breath sounds: No wheezing. No rales.  ?Cardiovascular:  ?   Normal rate. Regular rhythm. Normal S1. Normal S2.   ?   Murmurs: There is no murmur.  ?Edema: ?   Peripheral edema absent.  ?Abdominal:  ?   Palpations: Abdomen is soft. There is no hepatomegaly.  ?Skin: ?   General: Skin is warm and dry.  ?Neurological:  ?   General: No focal deficit present.  ?   Mental Status: Alert and oriented to person, place and time.  ?   Cranial Nerves: Cranial nerves are intact.  ?  ?    ?ASSESSMENT & PLAN:   ?Aortic atherosclerosis ([place]) ?Continue ASA, statin Rx.  ? ?Coronary artery calcification seen on CT scan ?No anginal symptoms. Continue ASA 81 mg once daily, Lipitor 40 mg once daily.  ? ?Essential hypertension ?BP well controlled. Continue Toprol XL 25 mg  once daily.  ? ?Hyperlipidemia LDL goal <70 ?LDL in Feb 2023 was 84.  Continue current dose of Lipitor. He has seen GI and cleared to remain on statin Rx.  Repeat CMET, Lipids prior to next OV in 1 year.  If LDL remains > 70, consider changing to Crestor 20 mg once daily. ? ?Paroxysmal atrial fibrillation ([place]) ?Noted on stress test in 2019.  Currently maintaining normal sinus rhythm on exam. If he has recurrent atrial fibrillation, he will need anticoagulation. ? ?Thoracic ascending aortic aneurysm ([place]) ?CT in Aug  22 with TAA measuring 4 cm.  Continue good BP control.  F/u CT planned in Aug 23.  ? ?Premature atrial beats ?His symptoms are well controlled on beta-blocker Rx.  ? ?     ?   ?Dispo:  Return in about 1 year (around 11/10/2022) for Routine Follow Up w Dr. Marlou Porch, or Richardson Dopp, PA-C.  ? ?Medication Adjustments/Labs and Tests Ordered: ?Current medicines are reviewed at length with the patient today.  Concerns regarding medicines are outlined above.  ?Tests Ordered: ?Orders Placed This Encounter  ?Procedures  ? Lipid panel  ? Hepatic function panel  ? ?Medication Changes: ?No orders of the defined types were placed in this encounter. ? ?Signed, ?Richardson Dopp, PA-C  ?11/09/2021 3:43 PM    ?[place] ?[address], [place], [place]  [id number] ?Phone: [phone]; Fax: [phone]  ?

## 2021-11-09 NOTE — Assessment & Plan Note (Signed)
LDL in Feb 2023 was 84.  Continue current dose of Lipitor. He has seen GI and cleared to remain on statin Rx.  Repeat CMET, Lipids prior to next OV in 1 year.  If LDL remains > 70, consider changing to Crestor 20 mg once daily. ?

## 2021-11-09 NOTE — Assessment & Plan Note (Signed)
No anginal symptoms. Continue ASA 81 mg once daily, Lipitor 40 mg once daily.  ?

## 2021-11-09 NOTE — Assessment & Plan Note (Signed)
Noted on stress test in 2019.  Currently maintaining normal sinus rhythm on exam. If he has recurrent atrial fibrillation, he will need anticoagulation. ?

## 2021-11-09 NOTE — Assessment & Plan Note (Signed)
BP well controlled. Continue Toprol XL 25 mg once daily.  ?

## 2021-11-09 NOTE — Assessment & Plan Note (Signed)
CT in Aug 22 with TAA measuring 4 cm.  Continue good BP control.  F/u CT planned in Aug 23.  ?

## 2021-11-09 NOTE — Assessment & Plan Note (Signed)
Continue ASA, statin Rx.  

## 2021-11-09 NOTE — Patient Instructions (Addendum)
Medication Instructions:  ?Your physician recommends that you continue on your current medications as directed. Please refer to the Current Medication list given to you today. ? ?*If you need a refill on your cardiac medications before your next appointment, please call your pharmacy* ? ? ?Lab Work: ?IN 1 YEAR (1 week prior to your yearly appt)  FASTING LIPID & CMET ? ?If you have labs (blood work) drawn today and your tests are completely normal, you will receive your results only by: ?MyChart Message (if you have MyChart) OR ?A paper copy in the mail ?If you have any lab test that is abnormal or we need to change your treatment, we will call you to review the results. ? ? ?Testing/Procedures: ?None ordered ? ? ?Follow-Up: ?At [facility], you and your health needs are our priority.  As part of our continuing mission to provide you with exceptional heart care, we have created designated Provider Care Teams.  These Care Teams include your primary Cardiologist (physician) and Advanced Practice Providers (APPs -  Physician Assistants and Nurse Practitioners) who all work together to provide you with the care you need, when you need it. ? ?We recommend signing up for the patient portal called "MyChart".  Sign up information is provided on this After Visit Summary.  MyChart is used to connect with patients for Virtual Visits (Telemedicine).  Patients are able to view lab/test results, encounter notes, upcoming appointments, etc.  Non-urgent messages can be sent to your provider as well.   ?To learn more about what you can do with MyChart, go to [email or web address].   ? ?Your next appointment:   ?12 month(s) ? ?The format for your next appointment:   ?In Person ? ?Provider:   ?Candee Furbish, MD  or Richardson Dopp, PA-C       ? ? ?Other Instructions ? ?

## 2021-11-12 ENCOUNTER — Other Ambulatory Visit: Payer: Self-pay | Admitting: Family Medicine

## 2021-11-12 NOTE — Telephone Encounter (Signed)
LMTCB to schedule AWV part 2 with Dr. Damita Dunnings; AWV is already scheduled with health advisor nurse for 11/17/21 ?

## 2021-11-17 ENCOUNTER — Ambulatory Visit (INDEPENDENT_AMBULATORY_CARE_PROVIDER_SITE_OTHER): Payer: Medicare Other

## 2021-11-17 VITALS — Ht 69.0 in | Wt 203.0 lb

## 2021-11-17 DIAGNOSIS — Z0000 Encounter for general adult medical examination without abnormal findings: Secondary | ICD-10-CM | POA: Diagnosis not present

## 2021-11-17 NOTE — Progress Notes (Signed)
? ?Subjective:  ? Howard [name] is a 71 y.o. male who presents for Medicare Annual/Subsequent preventive examination. ? ?I connected with Howard [name] today by telephone and verified that I am speaking with the correct person using two identifiers. ?Location patient: home ?Location provider: work ?Persons participating in the virtual visit: patient, nurse.  ?  ?I discussed the limitations, risks, security and privacy concerns of performing an evaluation and management service by telephone and the availability of in person appointments. I also discussed with the patient that there may be a patient responsible charge related to this service. The patient expressed understanding and verbally consented to this telephonic visit.  ?  ?Interactive audio and video telecommunications were attempted between this provider and patient, however failed, due to patient having technical difficulties OR patient did not have access to video capability.  We continued and completed visit with audio only. ? ?Some vital signs may be absent or patient reported.  ? ?Time Spent with patient on telephone encounter: 20 minutes ? ?Review of Systems    ? ?Cardiac Risk Factors include: advanced age (>75mn, >>41women);hypertension;dyslipidemia ? ?   ?Objective:  ?  ?Today's Vitals  ? 11/17/21 1317  ?Weight: 203 lb (92.1 kg)  ?Height: '5\' 9"'$  (1.753 m)  ? ?Body mass index is 29.98 kg/m?. ? ? ?  11/17/2021  ?  1:19 PM 06/20/2019  ?  9:12 AM 05/19/2019  ?  8:19 AM 05/10/2019  ?  2:35 PM 11/30/2017  ? 10:44 AM 04/04/2017  ?  9:08 PM 11/25/2016  ?  1:12 PM  ?Advanced Directives  ?Does Patient Have a Medical Advance Directive? No No No No No No No  ?Would patient like information on creating a medical advance directive?  No - Patient declined No - Patient declined  No - Patient declined No - Patient declined No - Patient declined  ? ? ?Current Medications (verified) ?Outpatient Encounter Medications as of 11/17/2021  ?Medication Sig  ? aspirin EC 81 MG tablet  Take 1 tablet (81 mg total) by mouth daily. Swallow whole.  ? atorvastatin (LIPITOR) 40 MG tablet Take 1 tablet (40 mg total) by mouth daily.  ? Cholecalciferol 125 MCG (5000 UT) capsule Take 5,000 Units by mouth daily.  ? metoprolol succinate (TOPROL-XL) 25 MG 24 hr tablet TAKE 1 TABLET (25 MG TOTAL) BY MOUTH DAILY.  ? Omeprazole-Sodium Bicarbonate (ZEGERID) 20-1100 MG CAPS capsule Take 1 capsule by mouth daily before breakfast.  ? ?No facility-administered encounter medications on file as of 11/17/2021.  ? ? ?Allergies (verified) ?Ibuprofen  ? ?History: ?Past Medical History:  ?Diagnosis Date  ? Anesthesia complication   ? prolonged sedation after colonoscopy  ? Aortic atherosclerosis ([facility]   ? CT 02/2020  ? Colon polyps   ? on colonoscopy 08/2010  ? ED (erectile dysfunction)   ? Essential hypertension 06/25/2008  ? Qualifier: Diagnosis of  By: SCouncil MechanicMD, RHilaria Ota  ? GERD (gastroesophageal reflux disease)   ? HYPERLIPIDEMIA 07/24/2008  ? Qualifier: Diagnosis of  By: SCouncil MechanicMD, RHilaria Ota  ? Hypersomnia 01/05/2017  ? Near syncope 03/14/2017  ? Neck pain 12/15/2011  ? OSA (obstructive sleep apnea) 11/13/2016  ? Dr. AElsworth Soho ? Prostate cancer ([facility]   ? s/p prostatectomy  ? Sleep apnea 2018  ? Thoracic ascending aortic aneurysm ([facility] 04/17/2017  ? Chest CTA 10/18: Aorta 4.1 cm// Echo 8/18:  - 4.3 cm. Aortic arch is 3.3 cm // CT 01/2019: aorta 3.8 cm; sinuses of Valsalva  4.5 cm // Chest CTA 7/21: TAA 4.2 cm, cholelithiasis, aortic atherosclerosis // Chest CTA 8/22: Ascending thoracic aorta 4 cm, coronary calcification, cholelithiasis, aortic atherosclerosis//Chest CTA 8/22: Ascending thoracic aorta 4 cm  ? ?Past Surgical History:  ?Procedure Laterality Date  ? COLONOSCOPY    ? ESOPHAGOGASTRODUODENOSCOPY  10/02/2000  ? dilated reflux esopsh hh  ? [facility]: Dehydration; vasvagal sync  12/09/ 05/2003  ? PLANTAR FASCIA SURGERY Bilateral   ? POLYPECTOMY    ? ROBOT ASSISTED LAPAROSCOPIC RADICAL PROSTATECTOMY N/A 05/27/2013  ?  Procedure: ROBOTIC ASSISTED LAPAROSCOPIC RADICAL PROSTATECTOMY LEVEL 1;  Surgeon: Dutch Gray, MD;  Location: [facility];  Service: Urology;  Laterality: N/A;  ? UPPER GASTROINTESTINAL ENDOSCOPY    ? ?Family History  ?Problem Relation Age of Onset  ? Stroke Mother   ? Prostate cancer Neg Hx   ? Colon cancer Neg Hx   ? Aortic dissection Neg Hx   ? Esophageal cancer Neg Hx   ? Colon polyps Neg Hx   ? Rectal cancer Neg Hx   ? Stomach cancer Neg Hx   ? ?Social History  ? ?Socioeconomic History  ? Marital status: Married  ?  Spouse name: Not on file  ? Number of children: 3  ? Years of education: 52  ? Highest education level: Not on file  ?Occupational History  ? Occupation: Retired  ?  Comment: [employer]  ?Tobacco Use  ? Smoking status: Never  ? Smokeless tobacco: Never  ?Vaping Use  ? Vaping Use: Never used  ?Substance and Sexual Activity  ? Alcohol use: No  ?  Alcohol/week: 0.0 standard drinks  ?  Comment: no  ? Drug use: No  ? Sexual activity: Not on file  ?Other Topics Concern  ? Not on file  ?Social History Narrative  ? Retired [employer], parttime bailiff- laid off as of 2018.   ? 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    ? Married 1976, lives with wife. 1 step daughter, 2 daughters and 1 son.  ? [employer]  ? Left handed  ? ?Social Determinants of Health  ? ?Financial Resource Strain: Low Risk   ? Difficulty of Paying Living Expenses: Not hard at all  ?Food Insecurity: No Food Insecurity  ? Worried About Charity fundraiser in the Last Year: Never true  ? Ran Out of Food in the Last Year: Never true  ?Transportation Needs: No Transportation Needs  ? Lack of Transportation (Medical): No  ? Lack of Transportation (Non-Medical): No  ?Physical Activity: Insufficiently Active  ? Days of Exercise per Week: 3 days  ? Minutes of Exercise per Session: 40 min  ?Stress: No Stress Concern Present  ? Feeling of Stress : Not at all  ?Social Connections: Moderately Integrated  ? Frequency of Communication with  Friends and Family: More than three times a week  ? Frequency of Social Gatherings with Friends and Family: More than three times a week  ? Attends Religious Services: More than 4 times per year  ? Active Member of Clubs or Organizations: No  ? Attends Archivist Meetings: Never  ? Marital Status: Married  ? ? ?Tobacco Counseling ?Counseling given: Not Answered ? ? ?Clinical Intake: ? ?Pre-visit preparation completed: Yes ? ?Pain : No/denies pain ? ?  ? ?BMI - recorded: 29.98 ?Nutritional Status: BMI 25 -29 Overweight ?Nutritional Risks: None ?Diabetes: No ? ?How often do you need to have someone help you when you read instructions, pamphlets, or  other written materials from your doctor or pharmacy?: 1 - Never ? ?Diabetic?No ? ?Interpreter Needed?: No ? ?Information entered by :: Orrin Brigham LPN ? ? ?Activities of Daily Living ? ?  11/17/2021  ?  1:21 PM  ?In your present state of health, do you have any difficulty performing the following activities:  ?Hearing? 0  ?Vision? 0  ?Difficulty concentrating or making decisions? 0  ?Walking or climbing stairs? 0  ?Dressing or bathing? 0  ?Doing errands, shopping? 0  ?Preparing Food and eating ? N  ?Using the Toilet? N  ?In the past six months, have you accidently leaked urine? N  ?Do you have problems with loss of bowel control? N  ?Managing your Medications? N  ?Managing your Finances? N  ?Housekeeping or managing your Housekeeping? N  ? ? ?Patient Care Team: ?Tonia Ghent, MD as PCP - General (Family Medicine) ?Jerline Pain, MD as PCP - Cardiology (Cardiology) ?Alda Berthold, DO as Consulting Physician (Neurology) ?Sharmon Revere as Physician Assistant (Cardiology) ? ?Indicate any recent Medical Services you may have received from other than [facility] providers in the past year (date may be approximate). ? ?   ?Assessment:  ? This is a routine wellness examination for Howard [name]. ? ?Hearing/Vision screen ?Hearing Screening - Comments:: No issues ?Vision  Screening - Comments:: Last exam 12/2020, [facility], wears readers ? ?Dietary issues and exercise activities discussed: ?Current Exercise Habits: Home exercise routine, Type of exercise: treadmill;st

## 2021-11-17 NOTE — Patient Instructions (Signed)
Howard [name] , ?Thank you for taking time to complete your Medicare Wellness Visit. I appreciate your ongoing commitment to your health goals. Please review the following plan we discussed and let me know if I can assist you in the future.  ? ?Screening recommendations/referrals: ?Colonoscopy:  up to date, due 06/20/22 ?ophthalmology/optometry visit : up to date ?Recommended yearly dental visit for hygiene and checkup ? ?Vaccinations: ?Influenza vaccine: Due-May obtain vaccine at our office or your local pharmacy. ?Pneumococcal vaccine: up to date ?Tdap vaccine: due 06/25/18, medicare may cover in the event that you are cut or injured ?Shingles vaccine: Discuss with pharmacy   ?Covid-19: newest booster available at your local pharmacy ? ?Advanced directives: information available at your next appointment ? ?Conditions/risks identified: see problem list ? ?Next appointment: Follow up in one year for your annual wellness visit.  ? ?Preventive Care 72 Years and Older, Male ?Preventive care refers to lifestyle choices and visits with your health care provider that can promote health and wellness. ?What does preventive care include? ?A yearly physical exam. This is also called an annual well check. ?Dental exams once or twice a year. ?Routine eye exams. Ask your health care provider how often you should have your eyes checked. ?Personal lifestyle choices, including: ?Daily care of your teeth and gums. ?Regular physical activity. ?Eating a healthy diet. ?Avoiding tobacco and drug use. ?Limiting alcohol use. ?Practicing safe sex. ?Taking low doses of aspirin every day. ?Taking vitamin and mineral supplements as recommended by your health care provider. ?What happens during an annual well check? ?The services and screenings done by your health care provider during your annual well check will depend on your age, overall health, lifestyle risk factors, and family history of disease. ?Counseling  ?Your health care provider may ask  you questions about your: ?Alcohol use. ?Tobacco use. ?Drug use. ?Emotional well-being. ?Home and relationship well-being. ?Sexual activity. ?Eating habits. ?History of falls. ?Memory and ability to understand (cognition). ?Work and work Statistician. ?Screening  ?You may have the following tests or measurements: ?Height, weight, and BMI. ?Blood pressure. ?Lipid and cholesterol levels. These may be checked every 5 years, or more frequently if you are over 78 years old. ?Skin check. ?Lung cancer screening. You may have this screening every year starting at age 59 if you have a 30-pack-year history of smoking and currently smoke or have quit within the past 15 years. ?Fecal occult blood test (FOBT) of the stool. You may have this test every year starting at age 44. ?Flexible sigmoidoscopy or colonoscopy. You may have a sigmoidoscopy every 5 years or a colonoscopy every 10 years starting at age 35. ?Prostate cancer screening. Recommendations will vary depending on your family history and other risks. ?Hepatitis C blood test. ?Hepatitis B blood test. ?Sexually transmitted disease (STD) testing. ?Diabetes screening. This is done by checking your blood sugar (glucose) after you have not eaten for a while (fasting). You may have this done every 1-3 years. ?Abdominal aortic aneurysm (AAA) screening. You may need this if you are a current or former smoker. ?Osteoporosis. You may be screened starting at age 73 if you are at high risk. ?Talk with your health care provider about your test results, treatment options, and if necessary, the need for more tests. ?Vaccines  ?Your health care provider may recommend certain vaccines, such as: ?Influenza vaccine. This is recommended every year. ?Tetanus, diphtheria, and acellular pertussis (Tdap, Td) vaccine. You may need a Td booster every 10 years. ?Zoster vaccine. You may  need this after age 5. ?Pneumococcal 13-valent conjugate (PCV13) vaccine. One dose is recommended after age  73. ?Pneumococcal polysaccharide (PPSV23) vaccine. One dose is recommended after age 39. ?Talk to your health care provider about which screenings and vaccines you need and how often you need them. ?This information is not intended to replace advice given to you by your health care provider. Make sure you discuss any questions you have with your health care provider. ?Document Released: 08/28/2015 Document Revised: 04/20/2016 Document Reviewed: 06/02/2015 ?Elsevier Interactive Patient Education ? 2017 [place]. ? ?Fall Prevention in the Home ?Falls can cause injuries. They can happen to people of all ages. There are many things you can do to make your home safe and to help prevent falls. ?What can I do on the outside of my home? ?Regularly fix the edges of walkways and driveways and fix any cracks. ?Remove anything that might make you trip as you walk through a door, such as a raised step or threshold. ?Trim any bushes or trees on the path to your home. ?Use bright outdoor lighting. ?Clear any walking paths of anything that might make someone trip, such as rocks or tools. ?Regularly check to see if handrails are loose or broken. Make sure that both sides of any steps have handrails. ?Any raised decks and porches should have guardrails on the edges. ?Have any leaves, snow, or ice cleared regularly. ?Use sand or salt on walking paths during winter. ?Clean up any spills in your garage right away. This includes oil or grease spills. ?What can I do in the bathroom? ?Use night lights. ?Install grab bars by the toilet and in the tub and shower. Do not use towel bars as grab bars. ?Use non-skid mats or decals in the tub or shower. ?If you need to sit down in the shower, use a plastic, non-slip stool. ?Keep the floor dry. Clean up any water that spills on the floor as soon as it happens. ?Remove soap buildup in the tub or shower regularly. ?Attach bath mats securely with double-sided non-slip rug tape. ?Do not have throw  rugs and other things on the floor that can make you trip. ?What can I do in the bedroom? ?Use night lights. ?Make sure that you have a light by your bed that is easy to reach. ?Do not use any sheets or blankets that are too big for your bed. They should not hang down onto the floor. ?Have a firm chair that has side arms. You can use this for support while you get dressed. ?Do not have throw rugs and other things on the floor that can make you trip. ?What can I do in the kitchen? ?Clean up any spills right away. ?Avoid walking on wet floors. ?Keep items that you use a lot in easy-to-reach places. ?If you need to reach something above you, use a strong step stool that has a grab bar. ?Keep electrical cords out of the way. ?Do not use floor polish or wax that makes floors slippery. If you must use wax, use non-skid floor wax. ?Do not have throw rugs and other things on the floor that can make you trip. ?What can I do with my stairs? ?Do not leave any items on the stairs. ?Make sure that there are handrails on both sides of the stairs and use them. Fix handrails that are broken or loose. Make sure that handrails are as long as the stairways. ?Check any carpeting to make sure that it is firmly attached  to the stairs. Fix any carpet that is loose or worn. ?Avoid having throw rugs at the top or bottom of the stairs. If you do have throw rugs, attach them to the floor with carpet tape. ?Make sure that you have a light switch at the top of the stairs and the bottom of the stairs. If you do not have them, ask someone to add them for you. ?What else can I do to help prevent falls? ?Wear shoes that: ?Do not have high heels. ?Have rubber bottoms. ?Are comfortable and fit you well. ?Are closed at the toe. Do not wear sandals. ?If you use a stepladder: ?Make sure that it is fully opened. Do not climb a closed stepladder. ?Make sure that both sides of the stepladder are locked into place. ?Ask someone to hold it for you, if  possible. ?Clearly mark and make sure that you can see: ?Any grab bars or handrails. ?First and last steps. ?Where the edge of each step is. ?Use tools that help you move around (mobility aids) if they are ne

## 2021-12-09 ENCOUNTER — Encounter: Payer: Self-pay | Admitting: Family Medicine

## 2021-12-09 ENCOUNTER — Ambulatory Visit (INDEPENDENT_AMBULATORY_CARE_PROVIDER_SITE_OTHER): Payer: Medicare Other | Admitting: Family Medicine

## 2021-12-09 VITALS — BP 128/78 | HR 60 | Temp 97.4°F | Ht 69.0 in | Wt 204.0 lb

## 2021-12-09 DIAGNOSIS — K219 Gastro-esophageal reflux disease without esophagitis: Secondary | ICD-10-CM | POA: Diagnosis not present

## 2021-12-09 DIAGNOSIS — Z0000 Encounter for general adult medical examination without abnormal findings: Secondary | ICD-10-CM

## 2021-12-09 DIAGNOSIS — K760 Fatty (change of) liver, not elsewhere classified: Secondary | ICD-10-CM | POA: Diagnosis not present

## 2021-12-09 DIAGNOSIS — Z125 Encounter for screening for malignant neoplasm of prostate: Secondary | ICD-10-CM | POA: Diagnosis not present

## 2021-12-09 DIAGNOSIS — I10 Essential (primary) hypertension: Secondary | ICD-10-CM

## 2021-12-09 DIAGNOSIS — G4733 Obstructive sleep apnea (adult) (pediatric): Secondary | ICD-10-CM

## 2021-12-09 DIAGNOSIS — E785 Hyperlipidemia, unspecified: Secondary | ICD-10-CM | POA: Diagnosis not present

## 2021-12-09 DIAGNOSIS — E559 Vitamin D deficiency, unspecified: Secondary | ICD-10-CM

## 2021-12-09 DIAGNOSIS — Z7189 Other specified counseling: Secondary | ICD-10-CM

## 2021-12-09 LAB — COMPREHENSIVE METABOLIC PANEL
ALT: 22 U/L (ref 0–53)
AST: 37 U/L (ref 0–37)
Albumin: 3.9 g/dL (ref 3.5–5.2)
Alkaline Phosphatase: 113 U/L (ref 39–117)
BUN: 17 mg/dL (ref 6–23)
CO2: 29 mEq/L (ref 19–32)
Calcium: 9.5 mg/dL (ref 8.4–10.5)
Chloride: 104 mEq/L (ref 96–112)
Creatinine, Ser: 1.25 mg/dL (ref 0.40–1.50)
GFR: 57.75 mL/min — ABNORMAL LOW (ref >60.00)
Glucose, Bld: 116 mg/dL — ABNORMAL HIGH (ref 70–99)
Potassium: 4.1 mEq/L (ref 3.5–5.1)
Sodium: 138 mEq/L (ref 135–145)
Total Bilirubin: 0.7 mg/dL (ref 0.2–1.2)
Total Protein: 6.6 g/dL (ref 6.0–8.3)

## 2021-12-09 LAB — LIPID PANEL
Cholesterol: 144 mg/dL (ref 0–200)
HDL: 44.6 mg/dL (ref >39.00)
LDL Cholesterol: 80 mg/dL (ref 0–99)
NonHDL: 99.69
Total CHOL/HDL Ratio: 3
Triglycerides: 98 mg/dL (ref 0.0–149.0)
VLDL: 19.6 mg/dL (ref 0.0–40.0)

## 2021-12-09 LAB — CBC WITH DIFFERENTIAL/PLATELET
Basophils Absolute: 0 10*3/uL (ref 0.0–0.1)
Basophils Relative: 0.4 % (ref 0.0–3.0)
Eosinophils Absolute: 0.3 10*3/uL (ref 0.0–0.7)
Eosinophils Relative: 3.8 % (ref 0.0–5.0)
HCT: 42.2 % (ref 39.0–52.0)
Hemoglobin: 14.1 g/dL (ref 13.0–17.0)
Lymphocytes Relative: 25 % (ref 12.0–46.0)
Lymphs Abs: 2 10*3/uL (ref 0.7–4.0)
MCHC: 33.5 g/dL (ref 30.0–36.0)
MCV: 90.2 fl (ref 78.0–100.0)
Monocytes Absolute: 0.9 10*3/uL (ref 0.1–1.0)
Monocytes Relative: 11.4 % (ref 3.0–12.0)
Neutro Abs: 4.7 10*3/uL (ref 1.4–7.7)
Neutrophils Relative %: 59.4 % (ref 43.0–77.0)
Platelets: 198 10*3/uL (ref 150.0–400.0)
RBC: 4.68 Mil/uL (ref 4.22–5.81)
RDW: 13.8 % (ref 11.5–15.5)
WBC: 7.9 10*3/uL (ref 4.0–10.5)

## 2021-12-09 LAB — PSA, MEDICARE: PSA: 0 ng/ml — ABNORMAL LOW (ref 0.10–4.00)

## 2021-12-09 LAB — VITAMIN D 25 HYDROXY (VIT D DEFICIENCY, FRACTURES): VITD: 45.07 ng/mL (ref 30.00–100.00)

## 2021-12-09 MED ORDER — METOPROLOL SUCCINATE ER 25 MG PO TB24: 25.0000 mg | ORAL_TABLET | Freq: Every day | ORAL | 3 refills | Status: DC

## 2021-12-09 NOTE — Progress Notes (Signed)
Elevated Cholesterol: ?Using medications without problems: yes ?Muscle aches: no ?Diet compliance: yes ?Exercise:yes, going to the gym.   ?Labs pending.  Not fasting.   ? ?H/o prostate cancer.  Recheck PSA pending.  He isn't leaking.   ? ?GERD.  Controlled with zegerid. No ADE on med.   ? ?Hypertension:   ?Using medication without problems or lightheadedness: yes ?Chest pain with exertion:no ?Edema:no ?Short of breath:no  ? ?Still using CPAP for OSA.  Sleeping well.  ? 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 You do not have advanced scarring (cirrhosis) in the liver but you do have some early scarring. This can be reduced by working to reduce the fat in your liver.  ?------------------------ ?D/w pt about hepatology eval.  LFTs pending.  He doesn't drink alcohol or use tylenol.   ? ?He has aorta monitoring per cardiology, d/w pt.   ? ?PSA pending 2023.   ?Colonoscopy 2020 ?Flu shot prev done.   ?PNA up to date.  ?Shingles and tetanus d/w pt.  ?Advance directive- wife designated if patient were incapacitated.   ?Diet and exercise d/w pt.   ? ?PMH and SH reviewed ? ?Meds, vitals, and allergies reviewed.  ? ?ROS: Per HPI unless specifically indicated in ROS section  ? ?GEN: nad, alert and oriented ?HEENT: ncat ?NECK: supple w/o LA ?CV: rrr. ?PULM: ctab, no inc wob ?ABD: soft, +bs ?EXT: no edema ?SKIN: well perfused.  ?

## 2021-12-09 NOTE — Patient Instructions (Signed)
Go to the lab on the way out.   If you have mychart we'll likely use that to update you.    Take care.  Glad to see you. 

## 2021-12-12 NOTE — Assessment & Plan Note (Signed)
Continue metoprolol.  Not fasting.  See notes on labs.  Continue work on diet and exercise. ?

## 2021-12-12 NOTE — Assessment & Plan Note (Signed)
Continue CPAP.  Sleeping well. ?

## 2021-12-12 NOTE — Assessment & Plan Note (Signed)
Advance directive- wife designated if patient were incapacitated.  

## 2021-12-12 NOTE — Assessment & Plan Note (Signed)
Labs pending.  Not fasting.  Continue Lipitor.  See notes on labs. ?

## 2021-12-12 NOTE — Assessment & Plan Note (Signed)
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 You do not have advanced scarring (cirrhosis) in the liver but you do have some early scarring. This can be reduced by working to reduce the fat in your liver.  ?------------------------ ?D/w pt about hepatology eval.  LFTs pending.  He doesn't drink alcohol or use tylenol.   See notes on labs. ?

## 2021-12-12 NOTE — Assessment & Plan Note (Addendum)
GERD.  Controlled with zegerid. No ADE on med.   I am going to check with pharmacy staff on patient's behalf to see if there are any cheaper options for Zegerid.  Continue Zegerid. ?

## 2021-12-12 NOTE — Assessment & Plan Note (Signed)
PSA pending 2023.   ?Colonoscopy 2020 ?Flu shot prev done.   ?PNA up to date.  ?Shingles and tetanus d/w pt.  ?Advance directive- wife designated if patient were incapacitated.   ?Diet and exercise d/w pt.   ?

## 2021-12-13 ENCOUNTER — Telehealth: Payer: Self-pay | Admitting: Pharmacist

## 2021-12-13 NOTE — Telephone Encounter (Signed)
Zegerid (omeprazole-bicarbonate) is available OTC, I checked on [facility] and it looks like they have the generic available for ~$15 for 42 tablets (it would be more expensive at [facility] - about $24). He can buy this OTC if he likes - this is probably the easiest option. ? ?The other option would be to split the medication into the 2 separate components - omeprazole 20 mg capsule plus sodium bicarb 650 mg tablet - and take them together. ?

## 2021-12-13 NOTE — Telephone Encounter (Signed)
-----   Message from Tonia Ghent, MD sent at 12/12/2021 10:24 PM EDT ----- ?Regarding: med cost help ?Are there any cheaper alternatives for Zegerid?  Many thanks. ? ?Brigitte Pulse ? ? ?

## 2021-12-14 NOTE — Telephone Encounter (Signed)
Message left with information on medication - instructed to return my call if any questions. ? ?Charlene Brooke, CPP notified ? ?Tephanie Escorcia, CCMA ?Health concierge  ?[phone]  ?

## 2021-12-15 NOTE — Progress Notes (Signed)
Patient returned my call and got information written down about generic medication for availabllity and cost , he will check into this . ? ?Charlene Brooke, CPP notified ? ?Marzell Isakson, CCMA ?Health concierge  ?[phone]  ?

## 2021-12-18 ENCOUNTER — Other Ambulatory Visit: Payer: Self-pay | Admitting: Physician Assistant

## 2022-04-07 ENCOUNTER — Other Ambulatory Visit: Payer: Self-pay | Admitting: Nurse Practitioner

## 2022-04-07 DIAGNOSIS — K7402 Hepatic fibrosis, advanced fibrosis: Secondary | ICD-10-CM

## 2022-04-14 ENCOUNTER — Ambulatory Visit
Admission: RE | Admit: 2022-04-14 | Discharge: 2022-04-14 | Disposition: A | Discharge status: Home / Self Care | Payer: Medicare Other | Source: Ambulatory Visit | Attending: Nurse Practitioner | Admitting: Nurse Practitioner

## 2022-04-14 DIAGNOSIS — K7402 Hepatic fibrosis, advanced fibrosis: Secondary | ICD-10-CM

## 2022-05-07 ENCOUNTER — Emergency Department (HOSPITAL_COMMUNITY): Payer: Medicare Other

## 2022-05-07 ENCOUNTER — Other Ambulatory Visit: Payer: Self-pay

## 2022-05-07 ENCOUNTER — Emergency Department (HOSPITAL_COMMUNITY)
Admission: EM | Admit: 2022-05-07 | Discharge: 2022-05-07 | Disposition: A | Discharge status: Home / Self Care | Payer: Medicare Other | Attending: Emergency Medicine | Admitting: Emergency Medicine

## 2022-05-07 ENCOUNTER — Encounter (HOSPITAL_COMMUNITY): Payer: Self-pay

## 2022-05-07 DIAGNOSIS — W010XXA Fall on same level from slipping, tripping and stumbling without subsequent striking against object, initial encounter: Secondary | ICD-10-CM | POA: Insufficient documentation

## 2022-05-07 DIAGNOSIS — I10 Essential (primary) hypertension: Secondary | ICD-10-CM | POA: Insufficient documentation

## 2022-05-07 DIAGNOSIS — Z79899 Other long term (current) drug therapy: Secondary | ICD-10-CM | POA: Insufficient documentation

## 2022-05-07 DIAGNOSIS — R0781 Pleurodynia: Secondary | ICD-10-CM | POA: Diagnosis present

## 2022-05-07 DIAGNOSIS — Z7982 Long term (current) use of aspirin: Secondary | ICD-10-CM | POA: Insufficient documentation

## 2022-05-07 DIAGNOSIS — W19XXXA Unspecified fall, initial encounter: Secondary | ICD-10-CM

## 2022-05-07 MED ORDER — OXYCODONE HCL 5 MG PO TABS: 5.0000 mg | ORAL_TABLET | Freq: Four times a day (QID) | ORAL | 0 refills | Status: AC | PRN

## 2022-05-07 MED ORDER — ACETAMINOPHEN 325 MG PO TABS
650.0000 mg | ORAL_TABLET | Freq: Once | ORAL | Status: AC
  Administered 2022-05-07: 650 mg via ORAL
  Filled 2022-05-07: qty 2

## 2022-05-07 MED ORDER — LIDOCAINE 5 % EX PTCH
1.0000 | MEDICATED_PATCH | CUTANEOUS | 0 refills | Status: DC
Start: 2022-05-07 — End: 2022-06-13

## 2022-05-07 NOTE — ED Notes (Signed)
Patient transported to X-ray 

## 2022-05-07 NOTE — ED Triage Notes (Signed)
Pt reports mechanical fall outside yesterday from standing position after tripping. Pain to central chest radiating under right arm.  Denies shob Hx Thoracic aortic aneurysm

## 2022-05-07 NOTE — ED Notes (Signed)
Patient instructed on use of incentive spirometer. Teach back method used. Patient verbalizes understanding.

## 2022-05-07 NOTE — Discharge Instructions (Addendum)
As we discussed, your work-up in the ER today was reassuring for acute abnormalities.  X-ray imaging of your chest did not show any fractures in your rib cage.  However, x-ray imaging can occasionally miss a small fracture, and therefore I have given you an incentive spirometer for you to use to help maintain your lung function while you are recovering from your injury. I have also given you a prescription for a short course of narcotics for you to use as needed for severe pain only.  Please do not drive or operate heavy machinery after taking this medication.  I have also given you a prescription for lidocaine patches which she can place over areas of pain to help with your symptoms.  You may take Tylenol and ibuprofen as well.  Follow-up with your primary care doctor in the next few days for your additional health needs.  Return to development of any new or worsening symptoms.

## 2022-05-07 NOTE — ED Provider Notes (Signed)
[place] DEPT Provider Note   CSN: [id number] Arrival date & time: 05/07/22  1053     History  Chief Complaint  Patient presents with   Howard [name] is a 71 y.o. male.  Patient with history of hypertension, GERD, thoracic ascending aortic aneurysm presents today with complaints of fall.  He states that same occurred yesterday when he was chopping wood in the backyard.  States that he tripped over a log and fell onto his right chest.  He did not hit his head or lose consciousness.  He is not anticoagulated.  States that since the fall he has had right-sided rib pain that is worse with coughing and deep breathing.  He states that the pain started immediately after he struck the ground on his right chest and has been persistent and unchanged since then. Pain is reproducible to palpation over the right ribcage and does not radiate.   The history is provided by the patient. No language interpreter was used.  Fall       Home Medications Prior to Admission medications   Medication Sig Start Date End Date Taking? Authorizing Provider  aspirin EC 81 MG tablet Take 1 tablet (81 mg total) by mouth daily. Swallow whole. 12/28/20   Richardson Dopp T, PA-C  atorvastatin (LIPITOR) 40 MG tablet TAKE 1 TABLET BY MOUTH EVERY DAY 12/20/21   Richardson Dopp T, PA-C  Cholecalciferol 125 MCG (5000 UT) capsule Take 5,000 Units by mouth daily.    [provider]  metoprolol succinate (TOPROL-XL) 25 MG 24 hr tablet Take 1 tablet (25 mg total) by mouth daily. 12/09/21   Tonia Ghent, MD  Omeprazole-Sodium Bicarbonate (ZEGERID) 20-1100 MG CAPS capsule Take 1 capsule by mouth daily before breakfast.    [provider]      Allergies    Ibuprofen    Review of Systems   Review of Systems  All other systems reviewed and are negative.   Physical Exam Updated Vital Signs BP (!) 129/95   Pulse 61   Temp 98.7 F (37.1 C) (Oral)   Resp 15    Ht '5\' 9"'$  (1.753 m)   Wt 92 kg   SpO2 97%   BMI 29.95 kg/m  Physical Exam Vitals and nursing note reviewed.  Constitutional:      General: He is not in acute distress.    Appearance: Normal appearance. He is normal weight. He is not ill-appearing, toxic-appearing or diaphoretic.  HENT:     Head: Normocephalic and atraumatic.  Cardiovascular:     Rate and Rhythm: Normal rate and regular rhythm.     Pulses: Normal pulses.          Radial pulses are 2+ on the right side and 2+ on the left side.     Heart sounds: Normal heart sounds.  Pulmonary:     Effort: Pulmonary effort is normal. No respiratory distress.     Breath sounds: Normal breath sounds.     Comments: Tenderness to palpation over the right lateral chest wall. No overlying skin changes, swelling, bruising, or crepitus.  Abdominal:     General: Abdomen is flat.     Palpations: Abdomen is soft.     Comments: No bruising  Musculoskeletal:        General: Normal range of motion.     Cervical back: Normal range of motion.     Comments: Ambulatory with normal gait  Skin:    General:  Skin is warm and dry.  Neurological:     General: No focal deficit present.     Mental Status: He is alert.  Psychiatric:        Mood and Affect: Mood normal.        Behavior: Behavior normal.     ED Results / Procedures / Treatments   Labs (all labs ordered are listed, but only abnormal results are displayed) Labs Reviewed - No data to display  EKG None  Radiology DG Ribs Unilateral W/Chest Right  Result Date: 05/07/2022 CLINICAL DATA:  Fall, right rib pain EXAM: RIGHT RIBS AND CHEST - 3+ VIEW COMPARISON:  03/19/2021 FINDINGS: No displaced fracture or other bone lesions are seen involving the ribs. There is no evidence of pneumothorax or pleural effusion. Both lungs are clear. Heart size and mediastinal contours are within normal limits. IMPRESSION: No displaced right rib fracture. No pneumothorax. Electronically Signed   By: Davina Poke D.O.   On: 05/07/2022 12:14    Procedures Procedures    Medications Ordered in ED Medications  acetaminophen (TYLENOL) tablet 650 mg (650 mg Oral Given 05/07/22 1242)    ED Course/ Medical Decision Making/ A&P                           Medical Decision Making Amount and/or Complexity of Data Reviewed Radiology: ordered.  Risk OTC drugs.   Patient presents today with right sided rib pain after a fall yesterday. He is afebrile, non-toxic appearing, and in no acute distress with reassuring vital signs. Physical exam reveals tenderness to palpation over the right ribcage without crepitus. Lung sounds present and equal. X-ray imaging obtained of the patients chest which was unremarkable for acute findings. I have personally reviewed and interpreted this imaging and agree with radiology interpretation.   EKG obtained which showed sinus rhythm.  Patient given Tylenol ED today with improvement in his symptoms.  Given patient's presentation with benign physical exam and stable vital signs, very low suspicion for ACS, PE, or aortic dissection.  I did discuss with the patient that he could have a small rib fracture that did not show up on x-ray.  I have given him an incentive spirometer to use at home and have instructed him on how to use this.  I have also given him a prescription for short course of narcotic pain medication to help with his symptoms as well as lidocaine patches if his pain worsens.  Patient is understanding and amenable with plan, educated on red flag symptoms that would prompt immediate return.  Discharged in stable condition.   This is a shared visit with supervising physician Dr. Philip Aspen who has independently evaluated patient & provided guidance in evaluation/management/disposition, in agreement with care    Final Clinical Impression(s) / ED Diagnoses Final diagnoses:  Fall, initial encounter  Rib pain on right side    Rx / DC Orders ED Discharge Orders           Ordered    oxyCODONE (ROXICODONE) 5 MG immediate release tablet  Every 6 hours PRN        05/07/22 1322    lidocaine (LIDODERM) 5 %  Every 24 hours        05/07/22 1322          An After Visit Summary was printed and given to the patient.     Nestor Lewandowsky 05/07/22 1325    Fransico Meadow, MD  05/09/22 1217  

## 2022-06-13 ENCOUNTER — Encounter: Payer: Self-pay | Admitting: Nurse Practitioner

## 2022-06-13 ENCOUNTER — Ambulatory Visit (INDEPENDENT_AMBULATORY_CARE_PROVIDER_SITE_OTHER): Payer: Medicare Other | Admitting: Nurse Practitioner

## 2022-06-13 VITALS — BP 122/64 | HR 64 | Temp 98.6°F | Resp 14 | Wt 205.2 lb

## 2022-06-13 DIAGNOSIS — R350 Frequency of micturition: Secondary | ICD-10-CM | POA: Diagnosis not present

## 2022-06-13 DIAGNOSIS — R300 Dysuria: Secondary | ICD-10-CM | POA: Diagnosis not present

## 2022-06-13 LAB — POC URINALSYSI DIPSTICK (AUTOMATED)
Bilirubin, UA: NEGATIVE
Blood, UA: NEGATIVE
Glucose, UA: NEGATIVE
Ketones, UA: NEGATIVE
Leukocytes, UA: NEGATIVE
Nitrite, UA: NEGATIVE
Protein, UA: NEGATIVE
Spec Grav, UA: 1.025 (ref 1.010–1.025)
Urobilinogen, UA: 0.2 E.U./dL
pH, UA: 6 (ref 5.0–8.0)

## 2022-06-13 MED ORDER — CEPHALEXIN 500 MG PO CAPS: 500.0000 mg | ORAL_CAPSULE | Freq: Two times a day (BID) | ORAL | 0 refills | Status: DC

## 2022-06-13 NOTE — Assessment & Plan Note (Signed)
Increasing since the weekend with symptoms of going on for approximately 2 weeks given age and presentation like to treat with Keflex 500 mg twice daily for a week.  Pending urine culture

## 2022-06-13 NOTE — Patient Instructions (Signed)
Nice to see you today I have sent in an antibiotic. Let me know if you do not start improving. I will be in touch with the urine results once I have it Follow up in office if you do not improve

## 2022-06-13 NOTE — Progress Notes (Signed)
Acute Office Visit  Subjective:     Patient ID: Howard [name], male    DOB: Dec 31, 1950, 71 y.o.   MRN: [id number]  Chief Complaint  Patient presents with   Dysuria    X 2 weeks, has gotten a little worse with pain with urination, urgency to urinate. No blood in the urine.     Patient is in today for urinary symptoms   States that over the past 2 week he has noticed increase in urination including nocturia.   States over the last few days he has noticed stinging at the end of his penis.  Urine is clear to light yellow in color. Has not changed fluids that he is taking in. States that he thought it was coffee but laid off it and the discomfort is still there. States that it has gotten worse since it started on the weekend   History of prostate cancer with radical prostatectomy in 2014.  Review of Systems  Constitutional:  Negative for chills and fever.  Gastrointestinal:  Negative for abdominal pain, nausea and vomiting.  Genitourinary:  Positive for dysuria and frequency. Negative for hematuria and urgency.       Increased noctuira. States he will go 1-2 times   Musculoskeletal:  Negative for back pain.        Objective:    BP 122/64   Pulse 64   Temp 98.6 F (37 C)   Resp 14   Wt 205 lb 4 oz (93.1 kg)   SpO2 98%   BMI 30.31 kg/m    Physical Exam Vitals and nursing note reviewed.  Constitutional:      Appearance: Normal appearance.  Cardiovascular:     Rate and Rhythm: Normal rate and regular rhythm.     Heart sounds: Normal heart sounds.  Pulmonary:     Effort: Pulmonary effort is normal.     Breath sounds: Normal breath sounds.  Abdominal:     General: Bowel sounds are normal. There is no distension.     Palpations: There is no mass.     Tenderness: There is no abdominal tenderness. There is no right CVA tenderness or left CVA tenderness.     Hernia: No hernia is present.  Neurological:     Mental Status: He is alert.     Results for orders  placed or performed in visit on 06/13/22  POCT Urinalysis Dipstick (Automated)  Result Value Ref Range   Color, UA yellow    Clarity, UA clear    Glucose, UA Negative Negative   Bilirubin, UA negative    Ketones, UA negative    Spec Grav, UA 1.025 1.010 - 1.025   Blood, UA negative    pH, UA 6.0 5.0 - 8.0   Protein, UA Negative Negative   Urobilinogen, UA 0.2 0.2 or 1.0 E.U./dL   Nitrite, UA negative    Leukocytes, UA Negative Negative        Assessment & Plan:   Problem List Items Addressed This Visit       Other   Urinary frequency    UA negative in office.  Pending urinary culture.  We will treat with Keflex 500 mg twice daily for 7 days.      Relevant Medications   cephALEXin (KEFLEX) 500 MG capsule   Dysuria - Primary    Increasing since the weekend with symptoms of going on for approximately 2 weeks given age and presentation like to treat with Keflex 500 mg twice daily  for a week.  Pending urine culture      Relevant Medications   cephALEXin (KEFLEX) 500 MG capsule   Other Relevant Orders   POCT Urinalysis Dipstick (Automated) (Completed)   Urine Culture    Meds ordered this encounter  Medications   cephALEXin (KEFLEX) 500 MG capsule    Sig: Take 1 capsule (500 mg total) by mouth 2 (two) times daily.    Dispense:  14 capsule    Refill:  0    Order Specific Question:   Supervising Provider    Answer:   TOWER, MARNE A [[id number]    Return if symptoms worsen or fail to improve.  Romilda Garret, NP

## 2022-06-13 NOTE — Assessment & Plan Note (Signed)
UA negative in office.  Pending urinary culture.  We will treat with Keflex 500 mg twice daily for 7 days.

## 2022-06-14 LAB — URINE CULTURE
MICRO NUMBER:: 14118189
Result:: NO GROWTH
SPECIMEN QUALITY:: ADEQUATE

## 2022-06-15 ENCOUNTER — Telehealth: Payer: Self-pay | Admitting: Nurse Practitioner

## 2022-06-15 NOTE — Telephone Encounter (Signed)
If he has not had any improvement then he can stop the antibiotic. Is he having any urinary flow issues like difficulty getting the flow started or post void dribbling. If so we can start a medication that can help that if not we can always send him to urology for further evaluation or he can see Dr. Damita Dunnings when he is back in office

## 2022-06-15 NOTE — Telephone Encounter (Signed)
-----   Message from [place] sent at 06/15/2022 12:31 PM EDT ----- Patient advised. Patient states he has had no improvement with symptoms. No new symptoms. Just the discomfort and urgency is present to urinate.

## 2022-06-15 NOTE — Telephone Encounter (Signed)
Howard [name] called back. Howard [name] asked for Dr Damita Dunnings to review the note and his recent results to see if he has the same recommendations if he should come back and see him or go ahead and be seen by specialist. Howard [name] is aware Dr Damita Dunnings is out of the office until next week.

## 2022-06-15 NOTE — Telephone Encounter (Signed)
Patient states he is not having any issues with urinary flow issues like difficulty getting the flow started or post void dribbling. Patient will call back to schedule follow up with Dr Damita Dunnings

## 2022-06-19 NOTE — Telephone Encounter (Signed)
I agree with the rec about the abx.  If still having symptoms, then we can either treat or refer to urology.  Let me know if still having symptoms and if so let me know which way he wants to go.  Thanks.

## 2022-06-20 MED ORDER — TAMSULOSIN HCL 0.4 MG PO CAPS: 0.4000 mg | ORAL_CAPSULE | Freq: Every day | ORAL | 3 refills | Status: DC

## 2022-06-20 NOTE — Telephone Encounter (Signed)
Spoke with patient and he states he is still having sx; only freq urination and slight discomfort when going. Patient would like for you to treat before sending to urology.

## 2022-06-20 NOTE — Telephone Encounter (Signed)
Patient notified as instructed by telephone and verbalized understanding. 

## 2022-06-20 NOTE — Addendum Note (Signed)
Addended by: Tonia Ghent on: 06/20/2022 01:52 PM   Modules accepted: Orders

## 2022-06-20 NOTE — Telephone Encounter (Signed)
Would try flomax and let me know if that isn't helping.  Caution re: runny nose and getting lightheaded on standing when using med.  Thanks.  Rx sent.

## 2022-07-06 ENCOUNTER — Encounter: Payer: Self-pay | Admitting: Gastroenterology

## 2022-07-21 ENCOUNTER — Encounter: Payer: Self-pay | Admitting: Family Medicine

## 2022-07-21 ENCOUNTER — Ambulatory Visit (INDEPENDENT_AMBULATORY_CARE_PROVIDER_SITE_OTHER): Payer: Medicare Other | Admitting: Family Medicine

## 2022-07-21 VITALS — BP 140/82 | HR 66 | Temp 97.5°F | Ht 69.0 in | Wt 208.0 lb

## 2022-07-21 DIAGNOSIS — J069 Acute upper respiratory infection, unspecified: Secondary | ICD-10-CM

## 2022-07-21 DIAGNOSIS — R300 Dysuria: Secondary | ICD-10-CM | POA: Diagnosis not present

## 2022-07-21 DIAGNOSIS — Z125 Encounter for screening for malignant neoplasm of prostate: Secondary | ICD-10-CM | POA: Diagnosis not present

## 2022-07-21 MED ORDER — AMOXICILLIN-POT CLAVULANATE 875-125 MG PO TABS: 1.0000 | ORAL_TABLET | Freq: Two times a day (BID) | ORAL | 0 refills | Status: DC

## 2022-07-21 NOTE — Patient Instructions (Addendum)
Go to the lab on the way out.   If you have mychart we'll likely use that to update you.    Take care.  Glad to see you. Hold the antibiotics.  If you have a fever or facial pain, then start the prescription.  Rest fluids and tylenol as needed in the meantime.

## 2022-07-21 NOTE — Progress Notes (Signed)
Cough, congestion, sore throat since before thanksgiving. Patient has been taking tylenol lately and had been taking nyquil at night before.   duration of symptoms: ~2 weeks.  rhinorrhea: yes congestion:yes ear pain: no sore throat: yes cough: yes myalgias: no Fever: no Prev with some sputum, better in the last day or so.    He is getting better in the meantime but not back to normal.   Taking flomax in the meantime.  He improved on flomax.  Stopped med and burning returned.  Improved again on restart of med. D/w pt about recheck PSA today.   See notes on labs.  Per HPI unless specifically indicated in ROS section   Meds, vitals, and allergies reviewed.   GEN: nad, alert and oriented HEENT: mucous membranes moist, TM w/o erythema, nasal epithelium normal, OP with minimal cobblestoning NECK: supple w/o LA CV: rrr. PULM: ctab, no inc wob ABD: soft, +bs EXT: no edema

## 2022-07-22 LAB — PSA, MEDICARE: PSA: 0 ng/ml — ABNORMAL LOW (ref 0.10–4.00)

## 2022-07-24 NOTE — Assessment & Plan Note (Signed)
Discussed rechecking PSA.  See notes on labs.

## 2022-07-24 NOTE — Assessment & Plan Note (Signed)
Discussed options. Hold Augmentin for now.  If fever or facial pain, then start the prescription.  Rest fluids and tylenol as needed in the meantime.  Update me as needed.  He agrees to plan.

## 2022-07-28 ENCOUNTER — Telehealth: Payer: Self-pay | Admitting: Family Medicine

## 2022-07-28 NOTE — Telephone Encounter (Signed)
Patient called in returning a call he received and relayed result message. Patient understood.

## 2022-08-21 ENCOUNTER — Emergency Department (HOSPITAL_COMMUNITY)
Admission: EM | Admit: 2022-08-21 | Discharge: 2022-08-21 | Disposition: A | Discharge status: Home / Self Care | Payer: Medicare Other | Attending: Emergency Medicine | Admitting: Emergency Medicine

## 2022-08-21 ENCOUNTER — Emergency Department (HOSPITAL_COMMUNITY): Payer: Medicare Other

## 2022-08-21 DIAGNOSIS — Z7982 Long term (current) use of aspirin: Secondary | ICD-10-CM | POA: Diagnosis not present

## 2022-08-21 DIAGNOSIS — M25542 Pain in joints of left hand: Secondary | ICD-10-CM | POA: Insufficient documentation

## 2022-08-21 DIAGNOSIS — Z79899 Other long term (current) drug therapy: Secondary | ICD-10-CM | POA: Insufficient documentation

## 2022-08-21 DIAGNOSIS — Z8546 Personal history of malignant neoplasm of prostate: Secondary | ICD-10-CM | POA: Insufficient documentation

## 2022-08-21 DIAGNOSIS — D72829 Elevated white blood cell count, unspecified: Secondary | ICD-10-CM | POA: Diagnosis not present

## 2022-08-21 DIAGNOSIS — Y92009 Unspecified place in unspecified non-institutional (private) residence as the place of occurrence of the external cause: Secondary | ICD-10-CM | POA: Diagnosis not present

## 2022-08-21 DIAGNOSIS — W2209XA Striking against other stationary object, initial encounter: Secondary | ICD-10-CM | POA: Insufficient documentation

## 2022-08-21 DIAGNOSIS — I10 Essential (primary) hypertension: Secondary | ICD-10-CM | POA: Insufficient documentation

## 2022-08-21 DIAGNOSIS — M79642 Pain in left hand: Secondary | ICD-10-CM

## 2022-08-21 DIAGNOSIS — M79645 Pain in left finger(s): Secondary | ICD-10-CM

## 2022-08-21 LAB — CBC
HCT: 46.7 % (ref 39.0–52.0)
Hemoglobin: 15.3 g/dL (ref 13.0–17.0)
MCH: 29.9 pg (ref 26.0–34.0)
MCHC: 32.8 g/dL (ref 30.0–36.0)
MCV: 91.2 fL (ref 80.0–100.0)
Platelets: 220 10*3/uL (ref 150–400)
RBC: 5.12 MIL/uL (ref 4.22–5.81)
RDW: 13.4 % (ref 11.5–15.5)
WBC: 12 10*3/uL — ABNORMAL HIGH (ref 4.0–10.5)
nRBC: 0 % (ref 0.0–0.2)

## 2022-08-21 LAB — COMPREHENSIVE METABOLIC PANEL
ALT: 29 U/L (ref 0–44)
AST: 42 U/L — ABNORMAL HIGH (ref 15–41)
Albumin: 3.5 g/dL (ref 3.5–5.0)
Alkaline Phosphatase: 109 U/L (ref 38–126)
Anion gap: 7 (ref 5–15)
BUN: 23 mg/dL (ref 8–23)
CO2: 25 mmol/L (ref 22–32)
Calcium: 9.6 mg/dL (ref 8.9–10.3)
Chloride: 105 mmol/L (ref 98–111)
Creatinine, Ser: 1.4 mg/dL — ABNORMAL HIGH (ref 0.61–1.24)
GFR, Estimated: 53 mL/min — ABNORMAL LOW (ref >60)
Glucose, Bld: 112 mg/dL — ABNORMAL HIGH (ref 70–99)
Potassium: 4.8 mmol/L (ref 3.5–5.1)
Sodium: 137 mmol/L (ref 135–145)
Total Bilirubin: 0.6 mg/dL (ref 0.3–1.2)
Total Protein: 7.4 g/dL (ref 6.5–8.1)

## 2022-08-21 LAB — SEDIMENTATION RATE: Sed Rate: 21 mm/hr — ABNORMAL HIGH (ref 0–16)

## 2022-08-21 LAB — URIC ACID: Uric Acid, Serum: 5.8 mg/dL (ref 3.7–8.6)

## 2022-08-21 LAB — C-REACTIVE PROTEIN: CRP: 0.7 mg/dL (ref <1.0)

## 2022-08-21 MED ORDER — AMOXICILLIN-POT CLAVULANATE 875-125 MG PO TABS: 1.0000 | ORAL_TABLET | Freq: Two times a day (BID) | ORAL | 0 refills | Status: AC

## 2022-08-21 MED ORDER — SODIUM CHLORIDE 0.9 % IV SOLN: 1.5000 g | Freq: Once | INTRAVENOUS | Status: DC

## 2022-08-21 MED ORDER — SODIUM CHLORIDE 0.9 % IV SOLN
3.0000 g | Freq: Once | INTRAVENOUS | Status: AC
  Administered 2022-08-21: 3 g via INTRAVENOUS
  Filled 2022-08-21: qty 8

## 2022-08-21 NOTE — Consult Note (Signed)
ORTHOPAEDIC CONSULTATION  REQUESTING PHYSICIAN: Gareth Morgan, MD  Chief Complaint: Left hand pain  HPI: Howard [name] is a 72 y.o. male who presents with left hand pain and redness and swelling since Friday.  Denies any injuries, fevers, chills, h/o gout.  Past Medical History:  Diagnosis Date   Anesthesia complication    prolonged sedation after colonoscopy   Aortic atherosclerosis ([place])    CT 02/2020   Colon polyps    on colonoscopy 08/2010   ED (erectile dysfunction)    Essential hypertension 06/25/2008   Qualifier: Diagnosis of  By: Council Mechanic MD, Hilaria Ota    GERD (gastroesophageal reflux disease)    HYPERLIPIDEMIA 07/24/2008   Qualifier: Diagnosis of  By: Council Mechanic MD, Hilaria Ota    Hypersomnia 01/05/2017   Near syncope 03/14/2017   Neck pain 12/15/2011   OSA (obstructive sleep apnea) 11/13/2016   Dr. Elsworth Soho   Prostate cancer [facility])    s/p prostatectomy   Sleep apnea 2018   Thoracic ascending aortic aneurysm ([place]) 04/17/2017   Chest CTA 10/18: Aorta 4.1 cm// Echo 8/18:  - 4.3 cm. Aortic arch is 3.3 cm // CT 01/2019: aorta 3.8 cm; sinuses of Valsalva 4.5 cm // Chest CTA 7/21: TAA 4.2 cm, cholelithiasis, aortic atherosclerosis // Chest CTA 8/22: Ascending thoracic aorta 4 cm, coronary calcification, cholelithiasis, aortic atherosclerosis//Chest CTA 8/22: Ascending thoracic aorta 4 cm   Past Surgical History:  Procedure Laterality Date   COLONOSCOPY     ESOPHAGOGASTRODUODENOSCOPY  10/02/2000   dilated reflux esopsh hh   [facility]: Dehydration; vasvagal sync  12/09/ 05/2003   PLANTAR FASCIA SURGERY Bilateral    POLYPECTOMY     ROBOT ASSISTED LAPAROSCOPIC RADICAL PROSTATECTOMY N/A 05/27/2013   Procedure: ROBOTIC ASSISTED LAPAROSCOPIC RADICAL PROSTATECTOMY LEVEL 1;  Surgeon: Dutch Gray, MD;  Location: [facility];  Service: Urology;  Laterality: N/A;   UPPER GASTROINTESTINAL ENDOSCOPY     Social History   Socioeconomic History   Marital status: Married    Spouse name: Not  on file   Number of children: 3   Years of education: 12   Highest education level: Not on file  Occupational History   Occupation: Retired    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     Married 1976, lives with wife. 1 step daughter, 2 daughters and 1 son.   [place] [employer] fan   Left handed   Social Determinants of Health   Financial Resource Strain: Low Risk  (11/17/2021)   Overall Financial Resource Strain (CARDIA)    Difficulty of Paying Living Expenses: Not hard at all  Food Insecurity: No Food Insecurity (11/17/2021)   Hunger Vital Sign    Worried About Running Out of Food in the Last Year: Never true    Ran Out of Food in the Last Year: Never true  Transportation Needs: No Transportation Needs (11/17/2021)   PRAPARE - Hydrologist (Medical): No    Lack of Transportation (Non-Medical):  No  Physical Activity: Insufficiently Active (11/17/2021)   Exercise Vital Sign    Days of Exercise per Week: 3 days    Minutes of Exercise per Session: 40 min  Stress: No Stress Concern Present (11/17/2021)   [place]    Feeling of Stress : Not at all  Social Connections: Moderately Integrated (11/17/2021)   Social Connection and Isolation Panel [NHANES]    Frequency of Communication with Friends and Family: More than three times a week    Frequency of Social Gatherings with Friends and Family: More than three times a week    Attends Religious Services: More than 4 times per year     Active Member of [employer] or Organizations: No    Attends Music therapist: Never    Marital Status: Married   Family History  Problem Relation Age of Onset   Stroke Mother    Prostate cancer Neg Hx    Colon cancer Neg Hx    Aortic dissection Neg Hx    Esophageal cancer Neg Hx    Colon polyps Neg Hx    Rectal cancer Neg Hx    Stomach cancer Neg Hx    - negative except otherwise stated in the family history section Allergies  Allergen Reactions   Ibuprofen     "Eats a hole in the lining of his stomach"   Prior to Admission medications   Medication Sig Start Date End Date Taking? Authorizing Provider  amoxicillin-clavulanate (AUGMENTIN) 875-125 MG tablet Take 1 tablet by mouth 2 (two) times daily. 07/21/22   Tonia Ghent, MD  aspirin EC 81 MG tablet Take 1 tablet (81 mg total) by mouth daily. Swallow whole. 12/28/20   Richardson Dopp T, PA-C  atorvastatin (LIPITOR) 40 MG tablet TAKE 1 TABLET BY MOUTH EVERY DAY 12/20/21   Richardson Dopp T, PA-C  Cholecalciferol 125 MCG (5000 UT) capsule Take 5,000 Units by mouth daily.    [provider]  metoprolol succinate (TOPROL-XL) 25 MG 24 hr tablet Take 1 tablet (25 mg total) by mouth daily. 12/09/21   Tonia Ghent, MD  Omeprazole-Sodium Bicarbonate (ZEGERID) 20-1100 MG CAPS capsule Take 1 capsule by mouth daily before breakfast.    [provider]  tamsulosin (FLOMAX) 0.4 MG CAPS capsule Take 1 capsule (0.4 mg total) by mouth daily. 06/20/22   Tonia Ghent, MD   DG Hand Complete Left  Result Date: 08/21/2022 CLINICAL DATA:  Trauma 4 days ago. Pain, particularly around the thumb. EXAM: LEFT HAND - COMPLETE 3+ VIEW COMPARISON:  None Available. FINDINGS: Soft tissue calcification is identified between the base of the first metacarpal and trapezium. The primary calcification is well corticated and not thought to be acute. No donor site is identified to suggest a fracture. No acute fractures are noted. No dislocation.  No other acute abnormalities. IMPRESSION: The soft tissue calcification between the base of the first metacarpal and trapezium is favored to represent sequela of a chronic process. No definitive acute fracture identified. Electronically Signed   By: Dorise Bullion III M.D.   On: 08/21/2022 08:12   - pertinent xrays, CT, MRI studies were reviewed and independently interpreted  Positive ROS: All other systems have been reviewed and were otherwise negative with the exception of those mentioned in the HPI and as above.  Physical Exam: General: No acute distress Cardiovascular: No pedal edema Respiratory: No cyanosis, no use of accessory musculature GI: No organomegaly, abdomen is soft  and non-tender Skin: No lesions in the area of chief complaint Neurologic: Sensation intact distally Psychiatric: Patient is at baseline mood and affect Lymphatic: No axillary or cervical lymphadenopathy  MUSCULOSKELETAL:  Redness, swelling overlying first ray on dorsal aspect.  Pain with palpation over redness.  Mainly pain with PROM of MP and CMC joint.  IP joint motion well tolerated.  Negative kanavel signs.  Wrist, elbow, shoulder exams are normal.  Assessment: Left hand pain, swelling  Plan: - difficult to say infectious vs gouty process, discussed with patient that gout is still possible in light of normal uric acid levels - will empirically treat with a dose of IV unasyn in ED and then send home with 10 days po augmention 875 bid - will follow up with me in office on Wednesday for recheck  Thank you for the consult and the opportunity to see Mr. Evangeline Dakin Eduard Roux, MD [facility] 11:53 AM

## 2022-08-21 NOTE — ED Provider Notes (Signed)
[facility] DEPT Provider Note   CSN: [id number] Arrival date & time: 08/21/22  9833     History  Chief Complaint  Patient presents with   Hand Pain    Howard [name] is a 72 y.o. male.  72 year old male PMH of HTN, GERD, prostate cancer s/p prostatectomy here for left hand pain since Friday that has been worsening.  Patient says on Wednesday of last week he hit his hand on a railing on the left side but he did not think of it much and did not have pain until Friday where he started to have redness and swelling.  Says that the pain is mostly near his thumb on the left side and that the pain shoots up all the way into his arm.  He denies any systemic symptoms of vomiting, nausea, fevers or chills.  He does say he was dizzy in the morning on Friday and Saturday but not today.  Has been taking Tylenol as needed for pain last time he took it was yesterday.  He denies any history of arthritis or gout.   Hand Pain Pertinent negatives include no chest pain, no abdominal pain and no shortness of breath.       Home Medications Prior to Admission medications   Medication Sig Start Date End Date Taking? Authorizing Provider  amoxicillin-clavulanate (AUGMENTIN) 875-125 MG tablet Take 1 tablet by mouth 2 (two) times daily. 07/21/22   Tonia Ghent, MD  aspirin EC 81 MG tablet Take 1 tablet (81 mg total) by mouth daily. Swallow whole. 12/28/20   Richardson Dopp T, PA-C  atorvastatin (LIPITOR) 40 MG tablet TAKE 1 TABLET BY MOUTH EVERY DAY 12/20/21   Richardson Dopp T, PA-C  Cholecalciferol 125 MCG (5000 UT) capsule Take 5,000 Units by mouth daily.    [provider]  metoprolol succinate (TOPROL-XL) 25 MG 24 hr tablet Take 1 tablet (25 mg total) by mouth daily. 12/09/21   Tonia Ghent, MD  Omeprazole-Sodium Bicarbonate (ZEGERID) 20-1100 MG CAPS capsule Take 1 capsule by mouth daily before breakfast.    [provider]  tamsulosin (FLOMAX) 0.4 MG  CAPS capsule Take 1 capsule (0.4 mg total) by mouth daily. 06/20/22   Tonia Ghent, MD      Allergies    Ibuprofen    Review of Systems   Review of Systems  Constitutional:  Negative for chills and fever.  HENT:  Negative for sore throat.   Eyes:  Negative for visual disturbance.  Respiratory:  Negative for cough and shortness of breath.   Cardiovascular:  Negative for chest pain.  Gastrointestinal:  Negative for abdominal pain, constipation, diarrhea and vomiting.  Genitourinary:  Positive for dysuria. Negative for hematuria.       Takes flomax and this helps with his dysuria  Musculoskeletal:  Negative for arthralgias and back pain.  Skin:  Negative for rash and wound.  Neurological:  Negative for seizures and syncope.  All other systems reviewed and are negative.   Physical Exam Updated Vital Signs BP (!) 137/93 (BP Location: Right Arm)   Pulse 79   Temp 97.9 F (36.6 C) (Oral)   Resp 18   Ht '5\' 9"'$  (1.753 m)   Wt 94.3 kg   SpO2 97%   BMI 30.72 kg/m  Physical Exam Vitals and nursing note reviewed.  Constitutional:      General: He is not in acute distress.    Appearance: Normal appearance. He is well-developed. He is not  toxic-appearing.  HENT:     Head: Normocephalic and atraumatic.  Eyes:     Conjunctiva/sclera: Conjunctivae normal.  Cardiovascular:     Rate and Rhythm: Normal rate and regular rhythm.     Heart sounds: No murmur heard. Pulmonary:     Effort: Pulmonary effort is normal. No respiratory distress.     Breath sounds: Normal breath sounds.  Abdominal:     Palpations: Abdomen is soft.     Tenderness: There is no abdominal tenderness.  Musculoskeletal:     Right wrist: Normal. Normal pulse.     Left wrist: Tenderness present. Decreased range of motion. Normal pulse.     Cervical back: Neck supple.     Comments: Exquisite tenderness, erythema and warmth over 1st MCP region.   Skin:    General: Skin is warm and dry.     Capillary Refill:  Capillary refill takes less than 2 seconds.  Neurological:     Mental Status: He is alert.  Psychiatric:        Mood and Affect: Mood normal.     ED Results / Procedures / Treatments   Labs (all labs ordered are listed, but only abnormal results are displayed) Labs Reviewed  CBC - Abnormal; Notable for the following components:      Result Value   WBC 12.0 (*)    All other components within normal limits  COMPREHENSIVE METABOLIC PANEL - Abnormal; Notable for the following components:   Glucose, Bld 112 (*)    Creatinine, Ser 1.40 (*)    AST 42 (*)    GFR, Estimated 53 (*)    All other components within normal limits  SEDIMENTATION RATE - Abnormal; Notable for the following components:   Sed Rate 21 (*)    All other components within normal limits  URIC ACID  C-REACTIVE PROTEIN    EKG None  Radiology DG Hand Complete Left  Result Date: 08/21/2022 CLINICAL DATA:  Trauma 4 days ago. Pain, particularly around the thumb. EXAM: LEFT HAND - COMPLETE 3+ VIEW COMPARISON:  None Available. FINDINGS: Soft tissue calcification is identified between the base of the first metacarpal and trapezium. The primary calcification is well corticated and not thought to be acute. No donor site is identified to suggest a fracture. No acute fractures are noted. No dislocation. No other acute abnormalities. IMPRESSION: The soft tissue calcification between the base of the first metacarpal and trapezium is favored to represent sequela of a chronic process. No definitive acute fracture identified. Electronically Signed   By: Dorise Bullion III M.D.   On: 08/21/2022 08:12    Procedures Procedures   Medications Ordered in ED Medications - No data to display  ED Course/ Medical Decision Making/ A&P                           Medical Decision Making Patient here with left MCP joint pain and overlying erythema since Friday.  Does have history of trauma on Wednesday of last week of hitting hand on railing.  Does have warmth and exquisite tenderness on examination as well as decreased ROM due to pain. Denies any systemic symptoms of fevers/chills/vomiting and vitals within normal limits here.  X-ray showed no acute fractures but did show soft tissue calcification between the first metatarsal and trapezium. Obtained CBC which showed mild leukocytosis to 12.  CMP showed kidney function around baseline electrolytes within normal limits, ESR mildly elevated to 21 and CRP pending and  uric acid normal at 5.8. Infectious versus gout possibility although uric acid normal. Spoke with Dr. Erlinda Hong with orthopedic surgery consulted who saw in ED and recommended unasyn dose here and outpatient augmentin course for 10 days. Will follow up with hand surgery next week.   Amount and/or Complexity of Data Reviewed Labs: ordered. Radiology: ordered.  Final Clinical Impression(s) / ED Diagnoses Final diagnoses:  None    Rx / DC Orders ED Discharge Orders     None         Gerrit Heck, MD 08/21/22 1210    Gareth Morgan, MD 08/30/22 1137

## 2022-08-21 NOTE — ED Triage Notes (Signed)
L hnd pain that started after hitting his hand on a bannister at home Wednesday. Injury didn't feel significant at the time but became red and swollen with pain with ROM on Thursday. Now pain shoots up into elbow. No h/o arthritis or gout.  Denies fever, chills.

## 2022-08-21 NOTE — Discharge Instructions (Addendum)
This may be a joint infection so we treated you with an antibiotic here in the ED and sent a course to take at home  Please take augmentin twice a day for 10 days Return if having fevers/vomiting or worsening symptoms

## 2022-08-22 ENCOUNTER — Telehealth: Payer: Self-pay | Admitting: Orthopaedic Surgery

## 2022-08-22 ENCOUNTER — Telehealth: Payer: Self-pay | Admitting: *Deleted

## 2022-08-22 NOTE — Telephone Encounter (Signed)
Transition Care Management Follow-up Telephone Call Date of discharge and from where: 08/21/2022 [facility] How have you been since you were released from the hospital? Hand is still hurting medication that was given is helping Any questions or concerns? No  Items Reviewed: Did the pt receive and understand the discharge instructions provided? Yes  Medications obtained and verified?Yes Other? No  Any new allergies since your discharge? No  Dietary orders reviewed? Yes Do you have support at home? Yes   Functional Questionnaire: (I = Independent and D = Dependent) ADLs: i  Bathing/Dressing- i  Meal Prep- i  Eating- i  Maintaining continence- i  Transferring/Ambulation- i  Managing Meds- i  Follow up appointments reviewed:  PCP Hospital f/u appt confirmed? No  pt is following up with hand specialist Specialist Hospital f/u appt confirmed? Yes  calling today to set up an appt Are transportation arrangements needed? No  If their condition worsens, is the pt aware to call PCP or go to the Emergency Dept.? Yes Was the patient provided with contact information for the PCP's office or ED? Yes Was to pt encouraged to call back with questions or concerns? Yes

## 2022-08-22 NOTE — Telephone Encounter (Signed)
Noted. Thanks.

## 2022-08-22 NOTE — Telephone Encounter (Signed)
Called and scheduled patient for this Wednesday morning

## 2022-08-22 NOTE — Telephone Encounter (Signed)
Pt called stating he seen Dr Erlinda Hong in  [facility] ED for finger and Dr Erlinda Hong instructed him to call and set an appt with him this Wednesday. Dr Erlinda Hong has no openings. Please open slot and call pt. Dr Erlinda Hong is in surgery Wed and Thurs. Afternoon. Pt phone number is [phone].

## 2022-08-24 ENCOUNTER — Encounter: Payer: Self-pay | Admitting: Orthopaedic Surgery

## 2022-08-24 ENCOUNTER — Ambulatory Visit (INDEPENDENT_AMBULATORY_CARE_PROVIDER_SITE_OTHER): Payer: Medicare Other | Admitting: Orthopaedic Surgery

## 2022-08-24 DIAGNOSIS — M79645 Pain in left finger(s): Secondary | ICD-10-CM | POA: Diagnosis not present

## 2022-08-24 MED ORDER — PREDNISONE 10 MG (21) PO TBPK: ORAL_TABLET | ORAL | 3 refills | Status: DC

## 2022-08-24 MED ORDER — TRAMADOL HCL 50 MG PO TABS: 50.0000 mg | ORAL_TABLET | Freq: Every day | ORAL | 0 refills | Status: DC | PRN

## 2022-08-24 NOTE — Progress Notes (Signed)
Office Visit Note   Patient: Howard [name]           Date of Birth: 1950-11-17           MRN: [id number] Visit Date: 08/24/2022              Requested by: Tonia Ghent, MD [address] [place],  [place] [address] PCP: Tonia Ghent, MD   Assessment & Plan: Visit Diagnoses:  1. Pain of left thumb     Plan: Impression is left thumb pain, erythema and swelling which has not significantly changed since starting the antibiotics.  At this point, I am thinking this may be more of a gouty attack.  He will however finish his antibiotics but we will also add prednisone Dosepak.  He will follow-up in a week for recheck.  Call with concerns or questions.  Follow-Up Instructions: Return in about 1 week (around 08/31/2022).   Orders:  No orders of the defined types were placed in this encounter.  Meds ordered this encounter  Medications   predniSONE (STERAPRED UNI-PAK 21 TAB) 10 MG (21) TBPK tablet    Sig: Take as directed    Dispense:  21 tablet    Refill:  3   traMADol (ULTRAM) 50 MG tablet    Sig: Take 1-2 tablets (50-100 mg total) by mouth daily as needed.    Dispense:  20 tablet    Refill:  0      Procedures: No procedures performed   Clinical Data: No additional findings.   Subjective: Chief Complaint  Patient presents with   Left Hand - Pain    HPI patient is a very pleasant 72 year old gentleman who comes in today for follow-up from the ED.  He tells me that he hit his left hand on a rail last Wednesday, 08/17/2022 and that 2 days later he noticed pain and swelling.  He was seen in the ED on the seventh.  X-rays were unremarkable for acute findings.  Uric acid level was normal.  White blood cell and inflammatory markers slightly elevated.  He was given a dose of Unasyn and put on Augmentin.  He is here today for follow-up.  He notes his pain has slightly improved.  He is still having some swelling, redness and warmth to the [facility] joint and surrounding area.   He has noticed slight itching to the [facility] joint.  He notes very slight pain of the forearm.  He denies any fevers or chills.  No history of gout.  Review of Systems as detailed in HPI.  All others reviewed and are negative.   Objective: Vital Signs: There were no vitals taken for this visit.  Physical Exam well-developed well-nourished gentleman in no acute distress.  Alert and oriented x 3.  Ortho Exam left hand exam shows mild to moderate swelling, erythema and warmth to the [facility] joint surrounding area.  He does have slight pain and limited range of motion with that joint.  Very mild tenderness of the forearm.  He is neurovascularly intact distally.  Specialty Comments:  No specialty comments available.  Imaging: No new imaging   PMFS History: Patient Active Problem List   Diagnosis Date Noted   Pain in left hand 08/21/2022   Urinary frequency 06/13/2022   Dysuria 06/13/2022   Elevated LFTs 05/26/2021   Fatty liver 05/26/2021   Constipation 04/05/2021   Creatinine elevation 09/03/2020   Gallstones 03/13/2020   Aortic atherosclerosis (HCC)    Meningioma (  [place]) 04/17/2019   Premature atrial beats 05/04/2018   Coronary artery calcification seen on CT scan 05/04/2018   Paroxysmal atrial fibrillation ([place]) 02/20/2018   Vitamin D deficiency 12/10/2017   Metatarsalgia 09/17/2017   Intermittent claudication ([place]) 07/20/2017   OSA (obstructive sleep apnea) 06/02/2017   Thoracic ascending aortic aneurysm ([place]) 04/17/2017   Health care maintenance 12/01/2016   GERD (gastroesophageal reflux disease) 12/01/2016   Advance care planning 08/27/2015   Knee pain 12/10/2014   Hyperlipidemia LDL goal <70 08/04/2014   Medicare annual wellness visit, subsequent 12/20/2012   Prostate cancer ([place]) 01/13/2011   URI (upper respiratory infection) 08/13/2009   Essential hypertension 06/25/2008   ATOPIC RHINITIS 01/26/2007   IMPOTENCE, 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 Elsworth Soho   Prostate cancer [facility])    s/p prostatectomy   Sleep apnea 2018   Thoracic ascending aortic aneurysm ([place]) 04/17/2017   Chest CTA 10/18: Aorta 4.1 cm// Echo 8/18:  - 4.3 cm. Aortic arch is 3.3 cm // CT 01/2019: aorta 3.8 cm; sinuses of Valsalva 4.5 cm // Chest CTA 7/21: TAA 4.2 cm, cholelithiasis, aortic atherosclerosis // Chest CTA 8/22: Ascending thoracic aorta 4 cm, coronary calcification, cholelithiasis, aortic atherosclerosis//Chest CTA 8/22: Ascending thoracic aorta 4 cm    Family History  Problem Relation Age of Onset   Stroke Mother    Prostate cancer Neg Hx    Colon cancer Neg Hx    Aortic dissection Neg Hx    Esophageal cancer Neg Hx    Colon polyps Neg Hx    Rectal cancer Neg Hx    Stomach cancer Neg Hx     Past Surgical History:  Procedure Laterality Date   COLONOSCOPY     ESOPHAGOGASTRODUODENOSCOPY  10/02/2000   dilated reflux esopsh hh   [facility]: Dehydration; vasvagal sync  12/09/ 05/2003   PLANTAR FASCIA SURGERY Bilateral    POLYPECTOMY     ROBOT ASSISTED LAPAROSCOPIC RADICAL PROSTATECTOMY N/A 05/27/2013   Procedure: ROBOTIC ASSISTED LAPAROSCOPIC RADICAL PROSTATECTOMY LEVEL 1;  Surgeon: Dutch Gray, MD;  Location: [facility] ORS;  Service: Urology;  Laterality: N/A;   UPPER GASTROINTESTINAL ENDOSCOPY     Social History   Occupational History   Occupation: Retired    Comment: Engineer, building services  Tobacco Use   Smoking  status: Never   Smokeless tobacco: Never  Vaping Use   Vaping Use: Never used  Substance and Sexual Activity   Alcohol use: No    Alcohol/week: 0.0 standard drinks of alcohol    Comment: no   Drug use: No   Sexual activity: Not on file

## 2022-09-01 ENCOUNTER — Other Ambulatory Visit: Payer: Self-pay | Admitting: Nurse Practitioner

## 2022-09-01 DIAGNOSIS — K7581 Nonalcoholic steatohepatitis (NASH): Secondary | ICD-10-CM

## 2022-09-01 DIAGNOSIS — K7402 Hepatic fibrosis, advanced fibrosis: Secondary | ICD-10-CM

## 2022-09-08 ENCOUNTER — Encounter: Payer: Self-pay | Admitting: Orthopaedic Surgery

## 2022-09-08 ENCOUNTER — Ambulatory Visit (INDEPENDENT_AMBULATORY_CARE_PROVIDER_SITE_OTHER): Payer: Medicare Other | Admitting: Orthopaedic Surgery

## 2022-09-08 DIAGNOSIS — M79645 Pain in left finger(s): Secondary | ICD-10-CM | POA: Diagnosis not present

## 2022-09-08 NOTE — Progress Notes (Signed)
Office Visit Note   Patient: Howard [name]           Date of Birth: 17-Dec-1950           MRN: [id number] Visit Date: 09/08/2022              Requested by: Tonia Ghent, MD [address] [place],  [place] [address] PCP: Tonia Ghent, MD   Assessment & Plan: Visit Diagnoses:  1. Pain of left thumb     Plan: Impression is resolved left thumb pain from steroid taper.  Discussed this was most likely a gouty attack.  He is pretty much back to normal.  He can follow-up with [place] as needed.  Follow-Up Instructions: No follow-ups on file.   Orders:  No orders of the defined types were placed in this encounter.  No orders of the defined types were placed in this encounter.     Procedures: No procedures performed   Clinical Data: No additional findings.   Subjective: Chief Complaint  Patient presents with   Left Hand - Follow-up    HPI [name] returns today for follow-up and recheck of his left hand.  He states that the steroids almost completely resolved his symptoms. Review of Systems   Objective: Vital Signs: There were no vitals taken for this visit.  Physical Exam  Ortho Exam Examination shows that he has a little bit of residual discomfort at the base of the thumb but the redness and tenderness have all resolved.  He has painless range of motion of the thumb. Specialty Comments:  No specialty comments available.  Imaging: No results found.   PMFS History: Patient Active Problem List   Diagnosis Date Noted   Pain in left hand 08/21/2022   Urinary frequency 06/13/2022   Dysuria 06/13/2022   Elevated LFTs 05/26/2021   Fatty liver 05/26/2021   Constipation 04/05/2021   Creatinine elevation 09/03/2020   Gallstones 03/13/2020   Aortic atherosclerosis (HCC)    Meningioma (HCC) 04/17/2019   Premature atrial beats 05/04/2018   Coronary artery calcification seen on CT scan 05/04/2018   Paroxysmal atrial fibrillation ([place]) 02/20/2018    Vitamin D deficiency 12/10/2017   Metatarsalgia 09/17/2017   Intermittent claudication ([place]) 07/20/2017   OSA (obstructive sleep apnea) 06/02/2017   Thoracic ascending aortic aneurysm ([place]) 04/17/2017   Health care maintenance 12/01/2016   GERD (gastroesophageal reflux disease) 12/01/2016   Advance care planning 08/27/2015   Knee pain 12/10/2014   Hyperlipidemia LDL goal <70 08/04/2014   Medicare annual wellness visit, subsequent 12/20/2012   Prostate cancer ([place]) 01/13/2011   URI (upper respiratory infection) 08/13/2009   Essential hypertension 06/25/2008   ATOPIC RHINITIS 01/26/2007   IMPOTENCE, 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 Elsworth Soho   Prostate cancer [facility])    s/p prostatectomy   Sleep apnea 2018   Thoracic ascending aortic  aneurysm ([place]) 04/17/2017   Chest CTA 10/18: Aorta 4.1 cm// Echo 8/18:  - 4.3 cm. Aortic arch is 3.3 cm // CT 01/2019: aorta 3.8 cm; sinuses of Valsalva 4.5 cm // Chest CTA 7/21: TAA 4.2 cm, cholelithiasis, aortic atherosclerosis // Chest CTA 8/22: Ascending thoracic aorta 4 cm, coronary calcification, cholelithiasis, aortic atherosclerosis//Chest CTA 8/22: Ascending thoracic aorta 4 cm    Family History  Problem Relation Age of Onset   Stroke Mother    Prostate cancer Neg Hx    Colon cancer Neg Hx    Aortic dissection Neg Hx    Esophageal cancer Neg Hx    Colon polyps Neg Hx    Rectal cancer Neg Hx     Stomach cancer Neg Hx     Past Surgical History:  Procedure Laterality Date   COLONOSCOPY     ESOPHAGOGASTRODUODENOSCOPY  10/02/2000   dilated reflux esopsh hh   [facility]: Dehydration; vasvagal sync  12/09/ 05/2003   PLANTAR FASCIA SURGERY Bilateral    POLYPECTOMY     ROBOT ASSISTED LAPAROSCOPIC RADICAL PROSTATECTOMY N/A 05/27/2013   Procedure: ROBOTIC ASSISTED LAPAROSCOPIC RADICAL PROSTATECTOMY LEVEL 1;  Surgeon: Dutch Gray, MD;  Location: [facility];  Service: Urology;  Laterality: N/A;   UPPER GASTROINTESTINAL ENDOSCOPY     Social History   Occupational History   Occupation: Retired    Comment: Engineer, building services  Tobacco Use   Smoking status: Never   Smokeless tobacco: Never  Vaping Use   Vaping Use: Never used  Substance and Sexual Activity   Alcohol use: No    Alcohol/week: 0.0 standard drinks of alcohol    Comment: no   Drug use: No   Sexual activity: Not on file

## 2022-09-14 ENCOUNTER — Other Ambulatory Visit: Payer: Self-pay | Admitting: Family Medicine

## 2022-09-22 ENCOUNTER — Ambulatory Visit
Admission: RE | Admit: 2022-09-22 | Discharge: 2022-09-22 | Disposition: A | Discharge status: Home / Self Care | Payer: Medicare Other | Source: Ambulatory Visit | Attending: Nurse Practitioner | Admitting: Nurse Practitioner

## 2022-09-22 DIAGNOSIS — K7402 Hepatic fibrosis, advanced fibrosis: Secondary | ICD-10-CM

## 2022-09-22 DIAGNOSIS — K7581 Nonalcoholic steatohepatitis (NASH): Secondary | ICD-10-CM

## 2022-10-13 ENCOUNTER — Encounter: Payer: Self-pay | Admitting: Family Medicine

## 2022-10-13 ENCOUNTER — Ambulatory Visit (INDEPENDENT_AMBULATORY_CARE_PROVIDER_SITE_OTHER): Payer: Medicare Other | Admitting: Family Medicine

## 2022-10-13 VITALS — BP 120/80 | HR 65 | Temp 97.2°F | Ht 69.0 in | Wt 200.0 lb

## 2022-10-13 DIAGNOSIS — R300 Dysuria: Secondary | ICD-10-CM

## 2022-10-13 DIAGNOSIS — R202 Paresthesia of skin: Secondary | ICD-10-CM | POA: Diagnosis not present

## 2022-10-13 DIAGNOSIS — R739 Hyperglycemia, unspecified: Secondary | ICD-10-CM

## 2022-10-13 LAB — POC URINALSYSI DIPSTICK (AUTOMATED)
Bilirubin, UA: NEGATIVE
Blood, UA: NEGATIVE
Glucose, UA: NEGATIVE
Ketones, UA: NEGATIVE
Leukocytes, UA: NEGATIVE
Nitrite, UA: NEGATIVE
Protein, UA: NEGATIVE
Spec Grav, UA: 1.02 (ref 1.010–1.025)
Urobilinogen, UA: 0.2 E.U./dL
pH, UA: 6 (ref 5.0–8.0)

## 2022-10-13 LAB — BASIC METABOLIC PANEL
BUN: 15 mg/dL (ref 6–23)
CO2: 30 mEq/L (ref 19–32)
Calcium: 10.7 mg/dL — ABNORMAL HIGH (ref 8.4–10.5)
Chloride: 102 mEq/L (ref 96–112)
Creatinine, Ser: 1.35 mg/dL (ref 0.40–1.50)
GFR: 52.35 mL/min — ABNORMAL LOW (ref >60.00)
Glucose, Bld: 111 mg/dL — ABNORMAL HIGH (ref 70–99)
Potassium: 5 mEq/L (ref 3.5–5.1)
Sodium: 139 mEq/L (ref 135–145)

## 2022-10-13 LAB — VITAMIN B12: Vitamin B-12: 361 pg/mL (ref 211–911)

## 2022-10-13 LAB — TSH: TSH: 2.71 u[IU]/mL (ref 0.35–5.50)

## 2022-10-13 NOTE — Progress Notes (Signed)
burning with urination  Intermittent symptoms, better today.  Going on intermittently, chronically.  Still on flomax.  Not having burning every time with urination.  He can go days with symptoms.   Most recent PSA 0.    Tingling  In feet off and on for months.   Intermittent.  Started on R foot.  R 2nd-5th toes. Not the R 1st toe.  "Feels like it is going numb."  Similar distribution on the L foot.  No foot drop.  No radicular pain.    Meds, vitals, and allergies reviewed.   ROS: Per HPI unless specifically indicated in ROS section   Nad Ncat Neck supple, no LA Rrr Ctab Abd soft. Not ttp Normal DP pulses, normal monofilament but dec vibration sensation on the feet.  Normal sens to both monofilament and vibration in the hands.

## 2022-10-13 NOTE — Patient Instructions (Signed)
Go to the lab on the way out.   If you have mychart we'll likely use that to update you.    Take care.  Glad to see you. Drink enough water to keep your urine light colored/diluted.    If your urine test is normal and you keep having symptoms then let me know.

## 2022-10-14 LAB — URINE CULTURE
MICRO NUMBER:: 14632407
Result:: NO GROWTH
SPECIMEN QUALITY:: ADEQUATE

## 2022-10-14 LAB — HEMOGLOBIN A1C: Hgb A1c MFr Bld: 6.3 % (ref 4.6–6.5)

## 2022-10-16 DIAGNOSIS — R202 Paresthesia of skin: Secondary | ICD-10-CM | POA: Insufficient documentation

## 2022-10-16 NOTE — Assessment & Plan Note (Signed)
Advised to drink enough water to keep his urine light colored/diluted.   See notes on labs.  If his testing is normal and he keeps having symptoms then he can let me know.

## 2022-10-16 NOTE — Assessment & Plan Note (Signed)
Altered vibration sensation in the feet bilaterally but not in the hands.  Normal sensation otherwise.  Intact dorsalis pedis pulses.  Discussed options.  No weakness.  See notes on labs.

## 2022-11-10 ENCOUNTER — Ambulatory Visit: Payer: Medicare Other | Attending: Physician Assistant

## 2022-11-10 DIAGNOSIS — E785 Hyperlipidemia, unspecified: Secondary | ICD-10-CM

## 2022-11-10 DIAGNOSIS — I480 Paroxysmal atrial fibrillation: Secondary | ICD-10-CM

## 2022-11-10 DIAGNOSIS — I2510 Atherosclerotic heart disease of native coronary artery without angina pectoris: Secondary | ICD-10-CM

## 2022-11-10 DIAGNOSIS — I7121 Aneurysm of the ascending aorta, without rupture: Secondary | ICD-10-CM

## 2022-11-10 DIAGNOSIS — I700 Atherosclerosis of aorta: Secondary | ICD-10-CM

## 2022-11-10 DIAGNOSIS — I10 Essential (primary) hypertension: Secondary | ICD-10-CM

## 2022-11-11 ENCOUNTER — Telehealth: Payer: Self-pay | Admitting: Physician Assistant

## 2022-11-11 DIAGNOSIS — Z79899 Other long term (current) drug therapy: Secondary | ICD-10-CM

## 2022-11-11 DIAGNOSIS — E785 Hyperlipidemia, unspecified: Secondary | ICD-10-CM

## 2022-11-11 LAB — HEPATIC FUNCTION PANEL
ALT: 30 IU/L (ref 0–44)
AST: 46 IU/L — ABNORMAL HIGH (ref 0–40)
Albumin: 4 g/dL (ref 3.8–4.8)
Alkaline Phosphatase: 130 IU/L — ABNORMAL HIGH (ref 44–121)
Bilirubin Total: 0.7 mg/dL (ref 0.0–1.2)
Bilirubin, Direct: 0.19 mg/dL (ref 0.00–0.40)
Total Protein: 6.8 g/dL (ref 6.0–8.5)

## 2022-11-11 LAB — LIPID PANEL
Chol/HDL Ratio: 2.9 ratio (ref 0.0–5.0)
Cholesterol, Total: 150 mg/dL (ref 100–199)
HDL: 51 mg/dL (ref >39)
LDL Chol Calc (NIH): 83 mg/dL (ref 0–99)
Triglycerides: 85 mg/dL (ref 0–149)
VLDL Cholesterol Cal: 16 mg/dL (ref 5–40)

## 2022-11-11 MED ORDER — ROSUVASTATIN CALCIUM 20 MG PO TABS: 20.0000 mg | ORAL_TABLET | Freq: Every day | ORAL | 3 refills | Status: DC

## 2022-11-11 NOTE — Telephone Encounter (Signed)
Called and spoke with patient who agrees to plan to start Rosuvastatin. Repeat labs entered and scheduled for 12/23/22.

## 2022-11-11 NOTE — Telephone Encounter (Signed)
-----   Message from Theora Gianotti, NP sent at 11/11/2022  1:57 PM EDT ----- Mild elevation of alkaline phosphatase and AST.  Overall stable.  Total cholesterol 150 with an LDL of 83, which is above goal of 70.  Recommend changing from atorvastatin to rosuvastatin 20mg  daily, which may help to get LDL below 70 without significantly increasing liver enzymes.  F/u lipids/lft's in 6-8 wks.

## 2022-11-16 ENCOUNTER — Ambulatory Visit: Payer: Medicare Other | Admitting: Physician Assistant

## 2022-11-17 NOTE — Progress Notes (Signed)
Cardiology Office Note:    Date:  11/18/2022  ID:  Howard [name], DOB Apr 30, 1951, MRN [id number] [facility] Providers Cardiologist:  Donato Schultz, MD Cardiology APP:  Beatrice Lecher, PA-C      Patient Profile:   Hypertension  Thoracic aortic aneurysm CT Jul 21: 4.2 cm  CT 8/22: 4 cm Coronary Ca2+ CT 6/19 Myoview 02/16/2018: No ischemia, EF 50 Atrial fibrillation - noted on stress test in 2019 TTE 02/20/20: EF 60-65, no RWMA, mild LVH, GR 1 DD, normal RVSF, trivial MR, mild dilation of aortic root and ascending aorta (41 mm)  Monitor 02/22/2018: NSR, freq PACs, brief atrial runs Aortic atherosclerosis  Carotid US 04/16/2019: No significant stenosis bilaterally ABIs 08/11/2017: Normal Hepatic steatosis (? LFTs)/NASH     History of Present Illness:   Howard [name] is a 72 y.o. male returns for follow-up on coronary calcification, thoracic aortic aneurysm, HTN.  He was last seen 11/09/2021.  He is here alone.  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  Recently, he has noted substernal chest discomfort sometime after doing heavy lifting.  This does not occur during exertion.  He has not had shortness of breath, associated radiating symptoms, associated nausea or diaphoresis.  It does seem to be getting worse.  There is no association with meals.  He has not had orthopnea, leg edema, syncope.  Review of Systems  Constitutional: Negative for chills and fever.  Cardiovascular:  Negative for claudication.  Respiratory:  Negative for cough.   Gastrointestinal:  Negative for dysphagia, heartburn and hematochezia.  Genitourinary:  Negative for hematuria.       Studies Reviewed:    EKG: NSR, HR 69, septal Q waves, QTc 409, no ST-T wave changes, similar to previous tracings   Risk Assessment/Calculations:            Physical Exam:   VS:  BP 120/70   Pulse 70   Ht 5\' 9"  (1.753 m)   Wt 207 lb (93.9 kg)   SpO2 98%   BMI 30.57 kg/m    Wt Readings from Last  3 Encounters:  11/18/22 207 lb (93.9 kg)  10/13/22 200 lb (90.7 kg)  08/21/22 208 lb (94.3 kg)    Constitutional:      Appearance: Healthy appearance. Not in distress.  Neck:     Vascular: No carotid bruit. JVD normal.  Pulmonary:     Breath sounds: Normal breath sounds. No wheezing. No rales.  Cardiovascular:     Normal rate. Regular rhythm. Normal S1. Normal S2.      Murmurs: There is no murmur.  Edema:    Peripheral edema absent.  Abdominal:     Palpations: Abdomen is soft.       ASSESSMENT AND PLAN:   Precordial pain He has recently noted progressively worsening chest discomfort that occurs after doing some type of heavy exertion.  This only last seconds.  He has not had any other associated symptoms.  He does have a history of coronary calcification seen on CT scan.  He is due for follow-up on his thoracic aortic aneurysm.  I have recommended proceeding with a coronary CTA with FFR. Arrange CCTA w FFR Continue aspirin 81 mg daily, metoprolol succinate 25 mg daily, Crestor 20 mg daily Follow-up 1 year or sooner if CT abnormal  Thoracic ascending aortic aneurysm (HCC) 4 cm by CT in August 2022.  He is past due for follow-up CT.  Proceed with CCTA as noted.  Hyperlipidemia LDL goal <70 Recent LDL 83 which is above goal.  His Lipitor was discontinued and he was placed on Crestor 20 mg daily.  Follow-up lipids and LFTs are pending.  Essential hypertension Blood pressure is well-controlled.  Continue Toprol-XL 25 mg daily.  Aortic atherosclerosis (HCC) Continue aspirin, statin.         Dispo:  Return in about 1 year (around 11/18/2023) for Routine Follow Up w/ Dr. Anne Fu, or Tereso Newcomer, PA-C. Signed, Tereso Newcomer, PA-C

## 2022-11-18 ENCOUNTER — Encounter: Payer: Self-pay | Admitting: Physician Assistant

## 2022-11-18 ENCOUNTER — Ambulatory Visit: Payer: Medicare Other | Attending: Physician Assistant | Admitting: Physician Assistant

## 2022-11-18 VITALS — BP 120/70 | HR 70 | Ht 69.0 in | Wt 207.0 lb

## 2022-11-18 DIAGNOSIS — I10 Essential (primary) hypertension: Secondary | ICD-10-CM

## 2022-11-18 DIAGNOSIS — I7121 Aneurysm of the ascending aorta, without rupture: Secondary | ICD-10-CM | POA: Diagnosis not present

## 2022-11-18 DIAGNOSIS — E785 Hyperlipidemia, unspecified: Secondary | ICD-10-CM

## 2022-11-18 DIAGNOSIS — I700 Atherosclerosis of aorta: Secondary | ICD-10-CM

## 2022-11-18 DIAGNOSIS — R072 Precordial pain: Secondary | ICD-10-CM | POA: Insufficient documentation

## 2022-11-18 MED ORDER — METOPROLOL TARTRATE 100 MG PO TABS: ORAL_TABLET | ORAL | 0 refills | Status: DC

## 2022-11-18 NOTE — Assessment & Plan Note (Signed)
Blood pressure is well controlled.  Continue Toprol-XL 25 mg daily. 

## 2022-11-18 NOTE — Assessment & Plan Note (Signed)
Recent LDL 83 which is above goal.  His Lipitor was discontinued and he was placed on Crestor 20 mg daily.  Follow-up lipids and LFTs are pending.

## 2022-11-18 NOTE — Assessment & Plan Note (Signed)
Continue aspirin, statin.  

## 2022-11-18 NOTE — Patient Instructions (Signed)
Medication Instructions:  Your physician recommends that you continue on your current medications as directed. Please refer to the Current Medication list given to you today.  *If you need a refill on your cardiac medications before your next appointment, please call your pharmacy*   Lab Work: TODAY:  BMET   If you have labs (blood work) drawn today and your tests are completely normal, you will receive your results only by: MyChart Message (if you have MyChart) OR A paper copy in the mail If you have any lab test that is abnormal or we need to change your treatment, we will call you to review the results.   Testing/Procedures: Your physician has requested that you have cardiac CT. Cardiac computed tomography (CT) is a painless test that uses an x-ray machine to take clear, detailed pictures of your heart. For further information please visit [email or web address] Please follow instruction sheet BELOW:    Your cardiac CT will be scheduled at one of the below locations:   [facility] [address] [place], [place] [address] [phone]   Please arrive at the [facility] and Children's Entrance (Entrance C2) of [facility] 30 minutes prior to test start time. You can use the FREE valet parking offered at entrance C (encouraged to control the heart rate for the test)  Proceed to the [facility] Radiology Department (first floor) to check-in and test prep.  All radiology patients and guests should use entrance C2 at [facility], accessed from [facility], even though the hospital's physical address listed is [address]     Please follow these instructions carefully (unless otherwise directed):  Hold all erectile dysfunction medications at least 3 days (72 hrs) prior to test. (Ie viagra, cialis, sildenafil, tadalafil, etc) We will administer nitroglycerin during this exam.   On the Night Before the Test: Be sure to Drink plenty  of water. Do not consume any caffeinated/decaffeinated beverages or chocolate 12 hours prior to your test. Do not take any antihistamines 12 hours prior to your test.  On the Day of the Test: Drink plenty of water until 1 hour prior to the test. Do not eat any food 1 hour prior to test. You may take your regular medications prior to the test.  Take metoprolol (Lopressor) 100 MG two hours prior to test.  THIS HAS BEEN SEN TO [facility]. YOU WILL NOT TAKE YOUR REGULAR METOPROLOL THIS DAY        After the Test: Drink plenty of water. After receiving IV contrast, you may experience a mild flushed feeling. This is normal. On occasion, you may experience a mild rash up to 24 hours after the test. This is not dangerous. If this occurs, you can take Benadryl 25 mg and increase your fluid intake. If you experience trouble breathing, this can be serious. If it is severe call 911 IMMEDIATELY. If it is mild, please call our office. If you take any of these medications: Glipizide/Metformin, Avandament, Glucavance, please do not take 48 hours after completing test unless otherwise instructed.  We will call to schedule your test 2-4 weeks out understanding that some insurance companies will need an authorization prior to the service being performed.   For non-scheduling related questions, please contact the cardiac imaging nurse navigator should you have any questions/concerns: Rockwell Alexandria, Cardiac Imaging Nurse Navigator Larey Brick, Cardiac Imaging Nurse Navigator [facility] Direct Office Dial: [phone]   For scheduling needs, including cancellations and  rescheduling, please call [place], [phone].      Follow-Up: At [facility], you and your health needs are our priority.  As part of our continuing mission to provide you with exceptional heart care, we have created designated Provider Care Teams.  These Care Teams include your primary Cardiologist  (physician) and Advanced Practice Providers (APPs -  Physician Assistants and Nurse Practitioners) who all work together to provide you with the care you need, when you need it.  We recommend signing up for the patient portal called "MyChart".  Sign up information is provided on this After Visit Summary.  MyChart is used to connect with patients for Virtual Visits (Telemedicine).  Patients are able to view lab/test results, encounter notes, upcoming appointments, etc.  Non-urgent messages can be sent to your provider as well.   To learn more about what you can do with MyChart, go to [email or web address].    Your next appointment:   1 year(s)  Provider:   Donato Schultz, MD  or Tereso Newcomer, PA-C         Other Instructions

## 2022-11-18 NOTE — Assessment & Plan Note (Signed)
He has recently noted progressively worsening chest discomfort that occurs after doing some type of heavy exertion.  This only last seconds.  He has not had any other associated symptoms.  He does have a history of coronary calcification seen on CT scan.  He is due for follow-up on his thoracic aortic aneurysm.  I have recommended proceeding with a coronary CTA with FFR. Arrange CCTA w FFR Continue aspirin 81 mg daily, metoprolol succinate 25 mg daily, Crestor 20 mg daily Follow-up 1 year or sooner if CT abnormal

## 2022-11-18 NOTE — Assessment & Plan Note (Signed)
4 cm by CT in August 2022.  He is past due for follow-up CT.  Proceed with CCTA as noted.

## 2022-11-19 LAB — BASIC METABOLIC PANEL
BUN/Creatinine Ratio: 13 (ref 10–24)
BUN: 18 mg/dL (ref 8–27)
CO2: 25 mmol/L (ref 20–29)
Calcium: 10.2 mg/dL (ref 8.6–10.2)
Chloride: 100 mmol/L (ref 96–106)
Creatinine, Ser: 1.34 mg/dL — ABNORMAL HIGH (ref 0.76–1.27)
Glucose: 103 mg/dL — ABNORMAL HIGH (ref 70–99)
Potassium: 4.8 mmol/L (ref 3.5–5.2)
Sodium: 138 mmol/L (ref 134–144)
eGFR: 56 mL/min/{1.73_m2} — ABNORMAL LOW (ref >59)

## 2022-11-21 NOTE — Progress Notes (Signed)
Pt has been made aware of normal result and verbalized understanding.  jw

## 2022-11-23 ENCOUNTER — Telehealth (HOSPITAL_COMMUNITY): Payer: Self-pay | Admitting: *Deleted

## 2022-11-23 ENCOUNTER — Other Ambulatory Visit (HOSPITAL_COMMUNITY): Payer: Self-pay | Admitting: *Deleted

## 2022-11-23 DIAGNOSIS — I7121 Aneurysm of the ascending aorta, without rupture: Secondary | ICD-10-CM

## 2022-11-23 NOTE — Telephone Encounter (Signed)
Reaching out to patient to offer assistance regarding upcoming cardiac imaging study; pt verbalizes understanding of appt date/time, parking situation and where to check in, pre-test NPO status and medications ordered, and verified current allergies; name and call back number provided for further questions should they arise  Shakyla Nolley RN Navigator Cardiac Imaging [facility] [phone] office [phone] cell  Patient to take 100mg metoprolol tartrate two hours prior to his cardiac CT scan. He is aware to arrive at 12:30 pm. 

## 2022-11-24 ENCOUNTER — Ambulatory Visit (HOSPITAL_COMMUNITY)
Admission: RE | Admit: 2022-11-24 | Discharge: 2022-11-24 | Disposition: A | Discharge status: Home / Self Care | Payer: Medicare Other | Source: Ambulatory Visit | Attending: Physician Assistant | Admitting: Physician Assistant

## 2022-11-24 DIAGNOSIS — R072 Precordial pain: Secondary | ICD-10-CM | POA: Insufficient documentation

## 2022-11-24 DIAGNOSIS — I7121 Aneurysm of the ascending aorta, without rupture: Secondary | ICD-10-CM | POA: Insufficient documentation

## 2022-11-24 DIAGNOSIS — R931 Abnormal findings on diagnostic imaging of heart and coronary circulation: Secondary | ICD-10-CM | POA: Diagnosis present

## 2022-11-24 MED ORDER — NITROGLYCERIN 0.4 MG SL SUBL
0.8000 mg | SUBLINGUAL_TABLET | Freq: Once | SUBLINGUAL | Status: AC
  Administered 2022-11-24: 0.8 mg via SUBLINGUAL

## 2022-11-24 MED ORDER — IOHEXOL 350 MG/ML SOLN
95.0000 mL | Freq: Once | INTRAVENOUS | Status: AC | PRN
  Administered 2022-11-24: 95 mL via INTRAVENOUS

## 2022-11-24 MED ORDER — NITROGLYCERIN 0.4 MG SL SUBL
SUBLINGUAL_TABLET | SUBLINGUAL | Status: AC
  Filled 2022-11-24: qty 2

## 2022-11-25 ENCOUNTER — Ambulatory Visit (HOSPITAL_BASED_OUTPATIENT_CLINIC_OR_DEPARTMENT_OTHER)
Admission: RE | Admit: 2022-11-25 | Discharge: 2022-11-25 | Disposition: A | Discharge status: Home / Self Care | Payer: Medicare Other | Source: Ambulatory Visit | Attending: Cardiology | Admitting: Cardiology

## 2022-11-25 ENCOUNTER — Other Ambulatory Visit: Payer: Self-pay | Admitting: Cardiology

## 2022-11-25 DIAGNOSIS — R931 Abnormal findings on diagnostic imaging of heart and coronary circulation: Secondary | ICD-10-CM

## 2022-11-30 ENCOUNTER — Telehealth: Payer: Self-pay | Admitting: *Deleted

## 2022-11-30 DIAGNOSIS — I7121 Aneurysm of the ascending aorta, without rupture: Secondary | ICD-10-CM

## 2022-11-30 MED ORDER — ISOSORBIDE MONONITRATE ER 30 MG PO TB24: 30.0000 mg | ORAL_TABLET | Freq: Every day | ORAL | 3 refills | Status: DC

## 2022-11-30 NOTE — Telephone Encounter (Signed)
-----   Message from Beatrice Lecher, [place] sent at 11/30/2022  9:44 AM EDT ----- CT shows: #Stable size of ascending aorta at 4.1 cm #Moderate nonobstructive coronary artery disease. The flow through these blockages appears good and therefore, cardiac catheterization is not recommended at this time. I would like to advance his medical Rx and have him f/u sooner. PLAN: -Confirm that he does NOT take medications like Sildenafil (Viagra), Tadalafil (Cialis), Vardenafil (Levitra). These are not listed on his medications. But, please confirm. -If no, start Imdur 30 mg once daily -If yes, start Amlodipine 2.5 mg once daily -Arrange f/u with me or Dr. Anne Fu in 4-6 weeks. -Arrange repeat gated chest/aorta CTA in 1 year (ascending thoracic aortic aneurysm) -He has labs next month to recheck chol. If LDL is not at least < 70, we will need to adjust meds further. Tereso Newcomer, PA-C 11/30/2022 9:35 AM

## 2022-12-14 ENCOUNTER — Other Ambulatory Visit: Payer: Self-pay | Admitting: Physician Assistant

## 2022-12-15 ENCOUNTER — Ambulatory Visit (INDEPENDENT_AMBULATORY_CARE_PROVIDER_SITE_OTHER): Payer: Medicare Other | Admitting: Family Medicine

## 2022-12-15 ENCOUNTER — Telehealth: Payer: Self-pay | Admitting: Family Medicine

## 2022-12-15 ENCOUNTER — Encounter: Payer: Self-pay | Admitting: Family Medicine

## 2022-12-15 VITALS — BP 122/80 | HR 56 | Temp 97.5°F | Ht 69.0 in | Wt 204.0 lb

## 2022-12-15 DIAGNOSIS — R300 Dysuria: Secondary | ICD-10-CM | POA: Diagnosis not present

## 2022-12-15 LAB — POC URINALSYSI DIPSTICK (AUTOMATED)
Bilirubin, UA: NEGATIVE
Blood, UA: NEGATIVE
Glucose, UA: NEGATIVE
Ketones, UA: NEGATIVE
Leukocytes, UA: NEGATIVE
Nitrite, UA: NEGATIVE
Protein, UA: NEGATIVE
Spec Grav, UA: 1.015 (ref 1.010–1.025)
Urobilinogen, UA: 0.2 E.U./dL
pH, UA: 5.5 (ref 5.0–8.0)

## 2022-12-15 MED ORDER — PHENAZOPYRIDINE HCL 100 MG PO TABS: 100.0000 mg | ORAL_TABLET | Freq: Three times a day (TID) | ORAL | 0 refills | Status: DC | PRN

## 2022-12-15 NOTE — Telephone Encounter (Signed)
Noted, thanks, glad to hear.

## 2022-12-15 NOTE — Assessment & Plan Note (Addendum)
Stay off caffeine, continue plenty of water intake.  Continue flomax, start pyridium.  He'll call about seeing urology.  See notes on labs.   U/a wnl, d/w pt at [facility].  Ucx pending.  He agrees. Okay for outpatient f/u.

## 2022-12-15 NOTE — Patient Instructions (Addendum)
We'll update you about your urine culture.  Start pyridium in the meantime.  Keep taking tamsulosin.  Your urine will likely turn orange while on pyridium.   Let me know if pyridium isn't helping.  Stay off caffeine and drink plenty of water.   Please call about seeing [facility].  [phone].  Let me know if you need a referral.    Take care.  Glad to see you.

## 2022-12-15 NOTE — Progress Notes (Signed)
H/o prostate surgery 2014.  On flomax.  Dysuria for months.  He cut out soda. Then cut out tea.  He got better with dec in caffeine but then sx returned even with trigger avoidance.  No abd pain unless urinating.  No discharge.  No skin changes.  Sx were worse with skipping flomax accidentally.    Sensation changes in the feet are better in the meantime.  D/w pt about observation vs neurology eval.  He opts for observation.    Meds, vitals, and allergies reviewed.   ROS: Per HPI unless specifically indicated in ROS section   GEN: nad, alert and oriented HEENT: ncat NECK: supple w/o LA CV: rrr.  PULM: ctab, no inc wob ABD: soft, +bs EXT: no edema SKIN: well perfused.

## 2022-12-15 NOTE — Telephone Encounter (Signed)
Patient called in and wanted to let Dr. Para March know he h as an appointment with Dr. Laverle Patter at [facility] on May 14. Thank you!

## 2022-12-16 LAB — URINE CULTURE
MICRO NUMBER:: 14905128
Result:: NO GROWTH
SPECIMEN QUALITY:: ADEQUATE

## 2022-12-23 ENCOUNTER — Ambulatory Visit: Payer: Medicare Other | Attending: Physician Assistant

## 2022-12-23 DIAGNOSIS — Z79899 Other long term (current) drug therapy: Secondary | ICD-10-CM

## 2022-12-23 DIAGNOSIS — E785 Hyperlipidemia, unspecified: Secondary | ICD-10-CM

## 2022-12-23 LAB — LIPID PANEL
Chol/HDL Ratio: 2.9 ratio (ref 0.0–5.0)
Cholesterol, Total: 151 mg/dL (ref 100–199)
HDL: 52 mg/dL (ref >39)
LDL Chol Calc (NIH): 80 mg/dL (ref 0–99)
Triglycerides: 101 mg/dL (ref 0–149)
VLDL Cholesterol Cal: 19 mg/dL (ref 5–40)

## 2022-12-23 LAB — HEPATIC FUNCTION PANEL
ALT: 12 IU/L (ref 0–44)
AST: 24 IU/L (ref 0–40)
Albumin: 4.2 g/dL (ref 3.8–4.8)
Alkaline Phosphatase: 103 IU/L (ref 44–121)
Bilirubin Total: 0.5 mg/dL (ref 0.0–1.2)
Bilirubin, Direct: 0.16 mg/dL (ref 0.00–0.40)
Total Protein: 6.9 g/dL (ref 6.0–8.5)

## 2022-12-26 ENCOUNTER — Telehealth: Payer: Self-pay

## 2022-12-26 DIAGNOSIS — I700 Atherosclerosis of aorta: Secondary | ICD-10-CM

## 2022-12-26 DIAGNOSIS — E785 Hyperlipidemia, unspecified: Secondary | ICD-10-CM

## 2022-12-26 MED ORDER — ROSUVASTATIN CALCIUM 40 MG PO TABS: 40.0000 mg | ORAL_TABLET | Freq: Every day | ORAL | 3 refills | Status: DC

## 2022-12-26 NOTE — Telephone Encounter (Signed)
The patient has been notified of the result and verbalized understanding.  All questions (if any) were answered. Frutoso Schatz, RN 12/26/2022 5:07 PM  Prescription has been updated. Labs scheduled.

## 2022-12-26 NOTE — Telephone Encounter (Signed)
-----   Message from Creig Hines, NP sent at 12/26/2022  9:05 AM EDT ----- LDL cholesterol 80, down only slightly from 83 in March, when switched to rosuvastatin 20mg .  Liver enzymes are now normal.  Recommend increasing rosuvastatin to 40mg  daily w/ f/u lipids and lfts in 8 wks.

## 2023-01-05 NOTE — Progress Notes (Signed)
Cardiology Office Note:    Date:  01/06/2023  ID:  Howard [name], DOB 1951-03-03, MRN [id number] PCP: Joaquim Nam, MD  [facility] Providers Cardiologist:  Donato Schultz, MD Cardiology APP:  Beatrice Lecher, PA-C          Patient Profile:   Hypertension  Thoracic aortic aneurysm CT Jul 21: 4.2 cm  CT 8/22: 4 cm CT 4/24: 4.1 cm Coronary artery disease  Myoview 02/16/2018: No ischemia, EF 50 CCTA 11/24/2022: Ascending aorta dilation 4.1 cm; CAC score 1364 (95th percentile), moderate CAD (proximal RCA 25-49, proximal-mid LAD 50-69, LCx calcifications >> FFR normal-med Rx Atrial fibrillation - noted on stress test in 2019 TTE 02/20/20: EF 60-65, no RWMA, mild LVH, GR 1 DD, normal RVSF, trivial MR, mild dilation of aortic root and ascending aorta (41 mm)  Monitor 02/22/2018: NSR, freq PACs, brief atrial runs Aortic atherosclerosis  Carotid US 04/16/2019: No significant stenosis bilaterally ABIs 08/11/2017: Normal Hepatic steatosis (? LFTs)/NASH       History of Present Illness:   Howard [name] is a 72 y.o. male who returns for follow-up of CAD.  He was last seen 11/18/2022.  He was noting symptoms of chest discomfort.  Coronary CTA was obtained and demonstrated moderate nonobstructive CAD.  FFR was normal.  He was placed on Imdur for medical management.  He returns for follow-up.  He is here alone.  Since last seen, he has been doing well without recurrent chest discomfort.  He took isosorbide once and developed a severe headache.  He has since stopped it.  He has not had shortness of breath, syncope, or edema.  ROS See the HPI    Studies Reviewed:    EKG: Not done   Risk Assessment/Calculations:             Physical Exam:   VS:  BP 118/68   Pulse 76   Ht 5\' 9"  (1.753 m)   Wt 202 lb 3.2 oz (91.7 kg)   SpO2 96%   BMI 29.86 kg/m    Wt Readings from Last 3 Encounters:  01/06/23 202 lb 3.2 oz (91.7 kg)  12/15/22 204 lb (92.5 kg)  11/18/22 207 lb (93.9 kg)     Constitutional:      Appearance: Healthy appearance. Not in distress.  Pulmonary:     Breath sounds: Normal breath sounds. No wheezing. No rales.  Cardiovascular:     Normal rate. Regular rhythm.     Murmurs: There is no murmur.  Edema:    Peripheral edema absent.  Abdominal:     Palpations: Abdomen is soft.       ASSESSMENT AND PLAN:   CAD (coronary artery disease) Moderate nonobstructive disease by CCTA 11/2022.  FFR was normal.  He could not tolerate isosorbide secondary to headache.  He has not had recurrent chest symptoms.  His blood pressure is well-controlled.  We discussed the importance of aggressive risk factor management.  Since he is no longer having chest symptoms, I do not think we need to add any further medications.  Continue aspirin 81 mg daily, Toprol-XL 25 mg daily, Crestor 40 mg daily.  If he has chest discomfort in the future, consider adding amlodipine.  Follow-up in 6 months.  Essential hypertension The patient's blood pressure is controlled on his current regimen.  Continue current therapy.   Hyperlipidemia LDL goal <70 LDL in May 2024 was above goal at 80.  He is now on higher dose rosuvastatin.  Continue rosuvastatin  40 mg daily.  Follow-up lipids pending in July.  If LDL remains above 70, consider adding ezetimibe versus referral to lipid clinic.  Thoracic ascending aortic aneurysm (HCC) 4.1 cm on recent CT.  Continue annual surveillance.      Dispo:  Return in about 6 months (around 07/09/2023) for Routine Follow Up, w/ Tereso Newcomer, PA-C.  Signed, Tereso Newcomer, PA-C

## 2023-01-06 ENCOUNTER — Ambulatory Visit: Payer: Medicare Other | Attending: Physician Assistant | Admitting: Physician Assistant

## 2023-01-06 ENCOUNTER — Encounter: Payer: Self-pay | Admitting: Physician Assistant

## 2023-01-06 VITALS — BP 118/68 | HR 76 | Ht 69.0 in | Wt 202.2 lb

## 2023-01-06 DIAGNOSIS — I2510 Atherosclerotic heart disease of native coronary artery without angina pectoris: Secondary | ICD-10-CM | POA: Diagnosis not present

## 2023-01-06 DIAGNOSIS — I7121 Aneurysm of the ascending aorta, without rupture: Secondary | ICD-10-CM | POA: Diagnosis not present

## 2023-01-06 DIAGNOSIS — I10 Essential (primary) hypertension: Secondary | ICD-10-CM

## 2023-01-06 DIAGNOSIS — E785 Hyperlipidemia, unspecified: Secondary | ICD-10-CM

## 2023-01-06 NOTE — Assessment & Plan Note (Signed)
The patient's blood pressure is controlled on his current regimen.  Continue current therapy.  

## 2023-01-06 NOTE — Assessment & Plan Note (Signed)
LDL in May 2024 was above goal at 80.  He is now on higher dose rosuvastatin.  Continue rosuvastatin 40 mg daily.  Follow-up lipids pending in July.  If LDL remains above 70, consider adding ezetimibe versus referral to lipid clinic.

## 2023-01-06 NOTE — Patient Instructions (Signed)
Medication Instructions:  Your physician recommends that you continue on your current medications as directed. Please refer to the Current Medication list given to you today.  *If you need a refill on your cardiac medications before your next appointment, please call your pharmacy*   Follow-Up: At [facility], you and your health needs are our priority.  As part of our continuing mission to provide you with exceptional heart care, we have created designated Provider Care Teams.  These Care Teams include your primary Cardiologist (physician) and Advanced Practice Providers (APPs -  Physician Assistants and Nurse Practitioners) who all work together to provide you with the care you need, when you need it.  We recommend signing up for the patient portal called "MyChart".  Sign up information is provided on this After Visit Summary.  MyChart is used to connect with patients for Virtual Visits (Telemedicine).  Patients are able to view lab/test results, encounter notes, upcoming appointments, etc.  Non-urgent messages can be sent to your provider as well.   To learn more about what you can do with MyChart, go to [email or web address].    Your next appointment:   6 month(s)  Provider:   Tereso Newcomer PA

## 2023-01-06 NOTE — Assessment & Plan Note (Signed)
4.1 cm on recent CT.  Continue annual surveillance.

## 2023-01-06 NOTE — Assessment & Plan Note (Signed)
Moderate nonobstructive disease by CCTA 11/2022.  FFR was normal.  He could not tolerate isosorbide secondary to headache.  He has not had recurrent chest symptoms.  His blood pressure is well-controlled.  We discussed the importance of aggressive risk factor management.  Since he is no longer having chest symptoms, I do not think we need to add any further medications.  Continue aspirin 81 mg daily, Toprol-XL 25 mg daily, Crestor 40 mg daily.  If he has chest discomfort in the future, consider adding amlodipine.  Follow-up in 6 months.

## 2023-02-09 ENCOUNTER — Other Ambulatory Visit: Payer: Self-pay | Admitting: Family Medicine

## 2023-02-09 NOTE — Telephone Encounter (Signed)
LVM for patient to c/b and schedule.  

## 2023-02-09 NOTE — Telephone Encounter (Signed)
Metoprolol refill sent for 90 days. Pt needs AWV parts 1 and 2. Please help pt get scheduled for both. Labs also prior to appt with Dr Para March.

## 2023-02-28 ENCOUNTER — Telehealth: Payer: Self-pay | Admitting: Physician Assistant

## 2023-02-28 NOTE — Telephone Encounter (Signed)
Pt called to report that he thought that Lorin Picket was going to send him in a new RX when he saw him last 12/2022 when he could not tolerate the Imdur due to headache.   He says that he has some had chest pain on occasion and can be at rest or with exertion and only lasts a few minutes but does bother him some.. he denies SOB and no dizziness. He says it happens a few times a week. He is unsure of his BP and HR.   Per Scott's last note:   Moderate nonobstructive disease by CCTA 11/2022.  FFR was normal.  He could not tolerate isosorbide secondary to headache.  He has not had recurrent chest symptoms.  His blood pressure is well-controlled.  We discussed the importance of aggressive risk factor management.  Since he is no longer having chest symptoms, I do not think we need to add any further medications.  Continue aspirin 81 mg daily, Toprol-XL 25 mg daily, Crestor 40 mg daily.  If he has chest discomfort in the future, consider adding amlodipine.  Follow-up in 6 months.     I will forward to [facility] for his review.

## 2023-02-28 NOTE — Telephone Encounter (Signed)
Start Amlodipine 2.5 mg once daily. Pt should follow up sooner if Amlodipine is not helping. Tereso Newcomer, PA-C    02/28/2023 5:50 PM

## 2023-02-28 NOTE — Telephone Encounter (Signed)
Pt states Scott, [place] told him he would call him in another medication for CP but he never heard anything back. Please advise.

## 2023-03-01 MED ORDER — AMLODIPINE BESYLATE 2.5 MG PO TABS: 2.5000 mg | ORAL_TABLET | Freq: Every day | ORAL | 3 refills | Status: DC

## 2023-03-01 NOTE — Telephone Encounter (Signed)
Returned call to pt and he has been made aware to start Amlodipine 2.5 mg taking 1 daily and if he feels it's not helping, to call us back and we will get him a sooner appointment.  Pt verbalized understanding.

## 2023-03-02 ENCOUNTER — Ambulatory Visit: Payer: Medicare Other

## 2023-03-02 ENCOUNTER — Encounter: Payer: Self-pay | Admitting: Internal Medicine

## 2023-03-02 ENCOUNTER — Ambulatory Visit: Payer: Medicare Other | Admitting: Internal Medicine

## 2023-03-02 VITALS — BP 134/86 | HR 77 | Temp 97.9°F | Ht 69.0 in | Wt 202.0 lb

## 2023-03-02 DIAGNOSIS — M5416 Radiculopathy, lumbar region: Secondary | ICD-10-CM

## 2023-03-02 MED ORDER — PREDNISONE 20 MG PO TABS: 40.0000 mg | ORAL_TABLET | Freq: Every day | ORAL | 1 refills | Status: DC

## 2023-03-02 MED ORDER — TIZANIDINE HCL 2 MG PO TABS: 2.0000 mg | ORAL_TABLET | Freq: Three times a day (TID) | ORAL | 0 refills | Status: DC | PRN

## 2023-03-02 NOTE — Assessment & Plan Note (Signed)
Has sig back disease via MRI but nothing to suggest HNP Will try prednisone 40 x 3, 20 x 3 Tizanidine prn Can try topical diclofenac

## 2023-03-02 NOTE — Progress Notes (Signed)
Subjective:    Patient ID: Howard [name], male    DOB: [birth year], 72 y.o.   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 90 tablet 3   aspirin EC 81 MG tablet Take 1 tablet (81 mg total) by mouth daily. Swallow whole. 90 tablet 3   Cholecalciferol 125 MCG (5000 UT) capsule Take 5,000 Units by mouth daily.     metoprolol succinate (TOPROL-XL) 25 MG 24 hr tablet TAKE 1 TABLET (25 MG TOTAL) BY MOUTH DAILY. 90 tablet 0   Omeprazole-Sodium Bicarbonate (ZEGERID) 20-1100 MG CAPS capsule Take 1 capsule by mouth daily before breakfast.     rosuvastatin (CRESTOR) 40 MG tablet Take 1 tablet (40 mg total) by mouth daily. 90 tablet 3   No current facility-administered medications on file prior to visit.    Allergies  Allergen Reactions   Ibuprofen     "Eats a hole in the lining of his stomach"    Past Medical History:  Diagnosis Date   Anesthesia complication    prolonged sedation after colonoscopy   Aortic atherosclerosis (HCC)    CT 02/2020   Colon polyps    on colonoscopy 08/2010   ED (erectile dysfunction)    Essential hypertension 06/25/2008   Qualifier: Diagnosis of  By: Hetty Ely MD, Franne Grip    GERD (gastroesophageal reflux disease)    HYPERLIPIDEMIA 07/24/2008   Qualifier: Diagnosis of  By: Hetty Ely MD, Franne Grip    Hypersomnia 01/05/2017   Near syncope 03/14/2017   Neck pain 12/15/2011   OSA (obstructive sleep apnea) 11/13/2016   Dr.  Vassie Loll   Prostate cancer [facility])    s/p prostatectomy   Sleep apnea 2018   Thoracic ascending aortic aneurysm (HCC) 04/17/2017   Chest CTA 10/18: Aorta 4.1 cm// Echo 8/18:  - 4.3 cm. Aortic arch is 3.3 cm // CT 01/2019: aorta 3.8 cm; sinuses of Valsalva 4.5 cm // Chest CTA 7/21: TAA 4.2 cm, cholelithiasis, aortic atherosclerosis // Chest CTA 8/22: Ascending thoracic aorta 4 cm, coronary calcification, cholelithiasis, aortic atherosclerosis//Chest CTA 8/22: Ascending thoracic aorta 4 cm    Past Surgical History:  Procedure Laterality Date   COLONOSCOPY     ESOPHAGOGASTRODUODENOSCOPY  10/02/2000   dilated reflux esopsh hh   [facility]: Dehydration; vasvagal sync  12/09/ 05/2003   PLANTAR FASCIA SURGERY Bilateral    POLYPECTOMY     ROBOT ASSISTED LAPAROSCOPIC RADICAL PROSTATECTOMY N/A 05/27/2013   Procedure: ROBOTIC ASSISTED LAPAROSCOPIC RADICAL PROSTATECTOMY LEVEL 1;  Surgeon: Crecencio Mc, MD;  Location: [facility];  Service: Urology;  Laterality: N/A;   UPPER GASTROINTESTINAL ENDOSCOPY      Family History  Problem Relation Age of Onset   Stroke Mother    Prostate cancer Neg Hx    Colon cancer Neg Hx    Aortic dissection Neg Hx    Esophageal cancer Neg Hx    Colon polyps Neg Hx    Rectal cancer Neg Hx  Stomach cancer Neg Hx     Social History   Socioeconomic History   Marital status: Married    Spouse name: Not on file   Number of children: 3   Years of education: 12   Highest education level: Not on file  Occupational History   Occupation: Retired    Comment: Hospital doctor Dept  Tobacco Use   Smoking status: Never   Smokeless tobacco: Never  Vaping Use   Vaping status: Never Used  Substance and Sexual Activity   Alcohol use: No    Alcohol/week: 0.0 standard drinks of alcohol    Comment: no   Drug use: No   Sexual activity: Not on file  Other Topics Concern   Not on file  Social History Narrative   Retired [employer], parttime bailiff- laid off as of  2018.    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     Married 1976, lives with wife. 1 step daughter, 2 daughters and 1 son.   [place] [employer] fan   Left handed   Social Determinants of Health   Financial Resource Strain: Low Risk  (11/17/2021)   Overall Financial Resource Strain (CARDIA)    Difficulty of Paying Living Expenses: Not hard at all  Food Insecurity: Low Risk  (09/01/2022)   [facility]   Food vital sign    Within the past 12 months, you worried that your food would run out before you got money to buy more: Never true    Within the past 12 months, the food you bought just didn't last and you didn't have money to get more. : Never true  Transportation Needs: Not on file (09/01/2022)  Physical Activity: Insufficiently Active (11/17/2021)   Exercise Vital Sign    Days of Exercise per Week: 3 days    Minutes of Exercise per Session: 40 min  Stress: No Stress Concern Present (11/17/2021)   [employer] of Occupational Health - Occupational Stress Questionnaire    Feeling of Stress : Not at all  Social Connections: Moderately Integrated (11/17/2021)   Social Connection and Isolation Panel [NHANES]    Frequency of Communication with Friends and Family: More than three times a week    Frequency of Social Gatherings with Friends and Family: More than three times a week    Attends Religious Services: More than 4 times per year    Active Member of [employer] or Organizations: No    Attends Banker Meetings: Never    Marital Status: Married  Catering manager Violence: Not At Risk (11/17/2021)   Humiliation, Afraid, Rape, and Kick questionnaire    Fear of Current or Ex-Partner: No    Emotionally Abused: No    Physically Abused: No    Sexually Abused: No   Review of Systems No groin sensory changes No loss of bowel or bladder control    Objective:   Physical Exam Constitutional:      Appearance: Normal appearance.  Musculoskeletal:     Comments: No spine  tenderness SLR negative Normal ROM in left hip   Neurological:     Mental Status: He is alert.     Comments: Antalgic gait Normal strength in legs            Assessment & Plan:

## 2023-03-02 NOTE — Patient Instructions (Signed)
You can try over the counter diclofenac gel on the painful area of your back as well as the prescriptions.

## 2023-03-03 ENCOUNTER — Telehealth: Payer: Self-pay | Admitting: Family Medicine

## 2023-03-03 NOTE — Telephone Encounter (Signed)
Left message on VM per DPR to take 2 tizanidine as needed. Also advised him of [facility] over the weekend if needed.

## 2023-03-03 NOTE — Telephone Encounter (Signed)
Patients was prescribed tiZANidine (ZANAFLEX) 2 MG tablet  for his back and hip pain.He called in stating that this medication isnt helping at all and would like to know if something else could be prescribed for him?  [id number] Judithann Sheen, [place] - [address] Phone: [phone]  Fax: [phone]

## 2023-03-03 NOTE — Telephone Encounter (Signed)
Patient returned call,I advised him of the dosage change of the medication.He understood and okayed everything.

## 2023-03-09 ENCOUNTER — Ambulatory Visit: Payer: Medicare Other

## 2023-03-11 ENCOUNTER — Other Ambulatory Visit: Payer: Self-pay | Admitting: Family Medicine

## 2023-04-04 ENCOUNTER — Ambulatory Visit (INDEPENDENT_AMBULATORY_CARE_PROVIDER_SITE_OTHER): Payer: Medicare Other | Admitting: Family Medicine

## 2023-04-04 ENCOUNTER — Encounter: Payer: Self-pay | Admitting: Family Medicine

## 2023-04-04 VITALS — BP 122/80 | HR 76 | Temp 97.6°F | Ht 69.0 in | Wt 195.0 lb

## 2023-04-04 DIAGNOSIS — H0289 Other specified disorders of eyelid: Secondary | ICD-10-CM | POA: Diagnosis not present

## 2023-04-04 DIAGNOSIS — M549 Dorsalgia, unspecified: Secondary | ICD-10-CM | POA: Diagnosis not present

## 2023-04-04 MED ORDER — POLYMYXIN B-TRIMETHOPRIM 10000-0.1 UNIT/ML-% OP SOLN
1.0000 [drp] | Freq: Four times a day (QID) | OPHTHALMIC | 0 refills | Status: DC
Start: 2023-04-04 — End: 2023-04-07

## 2023-04-04 NOTE — Patient Instructions (Addendum)
I will await your follow up notes from ortho.  Use the eye drops in the meantime.  Update [place] as needed.  Take care.  Glad to see you.  Schedule a yearly visit when possible after back pain is better.

## 2023-04-04 NOTE — Progress Notes (Unsigned)
He hurt his back at work and went to [facility].  He had MRI done per ortho and was told he has a bulging disc.  He had epidural injection about 2 weeks ago and it helped the pain but it didn't resolve.  He is going to go to PT and then reevaluate.  He is trying to avoid surgery.  He is taking tylenol.  He didn't tolerate gabapentin- it helped the pain but he had confusion/altered speech on med. That side effect resolved in the meantime with medication cessation.  No vision changes. No vision loss.    L eye symptoms.  Started yesterday.  Irritation on the eye/lid.  Upper lid swelling.  Some discharge.  Now this AM with white R eye discharge.  L ear feels stuffy.  No FCNAVD.  No rhinorrhea, no ST.  Not SOB.    Meds, vitals, and allergies reviewed.   ROS: Per HPI unless specifically indicated in ROS section   Nad Ncat except for  L upper eyelid swollen and puffy.   Minimal L eye injection.   Left lower lid not puffy. Right lids normal. No foreign body seen in either eye. TM wnl  Mmm OP wnl Neck supple no LA EOMI  PERRL

## 2023-04-05 DIAGNOSIS — H0289 Other specified disorders of eyelid: Secondary | ICD-10-CM | POA: Insufficient documentation

## 2023-04-05 NOTE — Assessment & Plan Note (Signed)
Will await Ortho follow-up notes.

## 2023-04-05 NOTE — Assessment & Plan Note (Signed)
Presumed/likely superficial bacterial infection without foreign body or trauma.  Discussed options.  Start Polytrim.  Update [place] as needed.  Okay for outpatient follow-up.

## 2023-04-07 ENCOUNTER — Ambulatory Visit: Payer: Medicare Other | Attending: Physician Assistant

## 2023-04-07 ENCOUNTER — Telehealth: Payer: Self-pay | Admitting: Family Medicine

## 2023-04-07 DIAGNOSIS — I700 Atherosclerosis of aorta: Secondary | ICD-10-CM

## 2023-04-07 DIAGNOSIS — E785 Hyperlipidemia, unspecified: Secondary | ICD-10-CM

## 2023-04-07 MED ORDER — POLYMYXIN B-TRIMETHOPRIM 10000-0.1 UNIT/ML-% OP SOLN: 1.0000 [drp] | Freq: Four times a day (QID) | OPHTHALMIC | 0 refills | Status: DC

## 2023-04-07 NOTE — Telephone Encounter (Signed)
Patient called in asking if the Howard [name] could  call his eye drops back in because he lost his eye drops the name of the eye drops is  trimethoprim-polymyxin b (POLYTRIM) ophthalmic solution.

## 2023-04-07 NOTE — Telephone Encounter (Signed)
Sent. Thanks.   

## 2023-04-08 LAB — HEPATIC FUNCTION PANEL
ALT: 13 IU/L (ref 0–44)
AST: 19 IU/L (ref 0–40)
Albumin: 4.1 g/dL (ref 3.8–4.8)
Alkaline Phosphatase: 118 IU/L (ref 44–121)
Bilirubin Total: 0.7 mg/dL (ref 0.0–1.2)
Bilirubin, Direct: 0.19 mg/dL (ref 0.00–0.40)
Total Protein: 6.7 g/dL (ref 6.0–8.5)

## 2023-04-08 LAB — LIPID PANEL
Chol/HDL Ratio: 2.7 ratio (ref 0.0–5.0)
Cholesterol, Total: 157 mg/dL (ref 100–199)
HDL: 58 mg/dL (ref >39)
LDL Chol Calc (NIH): 82 mg/dL (ref 0–99)
Triglycerides: 91 mg/dL (ref 0–149)
VLDL Cholesterol Cal: 17 mg/dL (ref 5–40)

## 2023-04-10 ENCOUNTER — Telehealth: Payer: Self-pay | Admitting: *Deleted

## 2023-04-10 DIAGNOSIS — E785 Hyperlipidemia, unspecified: Secondary | ICD-10-CM

## 2023-04-10 NOTE — Telephone Encounter (Signed)
-----   Message from Howard [name] sent at 04/09/2023  6:17 PM EDT ----- Results sent to Glennis Brink via MyChart. See MyChart comments below. PLAN:  -Refer to Pharm.D. lipid clinic for consideration of PCSK9 inhibitor  Mr. Gerilyn Pilgrim  Your LDL cholesterol remains above goal despite being on maximum dose rosuvastatin.  Your liver enzymes (AST, ALT) are normal.  I will refer you to our lipid clinic for consideration of alternative medications for cholesterol. Howard Newcomer, PA-C

## 2023-04-24 ENCOUNTER — Ambulatory Visit: Payer: Medicare Other | Attending: Cardiovascular Disease | Admitting: Pharmacist

## 2023-04-24 DIAGNOSIS — E785 Hyperlipidemia, unspecified: Secondary | ICD-10-CM

## 2023-04-24 MED ORDER — ROSUVASTATIN CALCIUM 40 MG PO TABS: 40.0000 mg | ORAL_TABLET | Freq: Every day | ORAL | 3 refills | Status: DC

## 2023-04-24 MED ORDER — NITROGLYCERIN 0.4 MG SL SUBL: 0.4000 mg | SUBLINGUAL_TABLET | SUBLINGUAL | 3 refills | Status: DC | PRN

## 2023-04-24 MED ORDER — EZETIMIBE 10 MG PO TABS: 10.0000 mg | ORAL_TABLET | Freq: Every day | ORAL | 3 refills | Status: DC

## 2023-04-24 NOTE — Patient Instructions (Addendum)
Your LDL cholesterol is 82 and your goal is < 70  Start taking ezetimibe (Zetia) 10mg  - 1 tablet once daily. This will lower your LDL cholesterol by an extra 20%  Continue taking your other medications including your rosuvastatin (Crestor)  We'll recheck fasting cholesterol on Wednesday, November 13 any time after 7:30am  We also discussed a twice monthly injection called Repatha that lowers your LDL by 60%

## 2023-04-24 NOTE — Progress Notes (Addendum)
Patient ID: Howard [name]                 DOB: [birth year]                    MRN: [id number]     HPI: Howard [name] is a 72 y.o. male patient referred to lipid clinic by Howard Newcomer, PA. PMH is significant for CAD with calcium score of 1364 (95th percentile), prox RCA 25-49% stenosis, prox-mid LAD 50-69% stenosis, LCx calcifications and normal FFR with recommendation for medication management, afib, and hepatic steatosis.  Pt presents today for follow up. Reports tolerating his medications well, reports med compliance. Reviewed heart healthy diet tips. Inquires about his amlodipine, has taken it once since rx was prescribed in mid July due to infrequent chest pain. Thought it was a prn med rather than daily. Given very infrequent symptoms, probably does not make sense to use regularly. Prior headache on Imdur. Reports he previously had rx for NTG a while back but none currently.  Current Medications: rosuvastatin 40mg  daily Intolerances: none Risk Factors: CAD LDL goal: 70mg /dL  Family History: Mother with prior stroke.  Social History: No alcohol, tobacco or drug use.  Labs: 04/07/23: TC 157, TG 91, HDL 58, LDL 82 - rosuvastatin 40mg  daily  Past Medical History:  Diagnosis Date   Anesthesia complication    prolonged sedation after colonoscopy   Aortic atherosclerosis (HCC)    CT 02/2020   Colon polyps    on colonoscopy 08/2010   ED (erectile dysfunction)    Essential hypertension 06/25/2008   Qualifier: Diagnosis of  By: Hetty Ely MD, Franne Grip    GERD (gastroesophageal reflux disease)    HYPERLIPIDEMIA 07/24/2008   Qualifier: Diagnosis of  By: Hetty Ely MD, Franne Grip    Hypersomnia 01/05/2017   Near syncope 03/14/2017   Neck pain 12/15/2011   OSA (obstructive sleep apnea) 11/13/2016   Dr. Vassie Loll   Prostate cancer [facility])    s/p prostatectomy   Sleep apnea 2018   Thoracic ascending aortic aneurysm (HCC) 04/17/2017   Chest CTA 10/18: Aorta 4.1 cm// Echo 8/18:  - 4.3  cm. Aortic arch is 3.3 cm // CT 01/2019: aorta 3.8 cm; sinuses of Valsalva 4.5 cm // Chest CTA 7/21: TAA 4.2 cm, cholelithiasis, aortic atherosclerosis // Chest CTA 8/22: Ascending thoracic aorta 4 cm, coronary calcification, cholelithiasis, aortic atherosclerosis//Chest CTA 8/22: Ascending thoracic aorta 4 cm    Current Outpatient Medications on File Prior to Visit  Medication Sig Dispense Refill   amLODipine (NORVASC) 2.5 MG tablet Take 1 tablet (2.5 mg total) by mouth daily. 90 tablet 3   aspirin EC 81 MG tablet Take 1 tablet (81 mg total) by mouth daily. Swallow whole. 90 tablet 3   Cholecalciferol 125 MCG (5000 UT) capsule Take 5,000 Units by mouth daily.     metoprolol succinate (TOPROL-XL) 25 MG 24 hr tablet TAKE 1 TABLET (25 MG TOTAL) BY MOUTH DAILY. 90 tablet 0   Omeprazole-Sodium Bicarbonate (ZEGERID) 20-1100 MG CAPS capsule Take 1 capsule by mouth daily before breakfast.     rosuvastatin (CRESTOR) 40 MG tablet Take 1 tablet (40 mg total) by mouth daily. 90 tablet 3   trimethoprim-polymyxin b (POLYTRIM) ophthalmic solution Place 1 drop into the left eye every 6 (six) hours. 10 mL 0   No current facility-administered medications on file prior to visit.    Allergies  Allergen Reactions   Ibuprofen     "Eats a hole in the  lining of his stomach"   Gabapentin     confusion/altered speech on med    Assessment/Plan:  1. Hyperlipidemia - LDL currently 82 on rosuvastatin 40mg  daily, above goal < 70 due to hx of CAD. Reviewed options for lowering LDL cholesterol, including ezetimibe and PCSK9 inhibitors. Discussed efficacy, side effects and cost. Pt prefers to try ezetimibe. Will start ezetimibe 10mg  daily and continue rosuvastatin 40mg  daily. Will recheck lipids in November.  2. Chest pain - Very mild/infrequent. Pt thought his amlodipine prescribed in July was to use prn for chest pain, has used it once and it didn't help much. Episode was mild and resolved quickly. Discussed that  amlodipine is taken daily, however with infrequency of his symptoms probably does not need. Prior headache on Imdur. Reports he used to have rx for NTG but does not have currently. Confirmed with Howard [name] this is ok to send in with notation that pt would require sooner follow up if he finds he's needing to use it frequently. Left detailed message for pt with this info.  Howard [name], PharmD, BCACP, CPP [facility] [address] [place], [place] [address] Phone: [phone]; Fax: [phone] 04/24/2023 10:00 AM

## 2023-04-24 NOTE — Addendum Note (Signed)
Addended by: Xzayvion Vaeth E on: 04/24/2023 01:30 PM   Modules accepted: Orders

## 2023-04-26 ENCOUNTER — Ambulatory Visit
Admission: RE | Admit: 2023-04-26 | Discharge: 2023-04-26 | Disposition: A | Discharge status: Home / Self Care | Payer: Worker's Compensation | Source: Ambulatory Visit | Attending: Physician Assistant | Admitting: Physician Assistant

## 2023-04-26 ENCOUNTER — Other Ambulatory Visit (HOSPITAL_COMMUNITY): Payer: Self-pay | Admitting: Physician Assistant

## 2023-04-26 ENCOUNTER — Emergency Department
Admission: EM | Admit: 2023-04-26 | Discharge: 2023-04-26 | Disposition: A | Discharge status: Home / Self Care | Payer: Worker's Compensation | Attending: Emergency Medicine | Admitting: Emergency Medicine

## 2023-04-26 DIAGNOSIS — R2241 Localized swelling, mass and lump, right lower limb: Secondary | ICD-10-CM | POA: Insufficient documentation

## 2023-04-26 DIAGNOSIS — M79661 Pain in right lower leg: Secondary | ICD-10-CM | POA: Diagnosis present

## 2023-04-26 DIAGNOSIS — I824Z1 Acute embolism and thrombosis of unspecified deep veins of right distal lower extremity: Secondary | ICD-10-CM | POA: Insufficient documentation

## 2023-04-26 DIAGNOSIS — I82451 Acute embolism and thrombosis of right peroneal vein: Secondary | ICD-10-CM | POA: Insufficient documentation

## 2023-04-26 DIAGNOSIS — Z7901 Long term (current) use of anticoagulants: Secondary | ICD-10-CM | POA: Insufficient documentation

## 2023-04-26 MED ORDER — APIXABAN 5 MG PO TABS
10.0000 mg | ORAL_TABLET | Freq: Once | ORAL | Status: AC
  Administered 2023-04-26: 10 mg via ORAL
  Filled 2023-04-26: qty 2

## 2023-04-26 MED ORDER — APIXABAN (ELIQUIS) VTE STARTER PACK (10MG AND 5MG): ORAL_TABLET | ORAL | 0 refills | Status: DC

## 2023-04-26 NOTE — ED Provider Notes (Signed)
[facility] Provider Note    Event Date/Time   First MD Initiated Contact with Patient 04/26/23 1840     (approximate)   History   DVT   HPI  Howard [name] is a 72 y.o. male   Past medical history of no significant past medical history presents emergency department with a DVT diagnosed on outpatient imaging.  He has had calf pain for the last 1 week worsening and gotten ultrasound today that showed peroneal vein thrombosis and referred to the emergency department for further evaluation and treatment.  He denies any chest pain, shortness of breath and has had no history of bleeding.  He never had a blood clot before but he did injure his back couple months ago and has been less mobile than normal.  Independent Historian contributed to assessment above: His wife is at bedside to corroborate information past medical history as above  External Medical Documents Reviewed: DVT ultrasound obtained earlier today which showed peroneal vein thrombosis      Physical Exam   Triage Vital Signs: ED Triage Vitals  Encounter Vitals Group     BP 04/26/23 1724 (!) 173/103     Systolic BP Percentile --      Diastolic BP Percentile --      Pulse Rate 04/26/23 1724 77     Resp 04/26/23 1724 18     Temp 04/26/23 1724 98.1 F (36.7 C)     Temp src --      SpO2 04/26/23 1724 99 %     Weight 04/26/23 1725 195 lb (88.5 kg)     Height 04/26/23 1725 5\' 9"  (1.753 m)     Head Circumference --      Peak Flow --      Pain Score 04/26/23 1724 6     Pain Loc --      Pain Education --      Exclude from Growth Chart --     Most recent vital signs: Vitals:   04/26/23 1724 04/26/23 1927  BP: (!) 173/103 (!) 152/97  Pulse: 77 66  Resp: 18 18  Temp: 98.1 F (36.7 C)   SpO2: 99% 95%    General: Awake, no distress.  CV:  Good peripheral perfusion.  Resp:  Normal effort.  Abd:  No distention.  Other:  Right calf swelling and tenderness to palpation without pain  out of proportion or crepitus.  Neurovascular intact distally.  Ambulatory.  Vital signs largely unremarkable.   ED Results / Procedures / Treatments   Labs (all labs ordered are listed, but only abnormal results are displayed) Labs Reviewed - No data to display  PROCEDURES:  Critical Care performed: No  Procedures   MEDICATIONS ORDERED IN ED: Medications  apixaban (ELIQUIS) tablet 10 mg (10 mg Oral Given 04/26/23 1926)    IMPRESSION / MDM / ASSESSMENT AND PLAN / ED COURSE  I reviewed the triage vital signs and the nursing notes.                                Patient's presentation is most consistent with acute presentation with potential threat to life or bodily function.  Differential diagnosis includes, but is not limited to, DVT, PE   The patient is on the cardiac monitor to evaluate for evidence of arrhythmia and/or significant heart rate changes.  MDM:    Diagnosed DVT in the calf veins, will start  on Eliquis with first dose in the emergency department.  No chest pain or shortness of breath or hemodynamic compromise to suggest PE or massive PE.  Outpatient follow-up.       FINAL CLINICAL IMPRESSION(S) / ED DIAGNOSES   Final diagnoses:  DVT, lower extremity, distal, acute, right (HCC)     Rx / DC Orders   ED Discharge Orders          Ordered    APIXABAN (ELIQUIS) VTE STARTER PACK (10MG  AND 5MG )       Note to Pharmacy: If starter pack unavailable, substitute with seventy-four 5 mg apixaban tabs following the above SIG directions.   04/26/23 1918             Note:  This document was prepared using Dragon voice recognition software and may include unintentional dictation errors.    Pilar Jarvis, MD 04/27/23 0001

## 2023-04-26 NOTE — ED Triage Notes (Signed)
Pt sent to ED from imaging for DVT to right leg. Reports sx started this am. Takes ASA

## 2023-04-27 ENCOUNTER — Telehealth: Payer: Self-pay | Admitting: Cardiology

## 2023-04-27 ENCOUNTER — Telehealth: Payer: Self-pay | Admitting: Family Medicine

## 2023-04-27 NOTE — Telephone Encounter (Signed)
Pt called in and stated that he would like for provider to give him a call back at [phone] he states he has information for dr Para March

## 2023-04-27 NOTE — Telephone Encounter (Signed)
Lvm asking pt to call back. Need to get some details of reason for call.

## 2023-04-27 NOTE — Telephone Encounter (Signed)
Patient is requesting a call back from the nurse. Patient requested for me to inform the nurse "it is in reference to his heart".

## 2023-04-27 NOTE — Telephone Encounter (Signed)
Pt wants "Dr Alben Spittle" to know he was seen in the ED yesterday and has blood clots in his legs.  He was started on Eliquis.  His prescription is a 5 week supply.  He would like a call back if he needs to do anything else.  He was instructed at ED to f/u with his PCP.

## 2023-04-28 NOTE — Telephone Encounter (Signed)
Left message to return call to our office.  

## 2023-04-28 NOTE — Telephone Encounter (Signed)
H/o DVT with ER eval, please see what details you can get.  Thanks.

## 2023-04-28 NOTE — Telephone Encounter (Signed)
MyChart message sent to pt. Tereso Newcomer, PA-C    04/28/2023 11:05 AM

## 2023-05-01 ENCOUNTER — Encounter: Payer: Self-pay | Admitting: Family Medicine

## 2023-05-01 ENCOUNTER — Ambulatory Visit (INDEPENDENT_AMBULATORY_CARE_PROVIDER_SITE_OTHER): Payer: Medicare Other | Admitting: Family Medicine

## 2023-05-01 VITALS — BP 120/84 | HR 63 | Temp 97.7°F | Ht 69.0 in | Wt 188.0 lb

## 2023-05-01 DIAGNOSIS — Z125 Encounter for screening for malignant neoplasm of prostate: Secondary | ICD-10-CM

## 2023-05-01 DIAGNOSIS — I82409 Acute embolism and thrombosis of unspecified deep veins of unspecified lower extremity: Secondary | ICD-10-CM | POA: Diagnosis not present

## 2023-05-01 DIAGNOSIS — Z1211 Encounter for screening for malignant neoplasm of colon: Secondary | ICD-10-CM | POA: Diagnosis not present

## 2023-05-01 DIAGNOSIS — R634 Abnormal weight loss: Secondary | ICD-10-CM

## 2023-05-01 LAB — URINALYSIS, ROUTINE W REFLEX MICROSCOPIC
Bilirubin Urine: NEGATIVE
Ketones, ur: NEGATIVE
Leukocytes,Ua: NEGATIVE
Nitrite: NEGATIVE
Specific Gravity, Urine: 1.015 (ref 1.000–1.030)
Total Protein, Urine: NEGATIVE
Urine Glucose: NEGATIVE
Urobilinogen, UA: 4 — AB (ref 0.0–1.0)
pH: 6.5 (ref 5.0–8.0)

## 2023-05-01 LAB — COMPREHENSIVE METABOLIC PANEL
ALT: 8 U/L (ref 0–53)
AST: 15 U/L (ref 0–37)
Albumin: 3.6 g/dL (ref 3.5–5.2)
Alkaline Phosphatase: 90 U/L (ref 39–117)
BUN: 12 mg/dL (ref 6–23)
CO2: 30 meq/L (ref 19–32)
Calcium: 9.4 mg/dL (ref 8.4–10.5)
Chloride: 101 meq/L (ref 96–112)
Creatinine, Ser: 1.16 mg/dL (ref 0.40–1.50)
GFR: 62.56 mL/min (ref >60.00)
Glucose, Bld: 120 mg/dL — ABNORMAL HIGH (ref 70–99)
Potassium: 4.5 mEq/L (ref 3.5–5.1)
Sodium: 138 meq/L (ref 135–145)
Total Bilirubin: 0.8 mg/dL (ref 0.2–1.2)
Total Protein: 6.5 g/dL (ref 6.0–8.3)

## 2023-05-01 LAB — CBC WITH DIFFERENTIAL/PLATELET
Basophils Absolute: 0.1 10*3/uL (ref 0.0–0.1)
Basophils Relative: 0.7 % (ref 0.0–3.0)
Eosinophils Absolute: 0.1 10*3/uL (ref 0.0–0.7)
Eosinophils Relative: 1 % (ref 0.0–5.0)
HCT: 42 % (ref 39.0–52.0)
Hemoglobin: 13.7 g/dL (ref 13.0–17.0)
Lymphocytes Relative: 24 % (ref 12.0–46.0)
Lymphs Abs: 2.1 10*3/uL (ref 0.7–4.0)
MCHC: 32.6 g/dL (ref 30.0–36.0)
MCV: 90.6 fl (ref 78.0–100.0)
Monocytes Absolute: 0.8 10*3/uL (ref 0.1–1.0)
Monocytes Relative: 9.3 % (ref 3.0–12.0)
Neutro Abs: 5.6 10*3/uL (ref 1.4–7.7)
Neutrophils Relative %: 65 % (ref 43.0–77.0)
Platelets: 283 10*3/uL (ref 150.0–400.0)
RBC: 4.63 Mil/uL (ref 4.22–5.81)
RDW: 14 % (ref 11.5–15.5)
WBC: 8.6 10*3/uL (ref 4.0–10.5)

## 2023-05-01 LAB — TSH: TSH: 2.22 u[IU]/mL (ref 0.35–5.50)

## 2023-05-01 LAB — PSA, MEDICARE: PSA: 0 ng/mL — ABNORMAL LOW (ref 0.10–4.00)

## 2023-05-01 NOTE — Telephone Encounter (Signed)
Patient was seen in office today with Dr. Para March; nothing further needed.

## 2023-05-01 NOTE — Patient Instructions (Signed)
Let me know when you are getting close to the end of your eliquis rx.  Go to the lab on the way out.   If you have mychart we'll likely use that to update you.    Take care.  Glad to see you.

## 2023-05-01 NOTE — Progress Notes (Unsigned)
Weight loss noted.    DVT upon ER eval.  On eliquis in the meantime, starter pack.  No bleeding. No h/o DVT.  Nsaid cautions d/w pt.  He had calf pain, u/s documented DVT.  He never had a blood clot before but he did injure his back couple months ago and has been less mobile than normal. No exertional CP.  R calf pain only.  Discussed taking tylenol for pain.    Dec in appetite noted recently.  No fevers, vomiting.  Some nausea.  No abd pain.  No blood in urine or stool.    Due for colonoscopy.  PSA zero in 2023.    No known FH DVT.    Meds, vitals, and allergies reviewed.   ROS: Per HPI unless specifically indicated in ROS section   Nad Ncat   L calf looks puffy but B 36 cm circumference.      Will need refill on eliquis?  Duration?  Imaging?

## 2023-05-03 ENCOUNTER — Other Ambulatory Visit: Payer: Self-pay | Admitting: Family Medicine

## 2023-05-03 ENCOUNTER — Other Ambulatory Visit (INDEPENDENT_AMBULATORY_CARE_PROVIDER_SITE_OTHER): Payer: Medicare Other

## 2023-05-03 ENCOUNTER — Other Ambulatory Visit: Payer: Self-pay | Admitting: Nurse Practitioner

## 2023-05-03 DIAGNOSIS — I82409 Acute embolism and thrombosis of unspecified deep veins of unspecified lower extremity: Secondary | ICD-10-CM | POA: Insufficient documentation

## 2023-05-03 DIAGNOSIS — Z1211 Encounter for screening for malignant neoplasm of colon: Secondary | ICD-10-CM

## 2023-05-03 DIAGNOSIS — R634 Abnormal weight loss: Secondary | ICD-10-CM | POA: Diagnosis not present

## 2023-05-03 DIAGNOSIS — Z125 Encounter for screening for malignant neoplasm of prostate: Secondary | ICD-10-CM

## 2023-05-03 DIAGNOSIS — K7581 Nonalcoholic steatohepatitis (NASH): Secondary | ICD-10-CM

## 2023-05-03 MED ORDER — APIXABAN 5 MG PO TABS
5.0000 mg | ORAL_TABLET | Freq: Two times a day (BID) | ORAL | 4 refills | Status: DC
Start: 2023-05-03 — End: 2023-07-28

## 2023-05-03 NOTE — Assessment & Plan Note (Signed)
Presumed to be a provoked DVT since he has been less mobile.  No other obvious contributing factor.  See notes on follow-up labs today.  History of prostate cancer noted.  Continue treatment with Eliquis in the meantime.  We can refill his medication once he finishes his starter pack.  I presume he will need 3-6 months of treatment, which could be influenced by his clinical course.  DVT pathophysiology discussed with patient.  No chest pain and okay for outpatient follow-up.  His history of weight loss is noted but it is unclear if that in any way contributes.  See notes on follow-up labs.

## 2023-05-04 ENCOUNTER — Telehealth: Payer: Self-pay

## 2023-05-04 LAB — FECAL OCCULT BLOOD, IMMUNOCHEMICAL: Fecal Occult Bld: NEGATIVE

## 2023-05-04 NOTE — Telephone Encounter (Signed)
Meryl Dare, MD  Illene Bolus, CMA No office visit. Change colonoscopy recall to April 2025 after he has finished Eliquis.       Previous Messages    ----- Message ----- From: Illene Bolus, CMA Sent: 05/04/2023  10:17 AM EDT To: Meryl Dare, MD  Good morning, I spoke with the patient and he said he was placed on Eliquis a week ago and will be on treatment for 6 months for blood clots. Do you still want me to schedule him an office visit now? Or wait until he is off anticoagulation? ----- Message ----- From: Meryl Dare, MD Sent: 05/04/2023   7:27 AM EDT To: Illene Bolus, CMA; Joaquim Nam, MD; Marchelle Folks, please contact Mr Tun to schedule an office appt for pre-colonoscopy evaluation on Eliquis. Thanks ----- Message ----- From: Joaquim Nam, MD Sent: 05/03/2023   9:21 PM EDT To: Meryl Dare, MD; Albertine Patricia  Please update patient.  Kidney and liver tests are fine.  Sugar was mildly elevated but he was not fasting.  Fortunately PSA is still 0.  He does have a small amount of blood in his urine.  His blood counts are normal otherwise.  Thyroid test is normal.  He is due for his follow-up colonoscopy.  Please have him check with the GI clinic to see when they want to proceed with that given his anticoagulation.  I routed this to Dr. Russella Dar for input in the meantime.  Please have him follow-up with urology since he did have a small amount of blood in his urine.  Let me know if he has trouble getting an appointment.  Please send a copy of his notes and labs to [facility].  I sent his refills on Eliquis to use when he is done with the starter pack.  I presume he will need a total of 6 months of treatment.  I sent another prescription with 4 refills.  That plus the starter pack will get him 6 months of treatment.  If it is profoundly expensive for the patient and have him let me know.  If he has any more weight loss in the meantime then let me know.   Thanks.

## 2023-05-04 NOTE — Telephone Encounter (Signed)
Recall entered for 11/2023.

## 2023-05-11 ENCOUNTER — Ambulatory Visit (INDEPENDENT_AMBULATORY_CARE_PROVIDER_SITE_OTHER): Payer: Medicare Other | Admitting: Family Medicine

## 2023-05-11 ENCOUNTER — Encounter: Payer: Self-pay | Admitting: Family Medicine

## 2023-05-11 VITALS — BP 120/78 | HR 72 | Temp 98.3°F | Ht 69.0 in | Wt 191.0 lb

## 2023-05-11 DIAGNOSIS — H0289 Other specified disorders of eyelid: Secondary | ICD-10-CM

## 2023-05-11 DIAGNOSIS — I82409 Acute embolism and thrombosis of unspecified deep veins of unspecified lower extremity: Secondary | ICD-10-CM

## 2023-05-11 DIAGNOSIS — Z23 Encounter for immunization: Secondary | ICD-10-CM | POA: Diagnosis not present

## 2023-05-11 DIAGNOSIS — Z0100 Encounter for examination of eyes and vision without abnormal findings: Secondary | ICD-10-CM

## 2023-05-11 MED ORDER — POLYMYXIN B-TRIMETHOPRIM 10000-0.1 UNIT/ML-% OP SOLN: 1.0000 [drp] | Freq: Four times a day (QID) | OPHTHALMIC | Status: DC

## 2023-05-11 NOTE — Patient Instructions (Addendum)
You should get a call about seeing Dr. Laruth Bouchard clinic.  Restart the drops in the meantime.  Update me as needed.   Plan on eliquis for 6 months total. Update me if you have any more weight loss or concerns.   Take care.  Glad to see you.

## 2023-05-11 NOTE — Progress Notes (Signed)
D/w pt about urology eval re: hematuria.  Has OV scheduled on 05/30/23.  Weight is up 3 lbs in the meantime, since last OV.  Appetite is better now.   Plan for 6 months anticoagulation.  Discussed plan for colonoscopy 11/2023.  It makes sense to defer this given ongoing anticoagulation.  Had a flu shot today.    L upper eyelid irritation prev resolved.  Sx returned and escalated.  Scant discharge now.  Had eye exam with [facility] recently.  He needs second opinion, even if current sx resolve.  Discussed.  Referral placed.  He has f/u pending re: his back.    Meds, vitals, and allergies reviewed.   ROS: Per HPI unless specifically indicated in ROS section   GEN: nad, alert and oriented HEENT: NCAT EOMI PERRL Mild irritation L upper eye lid.  Conjunctiva normal bilaterally without foreign body noted NECK: supple w/o LA CV: rrr.  SKIN: Well-perfused.

## 2023-05-14 NOTE — Assessment & Plan Note (Addendum)
Plan for 6 months of anticoagulation.  He can update me as needed.  D/w pt about urology eval re: hematuria.  Has OV scheduled on 05/30/23.  Weight is up 3 lbs in the meantime, since last OV.  Appetite is better now.  He will update me if he has any more weight loss.

## 2023-05-14 NOTE — Assessment & Plan Note (Signed)
Restart Polytrim and refer to ophthalmology.  Okay for outpatient follow-up.  No vision loss.

## 2023-05-15 ENCOUNTER — Ambulatory Visit
Admission: RE | Admit: 2023-05-15 | Discharge: 2023-05-15 | Disposition: A | Discharge status: Home / Self Care | Payer: Medicare Other | Source: Ambulatory Visit | Attending: Nurse Practitioner | Admitting: Nurse Practitioner

## 2023-05-15 ENCOUNTER — Ambulatory Visit (INDEPENDENT_AMBULATORY_CARE_PROVIDER_SITE_OTHER): Payer: Medicare Other | Admitting: Family Medicine

## 2023-05-15 ENCOUNTER — Encounter: Payer: Self-pay | Admitting: Family Medicine

## 2023-05-15 VITALS — BP 134/86 | HR 62 | Temp 97.9°F | Ht 69.0 in | Wt 197.0 lb

## 2023-05-15 DIAGNOSIS — R3129 Other microscopic hematuria: Secondary | ICD-10-CM | POA: Diagnosis not present

## 2023-05-15 DIAGNOSIS — N4889 Other specified disorders of penis: Secondary | ICD-10-CM | POA: Diagnosis not present

## 2023-05-15 DIAGNOSIS — G8929 Other chronic pain: Secondary | ICD-10-CM | POA: Insufficient documentation

## 2023-05-15 DIAGNOSIS — K7581 Nonalcoholic steatohepatitis (NASH): Secondary | ICD-10-CM

## 2023-05-15 LAB — POC URINALSYSI DIPSTICK (AUTOMATED)
Bilirubin, UA: NEGATIVE
Blood, UA: POSITIVE
Glucose, UA: NEGATIVE
Ketones, UA: NEGATIVE
Leukocytes, UA: NEGATIVE
Nitrite, UA: NEGATIVE
Protein, UA: NEGATIVE
Spec Grav, UA: <=1.005 — AB (ref 1.010–1.025)
Urobilinogen, UA: 0.2 U/dL
pH, UA: 6.5 (ref 5.0–8.0)

## 2023-05-15 MED ORDER — NYSTATIN 100000 UNIT/GM EX CREA: 1.0000 | TOPICAL_CREAM | Freq: Two times a day (BID) | CUTANEOUS | 0 refills | Status: DC

## 2023-05-15 NOTE — Patient Instructions (Signed)
Can continue using tylenol ES 650 mg 2 tabs three times daily for pain.Start topical antifungal cream  twice daily.  Keep appt with urology for hematuria.  We will call with urine culture results

## 2023-05-15 NOTE — Progress Notes (Signed)
Patient ID: Howard [name], male    DOB: 03-10-1951, 72 y.o.   MRN: [id number]  This visit was conducted in person.  BP 134/86   Pulse 62   Temp 97.9 F (36.6 C) (Temporal)   Ht 5\' 9"  (1.753 m)   Wt 197 lb (89.4 kg)   SpO2 98%   BMI 29.09 kg/m    CC:  Chief Complaint  Patient presents with   Penis Pain    Head of penis is very painful for last couple days    Subjective:   HPI: Howard [name] is a 72 y.o. male  pt of Dr. Lianne Bushy presenting on 05/15/2023 for Penis Pain (Head of penis is very painful for last couple days)   New onset pain in penis head, has had ild occ in past now in last 2 days.. severe.  No redness, no rash. No white discharge. No joint pain/joint issues.   He is circumcised.  No blood in urine.  No dysuria, no frequency, no urgency.  No fever.   Has been using tylenol for pain.   Was supposed to see [facility] for hematuria... OV scheduled 05/30/2023     Tried azo .Marland Kitchen Did not help.  Had similar earlier in year after drinking soda in 08/2022.  Since then slight irritation at base of glans.  Hx of  HTN, afib, CAD  Recent history of  DVT on anticoagulant.  Relevant past medical, surgical, family and social history reviewed and updated as indicated. Interim medical history since our last visit reviewed. Allergies and medications reviewed and updated. Outpatient Medications Prior to Visit  Medication Sig Dispense Refill   apixaban (ELIQUIS) 5 MG TABS tablet Take 1 tablet (5 mg total) by mouth 2 (two) times daily. Use after completion of starter pack. 60 tablet 4   Cholecalciferol 125 MCG (5000 UT) capsule Take 5,000 Units by mouth daily.     ezetimibe (ZETIA) 10 MG tablet Take 1 tablet (10 mg total) by mouth daily. 90 tablet 3   metoprolol succinate (TOPROL-XL) 25 MG 24 hr tablet TAKE 1 TABLET (25 MG TOTAL) BY MOUTH DAILY. 90 tablet 0   nitroGLYCERIN (NITROSTAT) 0.4 MG SL tablet Place 1 tablet (0.4 mg total) under the tongue every 5  (five) minutes as needed for chest pain. 25 tablet 3   Omeprazole-Sodium Bicarbonate (ZEGERID) 20-1100 MG CAPS capsule Take 1 capsule by mouth daily before breakfast.     rosuvastatin (CRESTOR) 40 MG tablet Take 1 tablet (40 mg total) by mouth daily. 90 tablet 3   trimethoprim-polymyxin b (POLYTRIM) ophthalmic solution Place 1 drop into the left eye every 6 (six) hours.     No facility-administered medications prior to visit.     Per HPI unless specifically indicated in ROS section below Review of Systems  Constitutional:  Negative for fatigue and fever.  HENT:  Negative for ear pain.   Eyes:  Negative for pain.  Respiratory:  Negative for cough and shortness of breath.   Cardiovascular:  Negative for chest pain, palpitations and leg swelling.  Gastrointestinal:  Negative for abdominal pain.  Genitourinary:  Negative for dysuria.  Musculoskeletal:  Negative for arthralgias.  Neurological:  Negative for syncope, light-headedness and headaches.  Psychiatric/Behavioral:  Negative for dysphoric mood.    Objective:  BP 134/86   Pulse 62   Temp 97.9 F (36.6 C) (Temporal)   Ht 5\' 9"  (1.753 m)   Wt 197 lb (89.4 kg)   SpO2 98%  BMI 29.09 kg/m   Wt Readings from Last 3 Encounters:  05/15/23 197 lb (89.4 kg)  05/11/23 191 lb (86.6 kg)  05/01/23 188 lb (85.3 kg)      Physical Exam Constitutional:      Appearance: He is well-developed.  HENT:     Head: Normocephalic.     Right Ear: Hearing normal.     Left Ear: Hearing normal.     Nose: Nose normal.  Neck:     Thyroid: No thyroid mass or thyromegaly.     Vascular: No carotid bruit.     Trachea: Trachea normal.  Cardiovascular:     Rate and Rhythm: Normal rate and regular rhythm.     Pulses: Normal pulses.     Heart sounds: Heart sounds not distant. No murmur heard.    No friction rub. No gallop.     Comments: No peripheral edema Pulmonary:     Effort: Pulmonary effort is normal. No respiratory distress.     Breath  sounds: Normal breath sounds.  Genitourinary:    Penis: Circumcised. Phimosis, erythema and tenderness present. No paraphimosis, hypospadias, discharge, swelling or lesions.      Comments: Small amount of erythema around base of glans  No ulcers, no discharge Skin:    General: Skin is warm and dry.     Findings: No rash.  Psychiatric:        Speech: Speech normal.        Behavior: Behavior normal.        Thought Content: Thought content normal.       Results for orders placed or performed in visit on 05/15/23  POCT Urinalysis Dipstick (Automated)  Result Value Ref Range   Color, UA yellow    Clarity, UA clear    Glucose, UA Negative Negative   Bilirubin, UA neg    Ketones, UA neg    Spec Grav, UA <=1.005 (A) 1.010 - 1.025   Blood, UA pos    pH, UA 6.5 5.0 - 8.0   Protein, UA Negative Negative   Urobilinogen, UA 0.2 0.2 or 1.0 E.U./dL   Nitrite, UA neg    Leukocytes, UA Negative Negative    Assessment and Plan  Penile pain Assessment & Plan: Acute, most consistent with candidal infection.  Less likely bacterial infection or allergic reaction.  No evidence of balanitis or paraphimosis. Will treat with nystatin cream twice daily. If not improving he does have upcoming appointment with urology.  Orders: -     POCT Urinalysis Dipstick (Automated)  Other microscopic hematuria Assessment & Plan: Chronic, has no signs and symptoms currently of urinary tract infection but will make sure no ongoing urinary tract infection with urine culture. Has upcoming appointment with urology in 2 weeks for hematuria workup.   Other orders -     Nystatin; Apply 1 Application topically 2 (two) times daily.  Dispense: 30 g; Refill: 0    No follow-ups on file.   Kerby Nora, MD

## 2023-05-15 NOTE — Assessment & Plan Note (Signed)
Chronic, has no signs and symptoms currently of urinary tract infection but will make sure no ongoing urinary tract infection with urine culture. Has upcoming appointment with urology in 2 weeks for hematuria workup.

## 2023-05-15 NOTE — Assessment & Plan Note (Signed)
Acute, most consistent with candidal infection.  Less likely bacterial infection or allergic reaction.  No evidence of balanitis or paraphimosis. Will treat with nystatin cream twice daily. If not improving he does have upcoming appointment with urology.

## 2023-05-24 ENCOUNTER — Encounter: Payer: Self-pay | Admitting: *Deleted

## 2023-05-31 ENCOUNTER — Other Ambulatory Visit: Payer: Self-pay | Admitting: Family Medicine

## 2023-05-31 NOTE — Telephone Encounter (Signed)
Called left message to see if patient requested refill or if auto refill. If so is he having symptoms?

## 2023-06-01 NOTE — Telephone Encounter (Signed)
Called patient left message to call office. To get more information. Patient has not called back. Ok to give refill or do you want me to keep calling?

## 2023-06-01 NOTE — Telephone Encounter (Signed)
I sent the refill in the meantime but please try to get update on patient. Thanks.

## 2023-06-02 NOTE — Telephone Encounter (Signed)
Left message on VM asking pt to call or send a MyChart message with an update.

## 2023-06-05 NOTE — Telephone Encounter (Signed)
Yes, thanks

## 2023-06-05 NOTE — Telephone Encounter (Signed)
We have called patient x 3 no call back. Do you want [place] to mail letter?

## 2023-06-28 ENCOUNTER — Ambulatory Visit: Payer: Medicare Other | Attending: Pharmacist

## 2023-06-28 DIAGNOSIS — E785 Hyperlipidemia, unspecified: Secondary | ICD-10-CM

## 2023-06-29 LAB — HEPATIC FUNCTION PANEL
ALT: 10 [IU]/L (ref 0–44)
AST: 23 [IU]/L (ref 0–40)
Albumin: 4.1 g/dL (ref 3.8–4.8)
Alkaline Phosphatase: 99 [IU]/L (ref 44–121)
Bilirubin Total: 0.5 mg/dL (ref 0.0–1.2)
Bilirubin, Direct: 0.2 mg/dL (ref 0.00–0.40)
Total Protein: 6.6 g/dL (ref 6.0–8.5)

## 2023-06-29 LAB — LIPID PANEL
Chol/HDL Ratio: 2.7 ratio (ref 0.0–5.0)
Cholesterol, Total: 126 mg/dL (ref 100–199)
HDL: 46 mg/dL (ref >39)
LDL Chol Calc (NIH): 63 mg/dL (ref 0–99)
Triglycerides: 87 mg/dL (ref 0–149)
VLDL Cholesterol Cal: 17 mg/dL (ref 5–40)

## 2023-07-05 ENCOUNTER — Encounter: Payer: Self-pay | Admitting: Physician Assistant

## 2023-07-05 ENCOUNTER — Ambulatory Visit: Payer: Medicare Other | Attending: Physician Assistant | Admitting: Physician Assistant

## 2023-07-05 VITALS — BP 140/100 | HR 69 | Ht 69.0 in | Wt 196.0 lb

## 2023-07-05 DIAGNOSIS — I82409 Acute embolism and thrombosis of unspecified deep veins of unspecified lower extremity: Secondary | ICD-10-CM

## 2023-07-05 DIAGNOSIS — I10 Essential (primary) hypertension: Secondary | ICD-10-CM | POA: Diagnosis not present

## 2023-07-05 DIAGNOSIS — I7121 Aneurysm of the ascending aorta, without rupture: Secondary | ICD-10-CM

## 2023-07-05 DIAGNOSIS — I2510 Atherosclerotic heart disease of native coronary artery without angina pectoris: Secondary | ICD-10-CM | POA: Diagnosis not present

## 2023-07-05 DIAGNOSIS — E785 Hyperlipidemia, unspecified: Secondary | ICD-10-CM

## 2023-07-05 NOTE — Assessment & Plan Note (Signed)
Thoracic aortic aneurysm measuring 4.1 cm on CT in April 2024. Emphasized importance of blood pressure control to prevent aneurysm progression. - Continue annual imaging surveillance - Maintain well-controlled blood pressure

## 2023-07-05 NOTE — Patient Instructions (Signed)
Medication Instructions:  Hold: Ezetimibe (Zetia) for 2-4 weeks; if symptoms improve/get better, then call office.  If symptoms do not get better then restart medication.   Change: Metoprolol succinate (Toprol-XL) 25 mg to once daily in the mornings.  *If you need a refill on your cardiac medications before your next appointment, please call your pharmacy*   Lab Work: None  Testing/Procedures: None  Follow-Up: At [facility], you and your health needs are our priority.  As part of our continuing mission to provide you with exceptional heart care, we have created designated Provider Care Teams.  These Care Teams include your primary Cardiologist (physician) and Advanced Practice Providers (APPs -  Physician Assistants and Nurse Practitioners) who all work together to provide you with the care you need, when you need it.  Your next appointment:   6 month(s)  Provider:   Tereso Newcomer, PA-C        Other Instructions Please keep a daily blood pressure log for one week and call us with your numbers.   HOW TO TAKE YOUR BLOOD PRESSURE: Rest 10-15 minutes before taking your blood pressure. Don't smoke or drink caffeinated beverages for at least 30 minutes before. Take your blood pressure before (not after) you eat. Sit comfortably with your back supported and both feet on the floor (don't cross your legs). Elevate your arm to heart level on a table or a desk. Use the proper sized cuff. It should fit smoothly and snugly around your bare upper arm. There should be enough room to slip a fingertip under the cuff. The bottom edge of the cuff should be 1 inch above the crease of the elbow.

## 2023-07-05 NOTE — Progress Notes (Signed)
Cardiology Office Note:    Date:  07/05/2023  ID:  Howard [name], DOB 12/03/1950, MRN [id number] PCP: Joaquim Nam, MD  [facility] Providers Cardiologist:  Donato Schultz, MD Cardiology APP:  Beatrice Lecher, PA-C       Patient Profile:      Hypertension  Thoracic aortic aneurysm CT Jul 21: 4.2 cm  CT 8/22: 4 cm CT 4/24: 4.1 cm Coronary artery disease  Myoview 02/16/2018: No ischemia, EF 50 CCTA 11/24/2022: Ascending aorta dilation 4.1 cm; CAC score 1364 (95th percentile), moderate CAD (proximal RCA 25-49, proximal-mid LAD 50-69, LCx calcifications >> FFR normal-med Rx Atrial fibrillation - noted on stress test in 2019 TTE 02/20/20: EF 60-65, no RWMA, mild LVH, GR 1 DD, normal RVSF, trivial MR, mild dilation of aortic root and ascending aorta (41 mm)  Monitor 02/22/2018: NSR, freq PACs, brief atrial runs Aortic atherosclerosis  Carotid US 04/16/2019: No significant stenosis bilaterally ABIs 08/11/2017: Normal Hepatic steatosis (? LFTs)/NASH  R leg DVT 04/2023        History of Present Illness:  Discussed the use of AI scribe software for clinical note transcription with the patient, who gave verbal consent to proceed.  Howard [name] is a 72 y.o. male who returns for follow-up of CAD, thoracic aortic aneurysm.  He was last seen in May 2024.  He was diagnosed with right lower extremity DVT in September 2024 and placed on Eliquis for anticoagulation. He is here alone. Since last seen, he had a back injury, conjunctivitis, and COVID-19 infection, which led to a period of increased sedentary behavior. Inactivity was likely the cause of his DVT (provoked). The patient reports a penile rash, which has been present for several months. He has his PCP and urologist for this issue and has tried various creams without improvement. He questions whether this could be a reaction to ezetimibe, which was started around the same time the rash appeared. He has occasional brief, fleeting  chest pain. He has nitroglycerin available but has not needed to use it. He denies any shortness of breath or syncope. He also reports forgetting to take his hypertension medication, metoprolol, at night occasionally.     Review of Systems  Gastrointestinal:  Negative for hematochezia and melena.  Genitourinary:  Negative for hematuria.  See HPI     Studies Reviewed:       Chart review-results 06/28/2023: ALT 10, LDL 63, HDL 46, triglycerides 87 05/01/2023: K+ 4.5, creatinine 1.16, ALT 8, Hgb 13.7 Results   LABS - Chart Review  HDL: 46 (06/28/2023) Total cholesterol: 126 (06/28/2023) LDL: 63 (06/28/2023) Triglycerides: 87 (06/28/2023) ALT: 10 (06/28/2023) Potassium: 4.5 (05/01/2023) Creatinine: 1.16 (05/01/2023) Hemoglobin: 13.7 (05/01/2023)      Risk Assessment/Calculations:     HYPERTENSION CONTROL Vitals:   07/05/23 1503 07/05/23 1544  BP: (!) 130/90 (!) 140/100    The patient's blood pressure is elevated above target today.  In order to address the patient's elevated BP: Blood pressure will be monitored at home to determine if medication changes need to be made.          Physical Exam:   VS:  BP (!) 140/100   Pulse 69   Ht 5\' 9"  (1.753 m)   Wt 196 lb (88.9 kg)   SpO2 95%   BMI 28.94 kg/m    Wt Readings from Last 3 Encounters:  07/05/23 196 lb (88.9 kg)  05/15/23 197 lb (89.4 kg)  05/11/23 191 lb (86.6 kg)  Constitutional:      Appearance: Healthy appearance. Not in distress.  Neck:     Vascular: JVD normal.  Pulmonary:     Breath sounds: Normal breath sounds. No wheezing. No rales.  Cardiovascular:     Normal rate. Regular rhythm.     Murmurs: There is no murmur.  Edema:    Peripheral edema absent.  Abdominal:     Palpations: Abdomen is soft.        Assessment and Plan:   Assessment & Plan Coronary artery disease involving native coronary artery of native heart without angina pectoris Moderate non-obstructive CAD confirmed by coronary CTA in  April 2024. Currently asymptomatic with no chest pain, pressure, or dyspnea. Occasional non-cardiac chest pain reported. On Eliquis for DVT,. Therefore, he is not on aspirin. Discussed nitroglycerin use for prolonged chest pain and need for further evaluation if symptoms worsen. - Continue Crestor 40 mg daily - Use nitroglycerin as needed for chest pain - Monitor for new or worsening symptoms Aneurysm of ascending aorta without rupture (HCC) Thoracic aortic aneurysm measuring 4.1 cm on CT in April 2024. Emphasized importance of blood pressure control to prevent aneurysm progression. - Continue annual imaging surveillance - Maintain well-controlled blood pressure Deep vein thrombosis (DVT) of lower extremity, unspecified chronicity, unspecified laterality, unspecified vein (HCC) Right lower extremity DVT diagnosed in September 2024. On Eliquis 5 mg twice daily. No bleeding or complications reported. DVT was likely provoked. Plan is to continue anticoag for a total of 6 mos Essential hypertension Blood pressure 130/90 mmHg today (140/100 on repeat), uncontrolled. Reports forgetting to take Toprol XL at night. He also notes high salt diet recently. Previous readings optimal. Discussed switching to morning dosing to improve adherence. Consider adding amlodipine if blood pressure remains above target. - Continue Toprol XL 25 mg daily - Switch Toprol XL to morning dosing - Monitor blood pressure daily for a week and report readings - Consider adding amlodipine if blood pressure remains above target - Limit sodium  Hyperlipidemia LDL goal <70 Optimal LDL levels on recent labs. Reports penile rash potentially related to Zetia, though drug eruption is unlikely. Rash evaluated by multiple providers. Discussed holding Zetia to see if rash resolves and considering PCSK9 inhibitor therapy if needed. - Continue Crestor 40 mg daily - Hold Zetia for 2-4 weeks to see if rash resolves - Consider PCSK9 inhibitor  therapy if rash resolves - Resume Zetia if rash does not resolve        Dispo:  Return in about 6 months (around 01/02/2024) for Routine Follow Up, w/ Tereso Newcomer, PA-C.  Signed, Tereso Newcomer, PA-C

## 2023-07-05 NOTE — Assessment & Plan Note (Signed)
Optimal LDL levels on recent labs. Reports penile rash potentially related to Zetia, though drug eruption is unlikely. Rash evaluated by multiple providers. Discussed holding Zetia to see if rash resolves and considering PCSK9 inhibitor therapy if needed. - Continue Crestor 40 mg daily - Hold Zetia for 2-4 weeks to see if rash resolves - Consider PCSK9 inhibitor therapy if rash resolves - Resume Zetia if rash does not resolve

## 2023-07-05 NOTE — Assessment & Plan Note (Signed)
Blood pressure 130/90 mmHg today (140/100 on repeat), uncontrolled. Reports forgetting to take Toprol XL at night. He also notes high salt diet recently. Previous readings optimal. Discussed switching to morning dosing to improve adherence. Consider adding amlodipine if blood pressure remains above target. - Continue Toprol XL 25 mg daily - Switch Toprol XL to morning dosing - Monitor blood pressure daily for a week and report readings - Consider adding amlodipine if blood pressure remains above target - Limit sodium

## 2023-07-05 NOTE — Assessment & Plan Note (Signed)
Moderate non-obstructive CAD confirmed by coronary CTA in April 2024. Currently asymptomatic with no chest pain, pressure, or dyspnea. Occasional non-cardiac chest pain reported. On Eliquis for DVT,. Therefore, he is not on aspirin. Discussed nitroglycerin use for prolonged chest pain and need for further evaluation if symptoms worsen. - Continue Crestor 40 mg daily - Use nitroglycerin as needed for chest pain - Monitor for new or worsening symptoms

## 2023-07-05 NOTE — Assessment & Plan Note (Signed)
Right lower extremity DVT diagnosed in September 2024. On Eliquis 5 mg twice daily. No bleeding or complications reported. DVT was likely provoked. Plan is to continue anticoag for a total of 6 mos

## 2023-07-10 ENCOUNTER — Telehealth: Payer: Self-pay | Admitting: Physician Assistant

## 2023-07-10 MED ORDER — AMLODIPINE BESYLATE 5 MG PO TABS: 5.0000 mg | ORAL_TABLET | Freq: Every day | ORAL | 3 refills | Status: DC

## 2023-07-10 NOTE — Telephone Encounter (Signed)
BP above goal. Start Amlodipine 5 mg once daily. Tereso Newcomer, PA-C    07/10/2023 2:33 PM

## 2023-07-10 NOTE — Telephone Encounter (Signed)
Call placed to pt.  He has been made aware to start Amlodipine 5 mg taking 1 daily.  Rx sent to [facility] per pt's request.

## 2023-07-10 NOTE — Telephone Encounter (Signed)
Pt states he would like to speak with a nurse. Please advise

## 2023-07-10 NOTE — Telephone Encounter (Signed)
Spoke with patient and he states in his last OV visit his BP was elevated. He was advised to monitor BP over the next five days. Below are the readings.  168/98 150/98 149/93 141/86

## 2023-07-25 ENCOUNTER — Ambulatory Visit (INDEPENDENT_AMBULATORY_CARE_PROVIDER_SITE_OTHER): Payer: Medicare Other | Admitting: Family Medicine

## 2023-07-25 ENCOUNTER — Encounter: Payer: Self-pay | Admitting: Family Medicine

## 2023-07-25 VITALS — BP 110/68 | HR 67 | Temp 98.2°F | Ht 69.0 in | Wt 191.5 lb

## 2023-07-25 DIAGNOSIS — R197 Diarrhea, unspecified: Secondary | ICD-10-CM | POA: Insufficient documentation

## 2023-07-25 DIAGNOSIS — K921 Melena: Secondary | ICD-10-CM | POA: Insufficient documentation

## 2023-07-25 NOTE — Progress Notes (Signed)
Patient ID: Howard [name], male    DOB: 03-02-1951, 72 y.o.   MRN: [id number]  This visit was conducted in person.  BP 110/68 (BP Location: Left Arm, Patient Position: Sitting, Cuff Size: Large)   Pulse 67   Temp 98.2 F (36.8 C) (Temporal)   Ht 5\' 9"  (1.753 m)   Wt 191 lb 8 oz (86.9 kg)   SpO2 95%   BMI 28.28 kg/m    CC:  Chief Complaint  Patient presents with   Diarrhea    X 4 days    Subjective:   HPI: Howard [name] is a 72 y.o. male  patient of Dr. Guy Begin on 07/25/2023 for Diarrhea (X 4 days)     New onset diarrhea in last 4 days. Stated after going to [facility], had big Mac...10 hours later had diarrhea. Every hour.  No N/V Has been drinking gatorade, pepto bismol, bland food like toast and soup.  In last  24 hr has had 30 oz liquids.  In last 24 hours has noted a black stool.... 2 BMs so far today. Watery, dark black today.  No abdominal pain.   Has had remote ulcer for ibuprofen ETOH use.  GERD.Marland Kitchen on Zegrid.  On Eliquis.   Relevant past medical, surgical, family and social history reviewed and updated as indicated. Interim medical history since our last visit reviewed. Allergies and medications reviewed and updated. Outpatient Medications Prior to Visit  Medication Sig Dispense Refill   amLODipine (NORVASC) 5 MG tablet Take 1 tablet (5 mg total) by mouth daily. 90 tablet 3   apixaban (ELIQUIS) 5 MG TABS tablet Take 1 tablet (5 mg total) by mouth 2 (two) times daily. Use after completion of starter pack. 60 tablet 4   Cholecalciferol 125 MCG (5000 UT) capsule Take 5,000 Units by mouth daily.     ezetimibe (ZETIA) 10 MG tablet Take 1 tablet (10 mg total) by mouth daily. 90 tablet 3   metoprolol succinate (TOPROL-XL) 25 MG 24 hr tablet TAKE 1 TABLET (25 MG TOTAL) BY MOUTH DAILY. 90 tablet 0   nitroGLYCERIN (NITROSTAT) 0.4 MG SL tablet Place 1 tablet (0.4 mg total) under the tongue every 5 (five) minutes as needed for chest pain. 25  tablet 3   Omeprazole-Sodium Bicarbonate (ZEGERID) 20-1100 MG CAPS capsule Take 1 capsule by mouth daily before breakfast.     rosuvastatin (CRESTOR) 40 MG tablet Take 1 tablet (40 mg total) by mouth daily. 90 tablet 3   No facility-administered medications prior to visit.     Per HPI unless specifically indicated in ROS section below Review of Systems  Constitutional:  Negative for fatigue and fever.  HENT:  Negative for ear pain.   Eyes:  Negative for pain.  Respiratory:  Negative for cough and shortness of breath.   Cardiovascular:  Negative for chest pain, palpitations and leg swelling.  Gastrointestinal:  Positive for diarrhea. Negative for abdominal pain.  Genitourinary:  Negative for dysuria.  Musculoskeletal:  Negative for arthralgias.  Neurological:  Negative for syncope, light-headedness and headaches.  Psychiatric/Behavioral:  Negative for dysphoric mood.    Objective:  BP 110/68 (BP Location: Left Arm, Patient Position: Sitting, Cuff Size: Large)   Pulse 67   Temp 98.2 F (36.8 C) (Temporal)   Ht 5\' 9"  (1.753 m)   Wt 191 lb 8 oz (86.9 kg)   SpO2 95%   BMI 28.28 kg/m   Wt Readings from Last 3 Encounters:  07/25/23 191 lb 8  oz (86.9 kg)  07/05/23 196 lb (88.9 kg)  05/15/23 197 lb (89.4 kg)      Physical Exam Vitals reviewed.  Constitutional:      Appearance: He is well-developed.  HENT:     Head: Normocephalic.     Right Ear: Hearing normal.     Left Ear: Hearing normal.     Nose: Nose normal.  Neck:     Thyroid: No thyroid mass or thyromegaly.     Vascular: No carotid bruit.     Trachea: Trachea normal.  Cardiovascular:     Rate and Rhythm: Normal rate and regular rhythm.     Pulses: Normal pulses.     Heart sounds: Heart sounds not distant. No murmur heard.    No friction rub. No gallop.     Comments: No peripheral edema Pulmonary:     Effort: Pulmonary effort is normal. No respiratory distress.     Breath sounds: Normal breath sounds.  Abdominal:      General: Abdomen is flat. Bowel sounds are increased. There is no distension.     Palpations: Abdomen is soft. There is no hepatomegaly or mass.     Tenderness: There is no abdominal tenderness. There is no right CVA tenderness or left CVA tenderness.     Hernia: No hernia is present.  Genitourinary:    Rectum: Guaiac result negative. No tenderness.  Skin:    General: Skin is warm and dry.     Findings: No rash.  Psychiatric:        Speech: Speech normal.        Behavior: Behavior normal.        Thought Content: Thought content normal.       Results for orders placed or performed in visit on 06/28/23  Lipid panel  Result Value Ref Range   Cholesterol, Total 126 100 - 199 mg/dL   Triglycerides 87 0 - 149 mg/dL   HDL 46 >41 mg/dL   VLDL Cholesterol Cal 17 5 - 40 mg/dL   LDL Chol Calc (NIH) 63 0 - 99 mg/dL   Chol/HDL Ratio 2.7 0.0 - 5.0 ratio  Hepatic function panel  Result Value Ref Range   Total Protein 6.6 6.0 - 8.5 g/dL   Albumin 4.1 3.8 - 4.8 g/dL   Bilirubin Total 0.5 0.0 - 1.2 mg/dL   Bilirubin, Direct 3.24 0.00 - 0.40 mg/dL   Alkaline Phosphatase 99 44 - 121 IU/L   AST 23 0 - 40 IU/L   ALT 10 0 - 44 IU/L    Assessment and Plan  There are no diagnoses linked to this encounter.  No follow-ups on file.   Kerby Nora, MD

## 2023-07-25 NOTE — Patient Instructions (Signed)
Push fluids, rest. Stop at labs to get container for stool sample.  Pepto Bismol likely causing dark stool.

## 2023-07-25 NOTE — Assessment & Plan Note (Addendum)
Acute, most likely in the viral gastroenteritis versus foodborne toxin. No evidence of dehydration, patient keeping up with p.o. intake.  Recommended continued hydration and bland diet with gradual advance in diet.  If diarrhea does not continue to improve as it has over time he will call for possible GI pathogen panel.  Return and ER precautions provided

## 2023-07-25 NOTE — Assessment & Plan Note (Signed)
On Eliquis.  Hemoccult card in office negative for blood.  Given no abdominal pain and dark stool started after Pepto-Bismol use, most likely secondary to Pepto-Bismol.  No clear sign and symptom of upper GI bleed.  Return and ER precautions provided

## 2023-07-28 ENCOUNTER — Telehealth: Payer: Self-pay

## 2023-07-28 ENCOUNTER — Ambulatory Visit (INDEPENDENT_AMBULATORY_CARE_PROVIDER_SITE_OTHER): Payer: Medicare Other | Admitting: Family Medicine

## 2023-07-28 ENCOUNTER — Encounter: Payer: Self-pay | Admitting: Family Medicine

## 2023-07-28 VITALS — BP 140/98 | HR 63 | Temp 98.0°F | Ht 69.0 in | Wt 187.4 lb

## 2023-07-28 DIAGNOSIS — R197 Diarrhea, unspecified: Secondary | ICD-10-CM

## 2023-07-28 DIAGNOSIS — I82409 Acute embolism and thrombosis of unspecified deep veins of unspecified lower extremity: Secondary | ICD-10-CM | POA: Diagnosis not present

## 2023-07-28 LAB — COMPREHENSIVE METABOLIC PANEL
ALT: 9 U/L (ref 0–53)
AST: 20 U/L (ref 0–37)
Albumin: 3.9 g/dL (ref 3.5–5.2)
Alkaline Phosphatase: 79 U/L (ref 39–117)
BUN: 14 mg/dL (ref 6–23)
CO2: 32 meq/L (ref 19–32)
Calcium: 9.8 mg/dL (ref 8.4–10.5)
Chloride: 99 meq/L (ref 96–112)
Creatinine, Ser: 1.26 mg/dL (ref 0.40–1.50)
GFR: 56.55 mL/min — ABNORMAL LOW (ref >60.00)
Glucose, Bld: 102 mg/dL — ABNORMAL HIGH (ref 70–99)
Potassium: 4.3 meq/L (ref 3.5–5.1)
Sodium: 139 meq/L (ref 135–145)
Total Bilirubin: 0.7 mg/dL (ref 0.2–1.2)
Total Protein: 6.6 g/dL (ref 6.0–8.3)

## 2023-07-28 LAB — CBC WITH DIFFERENTIAL/PLATELET
Basophils Absolute: 0 10*3/uL (ref 0.0–0.1)
Basophils Relative: 0.5 % (ref 0.0–3.0)
Eosinophils Absolute: 0.1 10*3/uL (ref 0.0–0.7)
Eosinophils Relative: 1.6 % (ref 0.0–5.0)
HCT: 43.8 % (ref 39.0–52.0)
Hemoglobin: 14.9 g/dL (ref 13.0–17.0)
Lymphocytes Relative: 32.3 % (ref 12.0–46.0)
Lymphs Abs: 2.5 10*3/uL (ref 0.7–4.0)
MCHC: 34.1 g/dL (ref 30.0–36.0)
MCV: 89.3 fL (ref 78.0–100.0)
Monocytes Absolute: 1.4 10*3/uL — ABNORMAL HIGH (ref 0.1–1.0)
Monocytes Relative: 18 % — ABNORMAL HIGH (ref 3.0–12.0)
Neutro Abs: 3.6 10*3/uL (ref 1.4–7.7)
Neutrophils Relative %: 47.6 % (ref 43.0–77.0)
Platelets: 248 10*3/uL (ref 150.0–400.0)
RBC: 4.91 Mil/uL (ref 4.22–5.81)
RDW: 13.7 % (ref 11.5–15.5)
WBC: 7.6 10*3/uL (ref 4.0–10.5)

## 2023-07-28 MED ORDER — LOPERAMIDE HCL 2 MG PO TABS: 2.0000 mg | ORAL_TABLET | Freq: Four times a day (QID) | ORAL | Status: DC | PRN

## 2023-07-28 MED ORDER — APIXABAN 5 MG PO TABS: 5.0000 mg | ORAL_TABLET | Freq: Two times a day (BID) | ORAL | 12 refills | Status: DC

## 2023-07-28 NOTE — Patient Instructions (Addendum)
Keep drinking enough water to keep your urine clear or light colored.  Go to the lab on the way out.   If you have mychart we'll likely use that to update you.    Use imodium if needed.  Take care.  Glad to see you.  Plan for taking eliquis though March.

## 2023-07-28 NOTE — Progress Notes (Unsigned)
   Care Guide Note  07/28/2023 Name: Howard [name] MRN: [id number] DOB: 21-Dec-1950  Referred by: Joaquim Nam, MD Reason for referral : Care Coordination (Outreach to schedule with Pharm d )   Howard [name] is a 72 y.o. year old male who is a primary care patient of Joaquim Nam, MD. Howard [name] was referred to the pharmacist for assistance related to  deep vein thrombosis .    An unsuccessful telephone outreach was attempted today to contact the patient who was referred to the pharmacy team for assistance with medication assistance. Additional attempts will be made to contact the patient.   Penne Lash , RMA     [facility]  [facility], [facility] Guide  Direct Dial: [phone]  Website: [email or web address]

## 2023-07-28 NOTE — Progress Notes (Unsigned)
Came back from [facility] 1 week ago.  He had a good trip.  No one else on the trip was sick.  Wife w/o sx.  Ate at [employer] PM 07/21/23, diarrhea started 07/22/23, continued for days.  Fecal urgency.  Dec appetite in the meantime.  Fatigued.    He had black stools prev (after taking pepto bismol) with neg FOBT at clinic on 07/25/23.  No black stools now.    Diarrhea is better today, brown and not black.  Frequency is getting better.  Last vomited ~2 days ago.  Still making urine, light yellow. Still with abd cramping.   He is some better than prior.  Orthostatic cautions d/w pt.   He needed refill on eliquis and needed help with cost.   Meds, vitals, and allergies reviewed.   ROS: Per HPI unless specifically indicated in ROS section

## 2023-07-30 NOTE — Assessment & Plan Note (Signed)
Improving, suspect viral gastroenteritis.  Would check routine labs today given his recent history.  Okay for outpatient follow-up.  Discussed gradually advancing his diet as tolerated and limiting dairy in the meantime.  Continue to drink plenty of fluids.

## 2023-07-30 NOTE — Assessment & Plan Note (Signed)
He needed refill on eliquis and needed help with cost.  Referral placed for medication assistance. Plan for taking eliquis though March.

## 2023-08-02 NOTE — Progress Notes (Unsigned)
Care Guide Pharmacy Note  08/02/2023 Name: Howard [name] MRN: [id number] DOB: 07-Feb-1951  Referred By: Joaquim Nam, MD Reason for referral: Care Coordination (Outreach to schedule with Pharm d )   Howard [name] is a 72 y.o. year old male who is a primary care patient of Joaquim Nam, MD.  Howard [name] was referred to the pharmacist for assistance related to:  deep vein thrombosis   A second unsuccessful telephone outreach was attempted today to contact the patient who was referred to the pharmacy team for assistance with medication assistance. Additional attempts will be made to contact the patient.  Penne Lash , RMA     [facility] Health  [facility], [facility] Guide  Direct Dial: [phone]  Website: [email or web address]

## 2023-08-03 NOTE — Progress Notes (Signed)
Care Guide Pharmacy Note  08/03/2023 Name: Howard [name] MRN: [id number] DOB: March 04, 1951  Referred By: Joaquim Nam, MD Reason for referral: Care Coordination (Outreach to schedule with Pharm d )   Howard [name] is a 72 y.o. year old male who is a primary care patient of Joaquim Nam, MD.  Howard [name] was referred to the pharmacist for assistance related to:  deep vein thrombosis   Successful contact was made with the patient to discuss pharmacy services including being ready for the pharmacist to call at least 5 minutes before the scheduled appointment time and to have medication bottles and any blood pressure readings ready for review. The patient agreed to meet with the pharmacist via telephone visit on (date/time).08/10/2023  Penne Lash , RMA     [place]  [facility], [facility] Guide  Direct Dial: [phone]  Website: [email or web address]

## 2023-08-10 ENCOUNTER — Other Ambulatory Visit: Payer: Medicare Other

## 2023-08-11 ENCOUNTER — Other Ambulatory Visit: Payer: Medicare Other

## 2023-08-11 ENCOUNTER — Encounter: Payer: Self-pay | Admitting: Pharmacist

## 2023-08-11 NOTE — Progress Notes (Deleted)
   08/11/2023 Name: Howard [name] MRN: [id number] DOB: 28-May-1951  Subjective  No chief complaint on file.   Care Team: Primary Care Provider: Joaquim Nam, MD  Reason for visit: ?  Howard [name] is a 72 y.o. male who presents today for a telephone visit with the pharmacist due to medication access concerns regarding their Eliquis. ?   Medication Access: ?  Prescription drug coverage: Payor: Advertising copywriter MEDICARE / Plan: [facility] MEDICARE / Product Type: *No Product type* / .   Reports that all medications are not affordable.  Copay has increased since entering the Coverage Gap (Donut hole).  Current Patient Assistance: {LZ MAP program list:31540}  Patient lives in a household of {NUMBERS:20191} {LZ MAP income choice:31503} via {Source of [phone]}.  Medicare LIS Eligible: {YES/NO:21197} {LZLISELIG2024:31094}  2024 Poverty Guidelines (all states except [place] and [place])    ? 100%  133%  150%  200%  250%  300%  400%  500%   1  $14,580  $19,391  $21,870  $29,160  $36,450  $43,740  $58,320  $72,900   2  $19,720  $26,228  $29,580  $39,440  $49,300  $59,160  $78,880  $98,600   3  $24,860  $33,064  $37,290  $49,720  $62,150  $74,580  $99,440  $124,300   4  $30,000  $39,900  $45,000  $60,000  $75,000  $90,000  $120,000  $150,000     Assessment and Plan:   1. Medication Access {LZ MAP program list:31540} patient portion of application filled out today and uploaded to Media Tab.  Provider page placed in PCP inbox with instruction to fax to MAP program upon signature and to scan into chart.  Patient forms: {LZ MAP income:31500} Income documentation: {LZ MAP income documents:31501} Patient is not eligible for copay cards due to government insurance. Will collaborate with CPhT team to facilitate MAP assistance for *** CPhT Med Access team cc'd for future tracking/correspondences.  Upon completion of signed forms, will fax full application to program and upload in  Media Tab. {LZ MAP Fax:31505}.  Future Appointments  Date Time Provider Department Center  08/11/2023  9:00 AM [facility] PHARMACIST [facility]    Loree Fee, PharmD Clinical Pharmacist [facility] [phone]

## 2023-08-11 NOTE — Progress Notes (Signed)
Attempted to contact patient for scheduled appointment for medication management (Cost concern - Eliqius).  Left HIPAA compliant message for patient to return my call at their convenience.  [employer].   Loree Fee, PharmD Clinical Pharmacist [facility] [phone]

## 2023-09-05 ENCOUNTER — Telehealth: Payer: Self-pay

## 2023-09-05 NOTE — Progress Notes (Signed)
Complex Care Management Care Guide Note  09/05/2023 Name: Polo Torchia MRN: [id number] DOB: [birth year]  Eldric Joachim is a 73 y.o. year old male who is a primary care patient of Joaquim Nam, MD and is actively engaged with the care management team. I reached out to Glennis Brink by phone today to assist with re-scheduling  with the Pharmacist.  Follow up plan: Unsuccessful telephone outreach attempt made. A HIPAA compliant phone message was left for the patient providing contact information and requesting a return call.  Penne Lash , RMA     [facility]  [facility], [facility] Guide  Direct Dial: [phone]  Website: [email or web address]

## 2023-09-13 NOTE — Progress Notes (Signed)
Complex Care Management Care Guide Note  09/13/2023 Name: Jamael Hoffmann MRN: [id number] DOB: 08-20-1950  Howard [name] is a 73 y.o. year old male who is a primary care patient of Joaquim Nam, MD and is actively engaged with the care management team. I reached out to Glennis Brink by phone today to assist with re-scheduling  with the Pharmacist.  Follow up plan: Unsuccessful telephone outreach attempt made. A HIPAA compliant phone message was left for the patient providing contact information and requesting a return call.  Penne Lash , RMA     [facility]  [facility], [facility] Guide  Direct Dial: [phone]  Website: [email or web address]

## 2023-09-19 NOTE — Progress Notes (Signed)
 Complex Care Management Care Guide Note  09/19/2023 Name: Howard [name] MRN: [id number] DOB: Jan 25, 1951  Howard [name] is a 73 y.o. year old male who is a primary care patient of Cleatus Arlyss RAMAN, MD and is actively engaged with the care management team. I reached out to Lynwood Sim Munroe by phone today to assist with re-scheduling  with the Pharmacist.  Follow up plan: Unsuccessful telephone outreach attempt made. A HIPAA compliant phone message was left for the patient providing contact information and requesting a return call.  Jeoffrey Buffalo , RMA     [facility]  [facility], [facility] Guide  Direct Dial: [phone]  Website: [email or web address]

## 2023-10-12 ENCOUNTER — Ambulatory Visit: Payer: Medicare Other | Admitting: Family Medicine

## 2023-10-19 ENCOUNTER — Ambulatory Visit: Payer: Self-pay | Admitting: Family Medicine

## 2023-10-19 NOTE — Telephone Encounter (Signed)
 Copied from CRM [id number]. Topic: Clinical - Red Word Triage >> Oct 19, 2023  4:03 PM [place] E wrote: [facility] that prompted transfer to Nurse Triage: Patient has been experiencing a burning sensation in his groin. Increased pain on head of penis. These symptoms have been going on for over a year, but pain has been increasing over the past 2 weeks.   Chief Complaint: Penis pain  Symptoms: Pain, some mild dysuria  Frequency: Intermittent   Disposition: [] ED /[] Urgent Care (no appt availability in office) / [x] Appointment(In office/virtual)/ []  [place] [facility]/ [] Home Care/ [] Refused Recommended Disposition /[] [place] [facility]/ []  Follow-up with PCP Additional Notes: Patient reports experiencing pain to the tip of his penis for about a year. He states that over the last 2 weeks his pain has worsened and states he now has pain in the shaft of his penis as well. Patient states he sometimes has mild pain with urination. He states he has been seen for this in the past but has not been able to find out what's causing his pain. Appointment made with Dr. Para March on Tuesday. Patient offered an appointment tomorrow but states he'd rather wait and see Dr. Para March.    Reason for Disposition  All other penis - scrotum symptoms  (Exception: Painless rash < 24 hours duration.)  Answer Assessment - Initial Assessment Questions 1. SYMPTOM: "What's the main symptom you're concerned about?" (e.g., discharge from penis, rash, pain, itching, swelling)     Pain 2. LOCATION: "Where is the pain located?"     Head and shaft of penis  3. ONSET: "When did the pain start?"     A year ago, worsened 2 weeks ago  4. PAIN: "Is there any pain?" If Yes, ask: "How bad is it?"  (Scale 1-10; or mild, moderate, severe)     Moderate  5. URINE: "Any difficulty passing urine?" If Yes, ask: "When was the last time?"     No 6. CAUSE: "What do you think is causing the symptoms?"     Unsure  7. OTHER SYMPTOMS: "Do you  have any other symptoms?" (e.g., fever, abdomen pain, blood in urine)     Mild pain with urination  Protocols used: Penis and Scrotum Symptoms-A-AH

## 2023-10-20 NOTE — Telephone Encounter (Signed)
 Can he get moved to a same day appointment on Monday?

## 2023-10-23 ENCOUNTER — Ambulatory Visit (INDEPENDENT_AMBULATORY_CARE_PROVIDER_SITE_OTHER): Admitting: Family Medicine

## 2023-10-23 ENCOUNTER — Encounter: Payer: Self-pay | Admitting: Family Medicine

## 2023-10-23 VITALS — BP 122/74 | HR 52 | Temp 98.0°F | Ht 69.0 in | Wt 197.8 lb

## 2023-10-23 DIAGNOSIS — I2510 Atherosclerotic heart disease of native coronary artery without angina pectoris: Secondary | ICD-10-CM | POA: Diagnosis not present

## 2023-10-23 DIAGNOSIS — N4889 Other specified disorders of penis: Secondary | ICD-10-CM | POA: Diagnosis not present

## 2023-10-23 DIAGNOSIS — I82409 Acute embolism and thrombosis of unspecified deep veins of unspecified lower extremity: Secondary | ICD-10-CM

## 2023-10-23 MED ORDER — PHENAZOPYRIDINE HCL 200 MG PO TABS: 200.0000 mg | ORAL_TABLET | Freq: Three times a day (TID) | ORAL | 0 refills | Status: DC | PRN

## 2023-10-23 NOTE — Progress Notes (Unsigned)
 He had hematuria eval per urology with neg work up.    Pain at the glans.  Can have pain along the shaft.   Pain with cough or blowing his nose.  Ibuprofen and tylenol didn't help.  Pain can last all day but is variable.  More pain with drinking lemonade or pepsi but not with voiding.  Didn't have pain with drinking milk or water.    D/w pt about eliquis.  Continued tx at this point. Plan to cease med at the end of the month, ie after 6 months tx. H/o single event with DVT.    H/o CAD seen on CT scan, d/w pt.  On statin and BP meds at baseline.   Meds, vitals, and allergies reviewed.   ROS: Per HPI unless specifically indicated in ROS section   Nad Ncat Neck supple, no LA Rrr Ctab Abd soft, not ttp

## 2023-10-23 NOTE — Patient Instructions (Addendum)
 Try pyridium and see if that helps.   Either way, let me know.  Take care.  Glad to see you.  Finish eliquis at the end of the month.

## 2023-10-23 NOTE — Telephone Encounter (Signed)
 Closing encounter. Patient scheduled to be seen 10/23/23

## 2023-10-24 ENCOUNTER — Ambulatory Visit: Admitting: Family Medicine

## 2023-10-25 NOTE — Assessment & Plan Note (Signed)
 He is already had urology workup.  Discussed.  I am uncertain about what would be the best option at this point. I think it is reasonable to retry pyridium and see if that helps.   Either way, he will let me know.  Noted that he had more pain drinking lemonade and Pepsi, discussed.

## 2023-10-25 NOTE — Assessment & Plan Note (Signed)
 Continue statin and BP meds at baseline.

## 2023-10-25 NOTE — Assessment & Plan Note (Signed)
 D/w pt about eliquis.  Continued tx at this point. Plan to cease med at the end of the month, ie after 6 months tx. H/o single event with DVT.

## 2023-10-26 ENCOUNTER — Telehealth: Payer: Self-pay | Admitting: Family Medicine

## 2023-10-26 NOTE — Telephone Encounter (Signed)
 I would take the med once daily as needed in the meantime, but not more often than that.   I would avoid trigger foods- he noted more symptoms with drinking acidic drinks.   I want to talk to urology in the meantime about options, given his improvement.  Thanks.

## 2023-10-26 NOTE — Telephone Encounter (Signed)
 Copied from CRM [id number]. Topic: Clinical - Medication Question >> Oct 26, 2023  8:50 AM Sim Boast F wrote: Reason for CRM: Patient seen Monday 10/23/23, called to let Dr. Para March that the Phenazopyridine did help with the pain and wants to know if he wants to see him again or prescribe another medication?

## 2023-10-27 ENCOUNTER — Other Ambulatory Visit: Payer: Self-pay | Admitting: Family Medicine

## 2023-10-27 MED ORDER — PHENAZOPYRIDINE HCL 200 MG PO TABS: 200.0000 mg | ORAL_TABLET | Freq: Every day | ORAL | 0 refills | Status: DC | PRN

## 2023-10-27 NOTE — Telephone Encounter (Signed)
 Left voicemail for patient to call the office back.

## 2023-10-27 NOTE — Telephone Encounter (Signed)
 I called asking for input from Dr. Laverle Patter and he is out of office today.  I will await return call from him.

## 2023-10-27 NOTE — Addendum Note (Signed)
 Addended by: Joaquim Nam on: 10/27/2023 01:49 PM   Modules accepted: Orders

## 2023-10-27 NOTE — Telephone Encounter (Signed)
 Patient came in office asking for refill to be called into his pharmacy

## 2023-10-27 NOTE — Telephone Encounter (Signed)
 Copied from CRM [id number]. Topic: Clinical - Medication Question >> Oct 27, 2023  8:29 AM Kathryne Eriksson wrote: Reason for CRM: phenazopyridine (PYRIDIUM) 200 MG tablet >> Oct 27, 2023  8:31 AM Kathryne Eriksson wrote: Patient received the noted information that Dr. Para March left for him, but patient wants to know rather or not the medication phenazopyridine (PYRIDIUM) 200 MG tablet was sent over to the pharmacy as he doesn't have anymore to continue taking. Patient is requesting a call back at [phone] to rather or not it has been sent over.

## 2023-10-27 NOTE — Telephone Encounter (Signed)
 LAST APPOINTMENT DATE: 10/23/2023   NEXT APPOINTMENT DATE:  Phenazopyridine (PYRIDIUM)  LAST REFILL: 10/23/23  QTY: #10 0rf   Pt states that he does not have anymore tablets. Contacted pharmacy. No refills on file.

## 2023-10-27 NOTE — Telephone Encounter (Signed)
 Sent. Thanks.

## 2023-10-31 NOTE — Telephone Encounter (Signed)
  I called asking for input from Dr. Laverle Patter, awaiting call back.

## 2023-11-02 ENCOUNTER — Telehealth: Payer: Self-pay | Admitting: Physician Assistant

## 2023-11-02 DIAGNOSIS — I7121 Aneurysm of the ascending aorta, without rupture: Secondary | ICD-10-CM

## 2023-11-02 NOTE — Telephone Encounter (Signed)
 Called  to make referral appt and patient said he was just seen at [facility] Dr. Laverle Patter and he did a chest scan. He wanted to know if that scan is ok to use.

## 2023-11-03 MED ORDER — PHENAZOPYRIDINE HCL 200 MG PO TABS
200.0000 mg | ORAL_TABLET | Freq: Every day | ORAL | 0 refills | Status: DC | PRN
Start: 2023-11-03 — End: 2023-11-14

## 2023-11-03 NOTE — Telephone Encounter (Signed)
 Late entry.  I talked to Dr. Laverle Patter on 11/02/2023.  If the patient has some relief with Pyridium then he can use that as needed in the meantime.  I would take the least amount possible, i.e. daily if needed but try to skip a day when possible.  I would try to avoid trigger foods such as acidic drinks.  Please see how he does over the next week or so and then update me about his situation.  I sent more Pyridium if he needs in the meantime.  Thanks.

## 2023-11-03 NOTE — Addendum Note (Signed)
 Addended by: Joaquim Nam on: 11/03/2023 06:48 AM   Modules accepted: Orders

## 2023-11-03 NOTE — Telephone Encounter (Signed)
 Called patient and left vm per DPR of Dr. Para March recommendations about taking the pyridium and things to avoid. Also advise medication has been sent

## 2023-11-06 NOTE — Telephone Encounter (Signed)
 Can you find out what type of scan he had? I don't see anything in his [facility] chart or CareEverywhere. Tereso Newcomer, PA-C    11/06/2023 4:36 PM

## 2023-11-07 ENCOUNTER — Encounter: Payer: Self-pay | Admitting: *Deleted

## 2023-11-07 NOTE — Telephone Encounter (Signed)
 Call placed to pt, left a message for pt to call back and also sent a mychart message.

## 2023-11-14 ENCOUNTER — Ambulatory Visit (INDEPENDENT_AMBULATORY_CARE_PROVIDER_SITE_OTHER): Admitting: Family Medicine

## 2023-11-14 ENCOUNTER — Encounter: Payer: Self-pay | Admitting: Family Medicine

## 2023-11-14 VITALS — BP 124/78 | HR 50 | Temp 97.5°F | Ht 69.0 in | Wt 197.2 lb

## 2023-11-14 DIAGNOSIS — R300 Dysuria: Secondary | ICD-10-CM | POA: Diagnosis not present

## 2023-11-14 MED ORDER — PHENAZOPYRIDINE HCL 200 MG PO TABS: ORAL_TABLET | ORAL | 1 refills | Status: DC

## 2023-11-14 NOTE — Progress Notes (Unsigned)
 Had been using pyridium prn.  It helped with pain in the glans.  He had typical urine color change.  He tried tapering down and didn't need med for about 4-5 days.  Burning at the glans was better for a few days.  He didn't have burning with urination at that point.  Then had more burning with urination and distal pain at the glans and he restarted med.  Pain in the glans is some better but still with some burning with urination.    He has been limiting acidic drinks in general.  Drinking mainly water.  He tried drinking watered down tea once or twice. No discharge.    He is done with anticoagulation.  No sensation of skipped beats.  Has cardiology f/u pending. D/w pt about GI eval.  See AVS.    Meds, vitals, and allergies reviewed.   ROS: Per HPI unless specifically indicated in ROS section

## 2023-11-14 NOTE — Patient Instructions (Addendum)
 Go to the lab on the way out.   If you have mychart we'll likely use that to update you.    Take care.  Glad to see you. I would take pyridium MWF at night.  Then take twice daily as needed in the meantime if you have burning.  Let me know how this goes.   Please call GI at [phone].

## 2023-11-15 LAB — URINE CULTURE
MICRO NUMBER:: 16273974
Result:: NO GROWTH
SPECIMEN QUALITY:: ADEQUATE

## 2023-11-15 NOTE — Assessment & Plan Note (Signed)
 See notes on labs. I would take pyridium MWF at night.  Then take twice daily as needed in the meantime if having burning.  He can let me know how this goes.

## 2023-11-27 ENCOUNTER — Encounter: Payer: Self-pay | Admitting: Family Medicine

## 2023-12-08 NOTE — Telephone Encounter (Signed)
 Spoke with pt. He will bring the CT results by the office next week so we can see what CT scan he had done and to see whether or not he needs to proceed with the CTA Aorta.

## 2023-12-17 NOTE — Telephone Encounter (Signed)
 CT that was done was of the abd and pelvis, which does not show the thoracic aorta. Since he just had this scan, we can do the chest aorta CTA in Aug or Sept. Please arrange for then. Marlyse Single, PA-C    12/17/2023 2:02 PM

## 2023-12-18 ENCOUNTER — Telehealth: Payer: Self-pay | Admitting: Cardiology

## 2023-12-18 NOTE — Telephone Encounter (Signed)
Patient states he was returning call. Please advise ?

## 2023-12-18 NOTE — Telephone Encounter (Signed)
 It would be ok to reschedule his follow up to after his CT if that is ok with him. Ask him if he can send us  some BP readings. I had adjusted his medication recently for elevated BP. Marlyse Single, PA-C    12/18/2023 4:21 PM

## 2023-12-18 NOTE — Telephone Encounter (Signed)
 Pt aware will need to repeat CTA Aorta in August or September.  Pt is asking if he still needed to keep his appt this month?  Will fwd to [facility], PA-C to advise.

## 2023-12-18 NOTE — Telephone Encounter (Signed)
 Pt is aware that we can push his appt until after his CT in August.  Scheduled him to see [facility] 03/23/24.  Pt will send in some bp readings.

## 2023-12-18 NOTE — Addendum Note (Signed)
 Addended by: Angelina Kempf on: 12/18/2023 08:54 AM   Modules accepted: Orders

## 2023-12-18 NOTE — Telephone Encounter (Signed)
 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

## 2023-12-18 NOTE — Telephone Encounter (Signed)
 Called pt reports received a message from [place] regarding CT scan.  Advised pt will send to [facility] to f/u.

## 2023-12-22 ENCOUNTER — Encounter: Payer: Self-pay | Admitting: Family Medicine

## 2023-12-22 ENCOUNTER — Ambulatory Visit (INDEPENDENT_AMBULATORY_CARE_PROVIDER_SITE_OTHER): Admitting: Family Medicine

## 2023-12-22 VITALS — BP 118/74 | HR 53 | Temp 98.5°F | Ht 69.0 in | Wt 194.4 lb

## 2023-12-22 DIAGNOSIS — N4889 Other specified disorders of penis: Secondary | ICD-10-CM | POA: Diagnosis not present

## 2023-12-22 DIAGNOSIS — M25569 Pain in unspecified knee: Secondary | ICD-10-CM

## 2023-12-22 MED ORDER — PREDNISONE 10 MG PO TABS: ORAL_TABLET | ORAL | 0 refills | Status: DC

## 2023-12-22 MED ORDER — PHENAZOPYRIDINE HCL 200 MG PO TABS: ORAL_TABLET | ORAL | Status: DC

## 2023-12-22 NOTE — Progress Notes (Unsigned)
 Wife had surgery for broken hip and elbow, needed temporary placement and is coming home today.   Had been taking pyridium , was able to go a few days w/o pain but then pain returned and had to restart medicine.  Burning with urination.  Pain along the penis.  No testicle pain.  He has some pudendal area pain.  He has some pain inc in the midst of straining for a BM. I need input from Dr. 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   Knee pain.  Not puffy but had pain for about 3-4 weeks. Not tolerant of nsaids.  No trauma.  No bruising.  Diffuse knee pain.    Meds, vitals, and allergies reviewed.   ROS: Per HPI unless specifically indicated in ROS section   Nad Ncat R medial knee joint line ttp with ROM Patella and knee not ttp o/w.  Crepitus on ROM.   Not bruised or puffy.    Discussed deferring x-ray of his knee as it would likely not change the plan.  He agreed.

## 2023-12-22 NOTE — Patient Instructions (Addendum)
 I'll check with Dr. 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   I would keep taking pyridium  in the meantime.   Try taking prednisone - Take 2 a day for 5 days, then 1 a day for 5 days, with food. Don't take with aleve/ibuprofen.  Take care.  Glad to see you.

## 2023-12-24 ENCOUNTER — Telehealth: Payer: Self-pay | Admitting: Family Medicine

## 2023-12-24 DIAGNOSIS — G588 Other specified mononeuropathies: Secondary | ICD-10-CM

## 2023-12-24 NOTE — Assessment & Plan Note (Signed)
 I need input from Dr. 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  He has not tolerated gabapentin.  I need urology input about a potential nerve block versus extra imaging versus other options.  Discussed with patient.  Would continue as needed Pyridium  in the meantime.

## 2023-12-24 NOTE — Telephone Encounter (Signed)
 Please call urology.  The clinic number is [phone].  I need input from Dr. 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  Please send copy of my office visit note.  Thanks.

## 2023-12-24 NOTE — Assessment & Plan Note (Signed)
 Likely osteoarthritis flare.  Steroid cautions discussed with patient. He can try taking prednisone - Take 2 a day for 5 days, then 1 a day for 5 days, with food. Don't take with aleve/ibuprofen.  He can also let me know if prednisone  affects his pelvic/penile pain at all.

## 2023-12-25 NOTE — Telephone Encounter (Signed)
 Left message for urology for Dr. Rozanne Corners also sent office notes with a note attached. Will attempt later

## 2023-12-27 NOTE — Telephone Encounter (Signed)
 Reached out to Dr. Eliane Grooms office and he did receive the fax of the office notes. Unfortunately, he is out of office until Tuesday. He will attempt to reach out to you then.

## 2023-12-27 NOTE — Telephone Encounter (Signed)
 Noted. Thanks.

## 2023-12-27 NOTE — Telephone Encounter (Signed)
 Please call pt.  I talked with Dr. Rozanne Corners.  My concern is that the patient has nerve irritation in his pelvis/lower spine that is causing his symptoms.  I cannot prove that that is the main issue right now, but that is my concern.  I talked with with Dr. Rozanne Corners about this and he agreed that it could be a possible cause.  We both think it makes sense to see either the spine clinic or a pain clinic to see if there is something that can be done (i.e. like a nerve block) to help with pain.  Please let me know if he consents for that and I can put in the order.  Thanks.  I greatly appreciate the help of all involved, especially Dr. Rozanne Corners.

## 2023-12-29 NOTE — Telephone Encounter (Signed)
 Left voicemail for patient to return call to office.

## 2024-01-01 MED ORDER — PREDNISONE 10 MG PO TABS: ORAL_TABLET | ORAL | 0 refills | Status: DC

## 2024-01-01 MED ORDER — PHENAZOPYRIDINE HCL 200 MG PO TABS: ORAL_TABLET | ORAL | 1 refills | Status: AC

## 2024-01-01 NOTE — Telephone Encounter (Signed)
 Patient consents to referral to either location.  He also states that the prednisone  and the pyridium  has helped a lot. He is not sure if another prescription can be sent for those in the meantime. Please advise.

## 2024-01-01 NOTE — Addendum Note (Signed)
 Addended by: Donnie Galea on: 01/01/2024 06:13 PM   Modules accepted: Orders

## 2024-01-01 NOTE — Telephone Encounter (Signed)
 I sent the refill for pyridium  and prednisone .  I put in the pain clinic referral.  I would hold the prednisone  for now and only use if he has recurrent symptoms.  Thanks.

## 2024-01-02 ENCOUNTER — Ambulatory Visit: Admitting: Physician Assistant

## 2024-01-02 NOTE — Telephone Encounter (Signed)
 Called and patient phone went to voicemail, message was left to call office back or check MyChart for results. MyChart message was also sent to patient.

## 2024-01-12 ENCOUNTER — Encounter: Payer: Self-pay | Admitting: Family Medicine

## 2024-01-12 ENCOUNTER — Ambulatory Visit (INDEPENDENT_AMBULATORY_CARE_PROVIDER_SITE_OTHER): Admitting: Family Medicine

## 2024-01-12 VITALS — BP 126/78 | HR 54 | Temp 98.2°F | Ht 69.0 in | Wt 197.4 lb

## 2024-01-12 DIAGNOSIS — H1189 Other specified disorders of conjunctiva: Secondary | ICD-10-CM | POA: Diagnosis not present

## 2024-01-12 DIAGNOSIS — G588 Other specified mononeuropathies: Secondary | ICD-10-CM | POA: Diagnosis not present

## 2024-01-12 MED ORDER — POLYMYXIN B-TRIMETHOPRIM 10000-0.1 UNIT/ML-% OP SOLN
1.0000 [drp] | OPHTHALMIC | 0 refills | Status: DC
Start: 2024-01-12 — End: 2024-05-14

## 2024-01-12 MED ORDER — PREDNISONE 10 MG PO TABS: ORAL_TABLET | ORAL | Status: DC

## 2024-01-12 NOTE — Telephone Encounter (Signed)
 I am checking on the status of a referral that Dr. Vallarie Gauze placed for patient to go to the pain clinic. He is in office today and has not heard anything. Can someone check on this please?

## 2024-01-12 NOTE — Progress Notes (Signed)
 Eye sx started last week.  L then R eye sx.  Patient states that both eyes have had some discharge and he thinks it may be pinkeye. Left eye at one time was sore to the touch. It is better now.  Not having ttp on L eyelids now.  Discharge is better now.  Prev L eye crusting now.  Last crusted about 2 days ago.    He has been taking prednisone  10mg  and 1 pyridium  daily with relief of pain.  He hasn't heard about pain clinic referral yet.  I sent a message asking for input on that.  Meds, vitals, and allergies reviewed.   ROS: Per HPI unless specifically indicated in ROS section   Nad Ncat Neck supple, no LA PERRL EOMI B.  No conjunctival injection or discharge.  No foreign body seen. Lids appear normal. MMM

## 2024-01-12 NOTE — Patient Instructions (Signed)
 If you have more eye discharge/redness then start the drops and update us  as needed.  I'll check on the pain clinic referral.  Take care.  Glad to see you.

## 2024-01-14 ENCOUNTER — Telehealth: Payer: Self-pay | Admitting: Family Medicine

## 2024-01-14 DIAGNOSIS — G588 Other specified mononeuropathies: Secondary | ICD-10-CM | POA: Insufficient documentation

## 2024-01-14 DIAGNOSIS — H1189 Other specified disorders of conjunctiva: Secondary | ICD-10-CM | POA: Insufficient documentation

## 2024-01-14 NOTE — Assessment & Plan Note (Signed)
 He has been taking prednisone  10mg  and 1 pyridium  daily with relief of pain.  Would continue as is.  He hasn't heard about pain clinic referral yet.  I sent a message asking for input on that.

## 2024-01-14 NOTE — Telephone Encounter (Signed)
 What is the status of his pain clinic referral?  Thanks.

## 2024-01-14 NOTE — Assessment & Plan Note (Signed)
 Reassuring exam.  Hold antibiotic treatment for now.  If more eye discharge/redness then start Polytrim  and update us  as needed.

## 2024-01-19 ENCOUNTER — Encounter: Payer: Self-pay | Admitting: Family Medicine

## 2024-01-19 ENCOUNTER — Ambulatory Visit (INDEPENDENT_AMBULATORY_CARE_PROVIDER_SITE_OTHER): Admitting: Family Medicine

## 2024-01-19 VITALS — BP 116/74 | HR 59 | Temp 97.5°F | Ht 69.0 in | Wt 194.4 lb

## 2024-01-19 DIAGNOSIS — R252 Cramp and spasm: Secondary | ICD-10-CM | POA: Insufficient documentation

## 2024-01-19 MED ORDER — CYCLOBENZAPRINE HCL 10 MG PO TABS: 5.0000 mg | ORAL_TABLET | Freq: Every evening | ORAL | 0 refills | Status: DC | PRN

## 2024-01-19 NOTE — Telephone Encounter (Signed)
Thanks.  Please notify pt.   

## 2024-01-19 NOTE — Progress Notes (Signed)
 Patient ID: Howard [name], male    DOB: 12/21/1950, 73 y.o.   MRN: [id number]  This visit was conducted in person.  BP 116/74   Pulse (!) 59   Temp (!) 97.5 F (36.4 C) (Temporal)   Ht 5\' 9"  (1.753 m)   Wt 194 lb 6 oz (88.2 kg)   SpO2 96%   BMI 28.70 kg/m    CC:  Chief Complaint  Patient presents with   Cramps    Legs, Neck and Hands Taking Mustard and Leg Cramps + Arnica    Subjective:   HPI: Howard [name] is a 73 y.o. male presenting on 01/19/2024 for Cramps (Legs, Neck and Hands/Taking Mustard and Leg Cramps + Arnica)    2 weeks he has had muscle cramping..  worse in last week.  Severe in legs and feet in last week.  Trouble sleeping.  Today in neck  No new meds.     Has tried mustard and arnica tabs... helped only temporarily.  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  Eating bananas   On crestor  40 mg daily.. not new.   Relevant past medical, surgical, family and social history reviewed and updated as indicated. Interim medical history since our last visit reviewed. Allergies and medications reviewed and updated. Outpatient Medications Prior to Visit  Medication Sig Dispense Refill   amLODipine  (NORVASC ) 5 MG tablet Take 1 tablet (5 mg total) by mouth daily. 90 tablet 3   Cholecalciferol 125 MCG (5000 UT) capsule Take 5,000 Units by mouth daily.     ezetimibe  (ZETIA ) 10 MG tablet Take 1 tablet (10 mg total) by mouth daily. 90 tablet 3   metoprolol  succinate (TOPROL -XL) 25 MG 24 hr tablet TAKE 1 TABLET (25 MG TOTAL) BY MOUTH DAILY. 90 tablet 0   nitroGLYCERIN  (NITROSTAT ) 0.4 MG SL tablet Place 1 tablet (0.4 mg total) under the tongue every 5 (five) minutes as needed for chest pain. 25 tablet 3   Omeprazole -Sodium Bicarbonate (ZEGERID) 20-1100 MG CAPS capsule Take 1 capsule by mouth daily before breakfast.     phenazopyridine  (PYRIDIUM ) 200 MG tablet Take 1 tab 3 times a day if needed. 30 tablet 1    rosuvastatin  (CRESTOR ) 40 MG tablet Take 1 tablet (40 mg total) by mouth daily. 90 tablet 3   trimethoprim -polymyxin b  (POLYTRIM ) ophthalmic solution Place 1 drop into both eyes every 4 (four) hours. 10 mL 0   predniSONE  (DELTASONE ) 10 MG tablet Take 1 tab a day, with food. Don't take with aleve/ibuprofen.     No facility-administered medications prior to visit.     Per HPI unless specifically indicated in ROS section below Review of Systems  Constitutional:  Negative for fatigue and fever.  HENT:  Negative for ear pain.   Eyes:  Negative for pain.  Respiratory:  Negative for cough and shortness of breath.   Cardiovascular:  Negative for chest pain, palpitations and leg swelling.  Gastrointestinal:  Negative for abdominal pain.  Genitourinary:  Negative for dysuria.  Musculoskeletal:  Positive for myalgias. Negative for arthralgias.  Neurological:  Negative for syncope, light-headedness and headaches.  Psychiatric/Behavioral:  Negative for dysphoric mood.    Objective:  BP 116/74   Pulse (!) 59   Temp (!) 97.5 F (36.4 C) (Temporal)   Ht 5\' 9"  (1.753 m)   Wt 194 lb 6 oz (88.2 kg)   SpO2 96%   BMI 28.70 kg/m   Wt Readings from  Last 3 Encounters:  01/19/24 194 lb 6 oz (88.2 kg)  01/12/24 197 lb 6.4 oz (89.5 kg)  12/22/23 194 lb 6.4 oz (88.2 kg)      Physical Exam Vitals reviewed.  Constitutional:      Appearance: He is well-developed.  HENT:     Head: Normocephalic.     Right Ear: Hearing normal.     Left Ear: Hearing normal.     Nose: Nose normal.  Neck:     Thyroid : No thyroid  mass or thyromegaly.     Vascular: No carotid bruit.     Trachea: Trachea normal.  Cardiovascular:     Rate and Rhythm: Normal rate and regular rhythm.     Pulses: Normal pulses.     Heart sounds: Heart sounds not distant. No murmur heard.    No friction rub. No gallop.     Comments: No peripheral edema Pulmonary:     Effort: Pulmonary effort is normal. No respiratory distress.      Breath sounds: Normal breath sounds.  Skin:    General: Skin is warm and dry.     Findings: No rash.  Psychiatric:        Speech: Speech normal.        Behavior: Behavior normal.        Thought Content: Thought content normal.       Results for orders placed or performed in visit on 11/14/23  Urine Culture   Collection Time: 11/14/23 10:01 AM   Specimen: Urine  Result Value Ref Range   MICRO NUMBER: [id number]    SPECIMEN QUALITY: Adequate    Sample Source URINE    STATUS: FINAL    Result: No Growth     Assessment and Plan  There are no diagnoses linked to this encounter.  No follow-ups on file.   Herby Lolling, MD

## 2024-01-19 NOTE — Telephone Encounter (Signed)
 This referral was processed on 01/15/24 and sent Internally to [facility].  They had to review it to determine scheduling time frame.  As of today 01/19/24, they are working on getting consult appt set up.

## 2024-01-19 NOTE — Assessment & Plan Note (Signed)
 Acute, interfering with sleep at night.  Will evaluate with labs for secondary causes.  Patient well-hydrated and drinks 60 ounces of fluids per day. No excessive exercise. No new medications but patient is on high-dose Crestor .  This could potentially be causing irritation.  If labs are normal he can consider trial of coenzyme Q 10.  Given interference with sleep I have provided him with a muscle relaxant to try at night.  He can also use yellow mustard or tonic water  for possible idiopathic cramping.  Return and ER precautions provided

## 2024-01-20 LAB — CBC WITH DIFFERENTIAL/PLATELET
Absolute Lymphocytes: 2660 {cells}/uL (ref 850–3900)
Absolute Monocytes: 1010 {cells}/uL — ABNORMAL HIGH (ref 200–950)
Basophils Absolute: 60 {cells}/uL (ref 0–200)
Basophils Relative: 0.6 %
Eosinophils Absolute: 230 {cells}/uL (ref 15–500)
Eosinophils Relative: 2.3 %
HCT: 46 % (ref 38.5–50.0)
Hemoglobin: 14.9 g/dL (ref 13.2–17.1)
MCH: 29.6 pg (ref 27.0–33.0)
MCHC: 32.4 g/dL (ref 32.0–36.0)
MCV: 91.3 fL (ref 80.0–100.0)
MPV: 10.2 fL (ref 7.5–12.5)
Monocytes Relative: 10.1 %
Neutro Abs: 6040 {cells}/uL (ref 1500–7800)
Neutrophils Relative %: 60.4 %
Platelets: 190 10*3/uL (ref 140–400)
RBC: 5.04 10*6/uL (ref 4.20–5.80)
RDW: 12.8 % (ref 11.0–15.0)
Total Lymphocyte: 26.6 %
WBC: 10 10*3/uL (ref 3.8–10.8)

## 2024-01-20 LAB — BASIC METABOLIC PANEL WITH GFR
BUN/Creatinine Ratio: 13 (calc) (ref 6–22)
BUN: 20 mg/dL (ref 7–25)
CO2: 27 mmol/L (ref 20–32)
Calcium: 9.9 mg/dL (ref 8.6–10.3)
Chloride: 101 mmol/L (ref 98–110)
Creat: 1.54 mg/dL — ABNORMAL HIGH (ref 0.70–1.28)
Glucose, Bld: 93 mg/dL (ref 65–99)
Potassium: 5.2 mmol/L (ref 3.5–5.3)
Sodium: 139 mmol/L (ref 135–146)
eGFR: 47 mL/min/{1.73_m2} — ABNORMAL LOW (ref >=60)

## 2024-01-20 LAB — MAGNESIUM: Magnesium: 2.2 mg/dL (ref 1.5–2.5)

## 2024-01-20 LAB — TSH: TSH: 2.95 m[IU]/L (ref 0.40–4.50)

## 2024-01-20 LAB — CK: Total CK: 45 U/L (ref 19–278)

## 2024-01-23 NOTE — Telephone Encounter (Signed)
 I called and spoke with the patient. He is aware that his referral has already been processed and sent to [facility] and it is in the Review Process. He is aware that someone should contact him in the next 1-2 weeks to schedule.    A MyChart Letter with referral information was also sent to him on 01/02/2024.

## 2024-01-23 NOTE — Telephone Encounter (Signed)
 Noted. Thanks.

## 2024-01-24 ENCOUNTER — Ambulatory Visit: Payer: Self-pay | Admitting: Family Medicine

## 2024-02-20 DIAGNOSIS — M899 Disorder of bone, unspecified: Secondary | ICD-10-CM | POA: Insufficient documentation

## 2024-02-20 DIAGNOSIS — G894 Chronic pain syndrome: Secondary | ICD-10-CM | POA: Insufficient documentation

## 2024-02-20 DIAGNOSIS — Z79899 Other long term (current) drug therapy: Secondary | ICD-10-CM | POA: Insufficient documentation

## 2024-02-20 DIAGNOSIS — Z9079 Acquired absence of other genital organ(s): Secondary | ICD-10-CM | POA: Insufficient documentation

## 2024-02-20 DIAGNOSIS — Z8546 Personal history of malignant neoplasm of prostate: Secondary | ICD-10-CM | POA: Insufficient documentation

## 2024-02-20 DIAGNOSIS — R937 Abnormal findings on diagnostic imaging of other parts of musculoskeletal system: Secondary | ICD-10-CM | POA: Insufficient documentation

## 2024-02-20 DIAGNOSIS — Z789 Other specified health status: Secondary | ICD-10-CM | POA: Insufficient documentation

## 2024-02-20 NOTE — Progress Notes (Unsigned)
 PROVIDER NOTE: Interpretation of information contained herein should be left to medically-trained personnel. Specific patient instructions are provided elsewhere under Patient Instructions section of medical record. This document was created in part using AI and STT-dictation technology, any transcriptional errors that may result from this process are unintentional.  Patient: Howard [name]  Service: E/M Encounter  Provider: Eric DELENA Como, MD  DOB: 01-16-1951  Delivery: Face-to-face  Specialty: Interventional Pain Management  MRN: [id number]  Setting: Ambulatory outpatient facility  Specialty designation: 09  Type: New Patient  Location: Outpatient office facility  PCP: Cleatus Arlyss RAMAN, MD  DOS: 02/21/2024    Referring Prov.: Cleatus Arlyss RAMAN, MD   Primary Reason(s) for Visit: Encounter for initial evaluation of one or more chronic problems (new to examiner) potentially causing chronic pain, and posing a threat to normal musculoskeletal function. (Level of risk: High) CC: Peptic Ulcer Disease and Pain (Pudenal neuralgia)  HPI  Howard [name] is a 73 y.o. year old, male patient, who comes for the first time to our practice referred by Cleatus Arlyss RAMAN, MD for our initial evaluation of his chronic pain. He has Essential hypertension; URI (upper respiratory infection); ATOPIC RHINITIS; IMPOTENCE, ORGANIC ORIGIN; Prostate cancer (HCC); Back pain; Medicare annual wellness visit, subsequent; Hyperlipidemia LDL goal <70; Knee pain; Advance care planning; Health care maintenance; GERD (gastroesophageal reflux disease); Thoracic ascending aortic aneurysm (HCC); OSA (obstructive sleep apnea); Intermittent claudication (HCC); Metatarsalgia; Vitamin D  deficiency; Paroxysmal atrial fibrillation (HCC); Premature atrial beats; CAD (coronary artery disease); Meningioma (HCC); Aortic atherosclerosis (HCC); Gallstones; Creatinine elevation; Constipation; Elevated LFTs; Fatty liver; Urinary frequency; Dysuria; Pain in  left hand; Paresthesia; Precordial pain; Lumbar radiculopathy, right; Irritation of eyelid; DVT (deep venous thrombosis) (HCC); Chronic Penile pain; Other microscopic hematuria; Black stool; Pudendal neuralgia; Conjunctival irritation; Muscle cramp; Chronic pain syndrome; Pharmacologic therapy; Disorder of skeletal system; Problems influencing health status; History of prostatectomy; History of prostate cancer; Abnormal MRI, lumbar spine (05/24/2019); Neurogenic pain; Food intolerance in adult (triggering penile pain); and History of penile lesion on their problem list. Today he comes in for evaluation of his Peptic Ulcer Disease and Pain (Pudenal neuralgia)  Pain Assessment: Location:   Penis Radiating: denies Onset: More than a month ago Duration: Chronic pain Quality: Aching, Discomfort, Nagging Severity: 4 /10 (subjective, self-reported pain score)  Effect on ADL: more activity, sometimes clothing Timing: Constant Modifying factors: medications BP: 136/84  HR: (!) 54  Onset and Duration: Gradual and Present longer than 3 months Cause of pain: Unknown Severity: No change since onset, NAS-11 at its worse: 8/10, NAS-11 at its best: 5/10, NAS-11 now: 5/10, and NAS-11 on the average: 5/10 Timing: Morning and Afternoon Aggravating Factors: Motion Alleviating Factors: Resting and Sitting Associated Problems: No associated problems Quality of Pain: Aching, Annoying, Dreadful, Heavy, Horrible, Throbbing, Tingling, Toothache-like, and Uncomfortable Previous Examinations or Tests: CT scan and Neurological evaluation Previous Treatments: Narcotic medications and Steroid treatments by mouth  Howard [name] is being evaluated for possible interventional pain management therapies for the treatment of his chronic pain.  Discussed the use of AI scribe software for clinical note transcription with the patient, who gave verbal consent to proceed.  History of Present Illness   Howard [name] is a 73 year  old male who presents with chronic penile pain. He was referred by Dr. Abigail for evaluation of chronic penile pain.  He has been experiencing chronic pain localized to the head of the penis for approximately a year and a half. The pain typically begins  a few minutes after consuming certain beverages, such as Pepsi, coffee, caffeinated coffee, chocolate, and lemonade, and is described as an ache without associated redness, swelling, or tenderness. He also reports a burning sensation during urination that developed over time.  In October, a sore appeared on the side of the head of the penis, which was painful. He was prescribed a cream that did not initially help, but after increased hygiene and continued use, the sore resolved within a couple of weeks and has not recurred.  He has a history of prostate cancer, for which he underwent prostate removal approximately ten years ago. He has been evaluated by a specialist in [place], who could not find any abnormalities. A CT scan was performed, which did not reveal any significant findings.  He has been taking a medication that turns his urine red, yellow, and orange, which was prescribed to relieve urinary discomfort. He also took prednisone  for a couple of weeks, and reported that both medicines together helped. The pain does not disturb his sleep but begins upon urination in the morning and persists throughout the day, although it can fluctuate in intensity.  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      Howard [name] has been informed that this initial visit was an evaluation only.  On the follow up appointment I will go over the results, including ordered tests and available interventional therapies. At that time he will have the opportunity to decide whether to proceed with offered therapies or not. In the event that Howard [name] prefers avoiding  interventional options, this will conclude our involvement in the case.  Medication management recommendations may be provided upon request.  Patient informed that diagnostic tests may be ordered to assist in identifying underlying causes, narrow the list of differential diagnoses and aid in determining candidacy for (or contraindications to) planned therapeutic interventions.  Historic Controlled Substance Pharmacotherapy Review PMP and historical list of controlled substances: Gabapentin 300 mg capsule, 1 cap p.o. twice daily (60/month) (# 60) (last filled on 06/02/2023); tramadol  50 mg tablet, 1 tab p.o. 4 times daily (# 20) (last filled on 03/10/2023); oxycodone  IR 5 mg tablet (# 10) (last filled on 05/07/2022) Most recently prescribed controlled substance(s): Opioid Analgesic: None MME/day: 0 mg/day  Historical Monitoring: The patient  reports no history of drug use. List of prior UDS Testing: No results found for: MDMA, COCAINSCRNUR, PCPSCRNUR, PCPQUANT, CANNABQUANT, THCU, ETH, CBDTHCR, D8THCCBX, D9THCCBX Historical Background Evaluation: [place] PMP: PDMP reviewed during this encounter. Review of the past 84-months conducted.             PMP NARX Score Report:  Narcotic: 120 Sedative: 050 Stimulant: 000 [place] Department of public safety, offender search: Engineer, mining Information) Non-contributory Risk Assessment Profile: Aberrant behavior: None observed or detected today Risk factors for fatal opioid overdose: None identified today PMP NARX Overdose Risk Score: 050 Fatal overdose hazard ratio (HR): Calculation deferred Non-fatal overdose hazard ratio (HR): Calculation deferred Risk of opioid abuse or dependence: 0.7-3.0% with doses <= 36 MME/day and 6.1-26% with doses >= 120 MME/day. Substance use disorder (SUD) risk level: See below Personal History of Substance Abuse (SUD-Substance use disorder):  Alcohol: Negative  Illegal Drugs: Negative  Rx Drugs: Negative  ORT Risk  Level calculation: Low Risk  Opioid Risk Tool - 02/21/24 0944       Personal History of Substance Abuse  Alcohol Negative    Illegal Drugs Negative    Rx Drugs Negative      Psychological Disease   Psychological Disease Negative    Depression Negative      Total Score   Opioid Risk Tool Scoring 0    Opioid Risk Interpretation Low Risk         ORT Scoring interpretation table:  Score <3 = Low Risk for SUD  Score between 4-7 = Moderate Risk for SUD  Score >8 = High Risk for Opioid Abuse   PHQ-2 Depression Scale:  Total score:    PHQ-2 Scoring interpretation table: (Score and probability of major depressive disorder)  Score 0 = No depression  Score 1 = 15.4% Probability  Score 2 = 21.1% Probability  Score 3 = 38.4% Probability  Score 4 = 45.5% Probability  Score 5 = 56.4% Probability  Score 6 = 78.6% Probability   PHQ-9 Depression Scale:  Total score:    PHQ-9 Scoring interpretation table:  Score 0-4 = No depression  Score 5-9 = Mild depression  Score 10-14 = Moderate depression  Score 15-19 = Moderately severe depression  Score 20-27 = Severe depression (2.4 times higher risk of SUD and 2.89 times higher risk of overuse)   Pharmacologic Plan: As per protocol, I have not taken over any controlled substance management, pending the results of ordered tests and/or consults.            Initial impression: Pending review of available data and ordered tests.  Meds   Current Outpatient Medications:    amLODipine  (NORVASC ) 5 MG tablet, Take 1 tablet (5 mg total) by mouth daily., Disp: 90 tablet, Rfl: 3   Cholecalciferol 125 MCG (5000 UT) capsule, Take 5,000 Units by mouth daily., Disp: , Rfl:    cyclobenzaprine  (FLEXERIL ) 10 MG tablet, Take 0.5-1 tablets (5-10 mg total) by mouth at bedtime as needed., Disp: 15 tablet, Rfl: 0   ezetimibe  (ZETIA ) 10 MG tablet, Take 1 tablet (10 mg total) by mouth daily., Disp: 90 tablet, Rfl: 3   metoprolol  succinate (TOPROL -XL) 25 MG 24 hr  tablet, TAKE 1 TABLET (25 MG TOTAL) BY MOUTH DAILY., Disp: 90 tablet, Rfl: 0   nitroGLYCERIN  (NITROSTAT ) 0.4 MG SL tablet, Place 1 tablet (0.4 mg total) under the tongue every 5 (five) minutes as needed for chest pain., Disp: 25 tablet, Rfl: 3   Omeprazole -Sodium Bicarbonate (ZEGERID) 20-1100 MG CAPS capsule, Take 1 capsule by mouth daily before breakfast., Disp: , Rfl:    phenazopyridine  (PYRIDIUM ) 200 MG tablet, Take 1 tab 3 times a day if needed., Disp: 30 tablet, Rfl: 1   pregabalin  (LYRICA ) 25 MG capsule, Take 1 capsule (25 mg total) by mouth at bedtime as needed., Disp: 30 capsule, Rfl: 0   rosuvastatin  (CRESTOR ) 40 MG tablet, Take 1 tablet (40 mg total) by mouth daily., Disp: 90 tablet, Rfl: 3   trimethoprim -polymyxin b  (POLYTRIM ) ophthalmic solution, Place 1 drop into both eyes every 4 (four) hours., Disp: 10 mL, Rfl: 0  Imaging Review  Lumbosacral Imaging: Lumbar MR wo contrast: Results for orders placed during the hospital encounter of 05/24/19 MR LUMBAR SPINE WO CONTRAST  Narrative CLINICAL DATA:  Low back pain worse on the left with pain radiating into the left leg. Left foot drop. Symptoms for several months. History of prostate cancer.  EXAM: MRI LUMBAR SPINE WITHOUT CONTRAST  TECHNIQUE: Multiplanar, multisequence MR imaging of the lumbar spine was performed. No intravenous contrast was administered.  COMPARISON:  None.  FINDINGS: Segmentation:  Standard.  Alignment: Minimal left convex curvature of the lumbar spine. No significant listhesis.  Vertebrae: No fracture or suspicious marrow lesion. Minimal Modic type 1 changes posteriorly at L3-4. Small hemangioma in the L3 vertebral body.  Conus medullaris and cauda equina: Conus extends to the upper L1 level. Conus and cauda equina appear normal.  Paraspinal and other soft tissues: 1.1 cm T2 hyperintense lesion in the medial upper pole of the left kidney compatible with a cyst as seen on 05/16/2019 abdominal  MRI.  Disc levels:  Disc desiccation throughout the lumbar spine. Mild disc space narrowing from L1-2 to L3-4.  T12-L1 minimal disc bulging and endplate spurring without stenosis.  L1-2: Mild disc bulging and endplate spurring without stenosis.  L2-3: Mild disc bulging, mild endplate spurring, and mild facet hypertrophy without significant stenosis.  L3-4: Disc bulging, a left foraminal to extraforaminal disc protrusion, and mild-to-moderate facet and ligamentum flavum hypertrophy result in mild left lateral recess stenosis and mild right and moderate left neural foraminal stenosis with potential left L3 nerve root impingement. No spinal stenosis.  L4-5: Mild disc bulging, endplate spurring, and mild-to-moderate facet and ligamentum flavum hypertrophy result in mild left neural foraminal stenosis and mild bilateral lateral recess stenosis without significant spinal stenosis.  L5-S1: Mild facet hypertrophy without disc herniation or stenosis.  IMPRESSION: 1. Multilevel lumbar disc and facet degeneration, most notable at L3-4 where there is moderate left neural foraminal stenosis and potential L3 nerve root impingement due to a disc protrusion. 2. Mild lateral recess and left neural foraminal stenosis at L4-5.   Electronically Signed By: Dasie Hamburg M.D. On: 05/24/2019 14:47  Knee Imaging: Knee-R DG 4 views: Results for orders placed in visit on 12/09/14 DG Knee Complete 4 Views Right  Narrative CLINICAL DATA:  Right medial knee pain and swelling for the past day without known trauma ; no similar prior symptoms.  EXAM: RIGHT KNEE - COMPLETE 4+ VIEW  COMPARISON:  None.  FINDINGS: The bones of the right knee are adequately mineralized. The joint spaces are preserved on these non standing views. There is a small spur from the inferior articular margin of the patella. There is beaking of the tibial spines. The patella is appropriately positioned in the patellar  notch of the femur. There is a small suprapatellar effusion.  IMPRESSION: There is a small suprapatellar effusion. There is mild degenerative spurring. There is no acute bony abnormality of the right knee.   Electronically Signed By: David  [place] M.D. On: 12/09/2014 16:05  Hand Imaging: Hand-L DG Complete: Results for orders placed during the hospital encounter of 08/21/22 DG Hand Complete Left  Narrative CLINICAL DATA:  Trauma 4 days ago. Pain, particularly around the thumb.  EXAM: LEFT HAND - COMPLETE 3+ VIEW  COMPARISON:  None Available.  FINDINGS: Soft tissue calcification is identified between the base of the first metacarpal and trapezium. The primary calcification is well corticated and not thought to be acute. No donor site is identified to suggest a fracture. No acute fractures are noted. No dislocation. No other acute abnormalities.  IMPRESSION: The soft tissue calcification between the base of the first metacarpal and trapezium is favored to represent sequela of a chronic process. No definitive acute fracture identified.   Electronically Signed By: Alm Pouch III M.D. On: 08/21/2022 08:12  Complexity Note: Imaging results reviewed.  ROS  Cardiovascular: Heart trouble, Daily Aspirin  intake, High blood pressure, and Blood thinners:  Anticoagulant Pulmonary or Respiratory: Snoring  and Temporary stoppage of breathing during sleep Neurological: No reported neurological signs or symptoms such as seizures, abnormal skin sensations, urinary and/or fecal incontinence, being born with an abnormal open spine and/or a tethered spinal cord Psychological-Psychiatric: No reported psychological or psychiatric signs or symptoms such as difficulty sleeping, anxiety, depression, delusions or hallucinations (schizophrenial), mood swings (bipolar disorders) or suicidal ideations or attempts Gastrointestinal: Reflux or heatburn Genitourinary:  Peeing blood Hematological: No reported hematological signs or symptoms such as prolonged bleeding, low or poor functioning platelets, bruising or bleeding easily, hereditary bleeding problems, low energy levels due to low hemoglobin or being anemic Endocrine: No reported endocrine signs or symptoms such as high or low blood sugar, rapid heart rate due to high thyroid  levels, obesity or weight gain due to slow thyroid  or thyroid  disease Rheumatologic: No reported rheumatological signs and symptoms such as fatigue, joint pain, tenderness, swelling, redness, heat, stiffness, decreased range of motion, with or without associated rash Musculoskeletal: Negative for myasthenia gravis, muscular dystrophy, multiple sclerosis or malignant hyperthermia Work History: Retired  Allergies  Mr. Maalouf is allergic to ibuprofen and gabapentin.  Laboratory Chemistry Profile   Renal Lab Results  Component Value Date   BUN 20 01/19/2024   CREATININE 1.54 (H) 01/19/2024   BCR 13 01/19/2024   GFR 56.55 (L) 07/28/2023   GFRAA 68 03/02/2020   GFRNONAA 53 (L) 08/21/2022   SPECGRAV <=1.005 (A) 05/15/2023   PHUR 6.5 05/15/2023   PROTEINUR Negative 05/15/2023     Electrolytes Lab Results  Component Value Date   NA 139 01/19/2024   K 5.2 01/19/2024   CL 101 01/19/2024   CALCIUM  9.9 01/19/2024   MG 2.2 01/19/2024     Hepatic Lab Results  Component Value Date   AST 20 07/28/2023   ALT 9 07/28/2023   ALBUMIN 3.9 07/28/2023   ALKPHOS 79 07/28/2023     ID Lab Results  Component Value Date   SARSCOV2NAA RESULT:  NEGATIVE 06/18/2019   HCVAB NEGATIVE 08/27/2015     Bone Lab Results  Component Value Date   VD25OH 45.07 12/09/2021     Endocrine Lab Results  Component Value Date   GLUCOSE 93 01/19/2024   GLUCOSEU NEGATIVE 05/01/2023   HGBA1C 6.3 10/13/2022   TSH 2.95 01/19/2024     Neuropathy Lab Results  Component Value Date   VITAMINB12 361 10/13/2022   HGBA1C 6.3 10/13/2022      CNS No results found for: COLORCSF, APPEARCSF, RBCCOUNTCSF, WBCCSF, POLYSCSF, LYMPHSCSF, EOSCSF, PROTEINCSF, GLUCCSF, JCVIRUS, CSFOLI, IGGCSF, LABACHR, ACETBL   Inflammation (CRP: Acute  ESR: Chronic) Lab Results  Component Value Date   CRP 0.7 08/21/2022   ESRSEDRATE 21 (H) 08/21/2022     Rheumatology Lab Results  Component Value Date   ANA POSITIVE (A) 06/03/2021   LABURIC 5.8 08/21/2022     Coagulation Lab Results  Component Value Date   INR 1.0 05/26/2021   LABPROT 11.1 05/26/2021   PLT 190 01/19/2024     Cardiovascular Lab Results  Component Value Date   CKTOTAL 45 01/19/2024   HGB 14.9 01/19/2024   HCT 46.0 01/19/2024     Screening Lab Results  Component Value Date   SARSCOV2NAA RESULT:  NEGATIVE 06/18/2019   HCVAB NEGATIVE 08/27/2015     Cancer No results found for: CEA, CA125, LABCA2   Allergens No results found for: ALMOND, APPLE, ASPARAGUS, AVOCADO,  BANANA, BARLEY, BASIL, BAYLEAF, GREENBEAN, LIMABEAN, WHITEBEAN, BEEFIGE, REDBEET, BLUEBERRY, BROCCOLI, CABBAGE, MELON, CARROT, CASEIN, CASHEWNUT, CAULIFLOWER, CELERY     Note: Lab results reviewed.  PFSH  Drug: Howard [name]  reports no history of drug use. Alcohol:  reports no history of alcohol use. Tobacco:  reports that he has never smoked. He has never used smokeless tobacco. Medical:  has a past medical history of Anesthesia complication, Aortic atherosclerosis (HCC), Colon polyps, DVT (deep venous thrombosis) (HCC), ED (erectile dysfunction), Essential hypertension (06/25/2008), GERD (gastroesophageal reflux disease), HYPERLIPIDEMIA (07/24/2008), Hypersomnia (01/05/2017), Near syncope (03/14/2017), Neck pain (12/15/2011), OSA (obstructive sleep apnea) (11/13/2016), Prostate cancer (HCC), Sleep apnea (2018), and Thoracic ascending aortic aneurysm (HCC) (04/17/2017). Family: family history includes Stroke in his mother.  Past  Surgical History:  Procedure Laterality Date   COLONOSCOPY     ESOPHAGOGASTRODUODENOSCOPY  10/02/2000   dilated reflux esopsh h[facility]   [facility]: Dehydration; vasvagal sync  12/09/ 05/2003   PLANTAR FASCIA SURGERY Bilateral    POLYPECTOMY     ROBOT ASSISTED LAPAROSCOPIC RADICAL PROSTATECTOMY N/A 05/27/2013   Procedure: ROBOTIC ASSISTED LAPAROSCOPIC RADICAL PROSTATECTOMY LEVEL 1;  Surgeon: Noretta Ferrara, MD;  Location: [facility];  Service: Urology;  Laterality: N/A;   UPPER GASTROINTESTINAL ENDOSCOPY     Active Ambulatory Problems    Diagnosis Date Noted   Essential hypertension 06/25/2008   URI (upper respiratory infection) 08/13/2009   ATOPIC RHINITIS 01/26/2007   IMPOTENCE, ORGANIC ORIGIN 01/26/2007   Prostate cancer (HCC) 01/13/2011   Back pain 02/15/2011   Medicare annual wellness visit, subsequent 12/20/2012   Hyperlipidemia LDL goal <70 08/04/2014   Knee pain 12/10/2014   Advance care planning 08/27/2015   Health care maintenance 12/01/2016   GERD (gastroesophageal reflux disease) 12/01/2016   Thoracic ascending aortic aneurysm (HCC) 04/17/2017   OSA (obstructive sleep apnea) 06/02/2017   Intermittent claudication (HCC) 07/20/2017   Metatarsalgia 09/17/2017   Vitamin D  deficiency 12/10/2017   Paroxysmal atrial fibrillation (HCC) 02/20/2018   Premature atrial beats 05/04/2018   CAD (coronary artery disease) 05/04/2018   Meningioma (HCC) 04/17/2019   Aortic atherosclerosis (HCC)    Gallstones 03/13/2020   Creatinine elevation 09/03/2020   Constipation 04/05/2021   Elevated LFTs 05/26/2021   Fatty liver 05/26/2021   Urinary frequency 06/13/2022   Dysuria 06/13/2022   Pain in left hand 08/21/2022   Paresthesia 10/16/2022   Precordial pain 11/18/2022   Lumbar radiculopathy, right 03/02/2023   Irritation of eyelid 04/05/2023   DVT (deep venous thrombosis) (HCC) 05/03/2023   Chronic Penile pain 05/15/2023   Other microscopic hematuria 05/15/2023   Black stool 07/25/2023   Pudendal  neuralgia 01/14/2024   Conjunctival irritation 01/14/2024   Muscle cramp 01/19/2024   Chronic pain syndrome 02/20/2024   Pharmacologic therapy 02/20/2024   Disorder of skeletal system 02/20/2024   Problems influencing health status 02/20/2024   History of prostatectomy 02/20/2024   History of prostate cancer 02/20/2024   Abnormal MRI, lumbar spine (05/24/2019) 02/20/2024   Neurogenic pain 02/21/2024   Food intolerance in adult (triggering penile pain) 02/21/2024   History of penile lesion 02/21/2024   Resolved Ambulatory Problems    Diagnosis Date Noted   HYPERLIPIDEMIA 07/24/2008   CERUMEN IMPACTION, LEFT 02/12/2009   External hemorrhoids 08/27/2009   PROSTATITIS, 01/26/2007   SKIN RASH 07/05/2010   Aphthous ulcer 01/13/2011   Neck pain 12/15/2011   ETD (eustachian tube dysfunction) 01/18/2013   Conjunctivitis 01/28/2014   Cerumen impaction 02/23/2014   Rhinitis 08/04/2014   Low back pain 10/14/2014  Sebaceous cyst 02/13/2015   Rash and nonspecific skin eruption 02/25/2016   Tinea 03/04/2016   Cerumen impaction 03/04/2016   BPV (benign positional vertigo) 04/21/2016   Snoring 11/13/2016   Colon cancer screening 12/01/2016   Hypersomnia 01/05/2017   Left shoulder pain 03/14/2017   Near syncope 03/14/2017   Hypercalcemia 06/04/2017   Cellulitis of finger 07/20/2017   Left groin pain 01/14/2018   Chest pain 02/09/2018   Thoracic aortic aneurysm without rupture (HCC) 02/09/2018   Atypical chest pain 02/20/2018   Wax in ear 08/26/2018   Left-sided weakness 04/17/2019   Alkaline phosphatase elevation 07/07/2019   Arthropod bite 03/08/2020   Diarrhea 07/25/2023   Past Medical History:  Diagnosis Date   Anesthesia complication    Colon polyps    ED (erectile dysfunction)    Sleep apnea 2018   Constitutional Exam  General appearance: Well nourished, well developed, and well hydrated. In no apparent acute distress Vitals:   02/21/24 0926  BP: 136/84  Pulse: (!) 54   Resp: 16  Temp: (!) 97.4 F (36.3 C)  SpO2: 98%  Weight: 195 lb (88.5 kg)  Height: 5' 9 (1.753 m)   BMI Assessment: Estimated body mass index is 28.8 kg/m as calculated from the following:   Height as of this encounter: 5' 9 (1.753 m).   Weight as of this encounter: 195 lb (88.5 kg).  BMI interpretation table: BMI level Category Range association with higher incidence of chronic pain  <18 kg/m2 Underweight   18.5-24.9 kg/m2 Ideal body weight   25-29.9 kg/m2 Overweight Increased incidence by 20%  30-34.9 kg/m2 Obese (Class I) Increased incidence by 68%  35-39.9 kg/m2 Severe obesity (Class II) Increased incidence by 136%  >40 kg/m2 Extreme obesity (Class III) Increased incidence by 254%   Patient's current BMI Ideal Body weight  Body mass index is 28.8 kg/m. Ideal body weight: 70.7 kg (155 lb 13.8 oz) Adjusted ideal body weight: 77.8 kg (171 lb 8.3 oz)   BMI Readings from Last 4 Encounters:  02/21/24 28.80 kg/m  01/19/24 28.70 kg/m  01/12/24 29.15 kg/m  12/22/23 28.71 kg/m   Wt Readings from Last 4 Encounters:  02/21/24 195 lb (88.5 kg)  01/19/24 194 lb 6 oz (88.2 kg)  01/12/24 197 lb 6.4 oz (89.5 kg)  12/22/23 194 lb 6.4 oz (88.2 kg)    Psych/Mental status: Alert, oriented x 3 (person, place, & time)       Eyes: PERLA Respiratory: No evidence of acute respiratory distress  Assessment  Primary Diagnosis & Pertinent Problem List: The primary encounter diagnosis was Chronic pain syndrome. Diagnoses of Chronic Penile pain, Neurogenic pain, History of prostate cancer, History of penile lesion, History of prostatectomy, Food intolerance in adult (triggering penile pain), Pharmacologic therapy, Disorder of skeletal system, Problems influencing health status, and Vitamin D  deficiency were also pertinent to this visit.  Visit Diagnosis (New problems to examiner): 1. Chronic pain syndrome   2. Chronic Penile pain   3. Neurogenic pain   4. History of prostate cancer    5. History of penile lesion   6. History of prostatectomy   7. Food intolerance in adult (triggering penile pain)   8. Pharmacologic therapy   9. Disorder of skeletal system   10. Problems influencing health status   11. Vitamin D  deficiency    Plan of Care (Initial workup plan)  Note: Howard [name] was reminded that as per protocol, today's visit has been an evaluation only. We have not taken over  the patient's controlled substance management.  Problem-specific plan: Assessment and Plan    Chronic penile pain   Chronic penile pain for 18 months, primarily at the glans, worsens with beverages like Pepsi, coffee, and lemonade. Pain is intermittent with burning during urination. A previous sore resolved with topical treatment. CT scan is normal. Neuropathic pain is considered. Previous treatments with topical creams and prednisone  provided temporary relief, but gabapentin caused adverse effects. Order blood tests to evaluate underlying causes. Start a trial of low-dose pregabalin  at bedtime for neuropathic pain management. Educate on potential side effects of pregabalin , including drowsiness, and advise monitoring for allergic reactions. Continue current medications as needed for flare-ups. Provide an after-visit summary with information on pregabalin  and send the prescription to [facility].  Aortic aneurysm   Aortic aneurysm requires avoiding heavy lifting as advised by the cardiologist to prevent complications.       Lab Orders         Compliance Drug Analysis, Ur         Vitamin B12         Sedimentation rate         25-Hydroxy vitamin D  Lcms D2+D3         C-reactive protein         Food Allergy  Profile         Allergens(14)         STI Profile     Imaging Orders  No imaging studies ordered today   Referral Orders  No referral(s) requested today   Procedure Orders    No procedure(s) ordered today   Pharmacotherapy (current): Medications ordered:  Meds ordered this  encounter  Medications   pregabalin  (LYRICA ) 25 MG capsule    Sig: Take 1 capsule (25 mg total) by mouth at bedtime as needed.    Dispense:  30 capsule    Refill:  0    Fill one day early if pharmacy is closed on scheduled refill date. May substitute for generic if available.   Medications administered during this visit: Howard [name] had no medications administered during this visit.   Analgesic Pharmacotherapy:  Opioid Analgesics: For patients currently taking or requesting to take opioid analgesics, in accordance with [place]  [employer] Board Guidelines, we will assess their risks and indications for the use of these substances. After completing our evaluation, we may offer recommendations, but we no longer take patients for medication management. The prescribing physician will ultimately decide, based on his/her training and level of comfort whether to adopt any of the recommendations, including whether or not to prescribe such medicines.  Membrane stabilizer: The patient's record indicates that he is allergic to gabapentin.  According to the record it causes confusion and altered mental status.  I attempted to find out the dose and regimen regiment that he had been prescribed when the side effects occurred, but there was no information about that in the chart.  Confusion and altered mental status is a common side effect of this medication when started at high doses.  Since there is documentation that he had a trial of that with side effects, today we will use pregabalin  (Lyrica ) 25 mg p.o. at bedtime as a starting dose to see if this can help with his pain.  The patient has been given information regarding the medication and how to take it including the fact that he has to be taken on a daily basis and not PRN.  He was also informed that he will take  approximately 2 to 3 weeks for adequate blood levels to develop.  He understood and accepted.  He was asked whether or not he thought that his  PCP would be amenable to taking over the medication if it helped and he indicated that he did not see any reason why he would not.  For this reason, we have initiated that trial for him instead of simply making it a recommendation.  I will have him come back in 2 weeks for follow-up evaluation and review of the diagnostic lab work.  Muscle relaxant: To be determined at a later time  NSAID: To be determined at a later time  Other analgesic(s): To be determined at a later time   Interventional management options: Howard [name] was informed that there is no guarantee that he would be a candidate for interventional therapies. The decision will be based on the results of diagnostic studies, as well as Howard [name]'s risk profile.  Procedure(s) under consideration:  Pending results of ordered studies     Interventional Therapies  Risk Factors  Considerations  Medical Comorbidities:     Planned  Pending:   (02/21/2024) lab work ordered.  Trial with Lyrica  25 mg p.o. at bedtime started.  If patient does well, will transfer to PCP.   Under consideration:   Pending   Completed: (Analgesic benefit)1  None at this time   Therapeutic  Palliative (PRN) options:   None established   Completed by other providers:   None reported  1(Analgesic benefit): Expressed in percentage (%). (Local anesthetic[LA] +/- sedation  L.A.Local Anesthetic  Steroid benefit  Ongoing benefit)   Provider-requested follow-up: Return in about 2 weeks (around 03/06/2024) for ( ), Eval-day (M,W), (F2F), 2nd Visit, for review of ordered tests (Lyrica  trial).  Future Appointments  Date Time Provider Department Center  03/18/2024 11:15 AM [facility] - [facility]  03/22/2024  8:50 AM Lelon Hamilton T, PA-C [facility]   I discussed the assessment and treatment plan with the patient. The patient was provided an opportunity to ask questions and all were answered. The patient agreed with the plan and demonstrated an  understanding of the instructions.  Patient advised to call back or seek an in-person evaluation if the symptoms or condition worsens.  Duration of encounter: 36 minutes.  Total time on encounter, as per AMA guidelines included both the face-to-face and non-face-to-face time personally spent by the physician and/or other qualified health care professional(s) on the day of the encounter (includes time in activities that require the physician or other qualified health care professional and does not include time in activities normally performed by clinical staff). Physician's time may include the following activities when performed: Preparing to see the patient (e.g., pre-charting review of records, searching for previously ordered imaging, lab work, and nerve conduction tests) Review of prior analgesic pharmacotherapies. Reviewing PMP Interpreting ordered tests (e.g., lab work, imaging, nerve conduction tests) Performing post-procedure evaluations, including interpretation of diagnostic procedures Obtaining and/or reviewing separately obtained history Performing a medically appropriate examination and/or evaluation Counseling and educating the patient/family/caregiver Ordering medications, tests, or procedures Referring and communicating with other health care professionals (when not separately reported) Documenting clinical information in the electronic or other health record Independently interpreting results (not separately reported) and communicating results to the patient/ family/caregiver Care coordination (not separately reported)  Note by: Eric DELENA Como, MD (TTS and AI technology used. I apologize for any typographical errors that were not detected and corrected.) Date: 02/21/2024; Time: 11:01  AM

## 2024-02-20 NOTE — Patient Instructions (Signed)

## 2024-02-21 ENCOUNTER — Ambulatory Visit: Attending: Pain Medicine | Admitting: Pain Medicine

## 2024-02-21 VITALS — BP 136/84 | HR 54 | Temp 97.4°F | Resp 16 | Ht 69.0 in | Wt 195.0 lb

## 2024-02-21 DIAGNOSIS — M899 Disorder of bone, unspecified: Secondary | ICD-10-CM | POA: Insufficient documentation

## 2024-02-21 DIAGNOSIS — E559 Vitamin D deficiency, unspecified: Secondary | ICD-10-CM | POA: Diagnosis present

## 2024-02-21 DIAGNOSIS — N489 Disorder of penis, unspecified: Secondary | ICD-10-CM | POA: Diagnosis present

## 2024-02-21 DIAGNOSIS — K9049 Malabsorption due to intolerance, not elsewhere classified: Secondary | ICD-10-CM | POA: Diagnosis present

## 2024-02-21 DIAGNOSIS — G8929 Other chronic pain: Secondary | ICD-10-CM | POA: Diagnosis present

## 2024-02-21 DIAGNOSIS — Z79899 Other long term (current) drug therapy: Secondary | ICD-10-CM | POA: Diagnosis present

## 2024-02-21 DIAGNOSIS — N4889 Other specified disorders of penis: Secondary | ICD-10-CM | POA: Diagnosis present

## 2024-02-21 DIAGNOSIS — G894 Chronic pain syndrome: Secondary | ICD-10-CM | POA: Diagnosis present

## 2024-02-21 DIAGNOSIS — Z8546 Personal history of malignant neoplasm of prostate: Secondary | ICD-10-CM | POA: Diagnosis present

## 2024-02-21 DIAGNOSIS — Z9079 Acquired absence of other genital organ(s): Secondary | ICD-10-CM | POA: Diagnosis present

## 2024-02-21 DIAGNOSIS — M792 Neuralgia and neuritis, unspecified: Secondary | ICD-10-CM | POA: Diagnosis present

## 2024-02-21 DIAGNOSIS — Z789 Other specified health status: Secondary | ICD-10-CM | POA: Insufficient documentation

## 2024-02-21 DIAGNOSIS — R937 Abnormal findings on diagnostic imaging of other parts of musculoskeletal system: Secondary | ICD-10-CM

## 2024-02-21 MED ORDER — PREGABALIN 25 MG PO CAPS: 25.0000 mg | ORAL_CAPSULE | Freq: Every evening | ORAL | 0 refills | Status: DC | PRN

## 2024-02-21 NOTE — Addendum Note (Signed)
 Addended by: TANYA GLISSON A on: 02/21/2024 11:02 AM   Modules accepted: Orders

## 2024-02-21 NOTE — Progress Notes (Signed)
 Safety precautions to be maintained throughout the outpatient stay will include: orient to surroundings, keep bed in low position, maintain call bell within reach at all times, provide assistance with transfer out of bed and ambulation.

## 2024-02-22 LAB — STI PROFILE
HCV Ab: NONREACTIVE
HIV Screen 4th Generation wRfx: NONREACTIVE
Hep B Core Total Ab: NEGATIVE
Hep B Surface Ab, Qual: REACTIVE
Hepatitis B Surface Ag: NEGATIVE
RPR Ser Ql: NONREACTIVE

## 2024-02-22 LAB — HCV INTERPRETATION

## 2024-02-23 LAB — ALLERGENS(14)
Allergen Corn, IgE: <0.1 kU/L
Allergen Salmon IgE: <0.1 kU/L
Beef IgE: <0.1 kU/L
Chocolate/Cacao IgE: <0.1 kU/L
Codfish IgE: <0.1 kU/L
Egg, Whole IgE: <0.1 kU/L
F037-IgE Mussel: <0.1 kU/L
Milk IgE: <0.1 kU/L
Peanut IgE: <0.1 kU/L
Pork IgE: <0.1 kU/L
Shrimp IgE: <0.1 kU/L
Soybean IgE: <0.1 kU/L
Tuna: <0.1 kU/L
Wheat IgE: <0.1 kU/L

## 2024-02-24 LAB — COMPLIANCE DRUG ANALYSIS, UR

## 2024-02-29 LAB — FOOD ALLERGY PROFILE
Allergen Corn, IgE: <0.1 kU/L
Clam IgE: <0.1 kU/L
Codfish IgE: <0.1 kU/L
Egg White IgE: <0.1 kU/L
Milk IgE: <0.1 kU/L
Peanut IgE: <0.1 kU/L
Scallop IgE: <0.1 kU/L
Sesame Seed IgE: <0.1 kU/L
Shrimp IgE: <0.1 kU/L
Soybean IgE: <0.1 kU/L
Walnut IgE: <0.1 kU/L
Wheat IgE: <0.1 kU/L

## 2024-02-29 LAB — SEDIMENTATION RATE: Sed Rate: 12 mm/h (ref 0–30)

## 2024-02-29 LAB — 25-HYDROXY VITAMIN D LCMS D2+D3
25-Hydroxy, Vitamin D-2: <1 ng/mL
25-Hydroxy, Vitamin D-3: 33 ng/mL
25-Hydroxy, Vitamin D: 33 ng/mL

## 2024-02-29 LAB — C-REACTIVE PROTEIN: CRP: <1 mg/L (ref 0–10)

## 2024-02-29 LAB — VITAMIN B12: Vitamin B-12: 377 pg/mL (ref 232–1245)

## 2024-03-10 ENCOUNTER — Other Ambulatory Visit: Payer: Self-pay | Admitting: Family Medicine

## 2024-03-10 NOTE — Progress Notes (Unsigned)
 PROVIDER NOTE: Interpretation of information contained herein should be left to medically-trained personnel. Specific patient instructions are provided elsewhere under Patient Instructions section of medical record. This document was created in part using AI and STT-dictation technology, any transcriptional errors that may result from this process are unintentional.  Patient: Howard [name]  Service: E/M   PCP: Howard Arlyss RAMAN, MD  DOB: [birth year]  DOS: 03/11/2024  Provider: Eric DELENA Como, MD  MRN: [id number]  Delivery: Face-to-face  Specialty: Interventional Pain Management  Type: Established Patient  Setting: Ambulatory outpatient facility  Specialty designation: 09  Referring Prov.: Howard Arlyss RAMAN, MD  Location: Outpatient office facility       Primary Reason(s) for Visit: Encounter for evaluation before starting new chronic pain management plan of care (Level of risk: moderate) CC: Penis Pain (Penile pain )  HPI  Howard [name] is a 73 y.o. year old, male patient, who comes today for a follow-up evaluation to review the test results and decide on a treatment plan. He has Essential hypertension; URI (upper respiratory infection); ATOPIC RHINITIS; IMPOTENCE, ORGANIC ORIGIN; Prostate cancer (HCC); Back pain; Medicare annual wellness visit, subsequent; Hyperlipidemia LDL goal <70; Knee pain; Advance care planning; Health care maintenance; GERD (gastroesophageal reflux disease); Thoracic ascending aortic aneurysm (HCC); OSA (obstructive sleep apnea); Intermittent claudication (HCC); Metatarsalgia; Vitamin D  deficiency; Paroxysmal atrial fibrillation (HCC); Premature atrial beats; CAD (coronary artery disease); Meningioma (HCC); Aortic atherosclerosis (HCC); Gallstones; Creatinine elevation; Constipation; Elevated LFTs; Fatty liver; Urinary frequency; Dysuria; Pain in left hand; Paresthesia; Precordial pain; Lumbar radiculopathy, right; Irritation of eyelid; DVT (deep venous thrombosis) (HCC);  Chronic Penile pain; Other microscopic hematuria; Black stool; Pudendal neuralgia; Conjunctival irritation; Muscle cramp; Chronic pain syndrome; Pharmacologic therapy; Disorder of skeletal system; Problems influencing health status; History of prostatectomy; History of prostate cancer; Abnormal MRI, lumbar spine (05/24/2019); Neurogenic pain; Food intolerance in adult (triggering penile pain); and History of penile lesion on their problem list. His primarily concern today is the Penis Pain (Penile pain )  Pain Assessment: Location:   Penis Radiating:   Onset: More than a month ago Duration: Chronic pain Quality: Aching, Discomfort, Nagging Severity: 5 /10 (subjective, self-reported pain score)  Effect on ADL: Decreased ADLs and hard to use certain clothing Timing: Constant Modifying factors: phenazopyridine  200mg  when there is a falre up BP: 106/75  HR: (!) 56  Howard [name] comes in today for a follow-up visit after his initial evaluation on 02/21/2024. Today we went over the results of his tests. These were explained in Layman's terms. During today's appointment we went over my diagnostic impression, as well as the proposed treatment plan.  Review of initial evaluation (02/21/2024): Howard [name] is a 73 year old male who presents with chronic penile pain. He was referred by Dr. Abigail for evaluation of chronic penile pain.   He has been experiencing chronic pain localized to the head of the penis for approximately a year and a half. The pain typically begins a few minutes after consuming certain beverages, such as Pepsi, coffee, caffeinated coffee, chocolate, and lemonade, and is described as an ache without associated redness, swelling, or tenderness. He also reports a burning sensation during urination that developed over time.   In October, a sore appeared on the side of the head of the penis, which was painful. He was prescribed a cream that did not initially help, but after increased  hygiene and continued use, the sore resolved within a couple of weeks and has not recurred.  He has a history of prostate cancer, for which he underwent prostate removal approximately ten years ago. He has been evaluated by a specialist in [place], who could not find any abnormalities. A CT scan was performed, which did not reveal any significant findings.   He has been taking a medication that turns his urine red, yellow, and orange, which was prescribed to relieve urinary discomfort. He also took prednisone  for a couple of weeks, and reported that both medicines together helped. The pain does not disturb his sleep but begins upon urination in the morning and persists throughout the day, although it can fluctuate in intensity.   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  Review of diagnostic test ordered on 02/21/2024:  Diagnostic lab work: Vitamin D  levels, C-reactive protein, sed rate, and vitamin B12 levels were all within normal limits.  Allergy  test were negative and nonreactive to milk, codfish, wheat, corn, peanuts, soybean, shrimp, pork, beef, Mussel, tuna, salmon, chocolate/cacao, and eggs.  UDS was negative for any illicit substances.  Laboratory testing for STI (sexually transmitted illnesses) were all negative. Diagnostic imaging: None ordered.  POC: Patient started on a pregabalin  (Lyrica ) 25 mg p.o. at bedtime trial.  Discussed the use of AI scribe software for clinical note transcription with the patient, who gave verbal consent to proceed.  History of Present Illness   Howard [name] is a 73 year old male who presents with chronic penile pain associated with the consumption of certain beverages.  He experiences chronic penile pain, which he associates with the consumption of beverages such as Pepsi, coffee, caffeinated coffee, and chocolate. This issue was first  noted during his initial visit on February 21, 2024.  The tests were negative and nonreactive for a variety of potential allergens, including milk, codfish, wheat, corn, peanuts, soybeans, shrimp, pork, beef, mussels, shellfish, tuna, salmon, chocolate, cacao, and eggs. Despite the negative allergy  tests, the pain persists.      Patient presented with interventional treatment options. Mr. Clipper was informed that I will not be providing medication management. Pharmacotherapy evaluation including recommendations may be offered, if specifically requested.   Controlled Substance Pharmacotherapy Assessment REMS (Risk Evaluation and Mitigation Strategy)  Opioid Analgesic: None MME/day: 0 mg/day   Pill Count: None expected due to no prior prescriptions written by our practice. Bonner Norris, RN  03/11/2024  9:26 AM  Sign when Signing Visit Safety precautions to be maintained throughout the outpatient stay will include: orient to surroundings, keep bed in low position, maintain call bell within reach at all times, provide assistance with transfer out of bed and ambulation.     Pharmacokinetics: Liberation and absorption (onset of action): WNL Distribution (time to peak effect): WNL Metabolism and excretion (duration of action): WNL         Pharmacodynamics: Desired effects: Analgesia: Mr. Besson reports >50% benefit. Functional ability: Patient reports that medication allows him to accomplish basic ADLs Clinically meaningful improvement in function (CMIF): Sustained CMIF goals met Perceived effectiveness: Described as relatively effective, allowing for increase in activities of daily living (ADL) Undesirable effects: Side-effects or Adverse reactions: None reported Monitoring: [place] PMP: PDMP reviewed during this encounter. Online review of the past 51-month period previously conducted. Not applicable at this point since we have not taken over the patient's medication management yet. List of other  Serum/Urine Drug Screening Test(s):  No results  found for: AMPHSCRSER, BARBSCRSER, BENZOSCRSER, COCAINSCRSER, COCAINSCRNUR, PCPSCRSER, THCSCRSER, THCU, CANNABQUANT, OPIATESCRSER, OXYSCRSER, PROPOXSCRSER, ETH, CBDTHCR, D8THCCBX, D9THCCBX List of all UDS test(s) done:  Lab Results  Component Value Date   SUMMARY FINAL 02/21/2024   Last UDS on record: Summary  Date Value Ref Range Status  02/21/2024 FINAL  Final    Comment:    ==================================================================== Compliance Drug Analysis, Ur ==================================================================== Test                             Result       Flag       Units  Drug Present and Declared for Prescription Verification   Metoprolol                      PRESENT      EXPECTED  Drug Present not Declared for Prescription Verification   Acetaminophen                   PRESENT      UNEXPECTED  Drug Absent but Declared for Prescription Verification   Pregabalin                      Not Detected UNEXPECTED   Cyclobenzaprine                 Not Detected UNEXPECTED ==================================================================== Test                      Result    Flag   Units      Ref Range   Creatinine              207              mg/dL      >=79 ==================================================================== Declared Medications:  The flagging and interpretation on this report are based on the  following declared medications.  Unexpected results may arise from  inaccuracies in the declared medications.   **Note: The testing scope of this panel includes these medications:   Cyclobenzaprine  (Flexeril )  Metoprolol  (Toprol )  Pregabalin  (Lyrica )   **Note: The testing scope of this panel does not include the  following reported medications:   Amlodipine  (Norvasc )  Cholecalciferol  Ezetimibe  (Zetia )  Nitroglycerin  (Nitrostat )  Omeprazole  (Zegerid)   Phenazopyridine  (Pyridium )  Polymyxin B  (Polytrim )  Rosuvastatin  (Crestor )  Trimethoprim  (Polytrim ) ==================================================================== For clinical consultation, please call [phone]. ====================================================================    UDS interpretation: No unexpected findings.          Medication Assessment Form: Not applicable. No opioids. Treatment compliance: Not applicable Risk Assessment Profile: Aberrant behavior: See initial evaluations. None observed or detected today Comorbid factors increasing risk of overdose: See initial evaluation. No additional risks detected today Opioid risk tool (ORT):     02/21/2024    9:44 AM  Opioid Risk   Alcohol 0  Illegal Drugs 0  Rx Drugs 0  Psychological Disease 0  Depression 0  Opioid Risk Tool Scoring 0  Opioid Risk Interpretation Low Risk    ORT Scoring interpretation table:  Score <3 = Low Risk for SUD  Score between 4-7 = Moderate Risk for SUD  Score >8 = High Risk for Opioid Abuse   Risk of substance use disorder (SUD): Low  Risk Mitigation Strategies:  Patient opioid safety counseling: No controlled substances prescribed. Patient-Prescriber Agreement (PPA): No agreement signed.  Controlled substance notification to other providers: None required. No opioid therapy.  Pharmacologic  Plan: Non-opioid analgesic therapy offered. Interventional alternatives discussed.             Laboratory Chemistry Profile   Renal Lab Results  Component Value Date   BUN 20 01/19/2024   CREATININE 1.54 (H) 01/19/2024   BCR 13 01/19/2024   GFR 56.55 (L) 07/28/2023   GFRAA 68 03/02/2020   GFRNONAA 53 (L) 08/21/2022   SPECGRAV <=1.005 (A) 05/15/2023   PHUR 6.5 05/15/2023   PROTEINUR Negative 05/15/2023     Electrolytes Lab Results  Component Value Date   NA 139 01/19/2024   K 5.2 01/19/2024   CL 101 01/19/2024   CALCIUM  9.9 01/19/2024   MG 2.2 01/19/2024      Hepatic Lab Results  Component Value Date   AST 20 07/28/2023   ALT 9 07/28/2023   ALBUMIN 3.9 07/28/2023   ALKPHOS 79 07/28/2023     ID Lab Results  Component Value Date   HIV Non Reactive 02/21/2024   SARSCOV2NAA RESULT:  NEGATIVE 06/18/2019   HCVAB NEGATIVE 08/27/2015     Bone Lab Results  Component Value Date   VD25OH 45.07 12/09/2021   25OHVITD1 33 02/21/2024   25OHVITD2 <1.0 02/21/2024   25OHVITD3 33 02/21/2024     Endocrine Lab Results  Component Value Date   GLUCOSE 93 01/19/2024   GLUCOSEU NEGATIVE 05/01/2023   HGBA1C 6.3 10/13/2022   TSH 2.95 01/19/2024     Neuropathy Lab Results  Component Value Date   VITAMINB12 377 02/21/2024   HGBA1C 6.3 10/13/2022   HIV Non Reactive 02/21/2024     CNS No results found for: COLORCSF, APPEARCSF, RBCCOUNTCSF, WBCCSF, POLYSCSF, LYMPHSCSF, EOSCSF, PROTEINCSF, GLUCCSF, JCVIRUS, CSFOLI, IGGCSF, LABACHR, ACETBL   Inflammation (CRP: Acute  ESR: Chronic) Lab Results  Component Value Date   CRP <1 02/21/2024   ESRSEDRATE 12 02/21/2024     Rheumatology Lab Results  Component Value Date   ANA POSITIVE (A) 06/03/2021   LABURIC 5.8 08/21/2022     Coagulation Lab Results  Component Value Date   INR 1.0 05/26/2021   LABPROT 11.1 05/26/2021   PLT 190 01/19/2024     Cardiovascular Lab Results  Component Value Date   CKTOTAL 45 01/19/2024   HGB 14.9 01/19/2024   HCT 46.0 01/19/2024     Screening Lab Results  Component Value Date   SARSCOV2NAA RESULT:  NEGATIVE 06/18/2019   HCVAB NEGATIVE 08/27/2015   HIV Non Reactive 02/21/2024     Cancer No results found for: CEA, CA125, LABCA2   Allergens Lab Results  Component Value Date   BEEFIGE <0.10 02/21/2024       Note: Lab results reviewed.  Recent Diagnostic Imaging Review  Lumbosacral Imaging: Lumbar MR wo contrast: Results for orders placed during the hospital encounter of 05/24/19 MR LUMBAR SPINE WO  CONTRAST  Narrative CLINICAL DATA:  Low back pain worse on the left with pain radiating into the left leg. Left foot drop. Symptoms for several months. History of prostate cancer.  EXAM: MRI LUMBAR SPINE WITHOUT CONTRAST  TECHNIQUE: Multiplanar, multisequence MR imaging of the lumbar spine was performed. No intravenous contrast was administered.  COMPARISON:  None.  FINDINGS: Segmentation:  Standard.  Alignment: Minimal left convex curvature of the lumbar spine. No significant listhesis.  Vertebrae: No fracture or suspicious marrow lesion. Minimal Modic type 1 changes posteriorly at L3-4. Small hemangioma in the L3 vertebral body.  Conus medullaris and cauda equina: Conus extends to the upper L1 level. Conus and cauda equina appear normal.  Paraspinal and other soft tissues: 1.1 cm T2 hyperintense lesion in the medial upper pole of the left kidney compatible with a cyst as seen on 05/16/2019 abdominal MRI.  Disc levels:  Disc desiccation throughout the lumbar spine. Mild disc space narrowing from L1-2 to L3-4.  T12-L1 minimal disc bulging and endplate spurring without stenosis.  L1-2: Mild disc bulging and endplate spurring without stenosis.  L2-3: Mild disc bulging, mild endplate spurring, and mild facet hypertrophy without significant stenosis.  L3-4: Disc bulging, a left foraminal to extraforaminal disc protrusion, and mild-to-moderate facet and ligamentum flavum hypertrophy result in mild left lateral recess stenosis and mild right and moderate left neural foraminal stenosis with potential left L3 nerve root impingement. No spinal stenosis.  L4-5: Mild disc bulging, endplate spurring, and mild-to-moderate facet and ligamentum flavum hypertrophy result in mild left neural foraminal stenosis and mild bilateral lateral recess stenosis without significant spinal stenosis.  L5-S1: Mild facet hypertrophy without disc herniation or stenosis.  IMPRESSION: 1.  Multilevel lumbar disc and facet degeneration, most notable at L3-4 where there is moderate left neural foraminal stenosis and potential L3 nerve root impingement due to a disc protrusion. 2. Mild lateral recess and left neural foraminal stenosis at L4-5.   Electronically Signed By: Dasie Hamburg M.D. On: 05/24/2019 14:47  Knee Imaging: Knee-R DG 4 views: Results for orders placed in visit on 12/09/14 DG Knee Complete 4 Views Right  Narrative CLINICAL DATA:  Right medial knee pain and swelling for the past day without known trauma ; no similar prior symptoms.  EXAM: RIGHT KNEE - COMPLETE 4+ VIEW  COMPARISON:  None.  FINDINGS: The bones of the right knee are adequately mineralized. The joint spaces are preserved on these non standing views. There is a small spur from the inferior articular margin of the patella. There is beaking of the tibial spines. The patella is appropriately positioned in the patellar notch of the femur. There is a small suprapatellar effusion.  IMPRESSION: There is a small suprapatellar effusion. There is mild degenerative spurring. There is no acute bony abnormality of the right knee.   Electronically Signed By: David  [place] M.D. On: 12/09/2014 16:05  Hand Imaging: Hand-L DG Complete: Results for orders placed during the hospital encounter of 08/21/22 DG Hand Complete Left  Narrative CLINICAL DATA:  Trauma 4 days ago. Pain, particularly around the thumb.  EXAM: LEFT HAND - COMPLETE 3+ VIEW  COMPARISON:  None Available.  FINDINGS: Soft tissue calcification is identified between the base of the first metacarpal and trapezium. The primary calcification is well corticated and not thought to be acute. No donor site is identified to suggest a fracture. No acute fractures are noted. No dislocation. No other acute abnormalities.  IMPRESSION: The soft tissue calcification between the base of the first metacarpal and trapezium is favored to  represent sequela of a chronic process. No definitive acute fracture identified.   Electronically Signed By: Alm Pouch III M.D. On: 08/21/2022 08:12  Complexity Note: Imaging results reviewed.                         Meds   Current Outpatient Medications:    amLODipine  (NORVASC ) 5 MG tablet, Take 1 tablet (5 mg total) by mouth daily., Disp: 90 tablet, Rfl: 3   Cholecalciferol 125 MCG (5000 UT) capsule, Take 5,000 Units by mouth daily., Disp: , Rfl:    cyclobenzaprine  (FLEXERIL ) 10 MG tablet, Take 0.5-1 tablets (5-10 mg total)  by mouth at bedtime as needed., Disp: 15 tablet, Rfl: 0   ezetimibe  (ZETIA ) 10 MG tablet, Take 1 tablet (10 mg total) by mouth daily., Disp: 90 tablet, Rfl: 3   metoprolol  succinate (TOPROL -XL) 25 MG 24 hr tablet, TAKE 1 TABLET (25 MG TOTAL) BY MOUTH DAILY., Disp: 90 tablet, Rfl: 0   nitroGLYCERIN  (NITROSTAT ) 0.4 MG SL tablet, Place 1 tablet (0.4 mg total) under the tongue every 5 (five) minutes as needed for chest pain., Disp: 25 tablet, Rfl: 3   Omeprazole -Sodium Bicarbonate (ZEGERID) 20-1100 MG CAPS capsule, Take 1 capsule by mouth daily before breakfast., Disp: , Rfl:    phenazopyridine  (PYRIDIUM ) 200 MG tablet, Take 1 tab 3 times a day if needed., Disp: 30 tablet, Rfl: 1   rosuvastatin  (CRESTOR ) 40 MG tablet, Take 1 tablet (40 mg total) by mouth daily., Disp: 90 tablet, Rfl: 3   trimethoprim -polymyxin b  (POLYTRIM ) ophthalmic solution, Place 1 drop into both eyes every 4 (four) hours., Disp: 10 mL, Rfl: 0   pregabalin  (LYRICA ) 50 MG capsule, Take 1 capsule (50 mg total) by mouth at bedtime as needed., Disp: 30 capsule, Rfl: 0  ROS  Constitutional: Denies any fever or chills Gastrointestinal: No reported hemesis, hematochezia, vomiting, or acute GI distress Musculoskeletal: Denies any acute onset joint swelling, redness, loss of ROM, or weakness Neurological: No reported episodes of acute onset apraxia, aphasia, dysarthria, agnosia, amnesia, paralysis,  loss of coordination, or loss of consciousness  Allergies  Mr. Hyle is allergic to ibuprofen and gabapentin.  PFSH  Drug: Mr. Aull  reports no history of drug use. Alcohol:  reports no history of alcohol use. Tobacco:  reports that he has never smoked. He has never used smokeless tobacco. Medical:  has a past medical history of Anesthesia complication, Aortic atherosclerosis (HCC), Colon polyps, DVT (deep venous thrombosis) (HCC), ED (erectile dysfunction), Essential hypertension (06/25/2008), GERD (gastroesophageal reflux disease), HYPERLIPIDEMIA (07/24/2008), Hypersomnia (01/05/2017), Near syncope (03/14/2017), Neck pain (12/15/2011), OSA (obstructive sleep apnea) (11/13/2016), Prostate cancer (HCC), Sleep apnea (2018), and Thoracic ascending aortic aneurysm (HCC) (04/17/2017). Surgical: Mr. Montesinos  has a past surgical history that includes Esophagogastroduodenoscopy (10/02/2000); [facility]: Dehydration; vasvagal sync (12/09/ 05/2003); Plantar fascia surgery (Bilateral); Robot assisted laparoscopic radical prostatectomy (N/A, 05/27/2013); Colonoscopy; Polypectomy; and Upper gastrointestinal endoscopy. Family: family history includes Stroke in his mother.  Constitutional Exam  General appearance: Well nourished, well developed, and well hydrated. In no apparent acute distress Vitals:   03/11/24 0927  BP: 106/75  Pulse: (!) 56  Resp: 16  Temp: 98.1 F (36.7 C)  TempSrc: Temporal  SpO2: 98%  Weight: 195 lb (88.5 kg)  Height: 5' 9 (1.753 m)   BMI Assessment: Estimated body mass index is 28.8 kg/m as calculated from the following:   Height as of this encounter: 5' 9 (1.753 m).   Weight as of this encounter: 195 lb (88.5 kg).  BMI interpretation table: BMI level Category Range association with higher incidence of chronic pain  <18 kg/m2 Underweight   18.5-24.9 kg/m2 Ideal body weight   25-29.9 kg/m2 Overweight Increased incidence by 20%  30-34.9 kg/m2 Obese (Class I) Increased incidence  by 68%  35-39.9 kg/m2 Severe obesity (Class II) Increased incidence by 136%  >40 kg/m2 Extreme obesity (Class III) Increased incidence by 254%   Patient's current BMI Ideal Body weight  Body mass index is 28.8 kg/m. Ideal body weight: 70.7 kg (155 lb 13.8 oz) Adjusted ideal body weight: 77.8 kg (171 lb 8.3 oz)   BMI Readings from Last  4 Encounters:  03/11/24 28.80 kg/m  02/21/24 28.80 kg/m  01/19/24 28.70 kg/m  01/12/24 29.15 kg/m   Wt Readings from Last 4 Encounters:  03/11/24 195 lb (88.5 kg)  02/21/24 195 lb (88.5 kg)  01/19/24 194 lb 6 oz (88.2 kg)  01/12/24 197 lb 6.4 oz (89.5 kg)    Psych/Mental status: Alert, oriented x 3 (person, place, & time)       Eyes: PERLA Respiratory: No evidence of acute respiratory distress  Assessment & Plan  Primary Diagnosis & Pertinent Problem List: The primary encounter diagnosis was Chronic Penile pain. Diagnoses of Neurogenic pain, History of prostate cancer, History of penile lesion, and History of prostatectomy were also pertinent to this visit. Visit Diagnosis: 1. Chronic Penile pain   2. Neurogenic pain   3. History of prostate cancer   4. History of penile lesion   5. History of prostatectomy    Problems updated and reviewed during this visit: No problems updated.  Plan of Care  Assessment and Plan    Chronic penile pain   Chronic penile pain is associated with caffeinated beverages like Pepsi, coffee, and chocolate. Allergy  testing was negative for common allergens, including milk, codfish, wheat, corn, peanuts, soybeans, shellfish, pork, beef, chocolate, and eggs. The etiology remains unclear as no specific allergies were identified. The condition is not suitable for interventional pain management techniques such as injection therapies. Further investigation into the potential link with caffeine is suggested, although initial allergy  testing did not support this hypothesis. Transfer management of medications to the primary  care physician once well-managed. Schedule a follow-up phone call in 2-3 weeks to reassess symptoms and management plan.        Pharmacotherapy (Medications Ordered): Meds ordered this encounter  Medications   pregabalin  (LYRICA ) 50 MG capsule    Sig: Take 1 capsule (50 mg total) by mouth at bedtime as needed.    Dispense:  30 capsule    Refill:  0    Fill one day early if pharmacy is closed on scheduled refill date. May substitute for generic if available.   Procedure Orders    No procedure(s) ordered today   No orders of the defined types were placed in this encounter.  Lab Orders  No laboratory test(s) ordered today   Imaging Orders  No imaging studies ordered today   Referral Orders  No referral(s) requested today    Pharmacological management:  Opioid Analgesics: I will not be prescribing any opioids at this time Membrane stabilizer: I will not be prescribing any at this time Muscle relaxant: I will not be prescribing any at this time NSAID: I will not be prescribing any at this time Other analgesic(s): I will not be prescribing any at this time      Interventional Therapies  Risk Factors  Considerations  Medical Comorbidities:     Planned  Pending:      Under consideration:   Pending   Completed: (Analgesic benefit)1  (02/21/2024) lab work ordered.  Trial with Lyrica  25 mg p.o. at bedtime started.  If patient does well, will transfer to PCP.   Therapeutic  Palliative (PRN) options:   None established   Completed by other providers:   None reported  1(Analgesic benefit): Expressed in percentage (%). (Local anesthetic[LA] +/- sedation  L.A.Local Anesthetic  Steroid benefit  Ongoing benefit)      Provider-requested follow-up: Return in about 2 weeks (around 03/25/2024) for (VV), (MM) (Lyrica  titration). Recent Visits Date Type Provider Dept  02/21/24  Office Visit Tanya Glisson, MD [facility]  Showing recent visits within past 90  days and meeting all other requirements Today's Visits Date Type Provider Dept  03/11/24 Office Visit Tanya Glisson, MD [facility]  Showing today's visits and meeting all other requirements Future Appointments Date Type Provider Dept  03/26/24 Appointment Tanya Glisson, MD [facility]  Showing future appointments within next 90 days and meeting all other requirements   Primary Care Physician: Howard Arlyss RAMAN, MD  Duration of encounter: 34 minutes.  Total time on encounter, as per AMA guidelines included both the face-to-face and non-face-to-face time personally spent by the physician and/or other qualified health care professional(s) on the day of the encounter (includes time in activities that require the physician or other qualified health care professional and does not include time in activities normally performed by clinical staff). Physician's time may include the following activities when performed: Preparing to see the patient (e.g., pre-charting review of records, searching for previously ordered imaging, lab work, and nerve conduction tests) Review of prior analgesic pharmacotherapies. Reviewing PMP Interpreting ordered tests (e.g., lab work, imaging, nerve conduction tests) Performing post-procedure evaluations, including interpretation of diagnostic procedures Obtaining and/or reviewing separately obtained history Performing a medically appropriate examination and/or evaluation Counseling and educating the patient/family/caregiver Ordering medications, tests, or procedures Referring and communicating with other health care professionals (when not separately reported) Documenting clinical information in the electronic or other health record Independently interpreting results (not separately reported) and communicating results to the patient/ family/caregiver Care coordination (not separately reported)  Note by: Glisson DELENA Tanya, MD (TTS  technology used. I apologize for any typographical errors that were not detected and corrected.) Date: 03/11/2024; Time: 10:24 AM

## 2024-03-11 ENCOUNTER — Encounter: Payer: Self-pay | Admitting: Pain Medicine

## 2024-03-11 ENCOUNTER — Ambulatory Visit: Attending: Pain Medicine | Admitting: Pain Medicine

## 2024-03-11 VITALS — BP 106/75 | HR 56 | Temp 98.1°F | Resp 16 | Ht 69.0 in | Wt 195.0 lb

## 2024-03-11 DIAGNOSIS — G8929 Other chronic pain: Secondary | ICD-10-CM | POA: Insufficient documentation

## 2024-03-11 DIAGNOSIS — Z8546 Personal history of malignant neoplasm of prostate: Secondary | ICD-10-CM | POA: Insufficient documentation

## 2024-03-11 DIAGNOSIS — N489 Disorder of penis, unspecified: Secondary | ICD-10-CM | POA: Diagnosis not present

## 2024-03-11 DIAGNOSIS — N4889 Other specified disorders of penis: Secondary | ICD-10-CM | POA: Insufficient documentation

## 2024-03-11 DIAGNOSIS — Z9079 Acquired absence of other genital organ(s): Secondary | ICD-10-CM | POA: Diagnosis present

## 2024-03-11 DIAGNOSIS — M792 Neuralgia and neuritis, unspecified: Secondary | ICD-10-CM | POA: Insufficient documentation

## 2024-03-11 MED ORDER — PREGABALIN 50 MG PO CAPS: 50.0000 mg | ORAL_CAPSULE | Freq: Every evening | ORAL | 0 refills | Status: DC | PRN

## 2024-03-11 NOTE — Progress Notes (Signed)
 Safety precautions to be maintained throughout the outpatient stay will include: orient to surroundings, keep bed in low position, maintain call bell within reach at all times, provide assistance with transfer out of bed and ambulation.

## 2024-03-18 ENCOUNTER — Ambulatory Visit (HOSPITAL_COMMUNITY)
Admission: RE | Admit: 2024-03-18 | Discharge: 2024-03-18 | Disposition: A | Discharge status: Home / Self Care | Source: Ambulatory Visit | Attending: Cardiology | Admitting: Cardiology

## 2024-03-18 DIAGNOSIS — K8020 Calculus of gallbladder without cholecystitis without obstruction: Secondary | ICD-10-CM | POA: Insufficient documentation

## 2024-03-18 DIAGNOSIS — I7121 Aneurysm of the ascending aorta, without rupture: Secondary | ICD-10-CM | POA: Insufficient documentation

## 2024-03-18 MED ORDER — IOHEXOL 350 MG/ML SOLN
75.0000 mL | Freq: Once | INTRAVENOUS | Status: AC | PRN
  Administered 2024-03-18: 75 mL via INTRAVENOUS

## 2024-03-21 NOTE — Assessment & Plan Note (Signed)
 Right lower extremity DVT diagnosed in September 2024. ***

## 2024-03-21 NOTE — Progress Notes (Signed)
 "      OFFICE NOTE:    Date:  03/22/2024  ID:  Howard [name], DOB [birth year], MRN [id number] PCP: Cleatus Arlyss RAMAN, MD  [facility] Providers Cardiologist:  Oneil Parchment, MD Cardiology APP:  Lelon Glendia DASEN, PA-C       Hypertension  Thoracic aortic aneurysm CT Jul 21: 4.2 cm  CT 8/22: 4 cm CT 4/24: 4.1 cm Coronary artery disease  Myoview  02/16/2018: No ischemia, EF 50 CCTA 11/24/2022: Ascending aorta dilation 4.1 cm; CAC score 1364 (95th percentile), moderate CAD (proximal RCA 25-49, proximal-mid LAD 50-69, LCx calcifications >> FFR normal-med Rx Atrial fibrillation - noted on stress test in 2019 TTE 02/20/20: EF 60-65, no RWMA, mild LVH, GR 1 DD, normal RVSF, trivial MR, mild dilation of aortic root and ascending aorta (41 mm)  Monitor 02/22/2018: NSR, freq PACs, brief atrial runs Aortic atherosclerosis  Carotid US  04/16/2019: No significant stenosis bilaterally ABIs 08/11/2017: Normal Hepatic steatosis (? LFTs)/NASH  R leg DVT 04/2023        Discussed the use of AI scribe software for clinical note transcription with the patient, who gave verbal consent to proceed. History of Present Illness Howard [name] is a 73 y.o. male who returns for follow up of CAD, TAA, HTN. He was last seen in 06/2023.   He notes chest pain which has been present for several weeks, described as a 'streak' on the left side, occasionally radiating lower. The pain is intermittent, lasting a few seconds, and can occur while sitting. It is not related to exertion, meals, or swallowing. There are no associated symptoms such as jaw, arm, or back pain, shortness of breath, or exertional symptoms.     ROS-See HPI    Studies Reviewed:  EKG Interpretation Date/Time:  Friday March 22 2024 08:26:33 EDT Ventricular Rate:  63 PR Interval:  204 QRS Duration:  86 QT Interval:  406 QTC Calculation: 415 R Axis:   27  Text Interpretation: Sinus rhythm with Premature atrial complexes with Abberant  conduction Septal infarct , age undetermined No significant change since last tracing Confirmed by Lelon Glendia [phone]) on 03/22/2024 8:34:14 AM    Results LABS Total Cholesterol: 126 (06/28/2023) HDL: 46 (06/28/2023) LDL: 63 (06/28/2023) Triglycerides: 87 (06/28/2023) Hemoglobin: 14.9 (01/19/2024) Creatinine: 1.54 (01/19/2024) Potassium: 5.2 (01/19/2024) Magnesium: 2.2 (01/19/2024) TSH: 2.95 (01/19/2024) ALT: 9 (07/28/2023)           Physical Exam:  VS:  BP 110/60   Pulse 63   Ht 5' 9 (1.753 m)   Wt 195 lb 3.2 oz (88.5 kg)   SpO2 95%   BMI 28.83 kg/m        Wt Readings from Last 3 Encounters:  03/22/24 195 lb 3.2 oz (88.5 kg)  03/11/24 195 lb (88.5 kg)  02/21/24 195 lb (88.5 kg)    Constitutional:      Appearance: Healthy appearance. Not in distress.  Neck:     Vascular: No carotid bruit. JVD normal.  Pulmonary:     Breath sounds: Normal breath sounds. No wheezing. No rales.  Cardiovascular:     Normal rate. Regular rhythm.     Murmurs: There is no murmur.  Edema:    Peripheral edema absent.  Abdominal:     Palpations: Abdomen is soft.       Assessment and Plan:    Assessment & Plan Coronary artery disease involving native coronary artery of native heart without angina pectoris Precordial chest pain Moderate non-obstructive CAD confirmed by coronary  CTA in April 2024.  He has noted some occasional chest discomfort on the left side that is nonexertional.  This sounds noncardiac.  We discussed watchful waiting versus proceeding with stress testing.  I favor watchful waiting.  Through shared decision making, we decided to hold off on any further testing for now. - Continue ASA 81 mg daily, nitroglycerin  as needed, rosuvastatin  40 mg daily - Follow-up 1 year or sooner if chest abdomen should change Aneurysm of ascending aorta without rupture (HCC) Thoracic aortic aneurysm measuring 4.1 cm on CT in April 2024. Repeat CT done 03/18/24. Report is still pending.   Deep vein thrombosis (DVT) of lower extremity, unspecified chronicity, unspecified laterality, unspecified vein (HCC) Right lower extremity DVT diagnosed in September 2024.  He has completed anticoagulation therapy and is no longer on Eliquis . Essential hypertension Blood pressure controlled. - Continue amlodipine  5 mg daily, Toprol -XL 25 mg daily Hyperlipidemia LDL goal <70 Lipids optimal. - Continue rosuvastatin  40 mg daily, Zetia  10 mg daily         Dispo:  Return in about 1 year (around 03/22/2025) for Routine Follow Up, w/ Dr. Jeffrie, or Glendia Ferrier, PA-C.  Signed, Glendia Ferrier, PA-C   "

## 2024-03-21 NOTE — Assessment & Plan Note (Signed)
 Thoracic aortic aneurysm measuring 4.1 cm on CT in April 2024. Repeat CT done 03/18/24. Report is still pending. ***

## 2024-03-21 NOTE — Assessment & Plan Note (Signed)
 Moderate non-obstructive CAD confirmed by coronary CTA in April 2024. ***

## 2024-03-22 ENCOUNTER — Encounter: Payer: Self-pay | Admitting: Physician Assistant

## 2024-03-22 ENCOUNTER — Ambulatory Visit: Attending: Cardiology | Admitting: Physician Assistant

## 2024-03-22 VITALS — BP 110/60 | HR 63 | Ht 69.0 in | Wt 195.2 lb

## 2024-03-22 DIAGNOSIS — I2510 Atherosclerotic heart disease of native coronary artery without angina pectoris: Secondary | ICD-10-CM | POA: Diagnosis not present

## 2024-03-22 DIAGNOSIS — I7121 Aneurysm of the ascending aorta, without rupture: Secondary | ICD-10-CM | POA: Diagnosis not present

## 2024-03-22 DIAGNOSIS — R072 Precordial pain: Secondary | ICD-10-CM

## 2024-03-22 DIAGNOSIS — I82409 Acute embolism and thrombosis of unspecified deep veins of unspecified lower extremity: Secondary | ICD-10-CM

## 2024-03-22 DIAGNOSIS — I10 Essential (primary) hypertension: Secondary | ICD-10-CM

## 2024-03-22 DIAGNOSIS — E785 Hyperlipidemia, unspecified: Secondary | ICD-10-CM

## 2024-03-22 NOTE — Patient Instructions (Signed)
 Medication Instructions:  Your physician recommends that you continue on your current medications as directed. Please refer to the Current Medication list given to you today.  *If you need a refill on your cardiac medications before your next appointment, please call your pharmacy*  Lab Work: None ordered  If you have labs (blood work) drawn today and your tests are completely normal, you will receive your results only by: MyChart Message (if you have MyChart) OR A paper copy in the mail If you have any lab test that is abnormal or we need to change your treatment, we will call you to review the results.  Testing/Procedures: None ordered  Follow-Up: At [facility], you and your health needs are our priority.  As part of our continuing mission to provide you with exceptional heart care, our providers are all part of one team.  This team includes your primary Cardiologist (physician) and Advanced Practice Providers or APPs (Physician Assistants and Nurse Practitioners) who all work together to provide you with the care you need, when you need it.  Your next appointment:   1 year(s)  Provider:   Glendia Ferrier, PA-C          We recommend signing up for the patient portal called MyChart.  Sign up information is provided on this After Visit Summary.  MyChart is used to connect with patients for Virtual Visits (Telemedicine).  Patients are able to view lab/test results, encounter notes, upcoming appointments, etc.  Non-urgent messages can be sent to your provider as well.   To learn more about what you can do with MyChart, go to [email or web address].   Other Instructions Let us  know if your chest pain changes we will get you back in sooner

## 2024-03-22 NOTE — Assessment & Plan Note (Signed)
 Lipids optimal. - Continue rosuvastatin  40 mg daily, Zetia  10 mg daily

## 2024-03-22 NOTE — Assessment & Plan Note (Signed)
 Blood pressure controlled. - Continue amlodipine  5 mg daily, Toprol -XL 25 mg daily

## 2024-03-24 ENCOUNTER — Ambulatory Visit: Payer: Self-pay | Admitting: Physician Assistant

## 2024-03-24 DIAGNOSIS — I7121 Aneurysm of the ascending aorta, without rupture: Secondary | ICD-10-CM

## 2024-03-26 ENCOUNTER — Ambulatory Visit: Attending: Pain Medicine | Admitting: Pain Medicine

## 2024-03-26 DIAGNOSIS — N4889 Other specified disorders of penis: Secondary | ICD-10-CM

## 2024-03-26 DIAGNOSIS — M792 Neuralgia and neuritis, unspecified: Secondary | ICD-10-CM | POA: Diagnosis not present

## 2024-03-26 DIAGNOSIS — G8929 Other chronic pain: Secondary | ICD-10-CM

## 2024-03-26 MED ORDER — PREGABALIN 75 MG PO CAPS
75.0000 mg | ORAL_CAPSULE | Freq: Every day | ORAL | 0 refills | Status: DC
Start: 2024-03-26 — End: 2024-04-10

## 2024-03-26 NOTE — Progress Notes (Signed)
 PROVIDER NOTE: Interpretation of information contained herein should be left to medically-trained personnel. Specific patient instructions are provided elsewhere under Patient Instructions section of medical record. This document was created in part using AI and STT-dictation technology, any transcriptional errors that may result from this process are unintentional.  Patient: Howard [name]  Service: E/M   PCP: Cleatus Arlyss RAMAN, MD  DOB: 05-10-1951  DOS: 03/26/2024  Provider: Eric DELENA Como, MD  MRN: [id number]  Delivery: Virtual Visit  Specialty: Interventional Pain Management  Type: Established Patient  Setting: Ambulatory outpatient facility  Specialty designation: 09  Referring Prov.: Cleatus Arlyss RAMAN, MD  Location: Remote location       Virtual Encounter - Pain Management PROVIDER NOTE: Information contained herein reflects review and annotations entered in association with encounter. Interpretation of such information and data should be left to medically-trained personnel. Information provided to patient can be located elsewhere in the medical record under Patient Instructions. Document created using STT-dictation technology, any transcriptional errors that may result from process are unintentional.    Contact & Pharmacy Preferred: [phone] Home: [phone] (home) Mobile: [phone] (mobile) E-mail: [email or web address]  [id number] [place], [place] - [address] [place] [address] Phone: [phone] Fax: [phone]   Pre-screening  Howard [name] offered in-person vs virtual encounter. He indicated preferring virtual for this encounter.   Reason COVID-19*  Social distancing based on CDC and AMA recommendations.   I contacted Howard [name] on 03/26/2024 via telephone.      I clearly identified myself as Eric DELENA Como, MD. I verified that I was speaking with the correct person using two identifiers (Name: Howard [name], and date of birth: 07-25-1951).  Consent I sought verbal advanced consent from Howard [name] for virtual visit interactions. I informed Howard [name] of possible security and privacy concerns, risks, and limitations associated with providing not-in-person medical evaluation and management services. I also informed Howard [name] of the availability of in-person appointments. Finally, I informed him that there would be a charge for the virtual visit and that he could be  personally, fully or partially, financially responsible for it. Howard [name] expressed understanding and agreed to proceed.   Historic Elements   Howard [name] is a 73 y.o. year old, male patient evaluated today after our last contact on 03/11/2024. Howard [name]  has a past medical history of Anesthesia complication, Aortic atherosclerosis (HCC), Colon polyps, DVT (deep venous thrombosis) (HCC), ED (erectile dysfunction), Essential hypertension (06/25/2008), GERD (gastroesophageal reflux disease), HYPERLIPIDEMIA (07/24/2008), Hypersomnia (01/05/2017), Near syncope (03/14/2017), Neck pain (12/15/2011), OSA (obstructive sleep apnea) (11/13/2016), Prostate cancer [facility]), Sleep apnea (2018), and Thoracic ascending aortic aneurysm (HCC) (04/17/2017). He also  has a past surgical history that includes Esophagogastroduodenoscopy (10/02/2000); M[facility]: Dehydration; vasvagal sync (12/09/ 05/2003); Plantar fascia surgery (Bilateral); Robot assisted laparoscopic radical prostatectomy (N/A, 05/27/2013); Colonoscopy; Polypectomy; and Upper gastrointestinal endoscopy. Howard [name] has a current medication list which includes the following prescription(s): amlodipine , aspirin  ec, cholecalciferol, cyclobenzaprine , ezetimibe , metoprolol  succinate, nitroglycerin , omeprazole -sodium bicarbonate, phenazopyridine , pregabalin , pregabalin , rosuvastatin , and trimethoprim -polymyxin b . He  reports that he has never smoked. He has never used smokeless tobacco. He  reports that he does not drink alcohol and does not use drugs. Howard [name] is allergic to ibuprofen and gabapentin.  BMI: Estimated body mass index is 28.83 kg/m as calculated from the following:   Height as of 03/22/24: 5' 9 (1.753 m).   Weight as of 03/22/24: 195 lb 3.2 oz (88.5 kg). Last  encounter: 03/11/2024. Last procedure: Visit date not found.  HPI  Today, he is being contacted for medication management.  Howard [name] has a longstanding history of penile pain of unknown etiology but with a history of a prostatectomy secondary to cancer.  He was referred to us  for evaluation and to see if we could offer him anything for this intermittent, unpredictable, sharp pain that he feels in the head of his penis.  We recently started him on 03/11/2024 on pregabalin  (Lyrica ) 50 mg p.o. at bedtime with the idea to titrated up as tolerated.  Today were contacting the patient for follow-up and adjustment on the medication if needed.  The patient indicates doing well with the current medication regimen. No adverse reactions or side effects reported to the medications.   On 02/21/2024 the patient was started on Lyrica  25 mg p.o. at bedtime.  This was increased to 50 mg p.o. at bedtime on 03/11/2024 as he indicated that it seemed to be helping and he was not experiencing any type of side effects.  Today we have talked to him and he indicates that it does seem to help, but he still having the pain.  Today we will send another prescription to the pharmacy increasing the dose to 75 mg p.o. at bedtime and we will see him again in approximately 1 month to for follow-up and further titration if needed.  The plan was shared with the patient who understood and accepted.  Discussed the use of AI scribe software for clinical note transcription with the patient, who gave verbal consent to proceed.  History of Present Illness   Howard [name] is a 73 year old male who presents for follow-up regarding pain management with  Lyrica .  He experiences chronic pain that is partially alleviated by taking 50 mg of Lyrica  at night. This dosage reduces the intensity of his pain, though some discomfort remains. He adheres to the prescribed daily regimen without any issues.      Pharmacotherapy Assessment  Opioid Analgesic: No chronic opioid analgesics therapy prescribed by our practice. None MME/day: 0 mg/day   Monitoring: [place] PMP: PDMP reviewed during this encounter.       Pharmacotherapy: No side-effects or adverse reactions reported. Compliance: No problems identified. Effectiveness: Clinically acceptable. Plan: Refer to POC.  UDS:  Summary  Date Value Ref Range Status  02/21/2024 FINAL  Final    Comment:    ==================================================================== Compliance Drug Analysis, Ur ==================================================================== Test                             Result       Flag       Units  Drug Present and Declared for Prescription Verification   Metoprolol                      PRESENT      EXPECTED  Drug Present not Declared for Prescription Verification   Acetaminophen                   PRESENT      UNEXPECTED  Drug Absent but Declared for Prescription Verification   Pregabalin                      Not Detected UNEXPECTED   Cyclobenzaprine                 Not Detected UNEXPECTED ==================================================================== Test  Result    Flag   Units      Ref Range   Creatinine              207              mg/dL      >=79 ==================================================================== Declared Medications:  The flagging and interpretation on this report are based on the  following declared medications.  Unexpected results may arise from  inaccuracies in the declared medications.   **Note: The testing scope of this panel includes these medications:   Cyclobenzaprine  (Flexeril )  Metoprolol  (Toprol )   Pregabalin  (Lyrica )   **Note: The testing scope of this panel does not include the  following reported medications:   Amlodipine  (Norvasc )  Cholecalciferol  Ezetimibe  (Zetia )  Nitroglycerin  (Nitrostat )  Omeprazole  (Zegerid)  Phenazopyridine  (Pyridium )  Polymyxin B  (Polytrim )  Rosuvastatin  (Crestor )  Trimethoprim  (Polytrim ) ==================================================================== For clinical consultation, please call [phone]. ====================================================================    No results found for: CBDTHCR, D8THCCBX, D9THCCBX  Laboratory Chemistry Profile   Renal Lab Results  Component Value Date   BUN 20 01/19/2024   CREATININE 1.54 (H) 01/19/2024   BCR 13 01/19/2024   GFR 56.55 (L) 07/28/2023   GFRAA 68 03/02/2020   GFRNONAA 53 (L) 08/21/2022    Hepatic Lab Results  Component Value Date   AST 20 07/28/2023   ALT 9 07/28/2023   ALBUMIN 3.9 07/28/2023   ALKPHOS 79 07/28/2023   HCVAB NEGATIVE 08/27/2015    Electrolytes Lab Results  Component Value Date   NA 139 01/19/2024   K 5.2 01/19/2024   CL 101 01/19/2024   CALCIUM  9.9 01/19/2024   MG 2.2 01/19/2024    Bone Lab Results  Component Value Date   VD25OH 45.07 12/09/2021   25OHVITD1 33 02/21/2024   25OHVITD2 <1.0 02/21/2024   25OHVITD3 33 02/21/2024    Inflammation (CRP: Acute Phase) (ESR: Chronic Phase) Lab Results  Component Value Date   CRP <1 02/21/2024   ESRSEDRATE 12 02/21/2024         Note: Above Lab results reviewed.  Imaging  CT ANGIO CHEST AORTA W/CM & OR WO/CM CLINICAL DATA:  Follow-up thoracic aortic aneurysm.  EXAM: CT ANGIOGRAPHY CHEST WITH CONTRAST  TECHNIQUE: Multidetector CT imaging of the chest was performed using the standard protocol during bolus administration of intravenous contrast. Multiplanar CT image reconstructions and MIPs were obtained to evaluate the vascular anatomy.  RADIATION DOSE REDUCTION: This exam was  performed according to the departmental dose-optimization program which includes automated exposure control, adjustment of the mA and/or kV according to patient size and/or use of iterative reconstruction technique.  CONTRAST:  75mL OMNIPAQUE  IOHEXOL  350 MG/ML SOLN  COMPARISON:  11/24/2022  FINDINGS: Cardiovascular: Stable 4.1 cm ascending thoracic aortic aneurysm. No evidence of aortic dissection. Aortic and coronary atherosclerotic calcification incidentally noted. No pulmonary emboli identified.  Mediastinum/Nodes: No masses or pathologically enlarged lymph nodes identified.  Lungs/Pleura: No pulmonary mass, infiltrate, or effusion. Azygous fissure again noted.  Upper abdomen: Multiple gallstones seen, without signs of cholecystitis.  Musculoskeletal: No suspicious bone lesions identified.  Review of the MIP images confirms the above findings.  IMPRESSION: Stable 4.1 cm ascending thoracic aortic aneurysm. Recommend annual imaging followup by CTA or MRA. This recommendation follows 2010 ACCF/AHA/AATS/ACR/ASA/SCA/SCAI/SIR/STS/SVM Guidelines for the Diagnosis and Management of Patients with Thoracic Aortic Disease. Circulation. 2010; 121: Z733-z630. Aortic aneurysm NOS (ICD10-I71.9)  Cholelithiasis. No radiographic evidence of cholecystitis.  Electronically Signed   By: Norleen DELENA Kil M.D.   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  Plan of Care  Assessment and Plan    Chronic pain   Chronic pain persists with partial relief from Lyrica  (pregabalin ) 50 mg and no adverse effects. Increase Lyrica  to 75 mg at bedtime and continue daily administration to prevent pain.      Problem-specific:  No problem-specific Assessment & Plan notes found for this encounter.  Howard [name] has a current medication list which includes the  following long-term medication(s): amlodipine , ezetimibe , metoprolol  succinate, nitroglycerin , omeprazole -sodium bicarbonate, pregabalin , pregabalin , and rosuvastatin .  Pharmacotherapy (Medications Ordered): Meds ordered this encounter  Medications   pregabalin  (LYRICA ) 75 MG capsule    Sig: Take 1 capsule (75 mg total) by mouth at bedtime.    Dispense:  30 capsule    Refill:  0    Fill one day early if pharmacy is closed on scheduled refill date. May substitute for generic if available.   Orders:  No orders of the defined types were placed in this encounter.  Follow-up plan:   Return in about 30 days (around 04/25/2024) for Eval-day (M,W), (VV), (MM) Lyrica  titration.      Interventional Therapies  Risk Factors  Considerations  Medical Comorbidities:     Planned  Pending:      Under consideration:   Pending   Completed: (Analgesic benefit)1  (02/21/2024) patient started on a pregabalin  (Lyrica ) titration    Therapeutic  Palliative (PRN) options:   None established   Completed by other providers:   None reported  1(Analgesic benefit): Expressed in percentage (%). (Local anesthetic[LA] +/- sedation  L.A.Local Anesthetic  Steroid benefit  Ongoing benefit)      Recent Visits Date Type Provider Dept  03/11/24 Office Visit Tanya Glisson, MD [facility]  02/21/24 Office Visit Tanya Glisson, MD [facility]  Showing recent visits within past 90 days and meeting all other requirements Today's Visits Date Type Provider Dept  03/26/24 Office Visit Tanya Glisson, MD [facility]  Showing today's visits and meeting all other requirements Future Appointments No visits were found meeting these conditions. Showing future appointments within next 90 days and meeting all other requirements  I discussed the assessment and treatment plan with the patient. The patient was provided an opportunity to ask questions and all were answered.  The patient agreed with the plan and demonstrated an understanding of the instructions.  Patient advised to call back or seek an in-person evaluation if the symptoms or condition worsens.  Duration of encounter: 21 minutes.  Note by: Glisson DELENA Tanya, MD Date: 03/26/2024; Time: 2:56 PM

## 2024-04-07 ENCOUNTER — Other Ambulatory Visit: Payer: Self-pay | Admitting: Physician Assistant

## 2024-04-10 ENCOUNTER — Ambulatory Visit: Attending: Pain Medicine | Admitting: Pain Medicine

## 2024-04-10 ENCOUNTER — Encounter: Payer: Self-pay | Admitting: Pain Medicine

## 2024-04-10 VITALS — BP 129/75 | HR 61 | Temp 97.0°F | Resp 20 | Ht 69.0 in | Wt 195.0 lb

## 2024-04-10 DIAGNOSIS — G8929 Other chronic pain: Secondary | ICD-10-CM | POA: Diagnosis present

## 2024-04-10 DIAGNOSIS — G894 Chronic pain syndrome: Secondary | ICD-10-CM | POA: Insufficient documentation

## 2024-04-10 DIAGNOSIS — Z79899 Other long term (current) drug therapy: Secondary | ICD-10-CM | POA: Insufficient documentation

## 2024-04-10 DIAGNOSIS — M792 Neuralgia and neuritis, unspecified: Secondary | ICD-10-CM | POA: Diagnosis not present

## 2024-04-10 DIAGNOSIS — N4889 Other specified disorders of penis: Secondary | ICD-10-CM | POA: Diagnosis present

## 2024-04-10 MED ORDER — PREGABALIN 100 MG PO CAPS: 100.0000 mg | ORAL_CAPSULE | Freq: Every day | ORAL | 0 refills | Status: DC

## 2024-04-10 NOTE — Progress Notes (Signed)
 PROVIDER NOTE: Interpretation of information contained herein should be left to medically-trained personnel. Specific patient instructions are provided elsewhere under Patient Instructions section of medical record. This document was created in part using AI and STT-dictation technology, any transcriptional errors that may result from this process are unintentional.  Patient: Howard [name]  Service: E/M   PCP: Cleatus Arlyss RAMAN, MD  DOB: [birth year]  DOS: 04/10/2024  Provider: Eric DELENA Como, MD  MRN: [id number]  Delivery: Face-to-face  Specialty: Interventional Pain Management  Type: Established Patient  Setting: Ambulatory outpatient facility  Specialty designation: 09  Referring Prov.: Cleatus Arlyss RAMAN, MD  Location: Outpatient office facility       History of present illness (HPI) Howard [name], a 73 y.o. year old male, is here today because of his Penile pain, chronic [N48.89, G89.29]. [name] primary complain today is Penis Pain (Wants to talk about medication change )  Pertinent problems: Mr. [name] has Prostate cancer [facility]); Back pain; Knee pain; Metatarsalgia; Meningioma (HCC); Pain in left hand; Paresthesia; Precordial pain; Lumbar radiculopathy, right; Chronic Penile pain; Pudendal neuralgia; Muscle cramp; Chronic pain syndrome; History of prostate cancer; Abnormal MRI, lumbar spine (05/24/2019); Neurogenic pain; Food intolerance in adult (triggering penile pain); History of penile lesion; and Neuropathic pain on their pertinent problem list.  Pain Assessment: Severity of Chronic pain is reported as a 5 /10. Location: Penis Left/Denies. Onset: More than a month ago. Quality: Aching, Discomfort, Nagging. Timing: Constant. Modifying factor(s): Medications. Vitals:  height is 5' 9 (1.753 m) and weight is 195 lb (88.5 kg). His temperature is 97 F (36.1 C) (abnormal). His blood pressure is 129/75 and his pulse is 61. His respiration is 20 and oxygen saturation is 97%.  BMI:  Estimated body mass index is 28.8 kg/m as calculated from the following:   Height as of this encounter: 5' 9 (1.753 m).   Weight as of this encounter: 195 lb (88.5 kg).  Last encounter: 03/26/2024. Last procedure: Visit date not found.  Reason for encounter: medication management.  On 03/11/2024 the patient was started on pregabalin  50 mg at bedtime.  On 03/26/2024 the dose was increased to 75 mg at bedtime.  The patient comes into the clinic today for review and possible adjustment.  Prescription for pregabalin  75 mg capsule was recently filled on 03/27/2024.  Discussed the use of AI scribe software for clinical note transcription with the patient, who gave verbal consent to proceed.  History of Present Illness   Howard [name] is a 73 year old male who presents for adjustment of his pregabalin  dosage due to chronic penile pain. He has been referred by a urologist and a neurologist for management of his chronic pain.  He experiences chronic neuropathic penile pain. Pregabalin  was initially prescribed at 25 mg at bedtime, increased to 50 mg, and more recently to 75 mg. Despite these adjustments, the pain has returned to its previous intensity. He has self-adjusted the dose to 100 mg at bedtime using leftover 25 mg tablets, but this has not provided significant relief. He takes the medication consistently every night, which induces sleepiness without daytime drowsiness. No new symptoms or changes in his condition are present.  His pain management history includes evaluations by a urologist and a neurologist, with no cancer detected. Previous diagnostic workups, including a CT scan, have not identified a specific cause for his pain. His chronic pain has persisted for over three months.      Pharmacotherapy Assessment   Opioid  Analgesic: No chronic opioid analgesics therapy prescribed by our practice. None MME/day: 0 mg/day   Monitoring: [place] PMP: PDMP reviewed during this encounter.        Pharmacotherapy: No side-effects or adverse reactions reported. Compliance: No problems identified. Effectiveness: Clinically acceptable.  No notes on file  UDS:  Summary  Date Value Ref Range Status  02/21/2024 FINAL  Final    Comment:    ==================================================================== Compliance Drug Analysis, Ur ==================================================================== Test                             Result       Flag       Units  Drug Present and Declared for Prescription Verification   Metoprolol                      PRESENT      EXPECTED  Drug Present not Declared for Prescription Verification   Acetaminophen                   PRESENT      UNEXPECTED  Drug Absent but Declared for Prescription Verification   Pregabalin                      Not Detected UNEXPECTED   Cyclobenzaprine                 Not Detected UNEXPECTED ==================================================================== Test                      Result    Flag   Units      Ref Range   Creatinine              207              mg/dL      >=79 ==================================================================== Declared Medications:  The flagging and interpretation on this report are based on the  following declared medications.  Unexpected results may arise from  inaccuracies in the declared medications.   **Note: The testing scope of this panel includes these medications:   Cyclobenzaprine  (Flexeril )  Metoprolol  (Toprol )  Pregabalin  (Lyrica )   **Note: The testing scope of this panel does not include the  following reported medications:   Amlodipine  (Norvasc )  Cholecalciferol  Ezetimibe  (Zetia )  Nitroglycerin  (Nitrostat )  Omeprazole  (Zegerid)  Phenazopyridine  (Pyridium )  Polymyxin B  (Polytrim )  Rosuvastatin  (Crestor )  Trimethoprim  (Polytrim ) ==================================================================== For clinical consultation, please call [phone]. ====================================================================     No results found for: CBDTHCR No results found for: D8THCCBX No results found for: D9THCCBX  ROS  Constitutional: Denies any fever or chills Gastrointestinal: No reported hemesis, hematochezia, vomiting, or acute GI distress Musculoskeletal: Denies any acute onset joint swelling, redness, loss of ROM, or weakness Neurological: No reported episodes of acute onset apraxia, aphasia, dysarthria, agnosia, amnesia, paralysis, loss of coordination, or loss of consciousness  Medication Review  Cholecalciferol, Omeprazole -Sodium Bicarbonate, amLODipine , aspirin  EC, cyclobenzaprine , ezetimibe , metoprolol  succinate, nitroGLYCERIN , phenazopyridine , pregabalin , rosuvastatin , and trimethoprim -polymyxin b   History Review  Allergy : Mr. Scallon is allergic to ibuprofen and gabapentin. Drug: Mr. Withrow  reports no history of drug use. Alcohol:  reports no history of alcohol use. Tobacco:  reports that he has never smoked. He has never used smokeless tobacco. Social: Mr. Staples  reports that he has never smoked. He has never used smokeless tobacco. He reports that he does not drink alcohol and does not use drugs. Medical:  has a past medical history of Anesthesia complication, Aortic atherosclerosis (HCC), Colon polyps, DVT (deep venous thrombosis) (HCC), ED (erectile dysfunction), Essential hypertension (06/25/2008), GERD (gastroesophageal reflux disease), HYPERLIPIDEMIA (07/24/2008), Hypersomnia (01/05/2017), Near syncope (03/14/2017), Neck pain (12/15/2011), OSA (obstructive sleep apnea) (11/13/2016), Prostate cancer [facility]), Sleep apnea (2018), and Thoracic ascending aortic aneurysm (HCC) (04/17/2017). Surgical: Mr. Arkwright  has a past surgical history that includes Esophagogastroduodenoscopy (10/02/2000); [facility]: Dehydration; vasvagal sync (12/09/ 05/2003); Plantar fascia surgery (Bilateral); Robot assisted laparoscopic radical  prostatectomy (N/A, 05/27/2013); Colonoscopy; Polypectomy; and Upper gastrointestinal endoscopy. Family: family history includes Stroke in his mother.  Laboratory Chemistry Profile   Renal Lab Results  Component Value Date   BUN 20 01/19/2024   CREATININE 1.54 (H) 01/19/2024   BCR 13 01/19/2024   GFR 56.55 (L) 07/28/2023   GFRAA 68 03/02/2020   GFRNONAA 53 (L) 08/21/2022    Hepatic Lab Results  Component Value Date   AST 20 07/28/2023   ALT 9 07/28/2023   ALBUMIN 3.9 07/28/2023   ALKPHOS 79 07/28/2023   HCVAB NEGATIVE 08/27/2015    Electrolytes Lab Results  Component Value Date   NA 139 01/19/2024   K 5.2 01/19/2024   CL 101 01/19/2024   CALCIUM  9.9 01/19/2024   MG 2.2 01/19/2024    Bone Lab Results  Component Value Date   VD25OH 45.07 12/09/2021   25OHVITD1 33 02/21/2024   25OHVITD2 <1.0 02/21/2024   25OHVITD3 33 02/21/2024    Inflammation (CRP: Acute Phase) (ESR: Chronic Phase) Lab Results  Component Value Date   CRP <1 02/21/2024   ESRSEDRATE 12 02/21/2024         Note: Above Lab results reviewed.  Recent Imaging Review  CT ANGIO CHEST AORTA W/CM & OR WO/CM CLINICAL DATA:  Follow-up thoracic aortic aneurysm.  EXAM: CT ANGIOGRAPHY CHEST WITH CONTRAST  TECHNIQUE: Multidetector CT imaging of the chest was performed using the standard protocol during bolus administration of intravenous contrast. Multiplanar CT image reconstructions and MIPs were obtained to evaluate the vascular anatomy.  RADIATION DOSE REDUCTION: This exam was performed according to the departmental dose-optimization program which includes automated exposure control, adjustment of the mA and/or kV according to patient size and/or use of iterative reconstruction technique.  CONTRAST:  75mL OMNIPAQUE  IOHEXOL  350 MG/ML SOLN  COMPARISON:  11/24/2022  FINDINGS: Cardiovascular: Stable 4.1 cm ascending thoracic aortic aneurysm. No evidence of aortic dissection. Aortic and coronary  atherosclerotic calcification incidentally noted. No pulmonary emboli identified.  Mediastinum/Nodes: No masses or pathologically enlarged lymph nodes identified.  Lungs/Pleura: No pulmonary mass, infiltrate, or effusion. Azygous fissure again noted.  Upper abdomen: Multiple gallstones seen, without signs of cholecystitis.  Musculoskeletal: No suspicious bone lesions identified.  Review of the MIP images confirms the above findings.  IMPRESSION: Stable 4.1 cm ascending thoracic aortic aneurysm. Recommend annual imaging followup by CTA or MRA. This recommendation follows 2010 ACCF/AHA/AATS/ACR/ASA/SCA/SCAI/SIR/STS/SVM Guidelines for the Diagnosis and Management of Patients with Thoracic Aortic Disease. Circulation. 2010; 121: Z733-z630. Aortic aneurysm NOS (ICD10-I71.9)  Cholelithiasis. No radiographic evidence of cholecystitis.  Electronically Signed   By: Norleen DELENA Kil M.D.   On: 03/24/2024 11:43 Note: Reviewed        Physical Exam  Vitals: BP 129/75   Pulse 61   Temp (!) 97 F (36.1 C)   Resp 20   Ht 5' 9 (1.753 m)   Wt 195 lb (88.5 kg)   SpO2 97%   BMI 28.80 kg/m  BMI: Estimated body mass index is 28.8 kg/m as calculated from  the following:   Height as of this encounter: 5' 9 (1.753 m).   Weight as of this encounter: 195 lb (88.5 kg). Ideal: Ideal body weight: 70.7 kg (155 lb 13.8 oz) Adjusted ideal body weight: 77.8 kg (171 lb 8.3 oz) General appearance: Well nourished, well developed, and well hydrated. In no apparent acute distress Mental status: Alert, oriented x 3 (person, place, & time)       Respiratory: No evidence of acute respiratory distress Eyes: PERLA   Assessment   Diagnosis Status  1. Chronic Penile pain   2. Chronic pain syndrome   3. Neurogenic pain   4. Neuropathic pain   5. Pharmacologic therapy    Persistent Persistent Persistent   Updated Problems: No problems updated.  Plan of Care  Problem-specific:  Assessment and  Plan    Chronic neuropathic penile pain   He experiences persistent and increasing pain localized to the head of the penis despite the current pregabalin  regimen. Evaluations by urologists and neurologists have not identified an etiology, and there is no evidence of cancer or other serious conditions. Pain management focuses on reducing intensity and frequency rather than complete elimination. Discussed pregabalin  pharmacokinetics, emphasizing consistent dosing and the time required for therapeutic blood levels. Rapid dose escalation can lead to increased side effects and poor tolerance. Continue pregabalin  100 mg at bedtime. Educate on the importance of consistent daily dosing and allowing three weeks for dose adjustments to take effect. Avoid rapid dose escalation to prevent side effects. Schedule follow-up in three weeks to reassess pain and medication efficacy. Send prescription for pregabalin  100 mg to [facility]. Consider referral to a neurologist if further evaluation is needed.       Mr. Gonsalo Cuthbertson has a current medication list which includes the following long-term medication(s): amlodipine , ezetimibe , metoprolol  succinate, nitroglycerin , omeprazole -sodium bicarbonate, pregabalin , and rosuvastatin .  Pharmacotherapy (Medications Ordered): Meds ordered this encounter  Medications   pregabalin  (LYRICA ) 100 MG capsule    Sig: Take 1 capsule (100 mg total) by mouth at bedtime.    Dispense:  30 capsule    Refill:  0    Fill one day early if pharmacy is closed on scheduled refill date. May substitute for generic if available.   Orders:  No orders of the defined types were placed in this encounter.    Interventional Therapies  Risk Factors  Considerations  Medical Comorbidities:     Planned  Pending:      Under consideration:      Completed: (Analgesic benefit)1  (02/21/2024) patient started on a pregabalin  (Lyrica ) titration    Therapeutic  Palliative (PRN) options:    None   Completed by other providers:   None reported  1(Analgesic benefit): Expressed in percentage (%). (Local anesthetic[LA] +/- sedation  L.A.Local Anesthetic  Steroid benefit  Ongoing benefit)     Return in about 3 weeks (around 05/01/2024) for Eval-day (M,W), (VV), (MM) Lyrica  titration.    Recent Visits Date Type Provider Dept  03/26/24 Office Visit Tanya Glisson, MD [facility]  03/11/24 Office Visit Tanya Glisson, MD [facility]  02/21/24 Office Visit Tanya Glisson, MD [facility]  Showing recent visits within past 90 days and meeting all other requirements Today's Visits Date Type Provider Dept  04/10/24 Office Visit Tanya Glisson, MD [facility]  Showing today's visits and meeting all other requirements Future Appointments Date Type Provider Dept  04/29/24 Appointment Tanya Glisson, MD [facility]  Showing future appointments within  next 90 days and meeting all other requirements  I discussed the assessment and treatment plan with the patient. The patient was provided an opportunity to ask questions and all were answered. The patient agreed with the plan and demonstrated an understanding of the instructions.  Patient advised to call back or seek an in-person evaluation if the symptoms or condition worsens.  Duration of encounter: 30 minutes.  Total time on encounter, as per AMA guidelines included both the face-to-face and non-face-to-face time personally spent by the physician and/or other qualified health care professional(s) on the day of the encounter (includes time in activities that require the physician or other qualified health care professional and does not include time in activities normally performed by clinical staff). Physician's time may include the following activities when performed: Preparing to see the patient (e.g., pre-charting review of records, searching for previously ordered  imaging, lab work, and nerve conduction tests) Review of prior analgesic pharmacotherapies. Reviewing PMP Interpreting ordered tests (e.g., lab work, imaging, nerve conduction tests) Performing post-procedure evaluations, including interpretation of diagnostic procedures Obtaining and/or reviewing separately obtained history Performing a medically appropriate examination and/or evaluation Counseling and educating the patient/family/caregiver Ordering medications, tests, or procedures Referring and communicating with other health care professionals (when not separately reported) Documenting clinical information in the electronic or other health record Independently interpreting results (not separately reported) and communicating results to the patient/ family/caregiver Care coordination (not separately reported)  Note by: Eric DELENA Como, MD (TTS and AI technology used. I apologize for any typographical errors that were not detected and corrected.) Date: 04/10/2024; Time: 12:31 PM

## 2024-04-24 ENCOUNTER — Telehealth: Admitting: Pain Medicine

## 2024-04-28 NOTE — Progress Notes (Signed)
 PROVIDER NOTE: Interpretation of information contained herein should be left to medically-trained personnel. Specific patient instructions are provided elsewhere under Patient Instructions section of medical record. This document was created in part using AI and STT-dictation technology, any transcriptional errors that may result from this process are unintentional.  Patient: Howard [name]  Service: E/M   PCP: Howard Arlyss RAMAN, MD  DOB: [name]  DOS: 04/29/2024  Provider: Eric DELENA Como, MD  MRN: [id number]  Delivery: Virtual Visit  Specialty: Interventional Pain Management  Type: Established Patient  Setting: Ambulatory outpatient facility  Specialty designation: 09  Referring Prov.: Howard Arlyss RAMAN, MD  Location: Remote location       Virtual Encounter - Pain Management PROVIDER NOTE: Information contained herein reflects review and annotations entered in association with encounter. Interpretation of such information and data should be left to medically-trained personnel. Information provided to patient can be located elsewhere in the medical record under Patient Instructions. Document created using STT-dictation technology, any transcriptional errors that may result from process are unintentional.    Contact & Pharmacy Preferred: [phone] Home: [phone] (home) Mobile: [phone] (mobile) E-mail: [email or web address]  [id number] [place], [place] - [address] [place] [address] Phone: [phone] Fax: [phone]   Pre-screening  [name] offered in-person vs virtual encounter. He indicated preferring virtual for this encounter.   Reason COVID-19*  Social distancing based on CDC and AMA recommendations.   I contacted Howard [name] on 04/29/2024 via telephone.      I clearly identified myself as Howard DELENA Como, MD. I verified that I was speaking with the correct person using two identifiers (Name: Howard [name], and date of birth: Howard [name]).  Consent I sought verbal advanced consent from Howard [name] for virtual visit interactions. I informed Howard [name] of possible security and privacy concerns, risks, and limitations associated with providing not-in-person medical evaluation and management services. I also informed Howard [name] of the availability of in-person appointments. Finally, I informed him that there would be a charge for the virtual visit and that he could be  personally, fully or partially, financially responsible for it. Howard [name] expressed understanding and agreed to proceed.   Historic Elements   Howard [name] is a 73 y.o. year old, male patient evaluated today after our last contact on 04/10/2024. Howard [name]  has a past medical history of Anesthesia complication, Aortic atherosclerosis (HCC), Howard polyps, DVT (deep venous thrombosis) (HCC), ED (erectile dysfunction), Essential hypertension (06/25/2008), GERD (gastroesophageal reflux disease), HYPERLIPIDEMIA (07/24/2008), Hypersomnia (01/05/2017), Near syncope (03/14/2017), Neck pain (12/15/2011), OSA (obstructive sleep apnea) (11/13/2016), Prostate cancer [facility]), Sleep apnea (2018), and Thoracic ascending aortic aneurysm (HCC) (04/17/2017). He also  has a past surgical history that includes Esophagogastroduodenoscopy (10/02/2000); [facility]: Dehydration; vasvagal sync (12/09/ 05/2003); Plantar fascia surgery (Bilateral); Robot assisted laparoscopic radical prostatectomy (N/A, 05/27/2013); Colonoscopy; Polypectomy; and Upper gastrointestinal endoscopy. Howard [name] has a current medication list which includes the following prescription(s): amlodipine , aspirin  ec, cholecalciferol, cyclobenzaprine , ezetimibe , metoprolol  succinate, nitroglycerin , omeprazole -sodium bicarbonate, phenazopyridine , pregabalin , pregabalin , rosuvastatin , and trimethoprim -polymyxin b . He  reports that he has never smoked. He has never used smokeless tobacco. He  reports that he does not drink alcohol and does not use drugs. Howard [name] is allergic to ibuprofen and gabapentin.  BMI: Estimated body mass index is 28.8 kg/m as calculated from the following:   Height as of 04/10/24: 5' 9 (1.753 m).   Weight as of 04/10/24: 195 lb (88.5 kg). Last encounter: 04/10/2024.  Last procedure: Visit date not found.  HPI  Today, he is being contacted for medication management.  The patient indicates that he has reached 100 mg p.o. at bedtime and he really cannot tell the difference in terms of the intensity or the frequency of the episodes.  He is also not having any side effects of the medication therefore we will continue to go up.  He still has some of the 100 mg tablets and some of the 25 mg tablets and therefore he has been instructed to go up to 125 mg p.o. at bedtime.  I have instructed him to continue on that until he runs out of the pills at which time he will have a refill in the pharmacy for the 150 mg to be taken p.o. at bedtime.  I will call him back in approximately 4 weeks to see if he has had some improvement.  We talked about continuing to increase it up to 450 mg/day.  Should we get to that dose send he is still not having any benefits, we will consider tapering it down and discontinuing the medication.  He has been instructed to continue monitoring for benefits and/or side effects or adverse reactions.  The patient understood and accepted.   Pharmacotherapy Assessment  Opioid Analgesic: No chronic opioid analgesics therapy prescribed by our practice. None MME/day: 0 mg/day   Monitoring: [place] PMP: PDMP reviewed during this encounter.       Pharmacotherapy: No side-effects or adverse reactions reported. Compliance: No problems identified. Effectiveness: Clinically acceptable. Plan: Refer to POC.  UDS:  Summary  Date Value Ref Range Status  02/21/2024 FINAL  Final    Comment:     ==================================================================== Compliance Drug Analysis, Ur ==================================================================== Test                             Result       Flag       Units  Drug Present and Declared for Prescription Verification   Metoprolol                      PRESENT      EXPECTED  Drug Present not Declared for Prescription Verification   Acetaminophen                   PRESENT      UNEXPECTED  Drug Absent but Declared for Prescription Verification   Pregabalin                      Not Detected UNEXPECTED   Cyclobenzaprine                 Not Detected UNEXPECTED ==================================================================== Test                      Result    Flag   Units      Ref Range   Creatinine              207              mg/dL      >=79 ==================================================================== Declared Medications:  The flagging and interpretation on this report are based on the  following declared medications.  Unexpected results may arise from  inaccuracies in the declared medications.   **Note: The testing scope of this panel includes these medications:   Cyclobenzaprine  (Flexeril )  Metoprolol  (Toprol )  Pregabalin  (Lyrica )   **Note: The  testing scope of this panel does not include the  following reported medications:   Amlodipine  (Norvasc )  Cholecalciferol  Ezetimibe  (Zetia )  Nitroglycerin  (Nitrostat )  Omeprazole  (Zegerid)  Phenazopyridine  (Pyridium )  Polymyxin B  (Polytrim )  Rosuvastatin  (Crestor )  Trimethoprim  (Polytrim ) ==================================================================== For clinical consultation, please call [phone]. ====================================================================    No results found for: CBDTHCR, D8THCCBX, D9THCCBX  Laboratory Chemistry Profile   Renal Lab Results  Component Value Date   BUN 20 01/19/2024   CREATININE 1.54  (H) 01/19/2024   BCR 13 01/19/2024   GFR 56.55 (L) 07/28/2023   GFRAA 68 03/02/2020   GFRNONAA 53 (L) 08/21/2022    Hepatic Lab Results  Component Value Date   AST 20 07/28/2023   ALT 9 07/28/2023   ALBUMIN 3.9 07/28/2023   ALKPHOS 79 07/28/2023   HCVAB NEGATIVE 08/27/2015    Electrolytes Lab Results  Component Value Date   NA 139 01/19/2024   K 5.2 01/19/2024   CL 101 01/19/2024   CALCIUM  9.9 01/19/2024   MG 2.2 01/19/2024    Bone Lab Results  Component Value Date   VD25OH 45.07 12/09/2021   25OHVITD1 33 02/21/2024   25OHVITD2 <1.0 02/21/2024   25OHVITD3 33 02/21/2024    Inflammation (CRP: Acute Phase) (ESR: Chronic Phase) Lab Results  Component Value Date   CRP <1 02/21/2024   ESRSEDRATE 12 02/21/2024         Note: Above Lab results reviewed.  Imaging  CT ANGIO CHEST AORTA W/CM & OR WO/CM CLINICAL DATA:  Follow-up thoracic aortic aneurysm.  EXAM: CT ANGIOGRAPHY CHEST WITH CONTRAST  TECHNIQUE: Multidetector CT imaging of the chest was performed using the standard protocol during bolus administration of intravenous contrast. Multiplanar CT image reconstructions and MIPs were obtained to evaluate the vascular anatomy.  RADIATION DOSE REDUCTION: This exam was performed according to the departmental dose-optimization program which includes automated exposure control, adjustment of the mA and/or kV according to patient size and/or use of iterative reconstruction technique.  CONTRAST:  75mL OMNIPAQUE  IOHEXOL  350 MG/ML SOLN  COMPARISON:  11/24/2022  FINDINGS: Cardiovascular: Stable 4.1 cm ascending thoracic aortic aneurysm. No evidence of aortic dissection. Aortic and coronary atherosclerotic calcification incidentally noted. No pulmonary emboli identified.  Mediastinum/Nodes: No masses or pathologically enlarged lymph nodes identified.  Lungs/Pleura: No pulmonary mass, infiltrate, or effusion. Azygous fissure again noted.  Upper abdomen: Multiple  gallstones seen, without signs of cholecystitis.  Musculoskeletal: No suspicious bone lesions identified.  Review of the MIP images confirms the above findings.  IMPRESSION: Stable 4.1 cm ascending thoracic aortic aneurysm. Recommend annual imaging followup by CTA or MRA. This recommendation follows 2010 ACCF/AHA/AATS/ACR/ASA/SCA/SCAI/SIR/STS/SVM Guidelines for the Diagnosis and Management of Patients with Thoracic Aortic Disease. Circulation. 2010; 121: Z733-z630. Aortic aneurysm NOS (ICD10-I71.9)  Cholelithiasis. No radiographic evidence of cholecystitis.  Electronically Signed   By: Norleen DELENA Kil M.D.   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 Diagnoses of Chronic pain syndrome, Neurogenic pain, Neuropathic pain, and Pharmacologic therapy were also pertinent to this visit.  Plan of Care  Problem-specific:  No problem-specific Assessment & Plan notes found for this encounter.  Mr. Tylin Force has a current medication list which includes the following long-term medication(s): amlodipine , ezetimibe , metoprolol  succinate, nitroglycerin , omeprazole -sodium bicarbonate, pregabalin , pregabalin , and rosuvastatin .  Pharmacotherapy (Medications Ordered): Meds ordered this encounter  Medications   pregabalin  (LYRICA ) 150 MG capsule    Sig: Take 1 capsule (150 mg total) by mouth at bedtime.    Dispense:  30 capsule    Refill:  0    Fill one day early if pharmacy is closed on scheduled refill date. May substitute for generic if available.   Orders:  No orders of the defined types were placed in this encounter.  Follow-up plan:   Return in about 1 month (around 05/29/2024) for Eval-day (M,W), (VV), (MM) Lyrica  titration.      Interventional Therapies  Risk Factors  Considerations  Medical Comorbidities:     Planned  Pending:      Under consideration:      Completed: (Analgesic benefit)1  (02/21/2024) patient  started on a pregabalin  (Lyrica ) titration    Therapeutic  Palliative (PRN) options:   None   Completed by other providers:   None reported  1(Analgesic benefit): Expressed in percentage (%). (Local anesthetic[LA] +/- sedation  L.A.Local Anesthetic  Steroid benefit  Ongoing benefit)      Recent Visits Date Type Provider Dept  04/10/24 Office Visit Tanya Glisson, MD [facility]  03/26/24 Office Visit Tanya Glisson, MD [facility]  03/11/24 Office Visit Tanya Glisson, MD [facility]  02/21/24 Office Visit Tanya Glisson, MD [facility]  Showing recent visits within past 90 days and meeting all other requirements Today's Visits Date Type Provider Dept  04/29/24 Office Visit Tanya Glisson, MD [facility]  Showing today's visits and meeting all other requirements Future Appointments No visits were found meeting these conditions. Showing future appointments within next 90 days and meeting all other requirements  I discussed the assessment and treatment plan with the patient. The patient was provided an opportunity to ask questions and all were answered. The patient agreed with the plan and demonstrated an understanding of the instructions.  Patient advised to call back or seek an in-person evaluation if the symptoms or condition worsens.  Duration of encounter: 20 minutes.  Note by: Glisson DELENA Tanya, MD Date: 04/29/2024; Time: 4:02 PM

## 2024-04-29 ENCOUNTER — Ambulatory Visit: Attending: Pain Medicine | Admitting: Pain Medicine

## 2024-04-29 DIAGNOSIS — M792 Neuralgia and neuritis, unspecified: Secondary | ICD-10-CM | POA: Diagnosis not present

## 2024-04-29 DIAGNOSIS — N4889 Other specified disorders of penis: Secondary | ICD-10-CM

## 2024-04-29 DIAGNOSIS — Z79899 Other long term (current) drug therapy: Secondary | ICD-10-CM | POA: Diagnosis not present

## 2024-04-29 DIAGNOSIS — G894 Chronic pain syndrome: Secondary | ICD-10-CM | POA: Diagnosis not present

## 2024-04-29 DIAGNOSIS — G8929 Other chronic pain: Secondary | ICD-10-CM

## 2024-04-29 MED ORDER — PREGABALIN 150 MG PO CAPS: 150.0000 mg | ORAL_CAPSULE | Freq: Every day | ORAL | 0 refills | Status: DC

## 2024-05-09 ENCOUNTER — Ambulatory Visit

## 2024-05-09 VITALS — BP 129/75 | Ht 69.0 in | Wt 197.0 lb

## 2024-05-09 DIAGNOSIS — Z0000 Encounter for general adult medical examination without abnormal findings: Secondary | ICD-10-CM | POA: Diagnosis not present

## 2024-05-09 NOTE — Patient Instructions (Signed)
 Mr. Howard [name],  Thank you for taking the time for your Medicare Wellness Visit. I appreciate your continued commitment to your health goals. Please review the care plan we discussed, and feel free to reach out if I can assist you further.  Medicare recommends these wellness visits once per year to help you and your care team stay ahead of potential health issues. These visits are designed to focus on prevention, allowing your provider to concentrate on managing your acute and chronic conditions during your regular appointments.  Please note that Annual Wellness Visits do not include a physical exam. Some assessments may be limited, especially if the visit was conducted virtually. If needed, we may recommend a separate in-person follow-up with your provider.  Ongoing Care Seeing your primary care provider every 3 to 6 months helps us  monitor your health and provide consistent, personalized care. =  Referrals If a referral was made during today's visit and you haven't received any updates within two weeks, please contact the referred provider directly to check on the status.  Recommended Screenings:  Health Maintenance  Topic Date Due   Zoster (Shingles) Vaccine (1 of 2) [birth year]/27/1968   DTaP/Tdap/Td vaccine (2 - Tdap) 06/25/2018   Colon Cancer Screening  06/20/2022   Flu Shot  03/15/2024   Medicare Annual Wellness Visit  05/09/2025   Pneumococcal Vaccine for age over 35  Completed   Hepatitis C Screening  Completed   HPV Vaccine  Aged Out   Meningitis B Vaccine  Aged Out   COVID-19 Vaccine  Discontinued       05/09/2024    9:52 AM  Advanced Directives  Does Patient Have a Medical Advance Directive? No  Would patient like information on creating a medical advance directive? No - Patient declined   Advance Care Planning is important because it: Ensures you receive medical care that aligns with your values, goals, and preferences. Provides guidance to your family and loved ones, reducing  the emotional burden of decision-making during critical moments.  Vision: Annual vision screenings are recommended for early detection of glaucoma, cataracts, and diabetic retinopathy. These exams can also reveal signs of chronic conditions such as diabetes and high blood pressure.  Dental: Annual dental screenings help detect early signs of oral cancer, gum disease, and other conditions linked to overall health, including heart disease and diabetes.  Please see the attached documents for additional preventive care recommendations.

## 2024-05-09 NOTE — Progress Notes (Signed)
 Because this visit was a virtual/telehealth visit,  certain criteria was not obtained, such a blood pressure, CBG if applicable, and timed get up and go. Any medications not marked as taking were not mentioned during the medication reconciliation part of the visit. Any vitals not documented were not able to be obtained due to this being a telehealth visit or patient was unable to self-report a recent blood pressure reading due to a lack of equipment at home via telehealth. Vitals that have been documented are verbally provided by the patient.   This visit was performed by a medical professional under my direct supervision. I was immediately available for consultation/collaboration. I have reviewed and agree with the Annual Wellness Visit documentation.  Subjective:   Howard [name] is a 73 y.o. who presents for a Medicare Wellness preventive visit.  As a reminder, Annual Wellness Visits don't include a physical exam, and some assessments may be limited, especially if this visit is performed virtually. We may recommend an in-person follow-up visit with your provider if needed.  Visit Complete: Virtual I connected with  Howard [name] on 05/09/24 by a audio enabled telemedicine application and verified that I am speaking with the correct person using two identifiers.  Patient Location: Home  Provider Location: Home Office  I discussed the limitations of evaluation and management by telemedicine. The patient expressed understanding and agreed to proceed.  Vital Signs: Because this visit was a virtual/telehealth visit, some criteria may be missing or patient reported. Any vitals not documented were not able to be obtained and vitals that have been documented are patient reported.  VideoDeclined- This patient declined Librarian, academic. Therefore the visit was completed with audio only.  Persons Participating in Visit: Patient.  AWV Questionnaire: No:  Patient Medicare AWV questionnaire was not completed prior to this visit.  Cardiac Risk Factors include: advanced age (>33men, >73 women);male gender;hypertension     Objective:    Today's Vitals   05/09/24 0948 05/09/24 0949  BP: 129/75   Weight: 197 lb (89.4 kg)   Height: 5' 9 (1.753 m)   PainSc:  7    Body mass index is 29.09 kg/m.     05/09/2024    9:52 AM 04/10/2024   11:13 AM 03/11/2024    9:32 AM 04/26/2023    5:25 PM 08/21/2022    7:09 AM 05/07/2022   11:20 AM 11/17/2021    1:19 PM  Advanced Directives  Does Patient Have a Medical Advance Directive? No No No No No No No  Would patient like information on creating a medical advance directive? No - Patient declined No - Patient declined No - Patient declined  No - Patient declined No - Patient declined     Current Medications (verified) Outpatient Encounter Medications as of 05/09/2024  Medication Sig   amLODipine  (NORVASC ) 5 MG tablet Take 1 tablet (5 mg total) by mouth daily.   aspirin  EC 81 MG tablet Take 81 mg by mouth daily. Swallow whole.   Cholecalciferol 125 MCG (5000 UT) capsule Take 5,000 Units by mouth daily.   cyclobenzaprine  (FLEXERIL ) 10 MG tablet Take 0.5-1 tablets (5-10 mg total) by mouth at bedtime as needed.   ezetimibe  (ZETIA ) 10 MG tablet TAKE 1 TABLET BY MOUTH EVERY DAY   metoprolol  succinate (TOPROL -XL) 25 MG 24 hr tablet TAKE 1 TABLET (25 MG TOTAL) BY MOUTH DAILY.   nitroGLYCERIN  (NITROSTAT ) 0.4 MG SL tablet Place 1 tablet (0.4 mg total) under the tongue every 5 (  five) minutes as needed for chest pain.   Omeprazole -Sodium Bicarbonate (ZEGERID) 20-1100 MG CAPS capsule Take 1 capsule by mouth daily before breakfast.   phenazopyridine  (PYRIDIUM ) 200 MG tablet Take 1 tab 3 times a day if needed.   pregabalin  (LYRICA ) 100 MG capsule Take 1 capsule (100 mg total) by mouth at bedtime.   pregabalin  (LYRICA ) 150 MG capsule Take 1 capsule (150 mg total) by mouth at bedtime.   rosuvastatin  (CRESTOR ) 40 MG tablet  Take 1 tablet (40 mg total) by mouth daily.   trimethoprim -polymyxin b  (POLYTRIM ) ophthalmic solution Place 1 drop into both eyes every 4 (four) hours.   No facility-administered encounter medications on file as of 05/09/2024.    Allergies (verified) Ibuprofen and Gabapentin   History: Past Medical History:  Diagnosis Date   Anesthesia complication    prolonged sedation after colonoscopy   Aortic atherosclerosis    CT 02/2020   Colon polyps    on colonoscopy 08/2010   DVT (deep venous thrombosis) [facility])    ED (erectile dysfunction)    Essential hypertension 06/25/2008   Qualifier: Diagnosis of  By: Bartley MD, Lamar Mulch    GERD (gastroesophageal reflux disease)    HYPERLIPIDEMIA 07/24/2008   Qualifier: Diagnosis of  By: Bartley MD, Lamar Mulch    Hypersomnia 01/05/2017   Near syncope 03/14/2017   Neck pain 12/15/2011   OSA (obstructive sleep apnea) 11/13/2016   Dr. Jude   Prostate cancer [facility])    s/p prostatectomy   Sleep apnea 2018   Thoracic ascending aortic aneurysm 04/17/2017   Chest CTA 10/18: Aorta 4.1 cm// Echo 8/18:  - 4.3 cm. Aortic arch is 3.3 cm // CT 01/2019: aorta 3.8 cm; sinuses of Valsalva 4.5 cm // Chest CTA 7/21: TAA 4.2 cm, cholelithiasis, aortic atherosclerosis // Chest CTA 8/22: Ascending thoracic aorta 4 cm, coronary calcification, cholelithiasis, aortic atherosclerosis//Chest CTA 8/22: Ascending thoracic aorta 4 cm   Past Surgical History:  Procedure Laterality Date   COLONOSCOPY     ESOPHAGOGASTRODUODENOSCOPY  10/02/2000   dilated reflux esopsh [facility]   [facility]: Dehydration; vasvagal sync  12/09/ 05/2003   PLANTAR FASCIA SURGERY Bilateral    POLYPECTOMY     ROBOT ASSISTED LAPAROSCOPIC RADICAL PROSTATECTOMY N/A 05/27/2013   Procedure: ROBOTIC ASSISTED LAPAROSCOPIC RADICAL PROSTATECTOMY LEVEL 1;  Surgeon: Noretta Ferrara, MD;  Location: [facility];  Service: Urology;  Laterality: N/A;   UPPER GASTROINTESTINAL ENDOSCOPY     Family History  Problem Relation Age of  Onset   Stroke Mother    Prostate cancer Neg Hx    Colon cancer Neg Hx    Aortic dissection Neg Hx    Esophageal cancer Neg Hx    Colon polyps Neg Hx    Rectal cancer Neg Hx    Stomach cancer Neg Hx    Social History   Socioeconomic History   Marital status: Married    Spouse name: Not on file   Number of children: 3   Years of education: 12   Highest education level: Not on file  Occupational History   Occupation: Retired    Comment: Hospital doctor Dept  Tobacco Use   Smoking status: Never   Smokeless tobacco: Never  Vaping Use   Vaping status: Never Used  Substance and Sexual Activity   Alcohol use: No    Alcohol/week: 0.0 standard drinks of alcohol    Comment: no   Drug use: No   Sexual activity: Not on file  Other Topics Concern  Not on file  Social History Narrative   Retired [employer], parttime bailiff- laid off as of 2018.    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     Married 1976, lives with wife. 1 step daughter, 2 daughters and 1 son.   [place] [employer] fan   Left handed   Social Drivers of Health   Financial Resource Strain: Low Risk  (05/09/2024)   Overall Financial Resource Strain (CARDIA)    Difficulty of Paying Living Expenses: Not hard at all  Food Insecurity: No Food Insecurity (05/09/2024)   Hunger Vital Sign    Worried About Running Out of Food in the Last Year: Never true    Ran Out of Food in the Last Year: Never true  Transportation Needs: No Transportation Needs (05/09/2024)   PRAPARE - Administrator, Civil Service (Medical): No    Lack of Transportation (Non-Medical): No  Physical Activity: Insufficiently Active (05/09/2024)   Exercise Vital Sign    Days of Exercise per Week: 3 days    Minutes of Exercise per Session: 40 min  Stress: No Stress Concern Present (05/09/2024)   [employer] of Occupational Health - Occupational Stress Questionnaire    Feeling of Stress: Not at all  Social Connections: Moderately  Integrated (05/09/2024)   Social Connection and Isolation Panel    Frequency of Communication with Friends and Family: More than three times a week    Frequency of Social Gatherings with Friends and Family: More than three times a week    Attends Religious Services: More than 4 times per year    Active Member of [employer] or Organizations: No    Attends Engineer, structural: Never    Marital Status: Married    Tobacco Counseling Counseling given: Not Answered    Clinical Intake:  Pre-visit preparation completed: Yes  Pain : 0-10 Pain Score: 7  Pain Type: Chronic pain Pain Location: Knee Pain Orientation: Right Pain Descriptors / Indicators: Aching Pain Onset: Today Pain Frequency: Several days a week     BMI - recorded: 29.09 Nutritional Status: BMI 25 -29 Overweight Nutritional Risks: None Diabetes: No  Lab Results  Component Value Date   HGBA1C 6.3 10/13/2022     How often do you need to have someone help you when you read instructions, pamphlets, or other written materials from your doctor or pharmacy?: 1 - Never  Interpreter Needed?: No  Information entered by :: Herlinda Heady,CMA   Activities of Daily Living     05/09/2024    9:52 AM  In your present state of health, do you have any difficulty performing the following activities:  Hearing? 0  Vision? 0  Difficulty concentrating or making decisions? 0  Walking or climbing stairs? 0  Dressing or bathing? 0  Doing errands, shopping? 0  Preparing Food and eating ? N  Using the Toilet? N  In the past six months, have you accidently leaked urine? N  Do you have problems with loss of bowel control? N  Managing your Medications? N  Managing your Finances? N  Housekeeping or managing your Housekeeping? N    Patient Care Team: Cleatus Arlyss RAMAN, MD as PCP - General (Family Medicine) Jeffrie Oneil BROCKS, MD as PCP - Cardiology (Cardiology) Patel, Donika K, DO as Consulting Physician (Neurology) Lelon Glendia ONEIDA DEVONNA as Physician Assistant (Cardiology) Renda Glance, MD as Consulting Physician (Urology)  I have updated your Care Teams any recent Medical Services you may have received from other  providers in the past year.     Assessment:   This is a routine wellness examination for Fraser.  Hearing/Vision screen Hearing Screening - Comments:: No difficulties Vision Screening - Comments:: Patient wears glasses    Goals Addressed             This Visit's Progress    Patient Stated   On track    Would like to maintain current routine       Depression Screen     05/09/2024    9:54 AM 05/09/2024    9:53 AM 04/10/2024   11:13 AM 03/11/2024    9:32 AM 05/11/2023    2:35 PM 04/04/2023    8:09 AM 12/15/2022    8:27 AM  PHQ 2/9 Scores  PHQ - 2 Score 0 0 0 0 0 0 0  PHQ- 9 Score 0 0   0 0 0    Fall Risk     05/09/2024    9:52 AM 04/10/2024   11:13 AM 03/11/2024    9:32 AM 01/19/2024    2:44 PM 05/11/2023    2:35 PM  Fall Risk   Falls in the past year? 0 0 0 0 0  Number falls in past yr: 0 0 0 0 0  Injury with Fall? 0 0 0 0 0  Risk for fall due to : No Fall Risks   History of fall(s) No Fall Risks  Follow up Falls evaluation completed   Falls evaluation completed Falls evaluation completed    MEDICARE RISK AT HOME:  Medicare Risk at Home Any stairs in or around the home?: No If so, are there any without handrails?: No Home free of loose throw rugs in walkways, pet beds, electrical cords, etc?: Yes Adequate lighting in your home to reduce risk of falls?: Yes Life alert?: No Use of a cane, walker or w/c?: No Grab bars in the bathroom?: Yes Shower chair or bench in shower?: No Elevated toilet seat or a handicapped toilet?: Yes  TIMED UP AND GO:  Was the test performed?  No  Cognitive Function: 6CIT completed    06/20/2019    9:15 AM 11/30/2017   10:44 AM 11/25/2016    1:13 PM  MMSE - Mini Mental State Exam  Orientation to time 5 5 5    Orientation to Place 5 5 5     Registration 3 3 3    Attention/ Calculation 5 0 0   Recall 3 2 3    Recall-comments  unable to recall 1 of 3 words   Language- name 2 objects  0 0   Language- repeat 1 1 1   Language- follow 3 step command  3 3   Language- read & follow direction  0 0   Write a sentence  0 0   Copy design  0 0   Total score  19 20      Data saved with a previous flowsheet row definition        05/09/2024    9:50 AM  6CIT Screen  What Year? 0 points  What month? 0 points  What time? 0 points  Count back from 20 0 points  Months in reverse 0 points  Repeat phrase 0 points  Total Score 0 points    Immunizations Immunization History  Administered Date(s) Administered   Fluad Quad(high Dose 65+) 07/04/2019, 05/11/2020   Fluad Trivalent(High Dose 65+) 05/11/2023   INFLUENZA, HIGH DOSE SEASONAL PF 06/02/2017   Influenza Split 06/08/2011   Influenza,inj,Quad PF,6+  Mos 04/20/2016, 06/21/2018   Moderna Sars-Covid-2 Vaccination 09/28/2019, 10/26/2019   Pneumococcal Conjugate-13 08/27/2015   Pneumococcal Polysaccharide-23 12/01/2016   Td 06/25/2008   Zoster, Live 07/05/2010    Screening Tests Health Maintenance  Topic Date Due   Zoster Vaccines- Shingrix (1 of 2) 12/27/1968   DTaP/Tdap/Td (2 - Tdap) 06/25/2018   Colonoscopy  06/20/2022   Influenza Vaccine  03/15/2024   Medicare Annual Wellness (AWV)  05/09/2025   Pneumococcal Vaccine: 50+ Years  Completed   Hepatitis C Screening  Completed   HPV VACCINES  Aged Out   Meningococcal B Vaccine  Aged Out   COVID-19 Vaccine  Discontinued    Health Maintenance Items Addressed:patient declined   Additional Screening:  Vision Screening: Recommended annual ophthalmology exams for early detection of glaucoma and other disorders of the eye. Is the patient up to date with their annual eye exam?  No  Who is the provider or what is the name of the office in which the patient attends annual eye exams?   Dental Screening: Recommended annual dental  exams for proper oral hygiene  Community Resource Referral / Chronic Care Management: CRR required this visit?  No   CCM required this visit?  No   Plan:    I have personally reviewed and noted the following in the patient's chart:   Medical and social history Use of alcohol, tobacco or illicit drugs  Current medications and supplements including opioid prescriptions. Patient is not currently taking opioid prescriptions. Functional ability and status Nutritional status Physical activity Advanced directives List of other physicians Hospitalizations, surgeries, and ER visits in previous 12 months Vitals Screenings to include cognitive, depression, and falls Referrals and appointments  In addition, I have reviewed and discussed with patient certain preventive protocols, quality metrics, and best practice recommendations. A written personalized care plan for preventive services as well as general preventive health recommendations were provided to patient.   Lyle MARLA Right, [place]   05/09/2024   After Visit Summary: (MyChart) Due to this being a telephonic visit, the after visit summary with patients personalized plan was offered to patient via MyChart   Notes: Nothing significant to report at this time.

## 2024-05-10 ENCOUNTER — Telehealth: Payer: Self-pay

## 2024-05-10 DIAGNOSIS — G4733 Obstructive sleep apnea (adult) (pediatric): Secondary | ICD-10-CM

## 2024-05-10 NOTE — Telephone Encounter (Signed)
 Do you want to do this or do you want to refer him to pulmonary?

## 2024-05-10 NOTE — Telephone Encounter (Signed)
 Copied from CRM [id number]. Topic: Clinical - Medical Advice >> May 09, 2024 10:09 AM Debby BROCKS wrote: Reason for CRM: Patient uses a Cpap machine but it is 10-74 years old. He would like to know what can he do to get an updated version

## 2024-05-12 NOTE — Telephone Encounter (Signed)
 I think it makes sense to see pulmonary and I put in the referral.  Let me know if he has trouble getting an appointment.  Thanks.

## 2024-05-12 NOTE — Addendum Note (Signed)
 Addended by: CLEATUS ARLYSS RAMAN on: 05/12/2024 04:16 PM   Modules accepted: Orders

## 2024-05-13 ENCOUNTER — Ambulatory Visit: Payer: Self-pay

## 2024-05-13 NOTE — Telephone Encounter (Signed)
 Patient notified

## 2024-05-13 NOTE — Telephone Encounter (Signed)
 FYI Only or Action Required?: FYI only for provider.  Patient was last seen in primary care on 01/19/2024 by Howard Greig BRAVO, MD.  Called Nurse Triage reporting Penis Pain.  Symptoms began several days ago.  Interventions attempted: OTC medications: pregabalin , aspirin .  Symptoms are: unchanged.  Triage Disposition: See PCP When Office is Open (Within 3 Days)  Patient/caregiver understands and will follow disposition?: Yes   Copied from CRM #[id number]. Topic: Clinical - Red Word Triage >> May 13, 2024 11:23 AM Antony RAMAN wrote: Red Word that prompted transfer to Nurse Triage: having pain in the tip of penis, and knee pain as well. Doctor has been treating the pain in penis with medicine but its not working anymore Reason for Disposition  MODERATE-SEVERE itching (e.g., interferes with work, school, sleep, or other activities)  Answer Assessment - Initial Assessment Questions 1. SYMPTOM: What's the main symptom you're concerned about? (e.g., blood in semen, discharge or pus from penis, itching, pain, rash, swelling)     Pain to the penis  2. LOCATION: Where is the pain located?     Tip of the penis  3. ONSET: When did pain  start?     Last couple of weeks  4. PAIN: Is there any pain? If Yes, ask: How bad is it?  (Scale 1-10; or mild, moderate, severe)     7  5. URINE: Any difficulty passing urine? If Yes, ask: When was the last time?     No  6. CAUSE: What do you think is causing the symptoms?     Unknown  7. OTHER SYMPTOMS: Do you have any other symptoms? (e.g., blood in urine, abdomen pain, fever)     No  Protocols used: Penis and Scrotum Symptoms-A-AH

## 2024-05-13 NOTE — Telephone Encounter (Signed)
 Noted. Thanks.

## 2024-05-14 ENCOUNTER — Ambulatory Visit: Admitting: Family Medicine

## 2024-05-14 ENCOUNTER — Ambulatory Visit: Payer: Self-pay | Admitting: Family Medicine

## 2024-05-14 ENCOUNTER — Encounter: Payer: Self-pay | Admitting: Family Medicine

## 2024-05-14 VITALS — BP 122/80 | HR 62 | Temp 97.8°F | Ht 69.0 in | Wt 203.2 lb

## 2024-05-14 DIAGNOSIS — M25561 Pain in right knee: Secondary | ICD-10-CM | POA: Insufficient documentation

## 2024-05-14 DIAGNOSIS — Z23 Encounter for immunization: Secondary | ICD-10-CM

## 2024-05-14 DIAGNOSIS — G8929 Other chronic pain: Secondary | ICD-10-CM | POA: Diagnosis not present

## 2024-05-14 DIAGNOSIS — R300 Dysuria: Secondary | ICD-10-CM

## 2024-05-14 DIAGNOSIS — N4889 Other specified disorders of penis: Secondary | ICD-10-CM | POA: Diagnosis not present

## 2024-05-14 LAB — POC URINALSYSI DIPSTICK (AUTOMATED)
Bilirubin, UA: NEGATIVE
Blood, UA: NEGATIVE
Glucose, UA: NEGATIVE
Ketones, UA: NEGATIVE
Leukocytes, UA: NEGATIVE
Nitrite, UA: NEGATIVE
Protein, UA: NEGATIVE
Spec Grav, UA: 1.025 (ref 1.010–1.025)
Urobilinogen, UA: 0.2 U/dL
pH, UA: 5.5 (ref 5.0–8.0)

## 2024-05-14 MED ORDER — AMITRIPTYLINE HCL 10 MG PO TABS: ORAL_TABLET | ORAL | 0 refills | Status: DC

## 2024-05-14 NOTE — Assessment & Plan Note (Signed)
 OA exacerbation vs MCL sprain.  Supportive measures recommended - topical voltaren gel, tylenol , knee sleeve brace, provided with exercises from SM pt advisor.  Update if not improving with treatment.

## 2024-05-14 NOTE — Progress Notes (Signed)
 Ph: [phone] Fax: [phone]   Patient ID: Howard [name], male    DOB: 04/14/1951, 73 y.o.   MRN: [id number]  This visit was conducted in person.  BP 122/80   Pulse 62   Temp 97.8 F (36.6 C) (Oral)   Ht 5' 9 (1.753 m)   Wt 203 lb 4 oz (92.2 kg)   SpO2 94%   BMI 30.01 kg/m    CC: penile pain  Subjective:   HPI: Rollan Roger is a 73 y.o. male presenting on 05/14/2024 for Penis Pain (C/o penis pain. Started 2 yrs ago. H/o penis pain. ) and Knee Injury (C/o R knee pain after stepping up on curb and heard a pop about 2 wks ago. 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 Has been evaluated by urology Dr Renda (reassuring cystoscopy) and more recently Dr Tanya (pain management), current regimen is Lyrica  150mg  nightly, pyridium  200mg  PRN.  Describes penile discomfort to left side where shaft meets glands, at its worst 8/10 pain, now with new burning with urination. Best when he awakens in the morning, then progressively worsens as day goes on - first pain occurs while showering.  6 months after it started he developed sore to area treated with nystatin  followed by clotirmazole/betamethasone  without significant improvement - this got better with self care. No further sores have developed.  Previously pyridium  helped, however it is no longer effective.  Last saw urology 04/19/2024 - samples of Uribel were not helpful either.  Pain is present constantly, daily. At night no pain. Can be positional - hurts to move.  Pain started with pepsi intake - but has persisted despite stoppping sodas. Also notes lemonade is a trigger. Already avoids caffeine and spicy foods.  Thinks he's had reassuring cystoscopy.   No urinary urgency, frequency, hematuria, fever/chills, nausea/vomiting, or flank pain.  No h/o kidney stones.  Also notes R knee pain for the past month, acutely worse after stepping up onto curb 1 week ago heard  pop with pain to posterior knee - needed to use crutch for a few days but it is improving. Treating with bayer aspirin  250mg  at home followed by tylenol . No redness/swelling. Denies inciting trauma/injury or falls. No knee instability or locking of knee      Relevant past medical, surgical, family and social history reviewed and updated as indicated. Interim medical history since our last visit reviewed. Allergies and medications reviewed and updated. Outpatient Medications Prior to Visit  Medication Sig Dispense Refill   amLODipine  (NORVASC ) 5 MG tablet Take 1 tablet (5 mg total) by mouth daily. 90 tablet 3   aspirin  EC 81 MG tablet Take 81 mg by mouth daily. Swallow whole.     Cholecalciferol 125 MCG (5000 UT) capsule Take 5,000 Units by mouth daily.     ezetimibe  (ZETIA ) 10 MG tablet TAKE 1 TABLET BY MOUTH EVERY DAY 90 tablet 3   metoprolol  succinate (TOPROL -XL) 25 MG 24 hr tablet TAKE 1 TABLET (25 MG TOTAL) BY MOUTH DAILY. 90 tablet 0   nitroGLYCERIN  (NITROSTAT ) 0.4 MG SL tablet Place 1 tablet (0.4 mg total) under the tongue every 5 (five) minutes as needed for chest pain. 25 tablet 3   Omeprazole -Sodium Bicarbonate (ZEGERID) 20-1100 MG CAPS capsule Take 1 capsule by mouth daily before breakfast.     phenazopyridine  (PYRIDIUM ) 200 MG tablet Take 1 tab 3 times a day if needed. 30 tablet  1   pregabalin  (LYRICA ) 150 MG capsule Take 1 capsule (150 mg total) by mouth at bedtime. 30 capsule 0   rosuvastatin  (CRESTOR ) 40 MG tablet Take 1 tablet (40 mg total) by mouth daily. 90 tablet 3   cyclobenzaprine  (FLEXERIL ) 10 MG tablet Take 0.5-1 tablets (5-10 mg total) by mouth at bedtime as needed. 15 tablet 0   pregabalin  (LYRICA ) 100 MG capsule Take 1 capsule (100 mg total) by mouth at bedtime. 30 capsule 0   trimethoprim -polymyxin b  (POLYTRIM ) ophthalmic solution Place 1 drop into both eyes every 4 (four) hours. 10 mL 0   No facility-administered medications prior to visit.     Per HPI unless  specifically indicated in ROS section below Review of Systems  Objective:  BP 122/80   Pulse 62   Temp 97.8 F (36.6 C) (Oral)   Ht 5' 9 (1.753 m)   Wt 203 lb 4 oz (92.2 kg)   SpO2 94%   BMI 30.01 kg/m   Wt Readings from Last 3 Encounters:  05/14/24 203 lb 4 oz (92.2 kg)  05/09/24 197 lb (89.4 kg)  04/10/24 195 lb (88.5 kg)      Physical Exam Vitals and nursing note reviewed.  Constitutional:      Appearance: Normal appearance. He is not ill-appearing.  Abdominal:     Hernia: There is no hernia in the left inguinal area or right inguinal area.  Genitourinary:    Pubic Area: No rash.      Penis: Circumcised. Tenderness present. No erythema, discharge, swelling or lesions.      Testes: Normal.        Right: Mass, tenderness or swelling not present. Right testis is descended.        Left: Mass, tenderness or swelling not present. Left testis is descended.     Epididymis:     Right: Normal.     Left: Normal.     Comments: Reproducible tenderness to left edge of glans of penis without appreciable skin changes or swelling/mass  Musculoskeletal:        General: Tenderness present. Normal range of motion.     Right lower leg: No edema.     Left lower leg: No edema.     Comments:  L knee WNL R knee exam: No deformity on inspection. Mild swelling with discomfort to palpation at medial collateral ligament No effusion/swelling noted. FROM in flex/extension with mild crepitus. No popliteal fullness. Neg drawer test. Neg mcmurray test. Discomfort with valgus stress. No PFgrind. No abnormal patellar mobility.   Lymphadenopathy:     Lower Body: No right inguinal adenopathy. No left inguinal adenopathy.  Skin:    General: Skin is warm and dry.     Findings: No rash.  Neurological:     Mental Status: He is alert.  Psychiatric:        Mood and Affect: Mood normal.        Behavior: Behavior normal.       Results for orders placed or performed in visit on 05/14/24  POCT  Urinalysis Dipstick (Automated)   Collection Time: 05/14/24 12:24 PM  Result Value Ref Range   Color, UA dark yellow    Clarity, UA clear    Glucose, UA Negative Negative   Bilirubin, UA negative    Ketones, UA negative    Spec Grav, UA 1.025 1.010 - 1.025   Blood, UA negative    pH, UA 5.5 5.0 - 8.0   Protein, UA Negative Negative  Urobilinogen, UA 0.2 0.2 or 1.0 E.U./dL   Nitrite, UA negative    Leukocytes, UA Negative Negative    Assessment & Plan:   Problem List Items Addressed This Visit     Dysuria   Update UA - normal. Continues PRN pyridium  with limited benefit.       Relevant Orders   POCT Urinalysis Dipstick (Automated) (Completed)   Chronic Penile pain - Primary (Chronic)   Chronic ongoing issue, s/p multiple specialist evaluation (urology, PM&R), treatment to date has not been effective.  He continues Lyrica  150mg  nightly through pain clinic.  He continues pyridium  200mg  PRN.  Uribel sample through urology didn't help.  Reassuring exam today.  Story/exam consistent with neuropathic pain, he describes h/o sore to penis a year ago that didn't respond to antifungal or steroid treatment - ?post-herpetic neuralgia. He notes initial response to dietary changes (stopping sodas, caffeine) however nota nymore - ?component of interstitial cystitis however UAs have been normal.   I touched base with PCP. Will add amitriptyline 10mg  nightly with option to increase to 20mg  nightly with drug precautions. Update PCP with effect.       Right medial knee pain   OA exacerbation vs MCL sprain.  Supportive measures recommended - topical voltaren gel, tylenol , knee sleeve brace, provided with exercises from [facility] pt advisor.  Update if not improving with treatment.       Other Visit Diagnoses       Need for influenza vaccination       Relevant Orders   Flu vaccine HIGH DOSE PF(Fluzone Trivalent) (Completed)        No orders of the defined types were placed in this  encounter.   Orders Placed This Encounter  Procedures   Flu vaccine HIGH DOSE PF(Fluzone Trivalent)   POCT Urinalysis Dipstick (Automated)    Patient Instructions  Urinalysis today  For penis pain - I will touch base with Dr Cleatus on possible trial of amitriptyline at night.  Knee is looking ok today - may continue tylenol , use knee sleeve brace for extra support . Try exercises provided today for medial collateral ligament sprain. May use topical voltaren over the counter three times daily as needed.   Follow up plan: Return if symptoms worsen or fail to improve.  Anton Blas, MD

## 2024-05-14 NOTE — Assessment & Plan Note (Addendum)
 Chronic ongoing issue, s/p multiple specialist evaluation (urology, PM&R), treatment to date has not been effective.  He continues Lyrica  150mg  nightly through pain clinic.  He continues pyridium  200mg  PRN.  Uribel sample through urology didn't help.  Reassuring exam today.  Story/exam consistent with neuropathic pain, he describes h/o sore to penis a year ago that didn't respond to antifungal or steroid treatment - ?post-herpetic neuralgia. He notes initial response to dietary changes (stopping sodas, caffeine) however nota nymore - ?component of interstitial cystitis however UAs have been normal.   I touched base with PCP. Will add amitriptyline 10mg  nightly with option to increase to 20mg  nightly with drug precautions. Update PCP with effect.

## 2024-05-14 NOTE — Patient Instructions (Addendum)
 Urinalysis today  For penis pain - I will touch base with Dr Cleatus on possible trial of amitriptyline at night.  Knee is looking ok today - may continue tylenol , use knee sleeve brace for extra support . Try exercises provided today for medial collateral ligament sprain. May use topical voltaren over the counter three times daily as needed.

## 2024-05-14 NOTE — Assessment & Plan Note (Addendum)
 Update UA - normal. Continues PRN pyridium  with limited benefit.

## 2024-05-21 ENCOUNTER — Ambulatory Visit (INDEPENDENT_AMBULATORY_CARE_PROVIDER_SITE_OTHER): Admitting: Sleep Medicine

## 2024-05-21 ENCOUNTER — Encounter: Payer: Self-pay | Admitting: Sleep Medicine

## 2024-05-21 VITALS — BP 116/80 | HR 62 | Temp 97.7°F | Ht 69.0 in | Wt 205.2 lb

## 2024-05-21 DIAGNOSIS — G4733 Obstructive sleep apnea (adult) (pediatric): Secondary | ICD-10-CM

## 2024-05-21 DIAGNOSIS — I10 Essential (primary) hypertension: Secondary | ICD-10-CM

## 2024-05-21 NOTE — Patient Instructions (Signed)
 SABRA

## 2024-05-21 NOTE — Progress Notes (Signed)
 Name:Howard [name] MRN: [id number] DOB: [birth year]   CHIEF COMPLAINT:  REASSESSMENT OF OSA   HISTORY OF PRESENT ILLNESS: Howard [name] is a 73 y.o. w/ a h/o OSA, HTN and hyperlipidemia who presents for reassessment of OSA. Reports that he was initially diagnosed with OSA several years ago and was subsequently started on CPAP therapy. Reports using CPAP for several years and discontinued use due to discomfort. Reports loud snoring. Reports nocturnal awakenings due to nocturia, however does not have difficulty falling back to sleep. Denies any significant weight changes. Denies morning headaches, RLS symptoms, dream enactment, cataplexy, hypnagogic or hypnapompic hallucinations. Denies a family history of sleep apnea. Denies drowsy driving. Denies tobacco, alcohol or illicit drug use.   Bedtime 9:30-10 pm Sleep onset  5 mins Rise time 7-8 am   EPWORTH SLEEP SCORE 6    05/21/2024    9:00 AM 01/05/2017    1:00 PM  Results of the Epworth flowsheet  Sitting and reading 2 1  Watching TV 2 1  Sitting, inactive in a public place (e.g. a theatre or a meeting) 0 0  As a passenger in a car for an hour without a break 0 0  Lying down to rest in the afternoon when circumstances permit 2 1  Sitting and talking to someone 0 0  Sitting quietly after a lunch without alcohol 0 0  In a car, while stopped for a few minutes in traffic 0 0  Total score 6 3    PAST MEDICAL HISTORY :   has a past medical history of Anesthesia complication, Aortic atherosclerosis, Colon polyps, DVT (deep venous thrombosis) (HCC), ED (erectile dysfunction), Essential hypertension (06/25/2008), GERD (gastroesophageal reflux disease), HYPERLIPIDEMIA (07/24/2008), Hypersomnia (01/05/2017), Near syncope (03/14/2017), Neck pain (12/15/2011), OSA (obstructive sleep apnea) (11/13/2016), Prostate cancer (HCC), Sleep apnea (2018), and Thoracic ascending aortic aneurysm (04/17/2017).  has a past surgical history that includes  Esophagogastroduodenoscopy (10/02/2000); [facility]: Dehydration; vasvagal sync (12/09/ 05/2003); Plantar fascia surgery (Bilateral); Robot assisted laparoscopic radical prostatectomy (N/A, 05/27/2013); Colonoscopy; Polypectomy; and Upper gastrointestinal endoscopy. Prior to Admission medications   Medication Sig Start Date End Date Taking? Authorizing Provider  amitriptyline (ELAVIL) 10 MG tablet Take 1 tablet (10 mg total) by mouth at bedtime for 4 days, THEN 2 tablets (20 mg total) at bedtime. 05/14/24 06/17/24 Yes Rilla Baller, MD  amLODipine  (NORVASC ) 5 MG tablet Take 1 tablet (5 mg total) by mouth daily. 07/10/23  Yes Weaver, Scott T, PA-C  aspirin  EC 81 MG tablet Take 81 mg by mouth daily. Swallow whole.   Yes [provider]  Cholecalciferol 125 MCG (5000 UT) capsule Take 5,000 Units by mouth daily.   Yes [provider]  ezetimibe  (ZETIA ) 10 MG tablet TAKE 1 TABLET BY MOUTH EVERY DAY 04/09/24  Yes Jeffrie Oneil BROCKS, MD  metoprolol  succinate (TOPROL -XL) 25 MG 24 hr tablet TAKE 1 TABLET (25 MG TOTAL) BY MOUTH DAILY. 03/11/24  Yes Cleatus Arlyss RAMAN, MD  nitroGLYCERIN  (NITROSTAT ) 0.4 MG SL tablet Place 1 tablet (0.4 mg total) under the tongue every 5 (five) minutes as needed for chest pain. 04/24/23  Yes Weaver, Scott T, PA-C  Omeprazole -Sodium Bicarbonate (ZEGERID) 20-1100 MG CAPS capsule Take 1 capsule by mouth daily before breakfast.   Yes [provider]  phenazopyridine  (PYRIDIUM ) 200 MG tablet Take 1 tab 3 times a day if needed. 01/01/24  Yes Cleatus Arlyss RAMAN, MD  pregabalin  (LYRICA ) 150 MG capsule Take 1 capsule (150 mg total) by mouth  at bedtime. 04/29/24 05/29/24 Yes Tanya Glisson, MD  rosuvastatin  (CRESTOR ) 40 MG tablet Take 1 tablet (40 mg total) by mouth daily. 04/24/23  Yes Weaver, Scott T, PA-C   Allergies  Allergen Reactions   Ibuprofen     Eats a hole in the lining of his stomach   Gabapentin     confusion/altered speech on med    FAMILY HISTORY:  family  history includes Stroke in his mother. SOCIAL HISTORY:  reports that he has never smoked. He has never used smokeless tobacco. He reports that he does not drink alcohol and does not use drugs.   Review of Systems:  Gen:  Denies  fever, sweats, chills weight loss  HEENT: Denies blurred vision, double vision, ear pain, eye pain, hearing loss, nose bleeds, sore throat Cardiac:  No dizziness, chest pain or heaviness, chest tightness,edema, No JVD Resp:   No cough, -sputum production, -shortness of breath,-wheezing, -hemoptysis,  Gi: Denies swallowing difficulty, stomach pain, nausea or vomiting, diarrhea, constipation, bowel incontinence Gu:  Denies bladder incontinence, burning urine Ext:   Denies Joint pain, stiffness or swelling Skin: Denies  skin rash, easy bruising or bleeding or hives Endoc:  Denies polyuria, polydipsia , polyphagia or weight change Psych:   Denies depression, insomnia or hallucinations  Other:  All other systems negative  VITAL SIGNS: BP 116/80   Pulse 62   Temp 97.7 F (36.5 C)   Ht 5' 9 (1.753 m)   Wt 205 lb 3.2 oz (93.1 kg)   SpO2 96%   BMI 30.30 kg/m    Physical Examination:   General Appearance: No distress  EYES PERRLA, EOM intact.   NECK Supple, No JVD Pulmonary: normal breath sounds, No wheezing.  CardiovascularNormal S1,S2.  No m/r/g.   Abdomen: Benign, Soft, non-tender. Skin:   warm, no rashes, no ecchymosis  Extremities: normal, no cyanosis, clubbing. Neuro:without focal findings,  speech normal  PSYCHIATRIC: Mood, affect within normal limits.   ASSESSMENT AND PLAN  OSA Will reassess apnea with HST. Discussed the consequences of untreated sleep apnea. Advised not to drive drowsy for safety of patient and others. Will complete further evaluation with a home sleep study and follow up to review results.    HTN Stable, on current management. Following with PCP.    MEDICATION ADJUSTMENTS/LABS AND TESTS ORDERED: Recommend Sleep  Study   Patient  satisfied with Plan of action and management. All questions answered  Follow up to review HST results and treatment plan.   I spent a total of 33 minutes reviewing chart data, face-to-face evaluation with the patient, counseling and coordination of care as detailed above.    Severn Goddard, M.D.  Sleep Medicine [facility]

## 2024-05-26 NOTE — Progress Notes (Unsigned)
 PROVIDER NOTE: Interpretation of information contained herein should be left to medically-trained personnel. Specific patient instructions are provided elsewhere under Patient Instructions section of medical record. This document was created in part using AI and STT-dictation technology, any transcriptional errors that may result from this process are unintentional.  Patient: Howard [name]  Service: E/M   PCP: Howard Arlyss RAMAN, MD  DOB: October 31, 1950  DOS: 05/27/2024  Provider: Eric DELENA Como, MD  MRN: [id number]  Delivery: Virtual Visit  Specialty: Interventional Pain Management  Type: Established Patient  Setting: Ambulatory outpatient facility  Specialty designation: 09  Referring Prov.: Howard Arlyss RAMAN, MD  Location: Remote location       Virtual Encounter - Pain Management PROVIDER NOTE: Information contained herein reflects review and annotations entered in association with encounter. Interpretation of such information and data should be left to medically-trained personnel. Information provided to patient can be located elsewhere in the medical record under Patient Instructions. Document created using STT-dictation technology, any transcriptional errors that may result from process are unintentional.    Contact & Pharmacy Preferred: [phone] Home: [phone] (home) Mobile: [phone] (mobile) E-mail: [name]  [id number] [place], [place] - [address] [place] [address] Phone: [phone] Fax: [phone]   Pre-screening  Howard [name] offered in-person vs virtual encounter. He indicated preferring virtual for this encounter.   Reason COVID-19*  Social distancing based on CDC and AMA recommendations.   I contacted Howard [name] on 05/27/2024 via telephone.      I clearly identified myself as Howard DELENA Como, MD. I verified that I was speaking with the correct person using two identifiers (Name:  Howard [name], and date of birth: 1951/02/14).  Consent I sought verbal advanced consent from Howard [name] for virtual visit interactions. I informed Howard [name] of possible security and privacy concerns, risks, and limitations associated with providing not-in-person medical evaluation and management services. I also informed Howard [name] of the availability of in-person appointments. Finally, I informed him that there would be a charge for the virtual visit and that he could be  personally, fully or partially, financially responsible for it. Howard [name] expressed understanding and agreed to proceed.   Historic Elements   Howard [name] is a 73 y.o. year old, male patient evaluated today after our last contact on 04/29/2024. Howard [name]  has a past medical history of Anesthesia complication, Aortic atherosclerosis, Colon polyps, DVT (deep venous thrombosis) (HCC), ED (erectile dysfunction), Essential hypertension (06/25/2008), GERD (gastroesophageal reflux disease), HYPERLIPIDEMIA (07/24/2008), Hypersomnia (01/05/2017), Near syncope (03/14/2017), Neck pain (12/15/2011), OSA (obstructive sleep apnea) (11/13/2016), Prostate cancer [facility]), Sleep apnea (2018), and Thoracic ascending aortic aneurysm (04/17/2017). He also  has a past surgical history that includes Esophagogastroduodenoscopy (10/02/2000); MCH: Dehydration; vasvagal sync (12/09/ 05/2003); Plantar fascia surgery (Bilateral); Robot assisted laparoscopic radical prostatectomy (N/A, 05/27/2013); Colonoscopy; Polypectomy; and Upper gastrointestinal endoscopy. Howard [name] has a current medication list which includes the following prescription(s): amitriptyline, amlodipine , aspirin  ec, cholecalciferol, ezetimibe , metoprolol  succinate, nitroglycerin , omeprazole -sodium bicarbonate, phenazopyridine , pregabalin , and rosuvastatin . He  reports that he has never smoked. He has never used smokeless tobacco. He reports that he does not drink alcohol and does  not use drugs. Howard [name] is allergic to ibuprofen and gabapentin.  BMI: Estimated body mass index is 30.3 kg/m as calculated from the following:   Height as of 05/21/24: 5' 9 (1.753 m).   Weight as of 05/21/24: 205 lb 3.2 oz (93.1 kg). Last encounter: 04/29/2024. Last procedure: Visit  date not found.  HPI  Today, he is being contacted for medication management.  Patient started on pregabalin  (Lyrica ) which has been slowly titrated up in an effort to curve he is intermittent chronic pain.  Currently he should be at 150 mg capsule at bedtime.  If he continues to experience pain but has been getting some degree of benefit, we may continue to increase the dose as tolerated to a maximum of 450 mg/day.  If by any chance he encounters side effects that a lower dose, then we will try to stay below that dose as long as this is providing some benefit.  Otherwise, we will simply just titrate the dose down until we can stop it.   ***   Pharmacotherapy Assessment  Opioid Analgesic: No chronic opioid analgesics therapy prescribed by our practice. None MME/day: 0 mg/day   Monitoring: New Site PMP: PDMP not reviewed this encounter.       Pharmacotherapy: No side-effects or adverse reactions reported. Compliance: No problems identified. Effectiveness: Clinically acceptable. Plan: Refer to POC.  UDS:  Summary  Date Value Ref Range Status  02/21/2024 FINAL  Final    Comment:    ==================================================================== Compliance Drug Analysis, Ur ==================================================================== Test                             Result       Flag       Units  Drug Present and Declared for Prescription Verification   Metoprolol                      PRESENT      EXPECTED  Drug Present not Declared for Prescription Verification   Acetaminophen                   PRESENT      UNEXPECTED  Drug Absent but Declared for Prescription Verification   Pregabalin                       Not Detected UNEXPECTED   Cyclobenzaprine                 Not Detected UNEXPECTED ==================================================================== Test                      Result    Flag   Units      Ref Range   Creatinine              207              mg/dL      >=79 ==================================================================== Declared Medications:  The flagging and interpretation on this report are based on the  following declared medications.  Unexpected results may arise from  inaccuracies in the declared medications.   **Note: The testing scope of this panel includes these medications:   Cyclobenzaprine  (Flexeril )  Metoprolol  (Toprol )  Pregabalin  (Lyrica )   **Note: The testing scope of this panel does not include the  following reported medications:   Amlodipine  (Norvasc )  Cholecalciferol  Ezetimibe  (Zetia )  Nitroglycerin  (Nitrostat )  Omeprazole  (Zegerid)  Phenazopyridine  (Pyridium )  Polymyxin B  (Polytrim )  Rosuvastatin  (Crestor )  Trimethoprim  (Polytrim ) ==================================================================== For clinical consultation, please call [phone]. ====================================================================    No results found for: CBDTHCR, D8THCCBX, D9THCCBX  Laboratory Chemistry Profile   Renal Lab Results  Component Value Date   BUN 20 01/19/2024   CREATININE 1.54 (H) 01/19/2024   BCR 13  01/19/2024   GFR 56.55 (L) 07/28/2023   GFRAA 68 03/02/2020   GFRNONAA 53 (L) 08/21/2022    Hepatic Lab Results  Component Value Date   AST 20 07/28/2023   ALT 9 07/28/2023   ALBUMIN 3.9 07/28/2023   ALKPHOS 79 07/28/2023   HCVAB NEGATIVE 08/27/2015    Electrolytes Lab Results  Component Value Date   NA 139 01/19/2024   K 5.2 01/19/2024   CL 101 01/19/2024   CALCIUM  9.9 01/19/2024   MG 2.2 01/19/2024    Bone Lab Results  Component Value Date   VD25OH 45.07 12/09/2021   25OHVITD1 33  02/21/2024   25OHVITD2 <1.0 02/21/2024   25OHVITD3 33 02/21/2024    Inflammation (CRP: Acute Phase) (ESR: Chronic Phase) Lab Results  Component Value Date   CRP <1 02/21/2024   ESRSEDRATE 12 02/21/2024         Note: Above Lab results reviewed.  Imaging  CT ANGIO CHEST AORTA W/CM & OR WO/CM CLINICAL DATA:  Follow-up thoracic aortic aneurysm.  EXAM: CT ANGIOGRAPHY CHEST WITH CONTRAST  TECHNIQUE: Multidetector CT imaging of the chest was performed using the standard protocol during bolus administration of intravenous contrast. Multiplanar CT image reconstructions and MIPs were obtained to evaluate the vascular anatomy.  RADIATION DOSE REDUCTION: This exam was performed according to the departmental dose-optimization program which includes automated exposure control, adjustment of the mA and/or kV according to patient size and/or use of iterative reconstruction technique.  CONTRAST:  75mL OMNIPAQUE  IOHEXOL  350 MG/ML SOLN  COMPARISON:  11/24/2022  FINDINGS: Cardiovascular: Stable 4.1 cm ascending thoracic aortic aneurysm. No evidence of aortic dissection. Aortic and coronary atherosclerotic calcification incidentally noted. No pulmonary emboli identified.  Mediastinum/Nodes: No masses or pathologically enlarged lymph nodes identified.  Lungs/Pleura: No pulmonary mass, infiltrate, or effusion. Azygous fissure again noted.  Upper abdomen: Multiple gallstones seen, without signs of cholecystitis.  Musculoskeletal: No suspicious bone lesions identified.  Review of the MIP images confirms the above findings.  IMPRESSION: Stable 4.1 cm ascending thoracic aortic aneurysm. Recommend annual imaging followup by CTA or MRA. This recommendation follows 2010 ACCF/AHA/AATS/ACR/ASA/SCA/SCAI/SIR/STS/SVM Guidelines for the Diagnosis and Management of Patients with Thoracic Aortic Disease. Circulation. 2010; 121: Z733-z630. Aortic aneurysm NOS (ICD10-I71.9)  Cholelithiasis. No  radiographic evidence of cholecystitis.  Electronically Signed   By: Norleen DELENA Kil M.D.   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 Diagnoses of Chronic pain syndrome, Neurogenic pain, Neuropathic pain, History of prostate cancer, History of penile lesion, History of prostatectomy, and Pharmacologic therapy were also pertinent to this visit.  Plan of Care  Problem-specific:  No problem-specific Assessment & Plan notes found for this encounter.  Mr. Danarius Mcconathy has a current medication list which includes the following long-term medication(s): amitriptyline, amlodipine , ezetimibe , metoprolol  succinate, nitroglycerin , omeprazole -sodium bicarbonate, pregabalin , and rosuvastatin .  Pharmacotherapy (Medications Ordered): No orders of the defined types were placed in this encounter.  Orders:  No orders of the defined types were placed in this encounter.  Follow-up plan:   No follow-ups on file.      Interventional Therapies  Risk Factors  Considerations  Medical Comorbidities:     Planned  Pending:      Under consideration:      Completed: (Analgesic benefit)1  (02/21/2024) patient started on a pregabalin  (Lyrica ) titration    Therapeutic  Palliative (PRN) options:   None   Completed by other providers:   None reported  1(Analgesic benefit): Expressed in percentage (%). (  Local anesthetic[LA] +/- sedation  L.A.Local Anesthetic  Steroid benefit  Ongoing benefit)      Recent Visits Date Type Provider Dept  04/29/24 Office Visit Tanya Glisson, MD [facility]  04/10/24 Office Visit Tanya Glisson, MD [facility]  03/26/24 Office Visit Tanya Glisson, MD [facility]  03/11/24 Office Visit Tanya Glisson, MD [facility]  Showing recent visits within past 90 days and meeting all other requirements Future Appointments Date Type Provider Dept  05/27/24 Office  Visit Tanya Glisson, MD [facility]  Showing future appointments within next 90 days and meeting all other requirements  I discussed the assessment and treatment plan with the patient. The patient was provided an opportunity to ask questions and all were answered. The patient agreed with the plan and demonstrated an understanding of the instructions.  Patient advised to call back or seek an in-person evaluation if the symptoms or condition worsens.  Duration of encounter: *** minutes.  Note by: Glisson DELENA Tanya, MD Date: 05/27/2024; Time: 2:42 PM

## 2024-05-27 ENCOUNTER — Ambulatory Visit: Attending: Pain Medicine | Admitting: Pain Medicine

## 2024-05-27 ENCOUNTER — Encounter: Payer: Self-pay | Admitting: Pain Medicine

## 2024-05-27 VITALS — BP 133/87 | HR 63 | Temp 97.5°F | Resp 16 | Ht 69.0 in | Wt 205.0 lb

## 2024-05-27 DIAGNOSIS — N489 Disorder of penis, unspecified: Secondary | ICD-10-CM | POA: Diagnosis present

## 2024-05-27 DIAGNOSIS — M792 Neuralgia and neuritis, unspecified: Secondary | ICD-10-CM | POA: Insufficient documentation

## 2024-05-27 DIAGNOSIS — G8929 Other chronic pain: Secondary | ICD-10-CM | POA: Insufficient documentation

## 2024-05-27 DIAGNOSIS — G894 Chronic pain syndrome: Secondary | ICD-10-CM | POA: Diagnosis present

## 2024-05-27 DIAGNOSIS — Z8546 Personal history of malignant neoplasm of prostate: Secondary | ICD-10-CM | POA: Insufficient documentation

## 2024-05-27 DIAGNOSIS — N4889 Other specified disorders of penis: Secondary | ICD-10-CM | POA: Diagnosis present

## 2024-05-27 DIAGNOSIS — Z79899 Other long term (current) drug therapy: Secondary | ICD-10-CM | POA: Diagnosis present

## 2024-05-27 DIAGNOSIS — Z9079 Acquired absence of other genital organ(s): Secondary | ICD-10-CM | POA: Insufficient documentation

## 2024-05-27 NOTE — Progress Notes (Signed)
 Safety precautions to be maintained throughout the outpatient stay will include: orient to surroundings, keep bed in low position, maintain call bell within reach at all times, provide assistance with transfer out of bed and ambulation.

## 2024-05-29 ENCOUNTER — Encounter

## 2024-05-29 DIAGNOSIS — G4733 Obstructive sleep apnea (adult) (pediatric): Secondary | ICD-10-CM

## 2024-06-05 ENCOUNTER — Other Ambulatory Visit: Payer: Self-pay | Admitting: Family Medicine

## 2024-06-06 NOTE — Telephone Encounter (Signed)
 Pt has had multiple recent acute appts but last f/u was on 05/11/23 and no future appts., will route to PCP for review

## 2024-06-07 ENCOUNTER — Other Ambulatory Visit: Payer: Self-pay | Admitting: Physician Assistant

## 2024-06-07 NOTE — Telephone Encounter (Signed)
 Rx sent.  Did it help with patient's symptoms?  Please let me know.  Thanks.

## 2024-06-07 NOTE — Telephone Encounter (Signed)
 Noted, thanks.  Please have him check with the pain clinic to see about options.  Thanks.

## 2024-06-07 NOTE — Telephone Encounter (Signed)
 Spoke with pt notifying him refill sent in. Pt states amitriptyline helps but still having penile pain. Says pain subsided some after having a BM.

## 2024-06-09 ENCOUNTER — Other Ambulatory Visit: Payer: Self-pay | Admitting: Family Medicine

## 2024-06-09 DIAGNOSIS — I10 Essential (primary) hypertension: Secondary | ICD-10-CM

## 2024-06-10 ENCOUNTER — Ambulatory Visit: Payer: Self-pay

## 2024-06-10 DIAGNOSIS — G4733 Obstructive sleep apnea (adult) (pediatric): Secondary | ICD-10-CM | POA: Diagnosis not present

## 2024-06-10 NOTE — Telephone Encounter (Signed)
 Spoke with pt relaying Dr Elfredia message. Pt states he saw pain clinic a little after Dr Cleatus prescribed amitriptyline. Says she was told that what they were prescribing wasn't really helping, didn't prescribe anything else and did not mention to continue amitriptyline or not.

## 2024-06-10 NOTE — Telephone Encounter (Signed)
 I need input from the pain clinic about options.  Can the patient check with them about treatment going forward?

## 2024-06-11 ENCOUNTER — Encounter: Payer: Self-pay | Admitting: Pharmacist

## 2024-06-11 NOTE — Progress Notes (Signed)
 Pharmacy Quality Measure Review  This patient is appearing on a report for being at risk of failing the adherence measure for cholesterol (statin) medications this calendar year.   Medication: rosuvastatin  40 mg Last fill date: 01/10/24 for 90 day supply  Insurance report was not up to date. No action needed at this time.  Medication refill has been sent as of 06/11/24. Medication has been filled at the pharmacy, pending patient pickup.

## 2024-06-11 NOTE — Telephone Encounter (Signed)
 Spoke with pt relaying Dr Elfredia message. Verbalizes understanding and will contact pain clinic tomorrow.

## 2024-06-11 NOTE — Telephone Encounter (Signed)
 E-scribed refill.   Pls schedule annual exam and fasting labs for additional refills.

## 2024-06-14 ENCOUNTER — Ambulatory Visit (INDEPENDENT_AMBULATORY_CARE_PROVIDER_SITE_OTHER): Admitting: Sleep Medicine

## 2024-06-14 ENCOUNTER — Encounter: Payer: Self-pay | Admitting: Sleep Medicine

## 2024-06-14 VITALS — BP 120/80 | HR 63 | Temp 97.9°F | Ht 69.0 in | Wt 202.6 lb

## 2024-06-14 DIAGNOSIS — G4733 Obstructive sleep apnea (adult) (pediatric): Secondary | ICD-10-CM | POA: Diagnosis not present

## 2024-06-14 DIAGNOSIS — I10 Essential (primary) hypertension: Secondary | ICD-10-CM

## 2024-06-14 NOTE — Progress Notes (Signed)
 Name:Howard [name] MRN: [id number] DOB: April 24, 1951   CHIEF COMPLAINT:  CPAP F/U   HISTORY OF PRESENT ILLNESS: Mr. Cicero is a 73 y.o. w/ a h/o OSA, HTN and hyperlipidemia who presents to follow up on HST results. The patient underwent HST for reassessment of OSA, which revealed severe OSA (AHI 45, O2 nadir 84%).    EPWORTH SLEEP SCORE 6    05/21/2024    9:00 AM 01/05/2017    1:00 PM  Results of the Epworth flowsheet  Sitting and reading 2 1  Watching TV 2 1  Sitting, inactive in a public place (e.g. a theatre or a meeting) 0 0  As a passenger in a car for an hour without a break 0 0  Lying down to rest in the afternoon when circumstances permit 2 1  Sitting and talking to someone 0 0  Sitting quietly after a lunch without alcohol 0 0  In a car, while stopped for a few minutes in traffic 0 0  Total score 6 3    PAST MEDICAL HISTORY :   has a past medical history of Anesthesia complication, Aortic atherosclerosis, Colon polyps, DVT (deep venous thrombosis) (HCC), ED (erectile dysfunction), Essential hypertension (06/25/2008), GERD (gastroesophageal reflux disease), HYPERLIPIDEMIA (07/24/2008), Hypersomnia (01/05/2017), Near syncope (03/14/2017), Neck pain (12/15/2011), OSA (obstructive sleep apnea) (11/13/2016), Prostate cancer (HCC), Sleep apnea (2018), and Thoracic ascending aortic aneurysm (04/17/2017).  has a past surgical history that includes Esophagogastroduodenoscopy (10/02/2000); MCH: Dehydration; vasvagal sync (12/09/ 05/2003); Plantar fascia surgery (Bilateral); Robot assisted laparoscopic radical prostatectomy (N/A, 05/27/2013); Colonoscopy; Polypectomy; and Upper gastrointestinal endoscopy. Prior to Admission medications   Medication Sig Start Date End Date Taking? Authorizing Provider  amitriptyline (ELAVIL) 10 MG tablet Take 1 tablet (10 mg total) by mouth at bedtime for 4 days, THEN 2 tablets (20 mg total) at bedtime. 05/14/24 06/17/24 Yes Rilla Baller,  MD  amLODipine  (NORVASC ) 5 MG tablet Take 1 tablet (5 mg total) by mouth daily. 07/10/23  Yes Lelon Hamilton T, PA-C  aspirin  EC 81 MG tablet Take 81 mg by mouth daily. Swallow whole.   Yes [provider]  Cholecalciferol 125 MCG (5000 UT) capsule Take 5,000 Units by mouth daily.   Yes [provider]  ezetimibe  (ZETIA ) 10 MG tablet TAKE 1 TABLET BY MOUTH EVERY DAY 04/09/24  Yes Jeffrie Oneil BROCKS, MD  metoprolol  succinate (TOPROL -XL) 25 MG 24 hr tablet TAKE 1 TABLET (25 MG TOTAL) BY MOUTH DAILY. 03/11/24  Yes Cleatus Arlyss RAMAN, MD  nitroGLYCERIN  (NITROSTAT ) 0.4 MG SL tablet Place 1 tablet (0.4 mg total) under the tongue every 5 (five) minutes as needed for chest pain. 04/24/23  Yes Weaver, Scott T, PA-C  Omeprazole -Sodium Bicarbonate (ZEGERID) 20-1100 MG CAPS capsule Take 1 capsule by mouth daily before breakfast.   Yes [provider]  phenazopyridine  (PYRIDIUM ) 200 MG tablet Take 1 tab 3 times a day if needed. 01/01/24  Yes Cleatus Arlyss RAMAN, MD  pregabalin  (LYRICA ) 150 MG capsule Take 1 capsule (150 mg total) by mouth at bedtime. 04/29/24 05/29/24 Yes Tanya Glisson, MD  rosuvastatin  (CRESTOR ) 40 MG tablet Take 1 tablet (40 mg total) by mouth daily. 04/24/23  Yes Weaver, Scott T, PA-C   Allergies  Allergen Reactions   Ibuprofen     Eats a hole in the lining of his stomach   Gabapentin     confusion/altered speech on med    FAMILY HISTORY:  family history includes Stroke in his mother.  SOCIAL HISTORY:  reports that he has never smoked. He has never used smokeless tobacco. He reports that he does not drink alcohol and does not use drugs.   Review of Systems:  Gen:  Denies  fever, sweats, chills weight loss  HEENT: Denies blurred vision, double vision, ear pain, eye pain, hearing loss, nose bleeds, sore throat Cardiac:  No dizziness, chest pain or heaviness, chest tightness,edema, No JVD Resp:   No cough, -sputum production, -shortness of breath,-wheezing,  -hemoptysis,  Gi: Denies swallowing difficulty, stomach pain, nausea or vomiting, diarrhea, constipation, bowel incontinence Gu:  Denies bladder incontinence, burning urine Ext:   Denies Joint pain, stiffness or swelling Skin: Denies  skin rash, easy bruising or bleeding or hives Endoc:  Denies polyuria, polydipsia , polyphagia or weight change Psych:   Denies depression, insomnia or hallucinations  Other:  All other systems negative  VITAL SIGNS: BP 120/80   Pulse 63   Temp 97.9 F (36.6 C)   Ht 5' 9 (1.753 m)   Wt 202 lb 9.6 oz (91.9 kg)   SpO2 95%   BMI 29.92 kg/m    Physical Examination:   General Appearance: No distress  EYES PERRLA, EOM intact.   NECK Supple, No JVD Pulmonary: normal breath sounds, No wheezing.  CardiovascularNormal S1,S2.  No m/r/g.   Abdomen: Benign, Soft, non-tender. Skin:   warm, no rashes, no ecchymosis  Extremities: normal, no cyanosis, clubbing. Neuro:without focal findings,  speech normal  PSYCHIATRIC: Mood, affect within normal limits.   ASSESSMENT AND PLAN  OSA Reviewed HST results with patient. Will complete order for new CPAP device. Discussed the consequences of untreated sleep apnea. Advised not to drive drowsy for safety of patient and others. Will follow up in 3 months.    HTN Stable, on current management. Following with PCP.    Patient  satisfied with Plan of action and management. All questions answered  I spent a total of 27 minutes reviewing chart data, face-to-face evaluation with the patient, counseling and coordination of care as detailed above.    Abbe Bula, M.D.  Sleep Medicine [facility]

## 2024-06-20 ENCOUNTER — Telehealth: Payer: Self-pay

## 2024-06-20 NOTE — Telephone Encounter (Signed)
 Copied from CRM #[id number]. Topic: Clinical - Order For Equipment >> Jun 20, 2024  2:56 PM Howard [name] wrote: Reason for CRM: Pt stated he contacted [facility] and they advised him to call the office to find out what type of mask he needed, pt stated he was unsure and would like a callback regarding this.  Called pt and informed him that per the DME order, he is supposed to have Airtouch N30i nasal mask. Pt verbalized understanding, NFN

## 2024-06-21 ENCOUNTER — Other Ambulatory Visit

## 2024-06-26 ENCOUNTER — Other Ambulatory Visit: Payer: Self-pay | Admitting: Family Medicine

## 2024-06-26 DIAGNOSIS — Z125 Encounter for screening for malignant neoplasm of prostate: Secondary | ICD-10-CM

## 2024-06-26 DIAGNOSIS — I10 Essential (primary) hypertension: Secondary | ICD-10-CM

## 2024-06-28 ENCOUNTER — Other Ambulatory Visit (INDEPENDENT_AMBULATORY_CARE_PROVIDER_SITE_OTHER)

## 2024-06-28 DIAGNOSIS — Z125 Encounter for screening for malignant neoplasm of prostate: Secondary | ICD-10-CM

## 2024-06-28 DIAGNOSIS — I10 Essential (primary) hypertension: Secondary | ICD-10-CM

## 2024-06-28 LAB — LIPID PANEL
Cholesterol: 115 mg/dL (ref 0–200)
HDL: 37.4 mg/dL — ABNORMAL LOW (ref >39.00)
LDL Cholesterol: 48 mg/dL (ref 0–99)
NonHDL: 77.28
Total CHOL/HDL Ratio: 3
Triglycerides: 144 mg/dL (ref 0.0–149.0)
VLDL: 28.8 mg/dL (ref 0.0–40.0)

## 2024-06-28 LAB — COMPREHENSIVE METABOLIC PANEL WITH GFR
ALT: 10 U/L (ref 0–53)
AST: 20 U/L (ref 0–37)
Albumin: 4.3 g/dL (ref 3.5–5.2)
Alkaline Phosphatase: 80 U/L (ref 39–117)
BUN: 21 mg/dL (ref 6–23)
CO2: 31 meq/L (ref 19–32)
Calcium: 9.9 mg/dL (ref 8.4–10.5)
Chloride: 100 meq/L (ref 96–112)
Creatinine, Ser: 1.61 mg/dL — ABNORMAL HIGH (ref 0.40–1.50)
GFR: 41.87 mL/min — ABNORMAL LOW (ref >60.00)
Glucose, Bld: 89 mg/dL (ref 70–99)
Potassium: 4.4 meq/L (ref 3.5–5.1)
Sodium: 137 meq/L (ref 135–145)
Total Bilirubin: 0.9 mg/dL (ref 0.2–1.2)
Total Protein: 6.9 g/dL (ref 6.0–8.3)

## 2024-06-28 LAB — PSA, MEDICARE: PSA: 0 ng/mL — ABNORMAL LOW (ref 0.10–4.00)

## 2024-06-30 ENCOUNTER — Ambulatory Visit: Payer: Self-pay | Admitting: Family Medicine

## 2024-07-05 ENCOUNTER — Ambulatory Visit (INDEPENDENT_AMBULATORY_CARE_PROVIDER_SITE_OTHER): Admitting: Family Medicine

## 2024-07-05 ENCOUNTER — Encounter: Payer: Self-pay | Admitting: Family Medicine

## 2024-07-05 VITALS — BP 138/84 | HR 62 | Temp 97.9°F | Ht 68.0 in | Wt 201.1 lb

## 2024-07-05 DIAGNOSIS — I10 Essential (primary) hypertension: Secondary | ICD-10-CM

## 2024-07-05 DIAGNOSIS — Z7189 Other specified counseling: Secondary | ICD-10-CM

## 2024-07-05 DIAGNOSIS — K219 Gastro-esophageal reflux disease without esophagitis: Secondary | ICD-10-CM

## 2024-07-05 DIAGNOSIS — Z0000 Encounter for general adult medical examination without abnormal findings: Secondary | ICD-10-CM

## 2024-07-05 DIAGNOSIS — G4733 Obstructive sleep apnea (adult) (pediatric): Secondary | ICD-10-CM

## 2024-07-05 DIAGNOSIS — I2510 Atherosclerotic heart disease of native coronary artery without angina pectoris: Secondary | ICD-10-CM

## 2024-07-05 DIAGNOSIS — G588 Other specified mononeuropathies: Secondary | ICD-10-CM | POA: Diagnosis not present

## 2024-07-05 DIAGNOSIS — E785 Hyperlipidemia, unspecified: Secondary | ICD-10-CM

## 2024-07-05 MED ORDER — AMITRIPTYLINE HCL 10 MG PO TABS: 20.0000 mg | ORAL_TABLET | Freq: Every day | ORAL | Status: AC

## 2024-07-05 MED ORDER — AMLODIPINE BESYLATE 5 MG PO TABS: 5.0000 mg | ORAL_TABLET | Freq: Every day | ORAL | 3 refills | Status: AC

## 2024-07-05 MED ORDER — METOPROLOL SUCCINATE ER 25 MG PO TB24: 25.0000 mg | ORAL_TABLET | Freq: Every day | ORAL | 3 refills | Status: AC

## 2024-07-05 NOTE — Patient Instructions (Addendum)
 Please call GI when possible.  Take care.  Glad to see you. Please call about follow up with cardiology.   If you have more pain, then go to the ER.  Try taking 20-30 amitriptyline  per day.

## 2024-07-05 NOTE — Progress Notes (Unsigned)
 He has aorta monitoring per cardiology, d/w pt.    He is some better with amitriptyline  taken late at night, ie less penile pain.  Taking pyridium  less often with use.   D/w pt about trial of amitriptyline  prior to bed.  Discussed that he could gradually increase the dose if needed and tolerated.   PSA 2025 Colonoscopy 2020, d/w pt about follow up 2025.   Flu shot prev done.   PNA up to date.  Shingles and tetanus d/w pt.  Advance directive- wife designated if patient were incapacitated.   Diet and exercise d/w pt.     Elevated Cholesterol: Using medications without problems: yes Muscle aches: no Diet compliance: yes Exercise:yes, going to the gym.   Labs d/w pt.     GERD.  Controlled with zegerid. No ADE on med.  Compliant.   Hypertension:   Using medication without problems or lightheadedness: yes He had episode of NTG responsive CP/jaw pain.  This was last week.  No sx in the meantime.  He had finished working out at the gym about prior to symptoms.  He had been able to exercise w/o pain.    Edema:no Short of breath:no    Still using CPAP for OSA.  Sleeping well.  Compliant.  Cr elevation d/w pt. see following phone note.  PMH and SH reviewed  ROS: Per HPI unless specifically indicated in ROS section   Meds, vitals, and allergies reviewed.   GEN: nad, alert and oriented HEENT: mucous membranes moist NECK: supple w/o LA CV: rrr. PULM: ctab, no inc wob ABD: soft, +bs EXT: no edema SKIN: well perfused.

## 2024-07-07 ENCOUNTER — Telehealth: Payer: Self-pay | Admitting: Family Medicine

## 2024-07-07 DIAGNOSIS — I10 Essential (primary) hypertension: Secondary | ICD-10-CM

## 2024-07-07 NOTE — Assessment & Plan Note (Signed)
Continue Zegerid 

## 2024-07-07 NOTE — Assessment & Plan Note (Signed)
-   Continue metoprolol 

## 2024-07-07 NOTE — Assessment & Plan Note (Signed)
 Continue CPAP.

## 2024-07-07 NOTE — Assessment & Plan Note (Signed)
 He had episode of NTG responsive CP/jaw pain.  This was last week.  No sx in the meantime.  He had finished working out at the gym about prior to symptoms.  He had been able to exercise w/o pain.   I sent a note to cardiology in the meantime and I asked the patient to call about follow-up.  He can use nitroglycerin  in the future if he has another episode but I advised him if he has more pain to go to the emergency room.  He is pain-free now.  At this point okay for outpatient follow-up.

## 2024-07-07 NOTE — Assessment & Plan Note (Signed)
 PSA 2025 Colonoscopy 2020, d/w pt about follow up 2025.   Flu shot prev done.   PNA up to date.  Shingles and tetanus d/w pt.  Advance directive- wife designated if patient were incapacitated.   Diet and exercise d/w pt.

## 2024-07-07 NOTE — Telephone Encounter (Signed)
 Please check with patient and make sure he called about getting follow-up with cardiology.  We talked about his creatinine at the office visit but there were other issues for consideration, i.e. his follow-up with cardiology.  I would like to recheck his creatinine in about 1-2 weeks.  He needs a nonfasting lab visit.  Make sure he is well-hydrated when he comes in for labs.  I put in the follow-up orders.  Thanks.

## 2024-07-07 NOTE — Assessment & Plan Note (Signed)
 Advance directive- wife designated if patient were incapacitated.

## 2024-07-07 NOTE — Assessment & Plan Note (Signed)
Continue Zetia and Crestor

## 2024-07-07 NOTE — Assessment & Plan Note (Signed)
 He is some better with amitriptyline  taken late at night, ie less penile pain.  Taking pyridium  less often with use.   D/w pt about trial of amitriptyline  prior to bed.  Discussed that he could gradually increase the dose if needed and tolerated.  See after visit summary.

## 2024-07-08 NOTE — Telephone Encounter (Signed)
 Spoke with pt asking about cards f/u. Says he was was giving them until today to contact him to schedule f/u but he hasn't heard anything. Pt states he'll call tomorrow to get scheduled.   Also, pt scheduled non-fasting lab visit on 07/15/24 at 8:45 to recheck creatinine. Fyi to Dr Cleatus.

## 2024-07-09 NOTE — Telephone Encounter (Signed)
 Noted. Thanks.

## 2024-07-15 ENCOUNTER — Other Ambulatory Visit

## 2024-07-15 DIAGNOSIS — I10 Essential (primary) hypertension: Secondary | ICD-10-CM | POA: Diagnosis not present

## 2024-07-15 LAB — BASIC METABOLIC PANEL WITH GFR
BUN: 14 mg/dL (ref 6–23)
CO2: 31 meq/L (ref 19–32)
Calcium: 9.7 mg/dL (ref 8.4–10.5)
Chloride: 102 meq/L (ref 96–112)
Creatinine, Ser: 1.47 mg/dL (ref 0.40–1.50)
GFR: 46.68 mL/min — ABNORMAL LOW (ref >60.00)
Glucose, Bld: 106 mg/dL — ABNORMAL HIGH (ref 70–99)
Potassium: 4.7 meq/L (ref 3.5–5.1)
Sodium: 138 meq/L (ref 135–145)

## 2024-07-21 ENCOUNTER — Ambulatory Visit: Payer: Self-pay | Admitting: Family Medicine

## 2024-07-25 ENCOUNTER — Encounter: Payer: Self-pay | Admitting: *Deleted

## 2024-07-26 ENCOUNTER — Encounter: Payer: Self-pay | Admitting: *Deleted

## 2024-07-28 NOTE — Assessment & Plan Note (Signed)
 Thoracic aortic aneurysm measuring 4.1 cm on CT in Aug 2025. ***

## 2024-07-28 NOTE — Progress Notes (Unsigned)
 OFFICE NOTE:    Date:  07/29/2024  ID:  Howard [name], DOB 1951/02/17, MRN [id number] PCP: Cleatus Arlyss RAMAN, MD  [facility] Providers Cardiologist:  Oneil Parchment, MD Cardiology APP:  Lelon Hamilton T, PA-C        Coronary artery disease  Myoview  02/16/2018: No ischemia, EF 50 TTE 02/20/20: EF 60-65, no RWMA, mild LVH, GR 1 DD, normal RVSF, trivial MR, mild dilation of aortic root and ascending aorta (41 mm)  CCTA 11/24/2022: Ascending aorta dilation 4.1 cm; CAC score 1364 (95th percentile), moderate CAD (proximal RCA 25-49, proximal-mid LAD 50-69, LCx calcifications >> FFR normal-med Rx Thoracic aortic aneurysm CT Jul 21: 4.2 cm // CT 8/22: 4 cm // CT 4/24: 4.1 cm CT 03/2024: 4.1 cm  Atrial fibrillation - noted on stress test in 2019 Monitor 02/22/2018: NSR, freq PACs, brief atrial runs Hypertension  Aortic atherosclerosis  Carotid US  04/16/2019: No significant stenosis bilaterally ABIs 08/11/2017: Normal Hepatic steatosis (? LFTs)/NASH  R leg DVT 04/2023        Discussed the use of AI scribe software for clinical note transcription with the patient, who gave verbal consent to proceed. History of Present Illness Howard [name] is a 73 y.o. male who returns for evaluation of chest pain. He was last seen in 03/2024. He was seen by primary care recently and noted an episode of NTG responsive chest pain.   He experienced chest pain approximately three to four weeks ago after working out at [employer] The pain began after he left the gym and was accompanied by shortness of breath. It started in the center of his chest and radiated to the left side and up to his jaw. He took two nitroglycerin  tablets, which provided some relief, and the pain resolved after about 15 minutes. He rated the pain as a six or seven out of ten. No nausea, sweating, or palpitations during the episodes, but he reports occasional lightheadedness after taking nitroglycerin .    ROS-See HPI    Studies  Reviewed:  EKG Interpretation Date/Time:  Monday July 29 2024 09:10:45 EST Ventricular Rate:  62 PR Interval:  210 QRS Duration:  86 QT Interval:  390 QTC Calculation: 395 R Axis:   10  Text Interpretation: Sinus rhythm with 1st degree A-V block Nonspecific ST and T wave abnormality No significant change since last tracing Confirmed by Lelon Hamilton [id number]) on 07/29/2024 9:15:52 AM    LABS 06/28/24: ALT 10, TC 115, HDL 37.40, LDL 48, Trig 144 07/15/14: K 4.7, SCr 1.47, eGFR 46.68         Physical Exam:  VS:  BP 130/80 (BP Location: Right Arm, Patient Position: Sitting, Cuff Size: Normal)   Pulse 64   Ht 5' 9 (1.753 m)   Wt 203 lb 9.6 oz (92.4 kg)   SpO2 97%   BMI 30.07 kg/m        Wt Readings from Last 3 Encounters:  07/29/24 203 lb 9.6 oz (92.4 kg)  07/05/24 201 lb 2 oz (91.2 kg)  06/14/24 202 lb 9.6 oz (91.9 kg)    Constitutional:      Appearance: Healthy appearance. Not in distress.  Neck:     Vascular: JVD normal.  Pulmonary:     Breath sounds: Normal breath sounds. No wheezing. No rales.  Cardiovascular:     Normal rate. Regular rhythm.     Murmurs: There is no murmur.  Edema:    Peripheral edema absent.  Abdominal:  Palpations: Abdomen is soft.       Assessment and Plan:    Assessment & Plan Coronary artery disease involving native coronary artery of native heart with angina pectoris Precordial pain He has moderate non-obstructive CAD on CCTA in 11/2022 with mid LAD 50-69% stenosis. FFR was normal. He had a recent episode of chest discomfort 3 weeks ago seems consistent with angina, occurring post-exercise with relief from nitroglycerin  x 2. We discussed further testing to include nuclear stress testing, repeat CCTA and cardiac catheterization. Given his hx and recent symptoms, I favor cardiac catheterization. He would like to avoid this if possible. He has not really had a recurrence like he did 3 weeks ago. He had a very brief episode a few days ago.  With no escalation in symptoms, it is reasonable to pursue noninvasive testing first. Through shared decision making, we decided to pursue a nuclear stress test, adjust medical Rx and arrange early follow up. Of note, he does not take PDE-5 inhibitors.  - Order exercise SPECT MPI - Start Isosorbide  mononitrate 30 mg daily. - Continue ASA 81 mg daily, amlodipine  5 mg daily, Metoprolol  succinate 25 mg daily, rosuvastatin  40 mg daily, Zetia  10 mg daily. - Continue nitroglycerin  as needed. - Follow up in four weeks - If symptoms escalate or he has a change, would have a low threshold to proceed with cardiac catheterization. Aneurysm of ascending aorta without rupture Thoracic aortic aneurysm measuring 4.1 cm on CT in Aug 2025, stable over several years. - Continue annual surveillance with CT imaging. Essential hypertension Blood pressure is well-controlled with current medication regimen. - Continue amlodipine  5 mg daily, metoprolol  succinate 25 mg daily. Hyperlipidemia LDL goal <70 LDL cholesterol levels are optimal with current medication regimen. - Continue rosuvastatin  40 mg daily, Zetia  10 mg daily.     Informed Consent   Shared Decision Making/Informed Consent The risks [chest pain, shortness of breath, cardiac arrhythmias, dizziness, blood pressure fluctuations, myocardial infarction, stroke/transient ischemic attack, nausea, vomiting, allergic reaction, radiation exposure, metallic taste sensation and life-threatening complications (estimated to be 1 in 10,000)], benefits (risk stratification, diagnosing coronary artery disease, treatment guidance) and alternatives of a nuclear stress test were discussed in detail with Howard [name] and he agrees to proceed.      Dispo:  Return in about 4 weeks (around 08/26/2024) for follow up after testing, w/ Glendia Ferrier, PA-C.  Signed, Glendia Ferrier, PA-C

## 2024-07-28 NOTE — Assessment & Plan Note (Signed)
 He has moderate non-obstructive CAD on CCTA in 11/2022 with mid LAD 50-69% stenosis. FFR was normal. He had a recent episode of chest discomfort 3 weeks ago seems consistent with angina, occurring post-exercise with relief from nitroglycerin  x 2. We discussed further testing to include nuclear stress testing, repeat CCTA and cardiac catheterization. Given his hx and recent symptoms, I favor cardiac catheterization. He would like to avoid this if possible. He has not really had a recurrence like he did 3 weeks ago. He had a very brief episode a few days ago. With no escalation in symptoms, it is reasonable to pursue noninvasive testing first. Through shared decision making, we decided to pursue a nuclear stress test, adjust medical Rx and arrange early follow up. Of note, he does not take PDE-5 inhibitors.  - Order exercise SPECT MPI - Start Isosorbide  mononitrate 30 mg daily. - Continue ASA 81 mg daily, amlodipine  5 mg daily, Metoprolol  succinate 25 mg daily, rosuvastatin  40 mg daily, Zetia  10 mg daily. - Continue nitroglycerin  as needed. - Follow up in four weeks - If symptoms escalate or he has a change, would have a low threshold to proceed with cardiac catheterization.

## 2024-07-29 ENCOUNTER — Encounter: Payer: Self-pay | Admitting: Physician Assistant

## 2024-07-29 ENCOUNTER — Ambulatory Visit: Attending: Physician Assistant | Admitting: Physician Assistant

## 2024-07-29 VITALS — BP 130/80 | HR 64 | Ht 69.0 in | Wt 203.6 lb

## 2024-07-29 DIAGNOSIS — I10 Essential (primary) hypertension: Secondary | ICD-10-CM | POA: Diagnosis not present

## 2024-07-29 DIAGNOSIS — I7121 Aneurysm of the ascending aorta, without rupture: Secondary | ICD-10-CM

## 2024-07-29 DIAGNOSIS — I25119 Atherosclerotic heart disease of native coronary artery with unspecified angina pectoris: Secondary | ICD-10-CM

## 2024-07-29 DIAGNOSIS — R072 Precordial pain: Secondary | ICD-10-CM | POA: Diagnosis not present

## 2024-07-29 DIAGNOSIS — E785 Hyperlipidemia, unspecified: Secondary | ICD-10-CM

## 2024-07-29 MED ORDER — NITROGLYCERIN 0.4 MG SL SUBL: 0.4000 mg | SUBLINGUAL_TABLET | SUBLINGUAL | 11 refills | Status: AC | PRN

## 2024-07-29 MED ORDER — ISOSORBIDE MONONITRATE ER 30 MG PO TB24: 30.0000 mg | ORAL_TABLET | Freq: Every day | ORAL | 3 refills | Status: AC

## 2024-07-29 NOTE — Assessment & Plan Note (Addendum)
 He has moderate non-obstructive CAD on CCTA in 11/2022 with mid LAD 50-69% stenosis. FFR was normal. He had a recent episode of chest discomfort 3 weeks ago seems consistent with angina, occurring post-exercise with relief from nitroglycerin  x 2. We discussed further testing to include nuclear stress testing, repeat CCTA and cardiac catheterization. Given his hx and recent symptoms, I favor cardiac catheterization. He would like to avoid this if possible. He has not really had a recurrence like he did 3 weeks ago. He had a very brief episode a few days ago. With no escalation in symptoms, it is reasonable to pursue noninvasive testing first. Through shared decision making, we decided to pursue a nuclear stress test, adjust medical Rx and arrange early follow up. Of note, he does not take PDE-5 inhibitors.  - Order exercise SPECT MPI - Start Isosorbide  mononitrate 30 mg daily. - Continue ASA 81 mg daily, amlodipine  5 mg daily, Metoprolol  succinate 25 mg daily, rosuvastatin  40 mg daily, Zetia  10 mg daily. - Continue nitroglycerin  as needed. - Follow up in four weeks - If symptoms escalate or he has a change, would have a low threshold to proceed with cardiac catheterization.

## 2024-07-29 NOTE — Assessment & Plan Note (Signed)
 LDL cholesterol levels are optimal with current medication regimen. - Continue rosuvastatin  40 mg daily, Zetia  10 mg daily.

## 2024-07-29 NOTE — Patient Instructions (Signed)
 Medication Instructions:  START IMDUR  30 MG DAILY *If you need a refill on your cardiac medications before your next appointment, please call your pharmacy*  Lab Work: NO LABS If you have labs (blood work) drawn today and your tests are completely normal, you will receive your results only by: MyChart Message (if you have MyChart) OR A paper copy in the mail If you have any lab test that is abnormal or we need to change your treatment, we will call you to review the results.  Testing/Procedures: Your physician has requested that you have en exercise stress myoview . For further information please visit [email or web address] Please follow instructions given below.  Follow-Up: At [facility], you and your health needs are our priority.  As part of our continuing mission to provide you with exceptional heart care, our providers are all part of one team.  This team includes your primary Cardiologist (physician) and Advanced Practice Providers or APPs (Physician Assistants and Nurse Practitioners) who all work together to provide you with the care you need, when you need it.  Your next appointment:   4 week(s)  Provider:   Glendia Ferrier, PA-C   Other Instructions Your physician has requested that you have an Exercise Stress Test. An exercise tolerance test is a test to check how your heart works during exercise. You will need to walk on a treadmill for this test. An electrocardiogram (ECG) will record your heartbeat when you are at rest and when you are exercising.    Please arrive 15 minutes prior to your appointment time for registration and insurance purposes.   The test will take approximately 45 minutes to complete.   How to prepare for your Exercise Stress Test: Do bring a list of your current medications with you.  If not listed below, you may take your medications as normal. Do wear comfortable clothes (no dresses or overalls) and walking shoes, tennis shoes preferred (no  heels or open toed shoes are allowed) Do Not wear cologne, perfume, aftershave or lotions (deodorant is allowed). Do not drink or eat foods with caffeine for 24 hours before the test. (Chocolate, coffee, tea, or energy drinks) If you use an inhaler, bring it with you to the test. Do not smoke for 4 hours before the test.    If these instructions are not followed, your test will have to be rescheduled.   If you cannot keep your appointment, please provide 24 hours notification to our office, to avoid a possible $50 charge to your account.

## 2024-07-29 NOTE — Assessment & Plan Note (Signed)
 Blood pressure is well-controlled with current medication regimen. - Continue amlodipine  5 mg daily, metoprolol  succinate 25 mg daily.

## 2024-07-30 ENCOUNTER — Telehealth (HOSPITAL_COMMUNITY): Payer: Self-pay

## 2024-07-30 NOTE — Telephone Encounter (Signed)
 Spoke with the patient, detailed instructions given. S.Sueo Cullen CCT

## 2024-08-02 ENCOUNTER — Other Ambulatory Visit: Payer: Self-pay | Admitting: Physician Assistant

## 2024-08-02 DIAGNOSIS — I25119 Atherosclerotic heart disease of native coronary artery with unspecified angina pectoris: Secondary | ICD-10-CM

## 2024-08-02 DIAGNOSIS — R072 Precordial pain: Secondary | ICD-10-CM

## 2024-08-06 ENCOUNTER — Ambulatory Visit: Payer: Self-pay | Admitting: Physician Assistant

## 2024-08-06 ENCOUNTER — Ambulatory Visit (HOSPITAL_COMMUNITY)
Admission: RE | Admit: 2024-08-06 | Discharge: 2024-08-06 | Disposition: A | Discharge status: Home / Self Care | Source: Ambulatory Visit | Attending: Internal Medicine | Admitting: Internal Medicine

## 2024-08-06 DIAGNOSIS — R072 Precordial pain: Secondary | ICD-10-CM | POA: Insufficient documentation

## 2024-08-06 DIAGNOSIS — I25119 Atherosclerotic heart disease of native coronary artery with unspecified angina pectoris: Secondary | ICD-10-CM | POA: Diagnosis not present

## 2024-08-06 LAB — MYOCARDIAL PERFUSION IMAGING
Estimated workload: 7.6
Exercise duration (min): 1 min
LV dias vol: 93 mL (ref 62–150)
LV sys vol: 23 mL
MPHR: 146 {beats}/min
Nuc Stress EF: 75 %
Peak HR: 91 {beats}/min
Percent HR: 62 %
RPE: 19
Rest HR: 58 {beats}/min
Rest Nuclear Isotope Dose: 10.1 mCi
SDS: 0
SRS: 7
SSS: 3
ST Depression (mm): 0 mm
Stress Nuclear Isotope Dose: 32.2 mCi
TID: 0.81

## 2024-08-06 MED ORDER — REGADENOSON 0.4 MG/5ML IV SOLN
0.4000 mg | Freq: Once | INTRAVENOUS | Status: AC
  Administered 2024-08-06: 0.4 mg via INTRAVENOUS

## 2024-08-06 MED ORDER — TECHNETIUM TC 99M TETROFOSMIN IV KIT
32.2000 | PACK | Freq: Once | INTRAVENOUS | Status: AC | PRN
  Administered 2024-08-06: 32.2 via INTRAVENOUS

## 2024-08-06 MED ORDER — TECHNETIUM TC 99M TETROFOSMIN IV KIT
10.1000 | PACK | Freq: Once | INTRAVENOUS | Status: AC | PRN
  Administered 2024-08-06: 10.1 via INTRAVENOUS

## 2024-08-06 MED ORDER — REGADENOSON 0.4 MG/5ML IV SOLN
INTRAVENOUS | Status: AC
  Filled 2024-08-06: qty 5

## 2024-08-09 ENCOUNTER — Ambulatory Visit: Payer: Self-pay | Admitting: Physician Assistant

## 2024-08-12 NOTE — Progress Notes (Signed)
 Called and spoke with patient regarding his CT  results. Patient verbalized understanding.  Scott : Patient is scheduled for 10/15/24 because there were no other openings. Patient would like to know if he should keep this appointment or do you need him to be seen in 4 weeks, as you advised the patient during his last office visit? Please advise

## 2024-08-26 ENCOUNTER — Encounter: Payer: Self-pay | Admitting: *Deleted

## 2024-08-26 NOTE — Assessment & Plan Note (Signed)
 He has moderate non-obstructive CAD on CCTA in 11/2022 with mid LAD 50-69% stenosis. FFR was normal. He was seen last month after experiencing an episode of chest discomfort that sounded consistent with angina.  This was relieved with nitroglycerin .  I placed him on isosorbide .  Nuclear stress test demonstrated no ischemia.***

## 2024-08-26 NOTE — Assessment & Plan Note (Signed)
 Thoracic aortic aneurysm measuring 4.1 cm on CT in Aug 2025, stable over several years. - Repeat CT due in 03/2025.

## 2024-08-26 NOTE — Progress Notes (Unsigned)
 "      OFFICE NOTE:    Date:  08/27/2024  ID:  Howard [name], DOB [birth year], MRN [id number] PCP: Cleatus Arlyss RAMAN, MD  [facility] Providers Cardiologist:  Oneil Parchment, MD Cardiology APP:  Lelon Glendia DASEN, PA-C        Coronary artery disease  Myoview  02/16/2018: No ischemia, EF 50 TTE 02/20/20: EF 60-65, no RWMA, mild LVH, GR 1 DD, normal RVSF, trivial MR, mild dilation of aortic root and ascending aorta (41 mm)  CCTA 11/24/2022: Ascending aorta dilation 4.1 cm; CAC score 1364 (95th percentile), moderate CAD (proximal RCA 25-49, proximal-mid LAD 50-69, LCx calcifications >> FFR normal-med Rx Pharm MPI 08/06/24: No ischemia or infarction, EF 75, low risk  Thoracic aortic aneurysm CT Jul 21: 4.2 cm // CT 8/22: 4 cm // CT 4/24: 4.1 cm CT 03/2024: 4.1 cm  Atrial fibrillation - noted on stress test in 2019 Monitor 02/22/2018: NSR, freq PACs, brief atrial runs Hypertension  Hyperlipidemia  Aortic atherosclerosis  Carotid US  04/16/2019: No significant stenosis bilaterally ABIs 08/11/2017: Normal Hepatic steatosis (? LFTs)/NASH  R leg DVT 04/2023          Discussed the use of AI scribe software for clinical note transcription with the patient, who gave verbal consent to proceed. History of Present Illness Howard [name] is a 74 y.o. male for follow up of CAD, chest pain.  He was last seen 07/29/2024.  He has a history of coronary CTA in April 2024 with moderate disease.  There is suggestion of mid LAD stenosis 50-69%.  FFR was normal.  Calcium  score at that time was 1000 364.  When last seen, he had experienced an episode that seemed consistent with angina.  Symptoms were relieved by nitroglycerin .  We discussed proceeding with cardiac catheterization versus repeat coronary CTA.  He wanted to avoid this if at all possible and we decided to pursue a nuclear stress test.  He was started on isosorbide  30 mg daily.  Nuclear stress test demonstrated no ischemia or infarction.  He has  experienced no further chest discomfort since starting Imdur  30 mg daily.  No shortness of breath or syncope. Of note, the attenuation correction CT images from his stress test did show gallstones. However, he reports no symptoms such as abdominal pain, nausea, or vomiting.    ROS-See HPI    Studies Reviewed:               Physical Exam:  VS:  BP 126/82   Pulse (!) 58   Ht 5' 9 (1.753 m)   Wt 201 lb 9.6 oz (91.4 kg)   SpO2 95%   BMI 29.77 kg/m        Wt Readings from Last 3 Encounters:  08/27/24 201 lb 9.6 oz (91.4 kg)  07/29/24 203 lb 9.6 oz (92.4 kg)  07/05/24 201 lb 2 oz (91.2 kg)    Constitutional:      Appearance: Healthy appearance. Not in distress.  Neck:     Vascular: JVD normal.  Pulmonary:     Breath sounds: Normal breath sounds. No wheezing. No rales.  Cardiovascular:     Normal rate. Regular rhythm.     Murmurs: There is no murmur.  Edema:    Peripheral edema absent.  Abdominal:     Palpations: Abdomen is soft.       Assessment and Plan:    Assessment & Plan Coronary artery disease involving native coronary artery of native heart with angina  pectoris He has moderate non-obstructive CAD on CCTA in 11/2022 with mid LAD 50-69% stenosis. FFR was normal. He was seen last month after experiencing an episode of chest discomfort that sounded consistent with angina.  This was relieved with nitroglycerin .  I placed him on isosorbide .  Nuclear stress test demonstrated no ischemia. No further chest discomfort reported since starting on isosorbide .  Therefore, given low risk stress test and well-controlled symptoms, no further testing or intervention required at this time. - Continue Imdur  30 mg daily - Continue amlodipine  5 mg daily - Continue metoprolol  succinate 25 mg daily - Continue aspirin  81 mg daily - Continue nitroglycerin  as needed - Continue Crestor  40 mg daily - Follow up Sept 2026 Aneurysm of ascending aorta without rupture Thoracic aortic aneurysm  measuring 4.1 cm on CT in Aug 2025, stable over several years. - Repeat CT due in 03/2025.  Essential hypertension BP controlled. - Continue Imdur  30 mg daily - Continue amlodipine  5 mg daily - Continue metoprolol  succinate 25 mg daily Hyperlipidemia LDL goal <70 Lipids are well-managed with current medication regimen. Recent LDL in November was optimal. - Continue Crestor  40 mg daily         Dispo:  Return in about 8 months (around 04/27/2025) for Routine Follow Up, w/ Glendia Ferrier, PA-C.  Signed, Glendia Ferrier, PA-C   "

## 2024-08-27 ENCOUNTER — Ambulatory Visit: Attending: Physician Assistant | Admitting: Physician Assistant

## 2024-08-27 ENCOUNTER — Encounter: Payer: Self-pay | Admitting: Physician Assistant

## 2024-08-27 VITALS — BP 126/82 | HR 58 | Ht 69.0 in | Wt 201.6 lb

## 2024-08-27 DIAGNOSIS — I10 Essential (primary) hypertension: Secondary | ICD-10-CM | POA: Diagnosis not present

## 2024-08-27 DIAGNOSIS — E785 Hyperlipidemia, unspecified: Secondary | ICD-10-CM | POA: Diagnosis not present

## 2024-08-27 DIAGNOSIS — I25119 Atherosclerotic heart disease of native coronary artery with unspecified angina pectoris: Secondary | ICD-10-CM | POA: Diagnosis not present

## 2024-08-27 DIAGNOSIS — I7121 Aneurysm of the ascending aorta, without rupture: Secondary | ICD-10-CM | POA: Diagnosis not present

## 2024-08-27 NOTE — Assessment & Plan Note (Signed)
 BP controlled. - Continue Imdur  30 mg daily - Continue amlodipine  5 mg daily - Continue metoprolol  succinate 25 mg daily

## 2024-08-27 NOTE — Patient Instructions (Signed)
 Medication Instructions:  No medication changes were made during today's visit.  *If you need a refill on your cardiac medications before your next appointment, please call your pharmacy*   Lab Work: No labs were ordered during today's visit.  If you have labs (blood work) drawn today and your tests are completely normal, you will receive your results only by: MyChart Message (if you have MyChart) OR A paper copy in the mail If you have any lab test that is abnormal or we need to change your treatment, we will call you to review the results.   Testing/Procedures: No procedures were ordered during today's visit.   Your next appointment:   8 month(s)   Provider:   Glendia Ferrier, PA-C           Follow-Up: At [facility], you and your health needs are our priority.  As part of our continuing mission to provide you with exceptional heart care, we have created designated Provider Care Teams.  These Care Teams include your primary Cardiologist (physician) and Advanced Practice Providers (APPs -  Physician Assistants and Nurse Practitioners) who all work together to provide you with the care you need, when you need it. We recommend signing up for the patient portal called MyChart.  Sign up information is provided on this After Visit Summary.  MyChart is used to connect with patients for Virtual Visits (Telemedicine).  Patients are able to view lab/test results, encounter notes, upcoming appointments, etc.  Non-urgent messages can be sent to your provider as well.   To learn more about what you can do with MyChart, go to [email or web address].

## 2024-08-27 NOTE — Assessment & Plan Note (Signed)
 Lipids are well-managed with current medication regimen. Recent LDL in November was optimal. - Continue Crestor  40 mg daily

## 2024-09-04 ENCOUNTER — Ambulatory Visit

## 2024-09-04 VITALS — Ht 69.0 in | Wt 203.0 lb

## 2024-09-04 DIAGNOSIS — Z860100 Personal history of colon polyps, unspecified: Secondary | ICD-10-CM

## 2024-09-04 MED ORDER — NA SULFATE-K SULFATE-MG SULF 17.5-3.13-1.6 GM/177ML PO SOLN: 1.0000 | Freq: Once | ORAL | 0 refills | Status: AC

## 2024-09-04 NOTE — Progress Notes (Signed)
 Pt's name and DOB verified at the beginning of the pre-visit with 2 identifiers  Permission given to speak with  Pt denies any difficulty with ambulating,sitting, laying down or rolling side to side  Pt has no issues moving head neck or swallowing  No egg or soy allergy  known to patient   No issues known to pt with past sedation  No FH of Malignant Hyperthermia  Pt is not on home 02   Pt is not on blood thinners    Pt has frequent issues with constipation RN instructed pt to use Miralax per bottles instructions a week before prep days. Pt states they will  Pt is not on dialysis  Pt aneurysm of heart and hx of  Afib  Pt denies any upcoming cardiac testing   Chart not reviewed by CRNA prior to PV  Visit in person  Pt states weight is   Pt given  both [facility] main # and MD on call # prior to instructions.  Informed pt to come in at the time discussed and is shown on PV instructions.  Pt instructed to use Singlecare.com or GoodRx for a price reduction on prep  Instructed pt where to find PV instructions in My Chart.and a copy given to pt. Instructed pt on all aspects of written instructions including med holds clothing to wear and foods to eat and not eat as well as after procedure legal restrictions and to call MD on call if needed.. Pt states understanding. Instructed pt to review instructions again prior to procedure and call main # given if has any questions or any issues. Pt states they will.

## 2024-09-09 ENCOUNTER — Encounter: Payer: Self-pay | Admitting: Gastroenterology

## 2024-09-16 ENCOUNTER — Ambulatory Visit: Admitting: Sleep Medicine

## 2024-09-20 ENCOUNTER — Ambulatory Visit: Admitting: Gastroenterology

## 2024-09-20 ENCOUNTER — Encounter: Payer: Self-pay | Admitting: Gastroenterology

## 2024-09-20 VITALS — BP 107/60 | HR 49 | Temp 97.6°F | Resp 16 | Ht 69.0 in | Wt 203.0 lb

## 2024-09-20 DIAGNOSIS — K641 Second degree hemorrhoids: Secondary | ICD-10-CM

## 2024-09-20 DIAGNOSIS — D175 Benign lipomatous neoplasm of intra-abdominal organs: Secondary | ICD-10-CM

## 2024-09-20 DIAGNOSIS — Z860100 Personal history of colon polyps, unspecified: Secondary | ICD-10-CM

## 2024-09-20 DIAGNOSIS — K5730 Diverticulosis of large intestine without perforation or abscess without bleeding: Secondary | ICD-10-CM

## 2024-09-20 DIAGNOSIS — D120 Benign neoplasm of cecum: Secondary | ICD-10-CM

## 2024-09-20 MED ORDER — SODIUM CHLORIDE 0.9 % IV SOLN: 500.0000 mL | Freq: Once | INTRAVENOUS | Status: DC

## 2024-09-20 NOTE — Op Note (Signed)
 [facility] Patient Name: Howard [name] Procedure Date: 09/20/2024 10:23 AM MRN: [id number] Endoscopist: Sandor Flatter , MD, [id number] Age: 74 Referring MD:  Date of Birth: [birth year] Gender: Male Account #: [id number] Procedure:                Colonoscopy Indications:              Surveillance: Personal history of adenomatous                            polyps on last colonoscopy 5 years ago                           Last colonoscopy was 06/2019 and notable for 3                            subcentimeter adenomas, 15 mm transverse colon                            lipoma, internal hemorrhoids, with recommendation                            repeat in 3 years. Prior to that, colonoscopy in                            2019 with 17 mm descending colon tubulovillous                            adenoma and 10 mm sigmoid colon adenoma, and a few                            subcentimeter adenomas from the right colon. Medicines:                Monitored Anesthesia Care Procedure:                Pre-Anesthesia Assessment:                           - Prior to the procedure, a History and Physical                            was performed, and patient medications and                            allergies were reviewed. The patient's tolerance of                            previous anesthesia was also reviewed. The risks                            and benefits of the procedure and the sedation                            options and risks were discussed with the patient.  All questions were answered, and informed consent                            was obtained. Prior Anticoagulants: The patient has                            taken no anticoagulant or antiplatelet agents. ASA                            Grade Assessment: II - A patient with mild systemic                            disease. After reviewing the risks and benefits,                            the patient was  deemed in satisfactory condition to                            undergo the procedure.                           After obtaining informed consent, the colonoscope                            was passed under direct vision. Throughout the                            procedure, the patient's blood pressure, pulse, and                            oxygen saturations were monitored continuously. The                            Olympus Scope SN: [id number] was introduced through                            the anus and advanced to the the cecum, identified                            by appendiceal orifice and ileocecal valve. The                            colonoscopy was performed without difficulty. The                            patient tolerated the procedure well. The quality                            of the bowel preparation was good. The ileocecal                            valve, appendiceal orifice, and rectum were  photographed. Scope In: 10:33:00 AM Scope Out: 10:47:12 AM Scope Withdrawal Time: 0 hours 9 minutes 40 seconds  Total Procedure Duration: 0 hours 14 minutes 12 seconds  Findings:                 The perianal and digital rectal examinations were                            normal.                           Two sessile polyps were found in the cecum. The                            polyps were 2 to 4 mm in size. These polyps were                            removed with a cold snare. Resection and retrieval                            were complete. Estimated blood loss was minimal.                           Multiple small-mouthed diverticula were found in                            the sigmoid colon.                           There was a lipoma, 15 mm in diameter, in the                            transverse colon.                           Non-bleeding internal hemorrhoids were found during                            retroflexion. The hemorrhoids were small  and Grade                            II (internal hemorrhoids that prolapse but reduce                            spontaneously). Complications:            No immediate complications. Estimated Blood Loss:     Estimated blood loss was minimal. Impression:               - Two 2 to 4 mm polyps in the cecum, removed with a                            cold snare. Resected and retrieved.                           - Diverticulosis in the sigmoid colon.                           -  Lipoma in the transverse colon.                           - Non-bleeding internal hemorrhoids. Recommendation:           - Patient has a contact number available for                            emergencies. The signs and symptoms of potential                            delayed complications were discussed with the                            patient. Return to normal activities tomorrow.                            Written discharge instructions were provided to the                            patient.                           - Resume previous diet.                           - Continue present medications.                           - Await pathology results.                           - Repeat colonoscopy for surveillance based on                            pathology results.                           - Return to GI clinic PRN. Sandor Flatter, MD 09/20/2024 10:52:10 AM

## 2024-09-20 NOTE — Progress Notes (Signed)
 "   GASTROENTEROLOGY PROCEDURE H&P NOTE   Primary Care Physician: Cleatus Arlyss RAMAN, MD    Reason for Procedure:  Colon polyp surveillance  Plan:    Colonoscopy  Patient is appropriate for endoscopic procedure(s) in the ambulatory ([facility]) setting.  The nature of the procedure, as well as the risks, benefits, and alternatives were carefully and thoroughly reviewed with the patient. Ample time for discussion and questions allowed. The patient understood, was satisfied, and agreed to proceed. I personally addressed all patient questions and concerns.     HPI: Howard [name] is a 74 y.o. male who presents for colonoscopy for ongoing colon polyp surveillance and colon cancer screening.  No active GI symptoms.  No known family history of colon cancer or related malignancy.  Patient is otherwise without complaints or active issues today.  Last colonoscopy was 06/2019 and notable for 3 subcentimeter adenomas, 15 mm transverse colon lipoma, internal hemorrhoids, with recommendation repeat in 3 years.  Prior to that, colonoscopy in 2019 with 17 mm descending colon tubulovillous adenoma and 10 mm sigmoid colon adenoma, and a few subcentimeter adenomas from the right colon.  Past Medical History:  Diagnosis Date   Afib [facility])    Anesthesia complication    prolonged sedation after colonoscopy   Aortic atherosclerosis    CT 02/2020   Colon polyps    on colonoscopy 08/2010   DVT (deep venous thrombosis) [facility])    ED (erectile dysfunction)    Essential hypertension 06/25/2008   Qualifier: Diagnosis of  By: Bartley MD, Lamar Mulch    GERD (gastroesophageal reflux disease)    HYPERLIPIDEMIA 07/24/2008   Qualifier: Diagnosis of  By: Bartley MD, Lamar Mulch    Hypersomnia 01/05/2017   Near syncope 03/14/2017   Neck pain 12/15/2011   OSA (obstructive sleep apnea) 11/13/2016   Dr. Jude   Prostate cancer [facility])    s/p prostatectomy   Sleep apnea 2018   Thoracic ascending aortic aneurysm  04/17/2017   Chest CTA 10/18: Aorta 4.1 cm// Echo 8/18:  - 4.3 cm. Aortic arch is 3.3 cm // CT 01/2019: aorta 3.8 cm; sinuses of Valsalva 4.5 cm // Chest CTA 7/21: TAA 4.2 cm, cholelithiasis, aortic atherosclerosis // Chest CTA 8/22: Ascending thoracic aorta 4 cm, coronary calcification, cholelithiasis, aortic atherosclerosis//Chest CTA 8/22: Ascending thoracic aorta 4 cm    Past Surgical History:  Procedure Laterality Date   COLONOSCOPY     ESOPHAGOGASTRODUODENOSCOPY  10/02/2000   dilated reflux esopsh [facility]   [facility]: Dehydration; vasvagal sync  12/09/ 05/2003   PLANTAR FASCIA SURGERY Bilateral    POLYPECTOMY     ROBOT ASSISTED LAPAROSCOPIC RADICAL PROSTATECTOMY N/A 05/27/2013   Procedure: ROBOTIC ASSISTED LAPAROSCOPIC RADICAL PROSTATECTOMY LEVEL 1;  Surgeon: Noretta Ferrara, MD;  Location: [facility];  Service: Urology;  Laterality: N/A;   UPPER GASTROINTESTINAL ENDOSCOPY      Prior to Admission medications  Medication Sig Start Date End Date Taking? Authorizing Provider  amitriptyline  (ELAVIL ) 10 MG tablet Take 2-3 tablets (20-30 mg total) by mouth at bedtime. 07/05/24  Yes Cleatus Arlyss RAMAN, MD  amLODipine  (NORVASC ) 5 MG tablet Take 1 tablet (5 mg total) by mouth daily. 07/05/24 10/03/24 Yes Cleatus Arlyss RAMAN, MD  aspirin  EC 81 MG tablet Take 81 mg by mouth daily. Swallow whole.   Yes [provider]  Cholecalciferol 125 MCG (5000 UT) capsule Take 5,000 Units by mouth daily.   Yes [provider]  docusate (COLACE) 60 MG/15ML syrup Take 60 mg by mouth  daily.   Yes [provider]  ezetimibe  (ZETIA ) 10 MG tablet TAKE 1 TABLET BY MOUTH EVERY DAY 04/09/24  Yes Jeffrie Oneil BROCKS, MD  isosorbide  mononitrate (IMDUR ) 30 MG 24 hr tablet Take 1 tablet (30 mg total) by mouth daily. 07/29/24  Yes Lelon Hamilton T, PA-C  metoprolol  succinate (TOPROL -XL) 25 MG 24 hr tablet Take 1 tablet (25 mg total) by mouth daily. 07/05/24  Yes Cleatus Arlyss RAMAN, MD  Omeprazole -Sodium Bicarbonate (ZEGERID)  20-1100 MG CAPS capsule Take 1 capsule by mouth daily before breakfast.   Yes [provider]  rosuvastatin  (CRESTOR ) 40 MG tablet TAKE 1 TABLET BY MOUTH EVERY DAY 06/11/24  Yes Jeffrie Oneil BROCKS, MD  nitroGLYCERIN  (NITROSTAT ) 0.4 MG SL tablet Place 1 tablet (0.4 mg total) under the tongue every 5 (five) minutes as needed for chest pain. 07/29/24   Lelon Hamilton T, PA-C  phenazopyridine  (PYRIDIUM ) 200 MG tablet Take 1 tab 3 times a day if needed. Patient taking differently: as needed. Take 1 tab 3 times a day if needed. 01/01/24   Cleatus Arlyss RAMAN, MD    Current Outpatient Medications  Medication Sig Dispense Refill   amitriptyline  (ELAVIL ) 10 MG tablet Take 2-3 tablets (20-30 mg total) by mouth at bedtime.     amLODipine  (NORVASC ) 5 MG tablet Take 1 tablet (5 mg total) by mouth daily. 90 tablet 3   aspirin  EC 81 MG tablet Take 81 mg by mouth daily. Swallow whole.     Cholecalciferol 125 MCG (5000 UT) capsule Take 5,000 Units by mouth daily.     docusate (COLACE) 60 MG/15ML syrup Take 60 mg by mouth daily.     ezetimibe  (ZETIA ) 10 MG tablet TAKE 1 TABLET BY MOUTH EVERY DAY 90 tablet 3   isosorbide  mononitrate (IMDUR ) 30 MG 24 hr tablet Take 1 tablet (30 mg total) by mouth daily. 90 tablet 3   metoprolol  succinate (TOPROL -XL) 25 MG 24 hr tablet Take 1 tablet (25 mg total) by mouth daily. 90 tablet 3   Omeprazole -Sodium Bicarbonate (ZEGERID) 20-1100 MG CAPS capsule Take 1 capsule by mouth daily before breakfast.     rosuvastatin  (CRESTOR ) 40 MG tablet TAKE 1 TABLET BY MOUTH EVERY DAY 90 tablet 2   nitroGLYCERIN  (NITROSTAT ) 0.4 MG SL tablet Place 1 tablet (0.4 mg total) under the tongue every 5 (five) minutes as needed for chest pain. 25 tablet 11   phenazopyridine  (PYRIDIUM ) 200 MG tablet Take 1 tab 3 times a day if needed. (Patient taking differently: as needed. Take 1 tab 3 times a day if needed.) 30 tablet 1   Current Facility-Administered Medications  Medication Dose Route Frequency  Provider Last Rate Last Admin   0.9 %  sodium chloride  infusion  500 mL Intravenous Once Matthe Sloane V, DO        Allergies as of 09/20/2024 - Review Complete 09/20/2024  Allergen Reaction Noted   Ibuprofen  01/13/2011   Gabapentin  04/04/2023    Family History  Problem Relation Age of Onset   Stroke Mother    Prostate cancer Neg Hx    Colon cancer Neg Hx    Aortic dissection Neg Hx    Esophageal cancer Neg Hx    Colon polyps Neg Hx    Rectal cancer Neg Hx    Stomach cancer Neg Hx     Social History   Socioeconomic History   Marital status: Married    Spouse name: Not on file   Number of children: 3  Years of education: 63   Highest education level: Not on file  Occupational History   Occupation: Retired    Comment: Hospital Doctor Dept   Occupation: Hospital Doctor    Comment: [employer] in [employer]  Tobacco Use   Smoking status: Never   Smokeless tobacco: Never  Vaping Use   Vaping status: Never Used  Substance and Sexual Activity   Alcohol use: No    Alcohol/week: 0.0 standard drinks of alcohol    Comment: no   Drug use: No   Sexual activity: Not on file  Other Topics Concern   Not on file  Social History Narrative   Retired [employer], parttime bailiff- laid off as of 2018.    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     Married 1976, lives with wife. 1 step daughter, 2 daughters and 1 son.   [place] [employer] fan   Left handed   Social Drivers of Health   Tobacco Use: Low Risk (09/20/2024)   Patient History    Smoking Tobacco Use: Never    Smokeless Tobacco Use: Never    Passive Exposure: Not on file  Financial Resource Strain: Low Risk (05/09/2024)   Overall Financial Resource Strain (CARDIA)    Difficulty of Paying Living Expenses: Not hard at all  Food Insecurity: No Food Insecurity (05/09/2024)   Epic    Worried About Radiation Protection Practitioner of Food in the Last Year: Never true    Ran Out of Food in the Last Year: Never true  Transportation Needs: No  Transportation Needs (05/09/2024)   Epic    Lack of Transportation (Medical): No    Lack of Transportation (Non-Medical): No  Physical Activity: Insufficiently Active (05/09/2024)   Exercise Vital Sign    Days of Exercise per Week: 3 days    Minutes of Exercise per Session: 40 min  Stress: No Stress Concern Present (05/09/2024)   [employer] of Occupational Health - Occupational Stress Questionnaire    Feeling of Stress: Not at all  Social Connections: Moderately Integrated (05/09/2024)   Social Connection and Isolation Panel    Frequency of Communication with Friends and Family: More than three times a week    Frequency of Social Gatherings with Friends and Family: More than three times a week    Attends Religious Services: More than 4 times per year    Active Member of Clubs or Organizations: No    Attends Banker Meetings: Never    Marital Status: Married  Catering Manager Violence: Not At Risk (05/09/2024)   Epic    Fear of Current or Ex-Partner: No    Emotionally Abused: No    Physically Abused: No    Sexually Abused: No  Depression (PHQ2-9): Low Risk (05/27/2024)   Depression (PHQ2-9)    PHQ-2 Score: 0  Alcohol Screen: Low Risk (05/09/2024)   Alcohol Screen    Last Alcohol Screening Score (AUDIT): 0  Housing: Low Risk (05/09/2024)   Epic    Unable to Pay for Housing in the Last Year: No    Number of Times Moved in the Last Year: 0    Homeless in the Last Year: No  Utilities: Not At Risk (05/09/2024)   Epic    Threatened with loss of utilities: No  Health Literacy: Adequate Health Literacy (05/09/2024)   B1300 Health Literacy    Frequency of need for help with medical instructions: Never    Physical Exam: Vital signs in last 24 hours: @BP  111/70  Pulse (!) 53   Temp 97.6 F (36.4 C)   Resp 10   Ht 5' 9 (1.753 m)   Wt 203 lb (92.1 kg)   SpO2 96%   BMI 29.98 kg/m  GEN: NAD EYE: Sclerae anicteric ENT: MMM CV: Non-tachycardic Pulm: CTA b/l GI:  Soft, NT/ND NEURO:  Alert & Oriented x 3   Sandor Flatter, DO [facility]   09/20/2024 10:23 AM  "

## 2024-09-20 NOTE — Progress Notes (Signed)
 Report to PACU, RN, vss, BBS= Clear.

## 2024-09-20 NOTE — Progress Notes (Signed)
 Called to room to assist during endoscopic procedure.  Patient ID and intended procedure confirmed with present staff. Received instructions for my participation in the procedure from the performing physician.

## 2024-09-20 NOTE — Patient Instructions (Signed)
" °  Handouts provided about diverticulosis, hemorrhoids and polyps.  Resume previous diet.  Continue present medications.  Await pathology results.  Repeat colonoscopy for surveillance based on pathology results.  Return to GI clinic PRN.  YOU HAD AN ENDOSCOPIC PROCEDURE TODAY AT THE [facility]:   Refer to the procedure report that was given to you for any specific questions about what was found during the examination.  If the procedure report does not answer your questions, please call your gastroenterologist to clarify.  If you requested that your care partner not be given the details of your procedure findings, then the procedure report has been included in a sealed envelope for you to review at your convenience later.  YOU SHOULD EXPECT: Some feelings of bloating in the abdomen. Passage of more gas than usual.  Walking can help get rid of the air that was put into your GI tract during the procedure and reduce the bloating. If you had a lower endoscopy (such as a colonoscopy or flexible sigmoidoscopy) you may notice spotting of blood in your stool or on the toilet paper. If you underwent a bowel prep for your procedure, you may not have a normal bowel movement for a few days.  Please Note:  You might notice some irritation and congestion in your nose or some drainage.  This is from the oxygen used during your procedure.  There is no need for concern and it should clear up in a day or so.  SYMPTOMS TO REPORT IMMEDIATELY:  Following lower endoscopy (colonoscopy or flexible sigmoidoscopy):  Excessive amounts of blood in the stool  Significant tenderness or worsening of abdominal pains  Swelling of the abdomen that is new, acute  Fever of 100F or higher  For urgent or emergent issues, a gastroenterologist can be reached at any hour by calling [phone]. Do not use MyChart messaging for urgent concerns.    DIET:  We do recommend a small meal at first, but then you may  proceed to your regular diet.  Drink plenty of fluids but you should avoid alcoholic beverages for 24 hours.  ACTIVITY:  You should plan to take it easy for the rest of today and you should NOT DRIVE or use heavy machinery until tomorrow (because of the sedation medicines used during the test).    FOLLOW UP: Our staff will call the number listed on your records the next business day following your procedure.  We will call around 7:15- 8:00 am to check on you and address any questions or concerns that you may have regarding the information given to you following your procedure. If we do not reach you, we will leave a message.     If any biopsies were taken you will be contacted by phone or by letter within the next 1-3 weeks.  Please call us  at [phone] if you have not heard about the biopsies in 3 weeks.    SIGNATURES/CONFIDENTIALITY: You and/or your care partner have signed paperwork which will be entered into your electronic medical record.  These signatures attest to the fact that that the information above on your After Visit Summary has been reviewed and is understood.  Full responsibility of the confidentiality of this discharge information lies with you and/or your care-partner. "

## 2024-09-20 NOTE — Progress Notes (Signed)
 Pt's states no medical or surgical changes since previsit or office visit.

## 2024-09-25 ENCOUNTER — Ambulatory Visit: Admitting: Sleep Medicine

## 2024-10-15 ENCOUNTER — Ambulatory Visit: Admitting: Physician Assistant

## 2025-05-20 ENCOUNTER — Ambulatory Visit

## 2025-05-21 ENCOUNTER — Ambulatory Visit
